# Patient Record
Sex: Female | Born: 1940 | Race: White | Hispanic: No | Marital: Married | Smoking: Former smoker
Health system: Southern US, Community
[De-identification: ages and names within clinical notes are randomized; demographics above are authoritative.]

## PROBLEM LIST (undated history)

## (undated) DIAGNOSIS — K5790 Diverticulosis of intestine, part unspecified, without perforation or abscess without bleeding: Secondary | ICD-10-CM

## (undated) DIAGNOSIS — J449 Chronic obstructive pulmonary disease, unspecified: Secondary | ICD-10-CM

## (undated) DIAGNOSIS — I2510 Atherosclerotic heart disease of native coronary artery without angina pectoris: Secondary | ICD-10-CM

## (undated) DIAGNOSIS — I10 Essential (primary) hypertension: Secondary | ICD-10-CM

## (undated) DIAGNOSIS — R911 Solitary pulmonary nodule: Secondary | ICD-10-CM

## (undated) HISTORY — DX: Essential (primary) hypertension: I10

## (undated) HISTORY — PX: CARDIAC CATHETERIZATION: SHX172

## (undated) HISTORY — PX: CORONARY ANGIOPLASTY: SHX604

## (undated) HISTORY — PX: FRACTURE SURGERY: SHX138

## (undated) HISTORY — DX: Solitary pulmonary nodule: R91.1

## (undated) HISTORY — PX: TONSILLECTOMY AND ADENOIDECTOMY: SUR1326

## (undated) HISTORY — DX: Diverticulosis of intestine, part unspecified, without perforation or abscess without bleeding: K57.90

---

## 2011-10-27 DIAGNOSIS — I10 Essential (primary) hypertension: Secondary | ICD-10-CM | POA: Diagnosis not present

## 2011-12-28 DIAGNOSIS — Z1231 Encounter for screening mammogram for malignant neoplasm of breast: Secondary | ICD-10-CM | POA: Diagnosis not present

## 2012-03-20 DIAGNOSIS — S199XXA Unspecified injury of neck, initial encounter: Secondary | ICD-10-CM | POA: Diagnosis not present

## 2012-03-20 DIAGNOSIS — S2239XA Fracture of one rib, unspecified side, initial encounter for closed fracture: Secondary | ICD-10-CM | POA: Diagnosis not present

## 2012-03-20 DIAGNOSIS — S2249XA Multiple fractures of ribs, unspecified side, initial encounter for closed fracture: Secondary | ICD-10-CM | POA: Diagnosis not present

## 2012-03-20 DIAGNOSIS — R079 Chest pain, unspecified: Secondary | ICD-10-CM | POA: Diagnosis not present

## 2012-03-20 DIAGNOSIS — J984 Other disorders of lung: Secondary | ICD-10-CM | POA: Diagnosis not present

## 2012-03-20 DIAGNOSIS — S0993XA Unspecified injury of face, initial encounter: Secondary | ICD-10-CM | POA: Diagnosis not present

## 2012-03-20 DIAGNOSIS — J9383 Other pneumothorax: Secondary | ICD-10-CM | POA: Diagnosis not present

## 2012-03-20 DIAGNOSIS — S0990XA Unspecified injury of head, initial encounter: Secondary | ICD-10-CM | POA: Diagnosis not present

## 2012-03-20 DIAGNOSIS — R911 Solitary pulmonary nodule: Secondary | ICD-10-CM | POA: Diagnosis not present

## 2012-03-20 DIAGNOSIS — S270XXA Traumatic pneumothorax, initial encounter: Secondary | ICD-10-CM | POA: Diagnosis not present

## 2012-03-20 DIAGNOSIS — T148XXA Other injury of unspecified body region, initial encounter: Secondary | ICD-10-CM | POA: Diagnosis not present

## 2012-03-20 DIAGNOSIS — T07XXXA Unspecified multiple injuries, initial encounter: Secondary | ICD-10-CM | POA: Diagnosis not present

## 2012-03-21 DIAGNOSIS — R918 Other nonspecific abnormal finding of lung field: Secondary | ICD-10-CM | POA: Diagnosis not present

## 2012-03-21 DIAGNOSIS — S2249XA Multiple fractures of ribs, unspecified side, initial encounter for closed fracture: Secondary | ICD-10-CM | POA: Diagnosis not present

## 2012-03-21 DIAGNOSIS — S6990XA Unspecified injury of unspecified wrist, hand and finger(s), initial encounter: Secondary | ICD-10-CM | POA: Diagnosis not present

## 2012-03-21 DIAGNOSIS — S270XXA Traumatic pneumothorax, initial encounter: Secondary | ICD-10-CM | POA: Diagnosis not present

## 2012-03-22 DIAGNOSIS — J90 Pleural effusion, not elsewhere classified: Secondary | ICD-10-CM | POA: Diagnosis not present

## 2012-03-22 DIAGNOSIS — J9383 Other pneumothorax: Secondary | ICD-10-CM | POA: Diagnosis not present

## 2012-03-22 DIAGNOSIS — S2239XA Fracture of one rib, unspecified side, initial encounter for closed fracture: Secondary | ICD-10-CM | POA: Diagnosis not present

## 2012-03-22 DIAGNOSIS — J189 Pneumonia, unspecified organism: Secondary | ICD-10-CM | POA: Diagnosis not present

## 2012-03-22 DIAGNOSIS — R918 Other nonspecific abnormal finding of lung field: Secondary | ICD-10-CM | POA: Diagnosis not present

## 2012-03-23 DIAGNOSIS — J9819 Other pulmonary collapse: Secondary | ICD-10-CM | POA: Diagnosis not present

## 2012-03-23 DIAGNOSIS — J90 Pleural effusion, not elsewhere classified: Secondary | ICD-10-CM | POA: Diagnosis not present

## 2012-03-24 DIAGNOSIS — J90 Pleural effusion, not elsewhere classified: Secondary | ICD-10-CM | POA: Diagnosis not present

## 2012-03-24 DIAGNOSIS — J9819 Other pulmonary collapse: Secondary | ICD-10-CM | POA: Diagnosis not present

## 2012-03-24 DIAGNOSIS — J13 Pneumonia due to Streptococcus pneumoniae: Secondary | ICD-10-CM | POA: Diagnosis not present

## 2012-03-25 DIAGNOSIS — J90 Pleural effusion, not elsewhere classified: Secondary | ICD-10-CM | POA: Diagnosis not present

## 2012-03-26 DIAGNOSIS — S2249XA Multiple fractures of ribs, unspecified side, initial encounter for closed fracture: Secondary | ICD-10-CM | POA: Diagnosis not present

## 2012-03-26 DIAGNOSIS — J90 Pleural effusion, not elsewhere classified: Secondary | ICD-10-CM | POA: Diagnosis not present

## 2012-03-29 DIAGNOSIS — J9819 Other pulmonary collapse: Secondary | ICD-10-CM | POA: Diagnosis not present

## 2012-03-29 DIAGNOSIS — J90 Pleural effusion, not elsewhere classified: Secondary | ICD-10-CM | POA: Diagnosis not present

## 2012-03-29 DIAGNOSIS — S2239XA Fracture of one rib, unspecified side, initial encounter for closed fracture: Secondary | ICD-10-CM | POA: Diagnosis not present

## 2012-03-29 DIAGNOSIS — J13 Pneumonia due to Streptococcus pneumoniae: Secondary | ICD-10-CM | POA: Diagnosis not present

## 2012-03-29 DIAGNOSIS — J9383 Other pneumothorax: Secondary | ICD-10-CM | POA: Diagnosis not present

## 2012-04-16 DIAGNOSIS — R911 Solitary pulmonary nodule: Secondary | ICD-10-CM | POA: Diagnosis not present

## 2012-04-16 DIAGNOSIS — R079 Chest pain, unspecified: Secondary | ICD-10-CM | POA: Diagnosis not present

## 2012-04-16 DIAGNOSIS — M25473 Effusion, unspecified ankle: Secondary | ICD-10-CM | POA: Diagnosis not present

## 2012-05-13 ENCOUNTER — Encounter (HOSPITAL_COMMUNITY): Payer: Self-pay

## 2012-05-13 ENCOUNTER — Encounter (HOSPITAL_COMMUNITY)
Admission: RE | Admit: 2012-05-13 | Discharge: 2012-05-13 | Disposition: A | Discharge status: Home / Self Care | Payer: Medicare Other | Source: Ambulatory Visit | Attending: Internal Medicine | Admitting: Internal Medicine

## 2012-05-13 DIAGNOSIS — I2510 Atherosclerotic heart disease of native coronary artery without angina pectoris: Secondary | ICD-10-CM | POA: Insufficient documentation

## 2012-05-13 DIAGNOSIS — Z5189 Encounter for other specified aftercare: Secondary | ICD-10-CM | POA: Diagnosis not present

## 2012-05-13 HISTORY — DX: Atherosclerotic heart disease of native coronary artery without angina pectoris: I25.10

## 2012-05-13 NOTE — Progress Notes (Signed)
Erin Li came in today for orientation to Pulmonary Rehab.  Health history and medications were reviewed with patient and her husband.  Goals were set for rehab. Demonstration and practice of PLB using pulse oximeter.  Patient able to return demonstration satisfactorily. Safety and hand hygiene in the exercise area reviewed with patient.  Patient voices understanding.  She has follow up visit with Dr. Michaell Li at [facility] in [facility] tomorrow and plans to start exercise on 05/16/12.

## 2012-05-14 ENCOUNTER — Encounter (HOSPITAL_COMMUNITY): Payer: Medicare Other

## 2012-05-14 DIAGNOSIS — S2249XA Multiple fractures of ribs, unspecified side, initial encounter for closed fracture: Secondary | ICD-10-CM | POA: Diagnosis not present

## 2012-05-14 DIAGNOSIS — F411 Generalized anxiety disorder: Secondary | ICD-10-CM | POA: Diagnosis not present

## 2012-05-14 DIAGNOSIS — J9383 Other pneumothorax: Secondary | ICD-10-CM | POA: Diagnosis not present

## 2012-05-14 DIAGNOSIS — S8010XA Contusion of unspecified lower leg, initial encounter: Secondary | ICD-10-CM | POA: Diagnosis not present

## 2012-05-16 ENCOUNTER — Encounter (HOSPITAL_COMMUNITY): Payer: Medicare Other

## 2012-05-16 ENCOUNTER — Encounter (HOSPITAL_COMMUNITY)
Admission: RE | Admit: 2012-05-16 | Discharge: 2012-05-16 | Disposition: A | Discharge status: Home / Self Care | Payer: Medicare Other | Source: Ambulatory Visit | Attending: Internal Medicine | Admitting: Internal Medicine

## 2012-05-16 DIAGNOSIS — Z5189 Encounter for other specified aftercare: Secondary | ICD-10-CM | POA: Diagnosis not present

## 2012-05-16 DIAGNOSIS — I2510 Atherosclerotic heart disease of native coronary artery without angina pectoris: Secondary | ICD-10-CM | POA: Diagnosis not present

## 2012-05-20 ENCOUNTER — Encounter (HOSPITAL_COMMUNITY): Payer: Medicare Other

## 2012-05-21 ENCOUNTER — Encounter (HOSPITAL_COMMUNITY): Payer: Medicare Other

## 2012-05-21 ENCOUNTER — Encounter (HOSPITAL_COMMUNITY)
Admission: RE | Admit: 2012-05-21 | Discharge: 2012-05-21 | Disposition: A | Discharge status: Home / Self Care | Payer: Medicare Other | Source: Ambulatory Visit | Attending: Internal Medicine | Admitting: Internal Medicine

## 2012-05-21 DIAGNOSIS — I2510 Atherosclerotic heart disease of native coronary artery without angina pectoris: Secondary | ICD-10-CM | POA: Diagnosis not present

## 2012-05-21 DIAGNOSIS — Z23 Encounter for immunization: Secondary | ICD-10-CM | POA: Diagnosis not present

## 2012-05-21 DIAGNOSIS — Z5189 Encounter for other specified aftercare: Secondary | ICD-10-CM | POA: Diagnosis not present

## 2012-05-21 NOTE — Progress Notes (Signed)
Exercised today on room air, tolerated well.  She does well with PLB, pacing her exercise. Saturations during exercise were 94-95%  She has been able to sleep in bed over the week end with multiple pillows.  States she has not awakened as much.  In very good spirits.

## 2012-05-23 ENCOUNTER — Encounter (HOSPITAL_COMMUNITY)
Admission: RE | Admit: 2012-05-23 | Discharge: 2012-05-23 | Disposition: A | Discharge status: Home / Self Care | Payer: Medicare Other | Source: Ambulatory Visit | Attending: Internal Medicine | Admitting: Internal Medicine

## 2012-05-23 DIAGNOSIS — I2510 Atherosclerotic heart disease of native coronary artery without angina pectoris: Secondary | ICD-10-CM | POA: Diagnosis not present

## 2012-05-23 DIAGNOSIS — Z5189 Encounter for other specified aftercare: Secondary | ICD-10-CM | POA: Diagnosis not present

## 2012-05-23 NOTE — Progress Notes (Signed)
Completed home exercise with patient. Reviewed exercise progression, routine, exercising at a comfortable pace, RPE/Dyspnea scales, how important it is to own a pulse oximeter and how to use one, weather conditions, warning signs and symptoms with exercise, and CP/NTG. We discussed when to call MD. Patient voices understanding. Patient has a goal to breathe better while doing ADLs. Will continue to encourage and support.  Zeriyah Wain L. Brown, MS, NASM, CES 

## 2012-05-27 ENCOUNTER — Encounter (HOSPITAL_COMMUNITY): Payer: Medicare Other

## 2012-05-28 ENCOUNTER — Encounter (HOSPITAL_COMMUNITY)
Admission: RE | Admit: 2012-05-28 | Discharge: 2012-05-28 | Disposition: A | Discharge status: Home / Self Care | Payer: Medicare Other | Source: Ambulatory Visit | Attending: Internal Medicine | Admitting: Internal Medicine

## 2012-05-28 DIAGNOSIS — Z5189 Encounter for other specified aftercare: Secondary | ICD-10-CM | POA: Diagnosis not present

## 2012-05-28 DIAGNOSIS — I2510 Atherosclerotic heart disease of native coronary artery without angina pectoris: Secondary | ICD-10-CM | POA: Diagnosis not present
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
## 2012-05-30 ENCOUNTER — Encounter (HOSPITAL_COMMUNITY)
Admission: RE | Admit: 2012-05-30 | Discharge: 2012-05-30 | Disposition: A | Discharge status: Home / Self Care | Payer: Medicare Other | Source: Ambulatory Visit | Attending: Internal Medicine | Admitting: Internal Medicine

## 2012-05-30 DIAGNOSIS — I2510 Atherosclerotic heart disease of native coronary artery without angina pectoris: Secondary | ICD-10-CM | POA: Diagnosis not present

## 2012-05-30 DIAGNOSIS — Z5189 Encounter for other specified aftercare: Secondary | ICD-10-CM | POA: Diagnosis not present

## 2012-06-03 ENCOUNTER — Encounter (HOSPITAL_COMMUNITY): Payer: Medicare Other

## 2012-06-04 ENCOUNTER — Encounter (HOSPITAL_COMMUNITY)
Admission: RE | Admit: 2012-06-04 | Discharge: 2012-06-04 | Disposition: A | Discharge status: Home / Self Care | Payer: Medicare Other | Source: Ambulatory Visit | Attending: Internal Medicine | Admitting: Internal Medicine

## 2012-06-04 DIAGNOSIS — Z5189 Encounter for other specified aftercare: Secondary | ICD-10-CM | POA: Diagnosis not present

## 2012-06-04 DIAGNOSIS — I2510 Atherosclerotic heart disease of native coronary artery without angina pectoris: Secondary | ICD-10-CM | POA: Diagnosis not present

## 2012-06-04 DIAGNOSIS — S2239XA Fracture of one rib, unspecified side, initial encounter for closed fracture: Secondary | ICD-10-CM | POA: Diagnosis not present
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
## 2012-06-06 ENCOUNTER — Encounter (HOSPITAL_COMMUNITY)
Admission: RE | Admit: 2012-06-06 | Discharge: 2012-06-06 | Disposition: A | Discharge status: Home / Self Care | Payer: Medicare Other | Source: Ambulatory Visit | Attending: Internal Medicine | Admitting: Internal Medicine

## 2012-06-10 ENCOUNTER — Encounter (HOSPITAL_COMMUNITY): Payer: Medicare Other

## 2012-06-11 ENCOUNTER — Encounter (HOSPITAL_COMMUNITY)
Admission: RE | Admit: 2012-06-11 | Discharge: 2012-06-11 | Disposition: A | Discharge status: Home / Self Care | Payer: Medicare Other | Source: Ambulatory Visit | Attending: Internal Medicine | Admitting: Internal Medicine

## 2012-06-13 ENCOUNTER — Encounter (HOSPITAL_COMMUNITY)
Admission: RE | Admit: 2012-06-13 | Discharge: 2012-06-13 | Disposition: A | Discharge status: Home / Self Care | Payer: Medicare Other | Source: Ambulatory Visit | Attending: Internal Medicine | Admitting: Internal Medicine

## 2012-06-17 ENCOUNTER — Encounter (HOSPITAL_COMMUNITY): Payer: Medicare Other

## 2012-06-18 ENCOUNTER — Encounter (HOSPITAL_COMMUNITY)
Admission: RE | Admit: 2012-06-18 | Discharge: 2012-06-18 | Disposition: A | Discharge status: Home / Self Care | Payer: Medicare Other | Source: Ambulatory Visit | Attending: Internal Medicine | Admitting: Internal Medicine

## 2012-06-20 ENCOUNTER — Encounter (HOSPITAL_COMMUNITY)
Admission: RE | Admit: 2012-06-20 | Discharge: 2012-06-20 | Disposition: A | Discharge status: Home / Self Care | Payer: Medicare Other | Source: Ambulatory Visit | Attending: Internal Medicine | Admitting: Internal Medicine

## 2012-06-24 ENCOUNTER — Encounter (HOSPITAL_COMMUNITY): Payer: Medicare Other

## 2012-06-24 DIAGNOSIS — I10 Essential (primary) hypertension: Secondary | ICD-10-CM | POA: Diagnosis not present

## 2012-06-24 DIAGNOSIS — R918 Other nonspecific abnormal finding of lung field: Secondary | ICD-10-CM | POA: Diagnosis not present

## 2012-06-24 DIAGNOSIS — D649 Anemia, unspecified: Secondary | ICD-10-CM | POA: Diagnosis not present

## 2012-06-24 DIAGNOSIS — Z0000 Encounter for general adult medical examination without abnormal findings: Secondary | ICD-10-CM | POA: Diagnosis not present

## 2012-06-24 DIAGNOSIS — J449 Chronic obstructive pulmonary disease, unspecified: Secondary | ICD-10-CM | POA: Diagnosis not present

## 2012-06-25 ENCOUNTER — Encounter (HOSPITAL_COMMUNITY)
Admission: RE | Admit: 2012-06-25 | Discharge: 2012-06-25 | Disposition: A | Discharge status: Home / Self Care | Payer: Medicare Other | Source: Ambulatory Visit | Attending: Internal Medicine | Admitting: Internal Medicine

## 2012-06-27 ENCOUNTER — Encounter (HOSPITAL_COMMUNITY)
Admission: RE | Admit: 2012-06-27 | Discharge: 2012-06-27 | Disposition: A | Discharge status: Home / Self Care | Payer: Medicare Other | Source: Ambulatory Visit | Attending: Internal Medicine | Admitting: Internal Medicine

## 2012-07-01 ENCOUNTER — Encounter (HOSPITAL_COMMUNITY): Payer: Medicare Other

## 2012-07-02 ENCOUNTER — Encounter (HOSPITAL_COMMUNITY)
Admission: RE | Admit: 2012-07-02 | Discharge: 2012-07-02 | Disposition: A | Discharge status: Home / Self Care | Payer: Medicare Other | Source: Ambulatory Visit | Attending: Internal Medicine | Admitting: Internal Medicine

## 2012-07-04 ENCOUNTER — Encounter (HOSPITAL_COMMUNITY): Payer: Medicare Other

## 2012-07-05 ENCOUNTER — Institutional Professional Consult (permissible substitution): Payer: PRIVATE HEALTH INSURANCE | Admitting: Internal Medicine

## 2012-07-08 ENCOUNTER — Encounter (HOSPITAL_COMMUNITY): Payer: Medicare Other

## 2012-07-09 ENCOUNTER — Encounter (HOSPITAL_COMMUNITY)
Admission: RE | Admit: 2012-07-09 | Discharge: 2012-07-09 | Disposition: A | Discharge status: Home / Self Care | Payer: Medicare Other | Source: Ambulatory Visit | Attending: Internal Medicine | Admitting: Internal Medicine

## 2012-07-09 DIAGNOSIS — I2510 Atherosclerotic heart disease of native coronary artery without angina pectoris: Secondary | ICD-10-CM | POA: Diagnosis not present

## 2012-07-09 DIAGNOSIS — Z5189 Encounter for other specified aftercare: Secondary | ICD-10-CM | POA: Insufficient documentation

## 2012-07-11 ENCOUNTER — Encounter (HOSPITAL_COMMUNITY)
Admission: RE | Admit: 2012-07-11 | Discharge: 2012-07-11 | Disposition: A | Discharge status: Home / Self Care | Payer: Medicare Other | Source: Ambulatory Visit | Attending: Internal Medicine | Admitting: Internal Medicine

## 2012-07-12 ENCOUNTER — Ambulatory Visit (INDEPENDENT_AMBULATORY_CARE_PROVIDER_SITE_OTHER): Payer: Medicare Other | Admitting: Internal Medicine

## 2012-07-12 ENCOUNTER — Encounter: Payer: Self-pay | Admitting: *Deleted

## 2012-07-12 VITALS — BP 106/72 | HR 66 | Temp 97.6°F | Ht 66.0 in | Wt 136.6 lb

## 2012-07-12 DIAGNOSIS — R911 Solitary pulmonary nodule: Secondary | ICD-10-CM | POA: Diagnosis not present

## 2012-07-12 DIAGNOSIS — Z87891 Personal history of nicotine dependence: Secondary | ICD-10-CM | POA: Diagnosis not present
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
## 2012-07-12 NOTE — Patient Instructions (Addendum)
#  lung nodule #hx of smoking  - Please have breathing test and ct scan mid jan 2014 and return for followup

## 2012-07-14 ENCOUNTER — Encounter: Payer: Self-pay | Admitting: Internal Medicine

## 2012-07-14 DIAGNOSIS — Z87891 Personal history of nicotine dependence: Secondary | ICD-10-CM | POA: Insufficient documentation

## 2012-07-14 DIAGNOSIS — R911 Solitary pulmonary nodule: Secondary | ICD-10-CM | POA: Insufficient documentation
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
## 2012-07-14 NOTE — Assessment & Plan Note (Signed)
Check pft jan 2014 (CT July 2013 at [facility] reports emphysema). SHe does not have much symptoms. Mightneed alpha 1 check at fu

## 2012-07-15 ENCOUNTER — Encounter (HOSPITAL_COMMUNITY): Payer: Medicare Other

## 2012-07-16 ENCOUNTER — Encounter (HOSPITAL_COMMUNITY)
Admission: RE | Admit: 2012-07-16 | Discharge: 2012-07-16 | Disposition: A | Discharge status: Home / Self Care | Payer: Medicare Other | Source: Ambulatory Visit | Attending: Internal Medicine | Admitting: Internal Medicine

## 2012-07-17 DIAGNOSIS — Z0000 Encounter for general adult medical examination without abnormal findings: Secondary | ICD-10-CM | POA: Diagnosis not present

## 2012-07-17 DIAGNOSIS — I10 Essential (primary) hypertension: Secondary | ICD-10-CM | POA: Diagnosis not present

## 2012-07-17 DIAGNOSIS — D7589 Other specified diseases of blood and blood-forming organs: Secondary | ICD-10-CM | POA: Diagnosis not present

## 2012-07-17 DIAGNOSIS — F411 Generalized anxiety disorder: Secondary | ICD-10-CM | POA: Diagnosis not present

## 2012-07-18 ENCOUNTER — Encounter (HOSPITAL_COMMUNITY)
Admission: RE | Admit: 2012-07-18 | Discharge: 2012-07-18 | Disposition: A | Discharge status: Home / Self Care | Payer: Medicare Other | Source: Ambulatory Visit | Attending: Internal Medicine | Admitting: Internal Medicine

## 2012-07-22 ENCOUNTER — Encounter (HOSPITAL_COMMUNITY): Payer: Medicare Other

## 2012-07-22 DIAGNOSIS — I10 Essential (primary) hypertension: Secondary | ICD-10-CM | POA: Diagnosis not present

## 2012-07-22 DIAGNOSIS — F411 Generalized anxiety disorder: Secondary | ICD-10-CM | POA: Diagnosis not present

## 2012-07-22 DIAGNOSIS — M810 Age-related osteoporosis without current pathological fracture: Secondary | ICD-10-CM | POA: Diagnosis not present

## 2012-07-22 DIAGNOSIS — D7589 Other specified diseases of blood and blood-forming organs: Secondary | ICD-10-CM | POA: Diagnosis not present

## 2012-07-22 DIAGNOSIS — L659 Nonscarring hair loss, unspecified: Secondary | ICD-10-CM | POA: Diagnosis not present

## 2012-07-23 ENCOUNTER — Encounter (HOSPITAL_COMMUNITY)
Admission: RE | Admit: 2012-07-23 | Discharge: 2012-07-23 | Disposition: A | Discharge status: Home / Self Care | Payer: Medicare Other | Source: Ambulatory Visit | Attending: Internal Medicine | Admitting: Internal Medicine

## 2012-07-25 ENCOUNTER — Encounter (HOSPITAL_COMMUNITY)
Admission: RE | Admit: 2012-07-25 | Discharge: 2012-07-25 | Disposition: A | Discharge status: Home / Self Care | Payer: Medicare Other | Source: Ambulatory Visit | Attending: Internal Medicine | Admitting: Internal Medicine

## 2012-07-30 ENCOUNTER — Encounter (HOSPITAL_COMMUNITY)
Admission: RE | Admit: 2012-07-30 | Discharge: 2012-07-30 | Disposition: A | Discharge status: Home / Self Care | Payer: Medicare Other | Source: Ambulatory Visit | Attending: Internal Medicine | Admitting: Internal Medicine

## 2012-08-01 ENCOUNTER — Encounter (HOSPITAL_COMMUNITY): Payer: Medicare Other

## 2012-08-06 ENCOUNTER — Encounter (HOSPITAL_COMMUNITY)
Admission: RE | Admit: 2012-08-06 | Discharge: 2012-08-06 | Disposition: A | Discharge status: Home / Self Care | Payer: Medicare Other | Source: Ambulatory Visit | Attending: Internal Medicine | Admitting: Internal Medicine

## 2012-08-06 DIAGNOSIS — Z5189 Encounter for other specified aftercare: Secondary | ICD-10-CM | POA: Diagnosis not present

## 2012-08-06 DIAGNOSIS — I2510 Atherosclerotic heart disease of native coronary artery without angina pectoris: Secondary | ICD-10-CM | POA: Insufficient documentation

## 2012-08-08 ENCOUNTER — Encounter (HOSPITAL_COMMUNITY)
Admission: RE | Admit: 2012-08-08 | Discharge: 2012-08-08 | Disposition: A | Discharge status: Home / Self Care | Payer: Medicare Other | Source: Ambulatory Visit | Attending: Internal Medicine | Admitting: Internal Medicine

## 2012-08-13 ENCOUNTER — Encounter (HOSPITAL_COMMUNITY): Payer: Medicare Other

## 2012-08-15 ENCOUNTER — Encounter (HOSPITAL_COMMUNITY)
Admission: RE | Admit: 2012-08-15 | Discharge: 2012-08-15 | Disposition: A | Discharge status: Home / Self Care | Payer: Medicare Other | Source: Ambulatory Visit | Attending: Internal Medicine | Admitting: Internal Medicine

## 2012-08-20 ENCOUNTER — Encounter (HOSPITAL_COMMUNITY)
Admission: RE | Admit: 2012-08-20 | Discharge: 2012-08-20 | Disposition: A | Discharge status: Home / Self Care | Payer: Medicare Other | Source: Ambulatory Visit | Attending: Internal Medicine | Admitting: Internal Medicine

## 2012-08-22 ENCOUNTER — Encounter (HOSPITAL_COMMUNITY)
Admission: RE | Admit: 2012-08-22 | Discharge: 2012-08-22 | Disposition: A | Discharge status: Home / Self Care | Payer: Medicare Other | Source: Ambulatory Visit | Attending: Internal Medicine | Admitting: Internal Medicine

## 2012-08-27 ENCOUNTER — Encounter (HOSPITAL_COMMUNITY): Payer: Medicare Other

## 2012-08-29 ENCOUNTER — Encounter (HOSPITAL_COMMUNITY)
Admission: RE | Admit: 2012-08-29 | Discharge: 2012-08-29 | Disposition: A | Discharge status: Home / Self Care | Payer: Medicare Other | Source: Ambulatory Visit | Attending: Internal Medicine | Admitting: Internal Medicine

## 2012-09-03 ENCOUNTER — Encounter (HOSPITAL_COMMUNITY)
Admission: RE | Admit: 2012-09-03 | Discharge: 2012-09-03 | Payer: Medicare Other | Source: Ambulatory Visit | Attending: Internal Medicine | Admitting: Internal Medicine

## 2012-09-03 NOTE — Progress Notes (Signed)
Today was Erin Li last day in the Pulmonary Maintenance Rehab program. Patient completed the [facility] program and December an switched right over to the maintenance program. Patient only wanted one more month to add onto the 3 she did in undergrad to make sure she was completely ready to go back to the gym. Patient plans to continue home exercises at the gym with her husband. Patients anxiety levels decreased tremendously with this program and she started out on oxygen and did so well she was able to come off the oxygen in the [facility] program. Patient understands that if she needs to return she may with a new MD referral. I feel she will do well on her own. Very delightful lady.

## 2012-09-05 ENCOUNTER — Encounter (HOSPITAL_COMMUNITY): Payer: PRIVATE HEALTH INSURANCE

## 2012-09-10 ENCOUNTER — Encounter (HOSPITAL_COMMUNITY): Payer: PRIVATE HEALTH INSURANCE

## 2012-09-11 ENCOUNTER — Inpatient Hospital Stay: Admission: RE | Admit: 2012-09-11 | Payer: Medicare Other | Source: Ambulatory Visit

## 2012-09-12 ENCOUNTER — Encounter (HOSPITAL_COMMUNITY): Payer: PRIVATE HEALTH INSURANCE

## 2012-09-13 ENCOUNTER — Ambulatory Visit (INDEPENDENT_AMBULATORY_CARE_PROVIDER_SITE_OTHER)
Admission: RE | Admit: 2012-09-13 | Discharge: 2012-09-13 | Disposition: A | Discharge status: Home / Self Care | Payer: Medicare Other | Source: Ambulatory Visit | Attending: Internal Medicine | Admitting: Internal Medicine

## 2012-09-13 DIAGNOSIS — I2510 Atherosclerotic heart disease of native coronary artery without angina pectoris: Secondary | ICD-10-CM | POA: Diagnosis not present

## 2012-09-13 DIAGNOSIS — R911 Solitary pulmonary nodule: Secondary | ICD-10-CM | POA: Diagnosis not present

## 2012-09-13 DIAGNOSIS — J479 Bronchiectasis, uncomplicated: Secondary | ICD-10-CM | POA: Diagnosis not present

## 2012-09-13 DIAGNOSIS — J438 Other emphysema: Secondary | ICD-10-CM | POA: Diagnosis not present

## 2012-09-17 ENCOUNTER — Encounter (HOSPITAL_COMMUNITY): Payer: PRIVATE HEALTH INSURANCE

## 2012-09-17 NOTE — Progress Notes (Signed)
Nutrition Outcomes Note There is some room for improvement for pt diet to become healthier.  Pt wt maintained during rehab.  Wt loss not desired. BMI 23.0, % body fat 32.5%. Pt with 3.56%  decrease in % body fat. Section Completed by: Mickle Plumb, M.Ed, RD, LDN, CDE

## 2012-09-19 ENCOUNTER — Encounter (HOSPITAL_COMMUNITY): Payer: PRIVATE HEALTH INSURANCE

## 2012-09-24 ENCOUNTER — Telehealth: Payer: Self-pay | Admitting: *Deleted

## 2012-09-24 ENCOUNTER — Encounter: Payer: Self-pay | Admitting: Internal Medicine

## 2012-09-24 ENCOUNTER — Other Ambulatory Visit: Payer: Medicare Other

## 2012-09-24 ENCOUNTER — Ambulatory Visit (INDEPENDENT_AMBULATORY_CARE_PROVIDER_SITE_OTHER): Payer: Medicare Other | Admitting: Internal Medicine

## 2012-09-24 ENCOUNTER — Encounter (HOSPITAL_COMMUNITY): Payer: PRIVATE HEALTH INSURANCE

## 2012-09-24 VITALS — BP 112/74 | HR 83 | Temp 97.7°F | Ht 66.0 in | Wt 144.0 lb

## 2012-09-24 DIAGNOSIS — J449 Chronic obstructive pulmonary disease, unspecified: Secondary | ICD-10-CM

## 2012-09-24 DIAGNOSIS — Z23 Encounter for immunization: Secondary | ICD-10-CM | POA: Diagnosis not present

## 2012-09-24 DIAGNOSIS — R911 Solitary pulmonary nodule: Secondary | ICD-10-CM

## 2012-09-24 DIAGNOSIS — J4489 Other specified chronic obstructive pulmonary disease: Secondary | ICD-10-CM | POA: Diagnosis not present

## 2012-09-24 DIAGNOSIS — Z87891 Personal history of nicotine dependence: Secondary | ICD-10-CM | POA: Diagnosis not present

## 2012-09-24 MED ORDER — TIOTROPIUM BROMIDE MONOHYDRATE 18 MCG IN CAPS: 18.0000 ug | ORAL_CAPSULE | Freq: Every day | RESPIRATORY_TRACT | Status: DC

## 2012-09-24 NOTE — Patient Instructions (Addendum)
#  COPD  - you have severe copd; lung function at 39% normal capacity - because you quit smoking we do not think it will worsen further - Please start spiriva 1 puff daily - take script and show technique - use ventolin as needed - glad you had flu shot - have pneumovax 09/24/2012 ; because last shot was supposedly > 5 years ago  - do blood test for alpha  1 genetic test for copd; will call with results  - glad you attended pulmonary rehab  - do exercises at [facility] both aerobic and resistance (kettlebell swing is a good exercise, look it up in youtube)  #Lung nodule - will have [facility] blood test folks call you for blood test  - stable 8mm nodule July 2013 -> Jan 2014; low - intermediate odds for lung cancer but needs continued followup for 2 years  - next CT chest without contrat - August 2014  #Followup - august 2014 with CT chest  - return sooner if needed esp for bronchitis episodes for which  You need to call soon
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
## 2012-09-25 DIAGNOSIS — J449 Chronic obstructive pulmonary disease, unspecified: Secondary | ICD-10-CM | POA: Insufficient documentation

## 2012-09-25 DIAGNOSIS — J441 Chronic obstructive pulmonary disease with (acute) exacerbation: Secondary | ICD-10-CM | POA: Insufficient documentation
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
## 2012-09-25 NOTE — Assessment & Plan Note (Addendum)
Gold stage 3 copd diagnosed - Jan 2014 CAT score 5 on Jan 2014  PLAN #COPD  - you have severe copd; lung function at 39% normal capacity - because you quit smoking we do not think it will worsen further - Please start spiriva 1 puff daily - take script and show technique - use ventolin as needed - glad you had flu shot - have pneumovax 09/24/2012 ; because last shot was supposedly > 5 years ago  - do blood test for alpha  1 genetic test for copd; will call with results  - glad you attended pulmonary rehab  - do exercises at [facility] both aerobic and resistance (kettlebell swing is a good exercise, look it up in youtube)  #Followup - august 2014 with CT chest  - return sooner if needed esp for bronchitis episodes for which  You need to call soon  > 50% of this > 25 min visit spent in face to face counseling (15 min visit converted to 25 min)

## 2012-09-26 ENCOUNTER — Encounter (HOSPITAL_COMMUNITY): Payer: PRIVATE HEALTH INSURANCE

## 2012-09-26 DIAGNOSIS — J449 Chronic obstructive pulmonary disease, unspecified: Secondary | ICD-10-CM | POA: Diagnosis not present

## 2012-09-26 DIAGNOSIS — R911 Solitary pulmonary nodule: Secondary | ICD-10-CM | POA: Diagnosis not present

## 2012-09-30 LAB — ALPHA-1 ANTITRYPSIN PHENOTYPE: A-1 Antitrypsin: 124 mg/dL (ref 83–199)

## 2012-10-01 ENCOUNTER — Encounter (HOSPITAL_COMMUNITY): Payer: PRIVATE HEALTH INSURANCE

## 2012-10-02 ENCOUNTER — Other Ambulatory Visit: Payer: Self-pay | Admitting: *Deleted

## 2012-10-02 MED ORDER — TIOTROPIUM BROMIDE MONOHYDRATE 18 MCG IN CAPS: 18.0000 ug | ORAL_CAPSULE | Freq: Every day | RESPIRATORY_TRACT | Status: DC

## 2012-10-03 ENCOUNTER — Encounter (HOSPITAL_COMMUNITY): Payer: PRIVATE HEALTH INSURANCE

## 2012-10-08 ENCOUNTER — Encounter (HOSPITAL_COMMUNITY): Payer: PRIVATE HEALTH INSURANCE

## 2012-10-10 ENCOUNTER — Encounter (HOSPITAL_COMMUNITY): Payer: PRIVATE HEALTH INSURANCE

## 2012-10-15 ENCOUNTER — Encounter (HOSPITAL_COMMUNITY): Payer: PRIVATE HEALTH INSURANCE

## 2012-10-17 ENCOUNTER — Encounter (HOSPITAL_COMMUNITY): Payer: PRIVATE HEALTH INSURANCE

## 2012-10-22 ENCOUNTER — Encounter (HOSPITAL_COMMUNITY): Payer: PRIVATE HEALTH INSURANCE

## 2012-10-24 ENCOUNTER — Encounter (HOSPITAL_COMMUNITY): Payer: PRIVATE HEALTH INSURANCE

## 2012-10-28 ENCOUNTER — Telehealth: Payer: Self-pay | Admitting: Internal Medicine

## 2012-10-28 DIAGNOSIS — L259 Unspecified contact dermatitis, unspecified cause: Secondary | ICD-10-CM | POA: Diagnosis not present

## 2012-10-28 DIAGNOSIS — L650 Telogen effluvium: Secondary | ICD-10-CM | POA: Diagnosis not present

## 2012-10-28 DIAGNOSIS — L659 Nonscarring hair loss, unspecified: Secondary | ICD-10-CM | POA: Diagnosis not present

## 2012-10-28 DIAGNOSIS — B079 Viral wart, unspecified: Secondary | ICD-10-CM | POA: Diagnosis not present

## 2012-10-28 NOTE — Telephone Encounter (Signed)
Spoke with Rosanne Ashing He states that the turnaround time for lab was a little longer so he wanted to call and give verbal results and will fax hard copy tomorrow The result was- likely benign with 85% probability Will forward to MR so he is aware

## 2012-10-29 ENCOUNTER — Encounter (HOSPITAL_COMMUNITY): Payer: PRIVATE HEALTH INSURANCE

## 2012-10-31 ENCOUNTER — Encounter (HOSPITAL_COMMUNITY): Payer: PRIVATE HEALTH INSURANCE

## 2012-11-05 ENCOUNTER — Encounter (HOSPITAL_COMMUNITY): Payer: PRIVATE HEALTH INSURANCE

## 2012-11-05 NOTE — Telephone Encounter (Signed)
MR and Jenn, have you seen the faxed results yet?

## 2012-11-05 NOTE — Telephone Encounter (Signed)
Results in MR look-at. Erin Li, CMA

## 2012-11-07 ENCOUNTER — Encounter (HOSPITAL_COMMUNITY): Payer: PRIVATE HEALTH INSURANCE

## 2012-11-07 NOTE — Telephone Encounter (Signed)
Let her know that blood test for lung cancer protein express lung dated 09/27/2012 suggest that this nodule is most likely benign and not a lung cancer. The confidence level is 84%. Therefore, no change in plan we'll continue to follow. Ensure  followup scan present as per prior office visit. If she has any questions or is worried about the confidence level is not higher then please let me know and I will talk to her

## 2012-11-11 ENCOUNTER — Telehealth: Payer: Self-pay | Admitting: Internal Medicine

## 2012-11-11 NOTE — Telephone Encounter (Signed)
Erin Shan, MD at 11/07/2012 6:22 PM   Status: Signed            Let her know that blood test for lung cancer protein express lung dated 09/27/2012 suggest that this nodule is most likely benign and not a lung cancer. The confidence level is 84%. Therefore, no change in plan we'll continue to follow. Ensure followup scan present as per prior office visit. If she has any questions or is worried about the confidence level is not higher then please let me know and I will talk to her

## 2012-11-11 NOTE — Telephone Encounter (Signed)
Pt advised. Jennifer Castillo, CMA  

## 2012-11-11 NOTE — Telephone Encounter (Signed)
Spoke with patient.  Patient states she is returning phone call from [place]. I cant find anything in chart indicating a call was attempted. Victorino Dike do you have/need anything for patient?

## 2012-11-11 NOTE — Telephone Encounter (Signed)
[email or web address].Jennifer Castillo, CMA  

## 2012-11-12 ENCOUNTER — Encounter (HOSPITAL_COMMUNITY): Payer: PRIVATE HEALTH INSURANCE

## 2012-11-14 ENCOUNTER — Encounter (HOSPITAL_COMMUNITY): Payer: PRIVATE HEALTH INSURANCE

## 2012-11-19 ENCOUNTER — Encounter (HOSPITAL_COMMUNITY): Payer: PRIVATE HEALTH INSURANCE

## 2012-11-21 ENCOUNTER — Encounter (HOSPITAL_COMMUNITY): Payer: PRIVATE HEALTH INSURANCE

## 2012-11-26 ENCOUNTER — Encounter (HOSPITAL_COMMUNITY): Payer: PRIVATE HEALTH INSURANCE

## 2012-12-11 ENCOUNTER — Ambulatory Visit (INDEPENDENT_AMBULATORY_CARE_PROVIDER_SITE_OTHER): Payer: Medicare Other | Admitting: Pulmonary Disease

## 2012-12-11 ENCOUNTER — Telehealth: Payer: Self-pay | Admitting: Internal Medicine

## 2012-12-11 ENCOUNTER — Encounter: Payer: Self-pay | Admitting: Pulmonary Disease

## 2012-12-11 VITALS — BP 124/66 | HR 72 | Temp 97.9°F | Ht 66.0 in | Wt 155.2 lb

## 2012-12-11 DIAGNOSIS — J441 Chronic obstructive pulmonary disease with (acute) exacerbation: Secondary | ICD-10-CM | POA: Insufficient documentation

## 2012-12-11 MED ORDER — CEFDINIR 300 MG PO CAPS: 600.0000 mg | ORAL_CAPSULE | ORAL | Status: DC

## 2012-12-11 MED ORDER — PREDNISONE 10 MG PO TABS: ORAL_TABLET | ORAL | Status: DC

## 2012-12-11 MED ORDER — HYDROCODONE-HOMATROPINE 5-1.5 MG/5ML PO SYRP: 5.0000 mL | ORAL_SOLUTION | Freq: Four times a day (QID) | ORAL | Status: DC | PRN

## 2012-12-11 NOTE — Telephone Encounter (Signed)
I spoke with pt. She is scheduled to come in and see [facility] today at 11:45 since he had an opening. Nothing further was needed

## 2012-12-11 NOTE — Assessment & Plan Note (Signed)
The patient's history is most consistent with an acute COPD exacerbation, probably related to either viral or bacterial acute bronchitis.  She is in no respiratory distress, but does have active wheezing on exam.  We'll therefore treat her with a course of antibiotics, as well as prednisone.  She will followup with her primary pulmonologist, but call us if she does not improve.

## 2012-12-11 NOTE — Progress Notes (Signed)
  Subjective:    Patient ID: Erin Li, female    DOB: 1940/12/12, 72 y.o.   MRN: [id number]  HPI Patient comes in today for an acute sick visit.  She has known significant COPD, and gives a three-day history of worsening chest congestion, cough with purulent because, as well as increased wheezing.  She has not had a significant worsening of her breathing from baseline.  She denies any fevers or chest congestion.   Review of Systems  Constitutional: Negative for fever and unexpected weight change.  HENT: Positive for sore throat ( from coughing). Negative for ear pain, nosebleeds, congestion, rhinorrhea, sneezing, trouble swallowing, dental problem, postnasal drip and sinus pressure.   Eyes: Negative for redness and itching.  Respiratory: Positive for cough and wheezing. Negative for chest tightness and shortness of breath.   Cardiovascular: Negative for palpitations and leg swelling.  Gastrointestinal: Negative for nausea and vomiting.  Genitourinary: Negative for dysuria.  Musculoskeletal: Negative for joint swelling.  Skin: Negative for rash.  Neurological: Negative for headaches.  Hematological: Does not bruise/bleed easily.  Psychiatric/Behavioral: Negative for dysphoric mood. The patient is not nervous/anxious.        Objective:   Physical Exam Well-developed female in no acute distress Nose without purulence or discharge noted Oropharynx clear Neck without lymphadenopathy or thyromegaly Chest with mild expiratory wheezes, adequate air flow, no crackles Cardiac exam with regular rate and rhythm Lower extremities without edema, no cyanosis Alert and oriented, moves all 4 extremities.       Assessment & Plan:

## 2012-12-11 NOTE — Addendum Note (Signed)
Addended by: Nita Sells on: 12/11/2012 11:52 AM   Modules accepted: Orders

## 2012-12-11 NOTE — Patient Instructions (Addendum)
Will treat you with omnicef 300mg   2 each am for 5 days. Prednisone taper as directed. Can use hydromet cough syrup one teaspoon every 6hrs if needed.  Keep followup with Dr. Marchelle Gearing, but call us if you are not improving.

## 2012-12-18 ENCOUNTER — Encounter: Payer: Self-pay | Admitting: Internal Medicine

## 2012-12-31 ENCOUNTER — Telehealth: Payer: Self-pay | Admitting: Internal Medicine

## 2012-12-31 MED ORDER — LEVOFLOXACIN 500 MG PO TABS: 500.0000 mg | ORAL_TABLET | Freq: Every day | ORAL | Status: DC

## 2012-12-31 MED ORDER — PREDNISONE 10 MG PO TABS: ORAL_TABLET | ORAL | Status: DC

## 2012-12-31 NOTE — Telephone Encounter (Signed)
Spoke with pt and given Dr Jane Canary recommendations and also that rx were sent to pharmacy to keep on hand for upcoming trip.

## 2012-12-31 NOTE — Telephone Encounter (Signed)
I spoke with pt. She saw KC on 12/11/12. Was giving an abx, pred taper and cough syrup. She stated she just got back from [place] and she is now coughing up slight white phlem. Denies any wheezing, no chest tx, no nasal congestion, no PND. She is wanting to know if this is normal. Please advise  Also she is going to [place] in June for 3 weeks and she is requesting to take rx's on hand for abx, prednisone taper, and cough syrup with her as well. Please advise MR thanks  No Known Allergies

## 2012-12-31 NOTE — Telephone Encounter (Signed)
lmomtcb x1 

## 2012-12-31 NOTE — Telephone Encounter (Signed)
Current symptoms: she is to keep an eye on her cough and white phelgm. IF this worsens orshe develops dyspnea, chest tightness, wheezing then call in anytime and we rx for aecood. She can use mucinex otc for now  For [place] trip: send Please take Take prednisone 40mg  once daily x 3 days, then 30mg  once daily x 3 days, then 20mg  once daily x 3 days, then prednisone 10mg  once daily  x 3 days and stop and  take levaquin 500mg  once daily  X 6 days to keep handy. She can use OTC cough syrup or mucinex otc

## 2013-01-13 ENCOUNTER — Telehealth: Payer: Self-pay | Admitting: Internal Medicine

## 2013-01-13 ENCOUNTER — Ambulatory Visit (INDEPENDENT_AMBULATORY_CARE_PROVIDER_SITE_OTHER)
Admission: RE | Admit: 2013-01-13 | Discharge: 2013-01-13 | Disposition: A | Discharge status: Home / Self Care | Payer: Medicare Other | Source: Ambulatory Visit | Attending: Pulmonary Disease | Admitting: Pulmonary Disease

## 2013-01-13 ENCOUNTER — Ambulatory Visit (INDEPENDENT_AMBULATORY_CARE_PROVIDER_SITE_OTHER): Payer: Medicare Other | Admitting: Pulmonary Disease

## 2013-01-13 ENCOUNTER — Encounter: Payer: Self-pay | Admitting: Pulmonary Disease

## 2013-01-13 VITALS — BP 118/70 | HR 71 | Temp 97.7°F | Ht 66.0 in | Wt 148.8 lb

## 2013-01-13 DIAGNOSIS — R059 Cough, unspecified: Secondary | ICD-10-CM | POA: Insufficient documentation

## 2013-01-13 DIAGNOSIS — R05 Cough: Secondary | ICD-10-CM

## 2013-01-13 DIAGNOSIS — J329 Chronic sinusitis, unspecified: Secondary | ICD-10-CM | POA: Diagnosis not present

## 2013-01-13 NOTE — Telephone Encounter (Signed)
Called, spoke with pt.  States she caught a cold about 1 wk ago.  States this cleared up but is still coughing.  Cough worse qhs.  Cough is prod with gray mucus yesterday and the day before.  Mucus clear this am.  Does have some wheezing but no increased SOB, chest tightness, chest pain, or f/c/s.  She is taking mucinex and using a cough syrup she made that consists of honey, cayan peper, ginger, cider vinegar, and water.  This is helping some.  Requesting further recs on cough.  OV scheduled today with KC at 3:30 pm.  Pt aware.

## 2013-01-13 NOTE — Progress Notes (Signed)
  Subjective:    Patient ID: Erin Li, female    DOB: February 13, 1941, 72 y.o.   MRN: [id number]  HPI The patient comes in today for an acute sick visit.  She is normally followed by Dr. Marchelle Gearing for COPD, and I saw her the beginning of April for probable acute bronchitis with COPD exacerbation.  She was treated with [facility] and a course of prednisone with improvement.  She has done adequately, but now has a one-week history of increasing head congestion, nasal congestion, stuffiness in her ears, and a significant cough with mucus production.  The mucus is gray at times and white at others, but has not been purulent.  She has had audible wheezing noted, but no increased shortness of breath.  She has not had any fever.   Review of Systems  Constitutional: Negative for fever and unexpected weight change.  HENT: Positive for congestion and voice change. Negative for ear pain, nosebleeds, sore throat, rhinorrhea, sneezing, trouble swallowing, dental problem, postnasal drip and sinus pressure.   Eyes: Negative for redness and itching.  Respiratory: Positive for cough, shortness of breath and wheezing. Negative for chest tightness.   Cardiovascular: Negative for palpitations and leg swelling.  Gastrointestinal: Negative for nausea and vomiting.  Genitourinary: Negative for dysuria.  Musculoskeletal: Negative for joint swelling.  Skin: Negative for rash.  Neurological: Negative for headaches.  Hematological: Does not bruise/bleed easily.  Psychiatric/Behavioral: Negative for dysphoric mood. The patient is not nervous/anxious.        Objective:   Physical Exam Well-developed female in no acute distress Nose with erythematous mucosa, mild edema, but no purulence Oropharynx clear Neck without lymphadenopathy or thyromegaly Chest with adequate air flow, no wheezing or crackles noted Cardiac exam with regular rate and rhythm Lower extremities without edema, no cyanosis Alert and oriented,  moves all 4 extremities.       Assessment & Plan:

## 2013-01-13 NOTE — Patient Instructions (Addendum)
Will check cxr today and schedule for xray of your sinuses to try and explain why you are getting recurrent episodes. Will call you once I receive the results.

## 2013-01-13 NOTE — Assessment & Plan Note (Signed)
The patient has a one-week history of significant cough with gray mucus, but based on her description, this is coming more from her upper airway than lower.  She has no wheezing on exam, and her shortness of breath is at baseline.  I am wondering if she has acute/chronic sinusitis, and whether this explains her recurrent symptoms after a short course of antibiotics.  On the other hand, this may just be related to chronic rhinitis.  For completeness, I would like to check a chest x-ray today.  Will also schedule for a limited scan of her sinuses to evaluate for acute/chronic sinus disease.

## 2013-01-13 NOTE — Addendum Note (Signed)
Addended by: Orma Flaming D on: 01/13/2013 04:11 PM   Modules accepted: Orders

## 2013-01-16 ENCOUNTER — Telehealth: Payer: Self-pay | Admitting: Pulmonary Disease

## 2013-01-16 NOTE — Telephone Encounter (Signed)
Pt informed of ct results per Dr Shelle Iron.  Will forward to MR for his recommendations when he returns.

## 2013-01-16 NOTE — Telephone Encounter (Signed)
[email or web address].Jennifer Castillo, CMA  

## 2013-01-16 NOTE — Telephone Encounter (Signed)
Pt saw KC 5.13.14 for acute visit: Patient Instructions    Will check cxr today and schedule for xray of your sinuses to try and explain why you are getting recurrent episodes.  Will call you once I receive the results.    Result Notes    Notes Recorded by Barbaraann Share, MD on 01/14/2013 at 1:42 PM Murali, I saw this pt yesterday for an acute sick visit. Please address with her. Thanks.      Dr Shelle Iron, MR is scheduled off until 5.19.14 Pt is requesting results > LMOM TCB x1  Please advise, thanks!

## 2013-01-16 NOTE — Telephone Encounter (Signed)
MR did not contact the pt or give any recs about the CT of sinues. He is out of town until 01-20-13, is there anything that needs to be done in the meantime for the pt in regards to the CT? If not I will forward message to MR to address on his return. Please advise. Carron Curie, CMA

## 2013-01-16 NOTE — Telephone Encounter (Signed)
Let her know that her sinuses do have some thickening, but there is no fluid in them.  I suspect this has nothing to do with her cough, but Dr. Marchelle Gearing may want to treat her for this to see if the cough improves.  I will leave that decision to him.

## 2013-01-16 NOTE — Telephone Encounter (Signed)
Pt returned call.  Erin Li ° °

## 2013-01-16 NOTE — Telephone Encounter (Signed)
I saw the results, forwarded the results to Dr. Marchelle Gearing, and talked personally with him about the patient.  He was supposed to call her.

## 2013-01-19 NOTE — Telephone Encounter (Signed)
cxxr - clear 01/13/13  Ct sinus - mild chronic sinusitis  Only. Recommend nasal steroid  take generic fluticasone inhaler 2 squirts each nostril daily in morning  And 2.7% % hypertonic nasal saline spray made by a company called [employer] but available at [employer] over-the-counter. sHe is to take this 2 squirts in each nostril at night  Followup as before as per office visit January 2014 but if she wants to come sooner that is fine   Dr. Kalman Shan, M.D., [facility].C.P Pulmonary and Critical Care Medicine Staff Physician [facility] Pager: [phone], If no answer or between  15:00h - 7:00h: call [phone]  01/19/2013 10:29 PM

## 2013-01-20 DIAGNOSIS — M949 Disorder of cartilage, unspecified: Secondary | ICD-10-CM | POA: Diagnosis not present

## 2013-01-20 DIAGNOSIS — E538 Deficiency of other specified B group vitamins: Secondary | ICD-10-CM | POA: Diagnosis not present

## 2013-01-20 DIAGNOSIS — M899 Disorder of bone, unspecified: Secondary | ICD-10-CM | POA: Diagnosis not present

## 2013-01-20 DIAGNOSIS — I10 Essential (primary) hypertension: Secondary | ICD-10-CM | POA: Diagnosis not present

## 2013-01-20 DIAGNOSIS — D7589 Other specified diseases of blood and blood-forming organs: Secondary | ICD-10-CM | POA: Diagnosis not present

## 2013-01-20 NOTE — Telephone Encounter (Signed)
LMTCbx1. Akoni Parton, CMA   

## 2013-01-21 NOTE — Telephone Encounter (Signed)
LMOMTCB x 1 

## 2013-01-22 MED ORDER — FLUTICASONE PROPIONATE 50 MCG/ACT NA SUSP: NASAL | Status: DC

## 2013-01-22 NOTE — Telephone Encounter (Signed)
Called, spoke with pt.  Informed her of below results and recs per MR.   She verbalized understanding of both. She is aware fluticasone rx sent to [facility] and to call pharamcies, especially [facility], to see if they have the saline nasal spray as rec below per MR. Pt to call back if she cannot find a pharm that has this particular saline nasal spray. She is aware to f/u as directed per Jan OV with MR and to call back or come in for OV prior to this if needed or is symptoms do not improve or worsen. She verbalized understanding of instructions, is in agreement with the plan, and voiced no further questions or concerns at this time.

## 2013-01-23 DIAGNOSIS — J329 Chronic sinusitis, unspecified: Secondary | ICD-10-CM | POA: Diagnosis not present

## 2013-01-23 DIAGNOSIS — R7309 Other abnormal glucose: Secondary | ICD-10-CM | POA: Diagnosis not present

## 2013-01-23 DIAGNOSIS — J309 Allergic rhinitis, unspecified: Secondary | ICD-10-CM | POA: Diagnosis not present

## 2013-04-17 ENCOUNTER — Ambulatory Visit (INDEPENDENT_AMBULATORY_CARE_PROVIDER_SITE_OTHER)
Admission: RE | Admit: 2013-04-17 | Discharge: 2013-04-17 | Disposition: A | Discharge status: Home / Self Care | Payer: Medicare Other | Source: Ambulatory Visit | Attending: Internal Medicine | Admitting: Internal Medicine

## 2013-04-17 DIAGNOSIS — Z87891 Personal history of nicotine dependence: Secondary | ICD-10-CM

## 2013-04-17 DIAGNOSIS — J449 Chronic obstructive pulmonary disease, unspecified: Secondary | ICD-10-CM | POA: Diagnosis not present

## 2013-04-17 DIAGNOSIS — R911 Solitary pulmonary nodule: Secondary | ICD-10-CM

## 2013-04-17 DIAGNOSIS — J984 Other disorders of lung: Secondary | ICD-10-CM | POA: Diagnosis not present

## 2013-04-23 ENCOUNTER — Telehealth: Payer: Self-pay | Admitting: Internal Medicine

## 2013-04-23 NOTE — Telephone Encounter (Signed)
Notes Recorded by Kalman Shan, MD on 04/20/2013 at 1:06 PM Let her know that CT chest 04/17/13 shows no change in nodule compared to Jan 2014 at [facility] and likely no change comapredd to July 2013 at [facility]. Please order next chest wo contrast July 2015.  sHe should come for interim fu for copd next few months at her conveneince  Thanks --  I spoke with patient about results and she verbalized understanding and had no questions. She stated she will call back for appt. Nothing further needed

## 2013-05-20 DIAGNOSIS — Z23 Encounter for immunization: Secondary | ICD-10-CM | POA: Diagnosis not present

## 2013-07-10 ENCOUNTER — Other Ambulatory Visit: Payer: Self-pay

## 2013-07-28 ENCOUNTER — Telehealth: Payer: Self-pay | Admitting: Pulmonary Disease

## 2013-07-28 NOTE — Telephone Encounter (Signed)
My Chart Message copied below:  Appointment Request From: Erin Li  With Provider: Barbaraann Share, MD [facility]]  Preferred Date Range: From 08/05/2013 To 08/19/2013  Preferred Times: Monday Morning, Tuesday Morning, Thursday Morning  Reason: To address the following health maintenance concerns. Tetanus/Tdap Zostavax  Comments:

## 2013-07-29 NOTE — Telephone Encounter (Signed)
lmomtcb x1 for pt 

## 2013-07-30 NOTE — Telephone Encounter (Signed)
LMTCBx2. Jennifer Castillo, CMA  

## 2013-08-01 NOTE — Telephone Encounter (Signed)
LMTCBx3. Aune Adami, CMA  

## 2013-08-04 NOTE — Telephone Encounter (Signed)
Per protocol I will close message and await call back. Carron Curie, CMA

## 2013-12-25 ENCOUNTER — Telehealth: Payer: Self-pay | Admitting: Internal Medicine

## 2013-12-25 MED ORDER — LEVOFLOXACIN 500 MG PO TABS: 500.0000 mg | ORAL_TABLET | Freq: Every day | ORAL | Status: DC

## 2013-12-25 MED ORDER — PREDNISONE 10 MG PO TABS
ORAL_TABLET | ORAL | Status: DC
Start: 2013-12-25 — End: 2014-04-30

## 2013-12-25 NOTE — Telephone Encounter (Signed)
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 Brand Males, M.D., [facility].C.P Pulmonary and Critical Care Medicine Staff Physician [place] [facility] Critical [facility] Pager: [phone], If no answer or between  15:00h - 7:00h: call [phone]  12/25/2013 9:36 AM

## 2013-12-25 NOTE — Telephone Encounter (Signed)
Called and spoke with pts husband and he is aware of MR recs.  He is aware that meds have been sent to the pharmacy.  He stated that he will have the pt call back in a couple of days when she is feeling better to schedule the PFT and appt with MR.

## 2013-12-25 NOTE — Telephone Encounter (Signed)
Called and spoke with pt and she has been sick x 3 days.  Cough that she is getting up some sputum but it is not clear, temp of 100 and up, body aches, sore throat.  Pt does not want to come in to be seen today.  Pt is requesting that something be called to the pharmacy for her.  Pt also is requesting a cough med.  MR please advise. Thanks  No Known Allergies   Current Outpatient Prescriptions on File Prior to Visit  Medication Sig Dispense Refill  . albuterol (PROVENTIL HFA;VENTOLIN HFA) 108 (90 BASE) MCG/ACT inhaler Inhale 2 puffs into the lungs every 6 (six) hours as needed.      . Ascorbic Acid (VITAMIN C) 1000 MG tablet Take 1,000 mg by mouth daily.      [name] b complex vitamins tablet Take 1 tablet by mouth daily.      . calcium-vitamin D 250-100 MG-UNIT per tablet Take 1 tablet by mouth 2 (two) times daily.      [name] diltiazem (CARDIZEM) 60 MG tablet Take 60 mg by mouth 3 (three) times daily.      . fish oil-omega-3 fatty acids 1000 MG capsule Take 2 g by mouth daily.      . fluticasone (FLONASE) 50 MCG/ACT nasal spray 2 squirts each nostril daily in morning  16 g  2  . glucosamine-chondroitin 500-400 MG tablet Take 1 tablet by mouth every morning.      [name] HYDROcodone-homatropine (HYCODAN) 5-1.5 MG/5ML syrup Take 5 mLs by mouth every 6 (six) hours as needed for cough.  120 mL  0  . levofloxacin (LEVAQUIN) 500 MG tablet Take 1 tablet (500 mg total) by mouth daily.  6 tablet  0  . metoprolol succinate (TOPROL-XL) 50 MG 24 hr tablet Take 50 mg by mouth daily. Take with or immediately following a meal.      . selenium 50 MCG TABS Take 50 mcg by mouth daily.      [name] senna (SENOKOT) 8.6 MG tablet Take 1 tablet by mouth daily.      [name] tiotropium (SPIRIVA) 18 MCG inhalation capsule Place 1 capsule (18 mcg total) into inhaler and inhale daily.  90 capsule  4  . vitamin B-12 (CYANOCOBALAMIN) 1000 MCG tablet Take 1,000 mcg by mouth daily.      . vitamin E 200 UNIT capsule Take 200 Units by mouth daily.        No current facility-administered medications on file prior to visit.

## 2014-04-30 ENCOUNTER — Ambulatory Visit (INDEPENDENT_AMBULATORY_CARE_PROVIDER_SITE_OTHER): Payer: Medicare Other | Admitting: Internal Medicine

## 2014-04-30 ENCOUNTER — Encounter: Payer: Self-pay | Admitting: Internal Medicine

## 2014-04-30 VITALS — BP 128/78 | HR 69 | Ht 66.0 in | Wt 145.0 lb

## 2014-04-30 DIAGNOSIS — J441 Chronic obstructive pulmonary disease with (acute) exacerbation: Secondary | ICD-10-CM | POA: Diagnosis not present

## 2014-04-30 DIAGNOSIS — R911 Solitary pulmonary nodule: Secondary | ICD-10-CM | POA: Diagnosis not present

## 2014-04-30 MED ORDER — TIOTROPIUM BROMIDE MONOHYDRATE 18 MCG IN CAPS: 18.0000 ug | ORAL_CAPSULE | Freq: Every day | RESPIRATORY_TRACT | Status: DC

## 2014-04-30 MED ORDER — PREDNISONE 10 MG PO TABS: ORAL_TABLET | ORAL | Status: DC

## 2014-04-30 NOTE — Progress Notes (Signed)
Subjective:    Patient ID: Erin Li, female    DOB: 11/03/1940, 73 y.o.   MRN: [id number]  HPI   #  From [place] but have retired and settle in [place]  #hx of smoking:  STarted smoking at age 32-14. Quit smoking 8 months ago. Inspired to quit following son's  (aged 13) lung transplant in 2011 at [facility] for pulmonary fibrosis following exposure to glsss particles at [employer] Artery calciffication  -   Atherosclerotic calcifications in the coronary arteries including the left main and LAD. Aortic valvular calcification - July 2013 [facility] CT  #Lung nodule   - INDETERMINATE  -  CHEST CTA 03/20/12 at [facility]: from care everywhere but image N/A   . Lungs: 7 mm nodule in the superior aspect of the right lower lobe (series 3, image 47). Additional 5 mm nodule in the right lower lobe (series 3, image 75). 5 mm pleural-based nodule in the peripheral right lower lobe (series 3, image 80). Linear atelectasis in the lingula. . Pleura: Small left pneumothorax. Mild apical predominant centrilobular and paraseptal emphysema.  -  CT scan January 2014 shows stable pulmonary nodules since July 2013 at [place] blood test FEb 2014:l ung cancer protein express lung dated 09/27/2012 suggest that this nodule is most likely benign and not a lung cancer. The confidence level is 84%.  - CT chest 04/17/13 shows no change in nodule compared to [facility] 2014 [facility] and likely no change comapredd to July 2013 at [facility].  #COPD  - Spirometry   fev1 0.93L/39%, Ratio 50 - Gold stage 3 COPD. she is only on albuterol as needed. She is reluctant to take inhaled corticosteroids.. STarted on spiriva late 2013     OV 04/30/2014 Chief Complaint  Patient presents with  . Follow-up    Pt last seen on 09/2012 by MR. Pt here to review CT that was done in 04/2014. Pt states her breathing worsens with the hot temp. Pt c/o prod cough with clear mucous. Pt denies CP/tightness.    Followup follow the  above issues. Not seen her since January 2014 which is over 18 months ago  Smoking :  reports that she quit smoking about 2 years ago. She does not have any smokeless tobacco history on file.  Lung nodule: Chest 7 mm right lower lobe superior aspect lung nodule that is stable to be July 2013 and August 2014 in one year. She was supposed to have followup CT scan of the chest earlier this year but so far has not. We will reorder it   COPD: Gold stage III COPD. At last visit 18 months ago due to her reluctance to take inhaled steroids. I kept on maintenance Spirivat she  she takes it only as needed. She and her husband was out in the gym and walk a total of couple of miles a day. Now th husband does notice increased dyspnea which the patient denies but both agree that she has increased cough, chest tightness and increase in sputum volume but no change in sputum color. She attributes this to the heat and humidity. No sick contacts. She continues to take Spiriva only as needed.    Past medical history: Past, Family, Social reviewed: no change since last visit   Review of Systems  Constitutional: Negative for fever and unexpected weight change.  HENT: Positive for congestion, postnasal drip and sinus pressure. Negative for dental problem, ear pain, nosebleeds,  rhinorrhea, sneezing, sore throat and trouble swallowing.   Eyes: Negative for redness and itching.  Respiratory: Positive for cough and shortness of breath. Negative for chest tightness and wheezing.   Cardiovascular: Negative for palpitations and leg swelling.  Gastrointestinal: Negative for nausea and vomiting.  Genitourinary: Negative for dysuria.  Musculoskeletal: Negative for joint swelling.  Skin: Negative for rash.  Neurological: Negative for headaches.  Hematological: Does not bruise/bleed easily.  Psychiatric/Behavioral: Negative for dysphoric mood. The patient is not nervous/anxious.        Objective:   Physical Exam  Filed  Vitals:   04/30/14 0954  BP: 128/78  Pulse: 69  Height: 5\' 6"  (1.676 m)  Weight: 145 lb (65.772 kg)  SpO2: 91%  itals reviewed. Constitutional: She is oriented to person, place, and time. She appears well-developed and well-nourished. No distress.  HENT:  Head: Normocephalic and atraumatic.  Right Ear: External ear normal.  Left Ear: External ear normal.  Mouth/Throat: Oropharynx is clear and moist. No oropharyngeal exudate.  Eyes: Conjunctivae normal and EOM are normal. Pupils are equal, round, and reactive to light. Right eye exhibits no discharge. Left eye exhibits no discharge. No scleral icterus.  Neck: Normal range of motion. Neck supple. No JVD present. No tracheal deviation present. No thyromegaly present.  Cardiovascular: Normal rate, regular rhythm, normal heart sounds and intact distal pulses.  Exam reveals no gallop and no friction rub.   No murmur heard. Pulmonary/Chest: Effort normal and breath sounds normal. No respiratory distress. She has no wheezes. She has no rales. She exhibits no tenderness.  NEW ONSET CRACKLES + Abdominal: Soft. Bowel sounds are normal. She exhibits no distension and no mass. There is no tenderness. There is no rebound and no guarding.  Musculoskeletal: Normal range of motion. She exhibits no edema and no tenderness.  Lymphadenopathy:    She has no cervical adenopathy.  Neurological: She is alert and oriented to person, place, and time. She has normal reflexes. No cranial nerve deficit. She exhibits normal muscle tone. Coordination normal.  Skin: Skin is warm and dry. No rash noted. She is not diaphoretic. No erythema. No pallor.  Psychiatric: She has a normal mood and affect. Her behavior is normal. Judgment and thought content normal.         Assessment & Plan:   #COPD   - I thnk in flare up due to heat and lack of daily spiriva  - Please REstart spiriva 1 puff daily - take  script and show technique -Take prednisone 40 mg daily x 2 days,  then 20mg  daily x 2 days, then 10mg  daily x 2 days, then 5mg  daily x 2 days and stop - have flu shot when available in cmmunity - consider prevnar and alpha 1 check at followup if not done (last pneumovax was Sep 24, 2012)   #Lung nodule  - High Resolution CT chest without contrast on ILD protocol for lung nodule and crackles on exam and due to family hx of pulmonary fibrosis  #Followup  < 3 weeks after CT chest with me or my NP Tammy

## 2014-04-30 NOTE — Patient Instructions (Addendum)
#  COPD   - I thnk in flare up due to heat and lack of daily spiriva  - Please REstart spiriva 1 puff daily - take  script and show technique -Take prednisone 40 mg daily x 2 days, then 20mg  daily x 2 days, then 10mg  daily x 2 days, then 5mg  daily x 2 days and stop - have flu shot when available in cmmunity - consider prevnar and alpha 1 check at followup if not done (last pneumovax was Sep 24, 2012)   #Lung nodule  - High Resolution CT chest without contrast on ILD protocol for lung nodule and crackles on exam  #Followup  < 3 weeks after CT chest with me or my NP Tammy

## 2014-05-02 NOTE — Assessment & Plan Note (Signed)
#  Lung nodule  - High Resolution CT chest without contrast on ILD protocol for lung nodule and crackles on exam

## 2014-05-02 NOTE — Assessment & Plan Note (Signed)
#  COPD   - I thnk in flare up due to heat and lack of daily spiriva  - Please REstart spiriva 1 puff daily - take  script and show technique -Take prednisone 40 mg daily x 2 days, then 20mg  daily x 2 days, then 10mg  daily x 2 days, then 5mg  daily x 2 days and stop - have flu shot when available in cmmunity - consider prevnar and alpha 1 check at followup if not done (last pneumovax was Sep 24, 2012)    #Followup  < 3 weeks after CT chest with me or my NP Tammy

## 2014-05-05 ENCOUNTER — Ambulatory Visit (INDEPENDENT_AMBULATORY_CARE_PROVIDER_SITE_OTHER)
Admission: RE | Admit: 2014-05-05 | Discharge: 2014-05-05 | Disposition: A | Discharge status: Home / Self Care | Payer: Medicare Other | Source: Ambulatory Visit | Attending: Internal Medicine | Admitting: Internal Medicine

## 2014-05-05 DIAGNOSIS — R911 Solitary pulmonary nodule: Secondary | ICD-10-CM

## 2014-05-05 DIAGNOSIS — J441 Chronic obstructive pulmonary disease with (acute) exacerbation: Secondary | ICD-10-CM | POA: Diagnosis not present

## 2014-05-05 DIAGNOSIS — J984 Other disorders of lung: Secondary | ICD-10-CM | POA: Diagnosis not present

## 2014-05-06 ENCOUNTER — Telehealth: Payer: Self-pay | Admitting: Internal Medicine

## 2014-05-06 DIAGNOSIS — R911 Solitary pulmonary nodule: Secondary | ICD-10-CM

## 2014-05-06 NOTE — Telephone Encounter (Signed)
Let her know regarduing CT chest results from 05/05/14  1. No pulmonary fibrosis  2. Lung nodule is minimally increased - so do Ct chest wo cotnrast in  62months   3. Cancel fu with tP in 3 weeks instead give her fu with me in 3 months with full PFT and CT chest wo contrast   Thanks  Dr. Brand Males, M.D., [facility].C.P Pulmonary and Critical Care Medicine Staff Physician [place] [facility] Pager: [phone], If no answer or between  15:00h - 7:00h: call [phone]  05/06/2014 8:41 AM    Ct Chest High Resolution  05/06/2014   CLINICAL DATA:  Productive cough with clear sputum.  EXAM: CT CHEST WITHOUT CONTRAST  TECHNIQUE: Multidetector CT imaging of the chest was performed following the standard protocol without intravenous contrast. High resolution imaging of the lungs, as well as inspiratory and expiratory imaging, was performed.  COMPARISON:  04/17/2013 and 09/13/2012.  FINDINGS: No pathologically enlarged mediastinal or axillary lymph nodes. Hilar regions are difficult to definitively evaluate without IV contrast but appear grossly unremarkable. Coronary artery calcification. Heart size normal. No pericardial effusion.  Centrilobular emphysema with minimal paraseptal component at the apices. No subpleural reticulation, traction bronchiectasis/ bronchiolectasis, ground-glass, architectural distortion or honeycombing. Favor subpleural scarring in the lateral left lower lobe over asymmetric subpleural reticulation. Scattered pulmonary parenchymal scarring. Subpleural left lower lobe nodule measures 10 mm (7 x 12 mm, series 5, image 49). Previously, lesion measured on 04/17/2013 and is possibly more organized than on 09/13/2012 (series 3, image 50). Additional pulmonary nodules measure up to 7 mm in the superior segment right lower lobe, unchanged from baseline examination of 09/13/2012. No air trapping. No pleural fluid. Airway is unremarkable.  Incidental  imaging of the upper abdomen shows the visualized portions of the liver, adrenal glands, kidneys, spleen, pancreas, stomach and bowel to be grossly unremarkable. No upper abdominal adenopathy. No worrisome lytic or sclerotic lesions. Degenerative changes are seen in the spine. Old bilateral rib fractures.  IMPRESSION: 1. No definitive evidence of interstitial lung disease. Subpleural lines in the lateral left lower lobe are felt to be due to scarring. 2. Subpleural left lower lobe nodule appears minimally increased in size from 04/17/2013 and possibly more organized than on 09/13/2012. Consider short-term follow-up in 3 months in further evaluation, as clinically indicated. 3. Additional pulmonary nodules are stable. Typically, 2 years of documented stability are recommended. This recommendation follows the consensus statement: Guidelines for Management of Small Pulmonary Nodules Detected on CT Scans: A Statement from the Fleischner Society as published in Radiology 2005; 237:395-400. 4. Coronary artery calcification.   Electronically Signed   By: Lorin Picket M.D.   On: 05/06/2014 08:11

## 2014-05-08 ENCOUNTER — Encounter: Payer: Self-pay | Admitting: Internal Medicine

## 2014-05-08 NOTE — Telephone Encounter (Signed)
lmtcb

## 2014-05-08 NOTE — Telephone Encounter (Signed)
Pt returned call- [phone]

## 2014-05-08 NOTE — Telephone Encounter (Signed)
Called and spoke to pt. Informed pt of results and recs per MR. CT order placed. 3 month f/u recall placed with PFT. Pt aware 3 week f/u has been cancelled. Nothing further needed.

## 2014-05-14 ENCOUNTER — Ambulatory Visit: Payer: Medicare Other | Admitting: Internal Medicine

## 2014-05-22 DIAGNOSIS — Z23 Encounter for immunization: Secondary | ICD-10-CM | POA: Diagnosis not present

## 2014-07-02 ENCOUNTER — Encounter: Payer: Self-pay | Admitting: Internal Medicine

## 2014-07-06 ENCOUNTER — Telehealth: Payer: Self-pay | Admitting: Internal Medicine

## 2014-07-06 NOTE — Telephone Encounter (Signed)
According to 05/06/14 phone msg:  Pt needs f/u with MR in 3 months with full PFT and CT chest wo contrast.    Pt has a pending OV on 08/07/14 with PFTs at 1 pm followed by OV with MR at 2 pm.    PCCs, CT Chest order is in epic.  Please assist with scheduling this.  Thank you.

## 2014-07-07 NOTE — Telephone Encounter (Signed)
Chest ct@11 :30am on 08/07/14 lmtcb Joellen Jersey

## 2014-07-07 NOTE — Telephone Encounter (Signed)
Spoke to pt she is aware of this appt Joellen Jersey

## 2014-08-06 ENCOUNTER — Other Ambulatory Visit: Payer: Self-pay | Admitting: Internal Medicine

## 2014-08-06 DIAGNOSIS — R0600 Dyspnea, unspecified: Secondary | ICD-10-CM

## 2014-08-07 ENCOUNTER — Ambulatory Visit (INDEPENDENT_AMBULATORY_CARE_PROVIDER_SITE_OTHER): Payer: Medicare Other | Admitting: Internal Medicine

## 2014-08-07 ENCOUNTER — Ambulatory Visit (INDEPENDENT_AMBULATORY_CARE_PROVIDER_SITE_OTHER)
Admission: RE | Admit: 2014-08-07 | Discharge: 2014-08-07 | Disposition: A | Discharge status: Home / Self Care | Payer: Medicare Other | Source: Ambulatory Visit | Attending: Internal Medicine | Admitting: Internal Medicine

## 2014-08-07 ENCOUNTER — Encounter: Payer: Self-pay | Admitting: Internal Medicine

## 2014-08-07 VITALS — BP 138/86 | HR 70 | Ht 64.0 in | Wt 150.0 lb

## 2014-08-07 DIAGNOSIS — F17200 Nicotine dependence, unspecified, uncomplicated: Secondary | ICD-10-CM | POA: Diagnosis not present

## 2014-08-07 DIAGNOSIS — R918 Other nonspecific abnormal finding of lung field: Secondary | ICD-10-CM | POA: Diagnosis not present

## 2014-08-07 DIAGNOSIS — J449 Chronic obstructive pulmonary disease, unspecified: Secondary | ICD-10-CM | POA: Diagnosis not present

## 2014-08-07 DIAGNOSIS — R0600 Dyspnea, unspecified: Secondary | ICD-10-CM | POA: Diagnosis not present

## 2014-08-07 DIAGNOSIS — R911 Solitary pulmonary nodule: Secondary | ICD-10-CM

## 2014-08-07 LAB — PULMONARY FUNCTION TEST
DL/VA % pred: 78 %
DL/VA: 3.78 ml/min/mmHg/L
DLCO UNC % PRED: 61 %
DLCO unc: 14.86 ml/min/mmHg
FEF 25-75 POST: 0.56 L/s
FEF 25-75 PRE: 0.4 L/s
FEF2575-%Change-Post: 38 %
FEF2575-%PRED-POST: 30 %
FEF2575-%Pred-Pre: 22 %
FEV1-%Change-Post: 13 %
FEV1-%Pred-Post: 53 %
FEV1-%Pred-Pre: 47 %
FEV1-PRE: 1.04 L
FEV1-Post: 1.18 L
FEV1FVC-%Change-Post: 3 %
FEV1FVC-%PRED-PRE: 69 %
FEV6-%Change-Post: 7 %
FEV6-%PRED-POST: 72 %
FEV6-%Pred-Pre: 67 %
FEV6-POST: 2.03 L
FEV6-Pre: 1.89 L
FEV6FVC-%CHANGE-POST: -2 %
FEV6FVC-%Pred-Post: 97 %
FEV6FVC-%Pred-Pre: 100 %
FVC-%CHANGE-POST: 9 %
FVC-%Pred-Post: 74 %
FVC-%Pred-Pre: 67 %
FVC-POST: 2.18 L
FVC-Pre: 1.99 L
PRE FEV1/FVC RATIO: 53 %
Post FEV1/FVC ratio: 54 %
Post FEV6/FVC ratio: 93 %
Pre FEV6/FVC Ratio: 95 %
RV % pred: 130 %
RV: 2.92 L
TLC % pred: 102 %
TLC: 5.19 L

## 2014-08-07 MED ORDER — FLUTICASONE FUROATE-VILANTEROL 100-25 MCG/INH IN AEPB: 1.0000 | INHALATION_SPRAY | Freq: Every day | RESPIRATORY_TRACT | Status: DC

## 2014-08-07 NOTE — Progress Notes (Signed)
Subjective:    Patient ID: Erin Li, female    DOB: 06/19/1941, 73 y.o.   MRN: [id number]  HPI   #  From [place] but have retired and settle in [place]  #hx of smoking:  STarted smoking at age [age]. Quit smoking 8 months ago. Inspired to quit following son's  (aged 52) lung transplant in 2011 at [facility] for pulmonary fibrosis following exposure to glsss particles at [employer] Artery calciffication  -   Atherosclerotic calcifications in the coronary arteries including the left main and LAD. Aortic valvular calcification - July 2013 [facility] CT  #Lung nodule   - INDETERMINATE  -  CHEST CTA 03/20/12 at [facility]: from care everywhere but image N/A   . Lungs: 7 mm nodule in the superior aspect of the right lower lobe (series 3, image 47). Additional 5 mm nodule in the right lower lobe (series 3, image 75). 5 mm pleural-based nodule in the peripheral right lower lobe (series 3, image 80). Linear atelectasis in the lingula. . Pleura: Small left pneumothorax. Mild apical predominant centrilobular and paraseptal emphysema.  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 The confidence level is 84%.  - CT chest 04/17/13 shows no change in nodule compared to Jan 2014 [facility] and likely no change comapredd to July 2013 at [facility].  #COPD  - Spirometry   fev1 0.93L/39%, Ratio 50 - Gold stage 3 COPD. she is only on albuterol as needed. She is reluctant to take inhaled corticosteroids.. STarted on spiriva late 2013     OV 04/30/2014 Chief Complaint  Patient presents with  . Follow-up    Pt last seen on 09/2012 by MR. Pt here to review CT that was done in 04/2014. Pt states her breathing worsens with the hot temp. Pt c/o prod cough with clear mucous. Pt denies CP/tightness.    Followup follow the  above issues. Not seen her since January 2014 which is over 18 months ago  Smoking :  reports that she quit smoking about 2 years ago. She does not have any smokeless tobacco history on file.  Lung nodule: Chest 7 mm right lower lobe superior aspect lung nodule that is stable to be July 2013 and August 2014 in one year. She was supposed to have followup CT scan of the chest earlier this year but so far has not. We will reorder it   COPD: Gold stage III COPD. At last visit 18 months ago due to her reluctance to take inhaled steroids. I kept on maintenance Spirivat she  she takes it only as needed. She and her husband was out in the gym and walk a total of couple of miles a day. Now th husband does notice increased dyspnea which the patient denies but both agree that she has increased cough, chest tightness and increase in sputum volume but no change in sputum color. She attributes this to the heat and humidity. No sick contacts. She continues to take Spiriva only as needed.     #COPD   - I thnk in flare up due to heat and lack of daily spiriva  - Please REstart spiriva 1 puff daily - take  script and show technique -Take prednisone 40 mg daily x 2 days, then 20mg   daily x 2 days, then 10mg  daily x 2 days, then 5mg  daily x 2 days and stop - have flu shot when available in cmmunity - consider prevnar and alpha 1 check at followup if not done (last pneumovax was Sep 24, 2012)   #Lung nodule  - High Resolution CT chest without contrast on ILD protocol for lung nodule and crackles on exam  #Followup  < 3 weeks after CT chest with me or my NP Tamm  OV 08/07/2014  Chief Complaint  Patient presents with  . Follow-up    Pt here after CT and PFT. Pt states she felt better after taking pred last visit. Pt c/o DOE, mild dry cough. Pt denies CP/tightness.      Smoking :   reports that she quit smoking about 2 years ago. She does not have any smokeless tobacco history on file.   Lung nodule: Chest  7 mm right lower lobe superior aspect lung nodule that is stable to be July 2013 and August 2014 in one year.  Had fu scan Aug 2015 and this suggested some prominence to this nodule. So she had scan 08/07/2014 - shows nodule is unchanged. At 50mm. However, there is a NEW LUL 72mm nodule 08/07/2014    COPD: Gold stage III COPD previous diagnosis but that was on spiriva prn. Now she takes it 4 times per week. PFT shows gold stage 2 copd - post BD - fev1 1.18L/53%, (13% BD response), Ratio 54, DLCPO 14.8/61%. She feels well. In sumer she went kayaking Denies any aecopd since last visit but on exam today still has som scattered wheeze  Ct Chest Wo Contrast  08/07/2014   CLINICAL DATA:  Followup indeterminate pulmonary nodules.  Smoker.  EXAM: CT CHEST WITHOUT CONTRAST  TECHNIQUE: Multidetector CT imaging of the chest was performed following the standard protocol without IV contrast.  COMPARISON:  05/05/2014, 04/17/2013 and 09/13/2012  FINDINGS: Mediastinum/Hilar Regions: No masses or pathologically enlarged lymph nodes identified.  Other Thoracic Lymphadenopathy:  None.  Lungs: Mild emphysema is demonstrated. Multiple scattered subpleural pulmonary nodules are seen throughout both lungs which are stable compared to previous studies. This includes a 8 mm pulmonary nodule in the superior segment of the right lower lobe on image 25 .  A single new ill-defined nodular density is seen in the anterior left upper lobe measuring 10 mm on image 32. Given its rapid evolution since most recent exam on 05/05/2014, this favors an inflammatory or infectious etiology rather than neoplastic.  Pleura:  No evidence of effusion or mass.  Vascular/Cardiac:  No acute findings identified.  Other:  None.  Musculoskeletal:  No suspicious bone lesions identified.  IMPRESSION: Multiple scattered bilateral pulmonary nodules remain stable, except for a new ill-defined 10 mm nodular opacity in the anterior left upper lobe. Its rapid appearance  since most recent exam on 05/05/2014 strongly favors inflammatory or infectious etiology over neoplasm. Continued followup by chest CT recommended in 3 months.   Electronically Signed   By: Earle Gell M.D.   On: 08/07/2014 13:27    Immunization History  Administered Date(s) Administered  . Influenza Split 06/04/2013, 05/05/2014  . Influenza Whole 05/28/2012  . Pneumococcal Polysaccharide-23 09/04/2005, 09/24/2012    Review of Systems  Constitutional: Negative for fever and unexpected weight change.  HENT: Negative for congestion, dental problem, ear pain, nosebleeds, postnasal drip, rhinorrhea, sinus pressure, sneezing, sore throat and trouble swallowing.   Eyes: Negative for redness and itching.  Respiratory: Positive for cough and  shortness of breath. Negative for chest tightness and wheezing.   Cardiovascular: Negative for palpitations and leg swelling.  Gastrointestinal: Negative for nausea and vomiting.  Genitourinary: Negative for dysuria.  Musculoskeletal: Negative for joint swelling.  Skin: Negative for rash.  Neurological: Negative for headaches.  Hematological: Does not bruise/bleed easily.  Psychiatric/Behavioral: Negative for dysphoric mood. The patient is not nervous/anxious.    Current outpatient prescriptions: Ascorbic Acid (VITAMIN C) 1000 MG tablet, Take 1,000 mg by mouth daily., Disp: , Rfl: ;  b complex vitamins tablet, Take 1 tablet by mouth daily., Disp: , Rfl: ;  calcium-vitamin D 250-100 MG-UNIT per tablet, Take 1 tablet by mouth 2 (two) times daily., Disp: , Rfl: ;  diltiazem (CARDIZEM) 60 MG tablet, Take 60 mg by mouth 3 (three) times daily., Disp: , Rfl:  fish oil-omega-3 fatty acids 1000 MG capsule, Take 2 g by mouth daily., Disp: , Rfl: ;  glucosamine-chondroitin 500-400 MG tablet, Take 1 tablet by mouth every morning., Disp: , Rfl: ;  metoprolol succinate (TOPROL-XL) 50 MG 24 hr tablet, Take 50 mg by mouth daily. Take with or immediately following a meal., Disp:  , Rfl: ;  selenium 50 MCG TABS, Take 50 mcg by mouth daily., Disp: , Rfl:  senna (SENOKOT) 8.6 MG tablet, Take 1 tablet by mouth daily., Disp: , Rfl: ;  tiotropium (SPIRIVA) 18 MCG inhalation capsule, Place 1 capsule (18 mcg total) into inhaler and inhale daily., Disp: 30 capsule, Rfl: 5;  vitamin B-12 (CYANOCOBALAMIN) 1000 MCG tablet, Take 1,000 mcg by mouth daily., Disp: , Rfl: ;  vitamin E 200 UNIT capsule, Take 200 Units by mouth daily., Disp: , Rfl:  albuterol (PROVENTIL HFA;VENTOLIN HFA) 108 (90 BASE) MCG/ACT inhaler, Inhale 2 puffs into the lungs every 6 (six) hours as needed., Disp: , Rfl: ;  fluticasone (FLONASE) 50 MCG/ACT nasal spray, 2 squirts each nostril daily in morning (Patient not taking: Reported on 08/07/2014), Disp: 16 g, Rfl: 2 HYDROcodone-homatropine (HYCODAN) 5-1.5 MG/5ML syrup, Take 5 mLs by mouth every 6 (six) hours as needed for cough. (Patient not taking: Reported on 08/07/2014), Disp: 120 mL, Rfl: 0;  predniSONE (DELTASONE) 10 MG tablet, 40 mg daily x 2 days, then 20mg  daily x 2 days, then 10mg  daily x 2 days, then 5mg  daily x 2 days and stop (Patient not taking: Reported on 08/07/2014), Disp: 15 tablet, Rfl: 0      Objective:   Physical Exam  Constitutional: She is oriented to person, place, and time. She appears well-developed and well-nourished. No distress.  Body mass index is 25.73 kg/(m^2).   HENT:  Head: Normocephalic and atraumatic.  Right Ear: External ear normal.  Left Ear: External ear normal.  Mouth/Throat: Oropharynx is clear and moist. No oropharyngeal exudate.  Eyes: Conjunctivae and EOM are normal. Pupils are equal, round, and reactive to light. Right eye exhibits no discharge. Left eye exhibits no discharge. No scleral icterus.  Neck: Normal range of motion. Neck supple. No JVD present. No tracheal deviation present. No thyromegaly present.  Cardiovascular: Normal rate, regular rhythm, normal heart sounds and intact distal pulses.  Exam reveals no gallop  and no friction rub.   No murmur heard. Pulmonary/Chest: Effort normal. No respiratory distress. She has wheezes. She has no rales. She exhibits no tenderness.  Scattered wheeze in base +  Abdominal: Soft. Bowel sounds are normal. She exhibits no distension and no mass. There is no tenderness. There is no rebound and no guarding.  Musculoskeletal: Normal range of motion.  She exhibits no edema or tenderness.  Lymphadenopathy:    She has no cervical adenopathy.  Neurological: She is alert and oriented to person, place, and time. She has normal reflexes. No cranial nerve deficit. She exhibits normal muscle tone. Coordination normal.  Skin: Skin is warm and dry. No rash noted. She is not diaphoretic. No erythema. No pallor.  Psychiatric: She has a normal mood and affect. Her behavior is normal. Judgment and thought content normal.  Vitals reviewed.   Filed Vitals:   08/07/14 1403  BP: 138/86  Pulse: 70  Height: 5\' 4"  (1.626 m)  Weight: 150 lb (68.04 kg)  SpO2: 97%         Assessment & Plan:     ICD-9-CM ICD-10-CM   1. Chronic obstructive pulmonary disease, unspecified COPD, unspecified chronic bronchitis type 496 J44.9   2. Lung nodule 793.11 R91.1   3. COPD, moderate 496 J44.9      #COPD  - clinically stable but you are wheezing and breathing test suggest more room for improvement  - have PREVNAR vaccine 08/07/2014 - change spiriva to BREO 1 puff daily (low dose) due to wheezing and bronchodilator reactivity on PFT - use albuterol as needed  - will refer you to [place] COPD trial called IMPACT   #Lung nodule  - new lung nodule dec 2015 in left upper lobe - dec 2015 - 56mm,   - 48mm RLL superior segment lung nodule stable July 2013 through Dec 2015 - both lung nodules likely benign  - repeat ct chest wo contrast low dose protocol in 4 months   #FOllowup 2 weeks - call us with response to breo  4 months or sooner if needed

## 2014-08-07 NOTE — Progress Notes (Signed)
PFT done today. 

## 2014-08-07 NOTE — Patient Instructions (Addendum)
#  COPD  - clinically stable but you are wheezing and breathing test suggest more room for improvement  - have PREVNAR vaccine 08/07/2014 - change spiriva to BREO 1 puff daily (low dose) due to wheezing and bronchodilator reactivity on PFT - use albuterol as needed  - will refer you to [place] COPD trial called IMPACT   #Lung nodule  - new lung nodule dec 2015 in left upper lobe - dec 2015 - 51mm,   - 60mm RLL superior segment lung nodule stable July 2013 through Dec 2015 - both lung nodules likely benign  - repeat ct chest wo contrast low dose protocol in 4 months   #FOllowup 2 weeks - call us with response to breo  4 months or sooner if needed

## 2014-11-19 ENCOUNTER — Other Ambulatory Visit: Payer: Self-pay | Admitting: Internal Medicine

## 2014-11-19 DIAGNOSIS — R0600 Dyspnea, unspecified: Secondary | ICD-10-CM

## 2014-11-20 ENCOUNTER — Encounter: Payer: Self-pay | Admitting: Adult Health

## 2014-11-20 ENCOUNTER — Ambulatory Visit (INDEPENDENT_AMBULATORY_CARE_PROVIDER_SITE_OTHER): Payer: Medicare Other | Admitting: Adult Health

## 2014-11-20 ENCOUNTER — Ambulatory Visit (INDEPENDENT_AMBULATORY_CARE_PROVIDER_SITE_OTHER): Payer: Medicare Other | Admitting: Internal Medicine

## 2014-11-20 VITALS — BP 130/70 | HR 66 | Temp 97.7°F | Ht 65.0 in | Wt 148.0 lb

## 2014-11-20 DIAGNOSIS — R0600 Dyspnea, unspecified: Secondary | ICD-10-CM | POA: Diagnosis not present

## 2014-11-20 DIAGNOSIS — Z006 Encounter for examination for normal comparison and control in clinical research program: Secondary | ICD-10-CM | POA: Insufficient documentation

## 2014-11-20 LAB — PULMONARY FUNCTION TEST
FEF 25-75 PRE: 0.45 L/s
FEF 25-75 Post: 0.6 L/sec
FEF2575-%CHANGE-POST: 33 %
FEF2575-%Pred-Post: 33 %
FEF2575-%Pred-Pre: 24 %
FEV1-%CHANGE-POST: 7 %
FEV1-%Pred-Post: 58 %
FEV1-%Pred-Pre: 54 %
FEV1-POST: 1.29 L
FEV1-Pre: 1.2 L
FEV1FVC-%Change-Post: 6 %
FEV1FVC-%Pred-Pre: 72 %
FEV6-%Change-Post: 4 %
FEV6-%Pred-Post: 77 %
FEV6-%Pred-Pre: 74 %
FEV6-POST: 2.16 L
FEV6-PRE: 2.06 L
FEV6FVC-%CHANGE-POST: 3 %
FEV6FVC-%Pred-Post: 101 %
FEV6FVC-%Pred-Pre: 97 %
FVC-%CHANGE-POST: 0 %
FVC-%PRED-POST: 76 %
FVC-%Pred-Pre: 75 %
FVC-PRE: 2.22 L
FVC-Post: 2.24 L
POST FEV1/FVC RATIO: 58 %
POST FEV6/FVC RATIO: 97 %
Pre FEV1/FVC ratio: 54 %
Pre FEV6/FVC Ratio: 93 %

## 2014-11-20 NOTE — Progress Notes (Signed)
GSK IMPACT research study: blinded >/= 52 week randomized trial of ANORO equivalent v BREO equivalent v GSK triple MDI (ICS + LABA + LAMA). Sponsored by [place] protocol number: [id number]   This visit 11/20/2014 is a research Visit and is for purpose of screening and is number 1 on PROTOCOL

## 2014-11-20 NOTE — Progress Notes (Signed)
   Subjective:    Patient ID: Erin Li, female    DOB: Dec 13, 1940, 74 y.o.   MRN: [id number]  HPI  GSK IMPACT research study: blinded >/= 52 week randomized trial of ANORO equivalent v BREO equivalent v GSK triple MDI (ICS + LABA + LAMA). Sponsored by [place] protocol number: [id number]  This visit 11/20/2014 is a research Visit and is for purpose of screening and is number 1 on PROTOCOL  Pt has COPD: Gold stage III COPD.   Previously on Spriva -only took As needed   Started on BREO 08/2014 Says her breathing is stable without flare of cough or wheezing  Had 2 flares last year.  No chest pain, orthopnea, hemoptysis or edema.     Review of Systems See hpi     Objective:   Physical Exam GEN: A/Ox3; pleasant , NAD, well nourished   HEENT:  [place]/AT,  EACs-clear, TMs-wnl, NOSE-clear, THROAT-clear, no lesions, no postnasal drip or exudate noted. , no thrush noted.   NECK:  Supple w/ fair ROM; no JVD; normal carotid impulses w/o bruits; no thyromegaly or nodules palpated; no lymphadenopathy.  RESP  Clear  P & A; w/o, wheezes/ rales/ or rhonchi.no accessory muscle use, no dullness to percussion  CARD:  RRR, no m/r/g  , no peripheral edema, pulses intact, no cyanosis or clubbing.  GI:   Soft & nt; nml bowel sounds; no organomegaly or masses detected.  Musco: Warm bil, no deformities or joint swelling noted.   Neuro: alert, no focal deficits noted.    Skin: Warm, no lesions or rashes         Assessment & Plan:

## 2014-11-20 NOTE — Assessment & Plan Note (Signed)
[facility] IMPACT research study: blinded >/= 52 week randomized trial of ANORO equivalent v BREO equivalent v GSK triple MDI (ICS + LABA + LAMA). Sponsored by [place] protocol number: [id number]  This visit 11/20/2014 is a research Visit and is for purpose of screening and is number 1 on PROTOCOL  Screening exam done no evidence of thrush.  Cont per research protocol .

## 2014-11-20 NOTE — Patient Instructions (Signed)
Continue per research protocol  

## 2014-11-20 NOTE — Progress Notes (Signed)
PFT done today. 

## 2014-11-23 DIAGNOSIS — H52223 Regular astigmatism, bilateral: Secondary | ICD-10-CM | POA: Diagnosis not present

## 2014-11-23 DIAGNOSIS — D3132 Benign neoplasm of left choroid: Secondary | ICD-10-CM | POA: Diagnosis not present

## 2014-11-23 DIAGNOSIS — H43813 Vitreous degeneration, bilateral: Secondary | ICD-10-CM | POA: Diagnosis not present

## 2014-11-23 DIAGNOSIS — H35372 Puckering of macula, left eye: Secondary | ICD-10-CM | POA: Diagnosis not present

## 2014-11-23 DIAGNOSIS — H527 Unspecified disorder of refraction: Secondary | ICD-10-CM | POA: Diagnosis not present

## 2014-11-23 DIAGNOSIS — H25813 Combined forms of age-related cataract, bilateral: Secondary | ICD-10-CM | POA: Diagnosis not present

## 2014-12-02 ENCOUNTER — Other Ambulatory Visit: Payer: Self-pay | Admitting: Internal Medicine

## 2014-12-02 DIAGNOSIS — R0600 Dyspnea, unspecified: Secondary | ICD-10-CM

## 2014-12-03 ENCOUNTER — Ambulatory Visit (INDEPENDENT_AMBULATORY_CARE_PROVIDER_SITE_OTHER): Payer: Medicare Other | Admitting: Internal Medicine

## 2014-12-03 DIAGNOSIS — R0600 Dyspnea, unspecified: Secondary | ICD-10-CM

## 2014-12-03 LAB — PULMONARY FUNCTION TEST
FEF 25-75 POST: 0.52 L/s
FEF 25-75 Pre: 0.56 L/sec
FEF2575-%Change-Post: -7 %
FEF2575-%Pred-Post: 28 %
FEF2575-%Pred-Pre: 31 %
FEV1-%CHANGE-POST: -4 %
FEV1-%PRED-PRE: 57 %
FEV1-%Pred-Post: 55 %
FEV1-POST: 1.22 L
FEV1-PRE: 1.28 L
FEV1FVC-%CHANGE-POST: -7 %
FEV1FVC-%Pred-Pre: 80 %
FEV6-%Change-Post: 3 %
FEV6-%Pred-Post: 75 %
FEV6-%Pred-Pre: 73 %
FEV6-Post: 2.1 L
FEV6-Pre: 2.03 L
FEV6FVC-%CHANGE-POST: 0 %
FEV6FVC-%PRED-POST: 102 %
FEV6FVC-%Pred-Pre: 101 %
FVC-%Change-Post: 4 %
FVC-%Pred-Post: 75 %
FVC-%Pred-Pre: 72 %
FVC-POST: 2.19 L
FVC-Pre: 2.11 L
PRE FEV1/FVC RATIO: 61 %
PRE FEV6/FVC RATIO: 97 %
Post FEV1/FVC ratio: 56 %
Post FEV6/FVC ratio: 97 %

## 2014-12-03 NOTE — Progress Notes (Signed)
Spirometry pre and post done today. 

## 2014-12-04 ENCOUNTER — Encounter: Payer: Self-pay | Admitting: Adult Health

## 2014-12-07 ENCOUNTER — Telehealth: Payer: Self-pay | Admitting: *Deleted

## 2014-12-07 ENCOUNTER — Telehealth: Payer: Self-pay | Admitting: Internal Medicine

## 2014-12-07 DIAGNOSIS — D3132 Benign neoplasm of left choroid: Secondary | ICD-10-CM | POA: Diagnosis not present

## 2014-12-07 DIAGNOSIS — H43813 Vitreous degeneration, bilateral: Secondary | ICD-10-CM | POA: Diagnosis not present

## 2014-12-07 DIAGNOSIS — H527 Unspecified disorder of refraction: Secondary | ICD-10-CM | POA: Diagnosis not present

## 2014-12-07 DIAGNOSIS — H35372 Puckering of macula, left eye: Secondary | ICD-10-CM | POA: Diagnosis not present

## 2014-12-07 DIAGNOSIS — H25813 Combined forms of age-related cataract, bilateral: Secondary | ICD-10-CM | POA: Diagnosis not present

## 2014-12-07 DIAGNOSIS — H52223 Regular astigmatism, bilateral: Secondary | ICD-10-CM | POA: Diagnosis not present

## 2014-12-07 NOTE — Telephone Encounter (Addendum)
Patient called, experiencing increased congestion, cough, "phlegm" production.  Changes noted since start of new study medication.  I Doreatha Martin, RN) reviewed with MR, MR believes likely is a flare needing treatment.  May or may not be related to study medication - no change in study medication.    Patient also seeing routine provider today.  Will be home after 2:00pm.

## 2014-12-07 NOTE — Telephone Encounter (Signed)
Spoke with patient regarding earlier call - advised to continue to take study medication as directed.  Advised patient that Pulmonary Triage would contact her regarding treatment for current symptoms.  Patient expressed thanks.  Doreatha Martin, RN

## 2014-12-07 NOTE — Telephone Encounter (Signed)
Triage - please tlet patient know that patient Erin Li  is having flare up of copd and could be coincidental to starting study medicine. More likely due to virus, pollen etc.,  Rec:  ZPAK Please take prednisone 40 mg x1 day, then 30 mg x1 day, then 20 mg x1 day, then 10 mg x1 day, and then 5 mg x1 day and stop Continue study drug    Dr. Brand Males, M.D., [facility].C.P Pulmonary and Critical Care Medicine Staff Physician [place] [facility] Pager: [phone], If no answer or between  15:00h - 7:00h: call [phone]  12/07/2014 2:02 PM

## 2014-12-07 NOTE — Telephone Encounter (Signed)
lmomtcb for pt -  Will route msg to [place] to ensure f/u (msg closed).

## 2014-12-08 MED ORDER — PREDNISONE 10 MG PO TABS: ORAL_TABLET | ORAL | Status: DC

## 2014-12-08 MED ORDER — AZITHROMYCIN 250 MG PO TABS: ORAL_TABLET | ORAL | Status: DC

## 2014-12-08 NOTE — Telephone Encounter (Signed)
New phone note open, patient returned call.  See most recent phone message.

## 2014-12-08 NOTE — Telephone Encounter (Signed)
Spoke with pt, she is aware of recs.  Verified pharmacy, meds sent in.  Nothing further needed.

## 2014-12-08 NOTE — Telephone Encounter (Signed)
LM to return call x 1  Brand Males, MD at 12/07/2014 2:00 PM     Status: Signed       Expand All Collapse All   Triage - please tlet patient know that patient Erin Li is having flare up of copd and could be coincidental to starting study medicine. More likely due to virus, pollen etc.,  Rec:  ZPAK Please take prednisone 40 mg x1 day, then 30 mg x1 day, then 20 mg x1 day, then 10 mg x1 day, and then 5 mg x1 day and stop Continue study drug    Dr. Brand Males, M.D., [facility].C.P Pulmonary and Critical Care Medicine Staff Physician [place] [facility] Pager: [phone], If no answer or between 15:00h - 7:00h: call [phone]  12/07/2014 2:02 PM               Clemetine Marker, RN at 12/07/2014 1:04 PM     Status: Addendum       Expand All Collapse All   Patient called, experiencing increased congestion, cough, "phlegm" production. Changes noted since start of new study medication. I Doreatha Martin, RN) reviewed with MR, MR believes likely is a flare needing treatment. May or may not be related to study medication - no change in study medication.    Patient also seeing routine provider today. Will be home after 2:00pm.

## 2014-12-08 NOTE — Telephone Encounter (Signed)
[phone], pt cb

## 2014-12-10 ENCOUNTER — Telehealth: Payer: Self-pay | Admitting: Internal Medicine

## 2014-12-10 ENCOUNTER — Ambulatory Visit (INDEPENDENT_AMBULATORY_CARE_PROVIDER_SITE_OTHER)
Admission: RE | Admit: 2014-12-10 | Discharge: 2014-12-10 | Disposition: A | Discharge status: Home / Self Care | Payer: Medicare Other | Source: Ambulatory Visit | Attending: Internal Medicine | Admitting: Internal Medicine

## 2014-12-10 DIAGNOSIS — J449 Chronic obstructive pulmonary disease, unspecified: Secondary | ICD-10-CM

## 2014-12-10 DIAGNOSIS — R911 Solitary pulmonary nodule: Secondary | ICD-10-CM

## 2014-12-10 DIAGNOSIS — R918 Other nonspecific abnormal finding of lung field: Secondary | ICD-10-CM | POA: Diagnosis not present

## 2014-12-10 NOTE — Telephone Encounter (Signed)
Ct Chest Wo Contrast  12/10/2014   CLINICAL DATA:  EVALUATION PULMONARY NODULE. HISTORY OF SMOKING. COPD.  EXAM: CT CHEST WITHOUT CONTRAST  TECHNIQUE: Multidetector CT imaging of the chest was performed following the standard protocol without IV contrast.  COMPARISON:  CT 08/07/2014, 05/05/2014  FINDINGS: Mediastinum/Nodes: No axillary or supraclavicular lymphadenopathy. No mediastinal hilar lymphadenopathy. Aortic calcifications present.  Lungs/Pleura: The new pulmonary nodule in the medial left upper lobe is resolved in the interval consistent with inflammatory process.  Again demonstrated multiple additional a pulmonary nodules which are not changed significantly from CT of 09/13/2012. Largest nodule within the superior segment of the right lower lobe measures 9 mm unchanged from 8 mm. Subpleural nodule right lower lobe measures 5 mm (image 41 series 3) is unchanged. Nodule over the left hemidiaphragm laterally is relatively stable additionally. Linear nodule in the right middle lobe on image 35, series 3 is stable back to 04/17/2013  Upper abdomen: Limited view of the liver, kidneys, pancreas are unremarkable. Normal adrenal glands.  Musculoskeletal: No aggressive osseous lesion.  IMPRESSION: 1. Resolution of the left upper lobe nodule which was new on most recent comparison exam. 2. Additional bilateral pulmonary nodules are stable over 2 year interval consistent with benign etiology per Fleischner criteria.   Electronically Signed   By: [name] M.D.   On: 12/10/2014 10:04     Erin Li,    Lung nodule - new lung nodule dec 2015 in left upper lobe - 65mm - has resolved 12/10/2014 ,  - 74mm RLL superior segment lung nodule stable July 2013 through Dec 2015 and now 12/10/2014 also stable . lung nodule likely benign  - fu with TP later in May 2016   Dr. Brand Males, M.D., [facility].C.P Pulmonary and Critical Care Medicine Staff Physician [place] [facility]  [facility] Pager: [phone], If no answer or between  15:00h - 7:00h: call [phone]  12/10/2014 4:03 PM

## 2014-12-11 NOTE — Telephone Encounter (Signed)
Called and spoke to pt. Informed pt of the results and recs per MR. Appt made with TP on 5/24. Pt verbalized understanding and denied any further questions or concerns at this time.

## 2014-12-17 DIAGNOSIS — H52221 Regular astigmatism, right eye: Secondary | ICD-10-CM | POA: Diagnosis not present

## 2014-12-17 DIAGNOSIS — H25811 Combined forms of age-related cataract, right eye: Secondary | ICD-10-CM | POA: Diagnosis not present

## 2014-12-17 DIAGNOSIS — Z9861 Coronary angioplasty status: Secondary | ICD-10-CM | POA: Diagnosis not present

## 2014-12-17 DIAGNOSIS — H43813 Vitreous degeneration, bilateral: Secondary | ICD-10-CM | POA: Diagnosis not present

## 2014-12-17 DIAGNOSIS — I25119 Atherosclerotic heart disease of native coronary artery with unspecified angina pectoris: Secondary | ICD-10-CM | POA: Diagnosis not present

## 2014-12-17 DIAGNOSIS — Z79899 Other long term (current) drug therapy: Secondary | ICD-10-CM | POA: Diagnosis not present

## 2014-12-17 DIAGNOSIS — J449 Chronic obstructive pulmonary disease, unspecified: Secondary | ICD-10-CM | POA: Diagnosis not present

## 2014-12-17 DIAGNOSIS — D3132 Benign neoplasm of left choroid: Secondary | ICD-10-CM | POA: Diagnosis not present

## 2014-12-17 DIAGNOSIS — Z87891 Personal history of nicotine dependence: Secondary | ICD-10-CM | POA: Diagnosis not present

## 2014-12-17 DIAGNOSIS — H52223 Regular astigmatism, bilateral: Secondary | ICD-10-CM | POA: Diagnosis not present

## 2014-12-17 DIAGNOSIS — H527 Unspecified disorder of refraction: Secondary | ICD-10-CM | POA: Diagnosis not present

## 2014-12-17 DIAGNOSIS — H35372 Puckering of macula, left eye: Secondary | ICD-10-CM | POA: Diagnosis not present

## 2014-12-17 DIAGNOSIS — H2513 Age-related nuclear cataract, bilateral: Secondary | ICD-10-CM | POA: Diagnosis not present

## 2015-01-06 ENCOUNTER — Encounter: Payer: Medicare Other | Admitting: Adult Health

## 2015-01-07 ENCOUNTER — Encounter: Payer: Self-pay | Admitting: Adult Health

## 2015-01-07 ENCOUNTER — Ambulatory Visit (INDEPENDENT_AMBULATORY_CARE_PROVIDER_SITE_OTHER): Payer: Medicare Other | Admitting: Internal Medicine

## 2015-01-07 ENCOUNTER — Other Ambulatory Visit: Payer: Self-pay | Admitting: Internal Medicine

## 2015-01-07 ENCOUNTER — Ambulatory Visit (INDEPENDENT_AMBULATORY_CARE_PROVIDER_SITE_OTHER): Payer: Medicare Other | Admitting: Adult Health

## 2015-01-07 VITALS — BP 120/70 | HR 62 | Temp 97.6°F | Resp 16 | Wt 151.0 lb

## 2015-01-07 DIAGNOSIS — J449 Chronic obstructive pulmonary disease, unspecified: Secondary | ICD-10-CM

## 2015-01-07 DIAGNOSIS — R0600 Dyspnea, unspecified: Secondary | ICD-10-CM

## 2015-01-07 DIAGNOSIS — Z006 Encounter for examination for normal comparison and control in clinical research program: Secondary | ICD-10-CM

## 2015-01-07 NOTE — Progress Notes (Signed)
[id number]: GSK IMPACT research study: blinded >/= 52 week randomized trial of ANORO equivalent v BREO equivalent v GSK triple MDI (ICS + LABA + LAMA). Sponsored by [employer]   This visit 01/07/2015 for Erin Li with Sep 09, 1940 who is subject number [id number] is a Research Visit and is for purpose of follow up, and is Visit 3 on PROTOCOL

## 2015-01-07 NOTE — Patient Instructions (Signed)
Continue on research protocol .  

## 2015-01-07 NOTE — Progress Notes (Signed)
Spirometry pre and post done today. 

## 2015-01-07 NOTE — Assessment & Plan Note (Signed)
COPD stable in research study

## 2015-01-07 NOTE — Assessment & Plan Note (Signed)
[id number]: GSK IMPACT research study: blinded >/= 52 week randomized trial of ANORO equivalent v BREO equivalent v GSK triple MDI (ICS + LABA + LAMA). Sponsored by [place]   This visit 01/07/2015 for Erin Li (30-Nov-1940) who is subject number [id number] is a Research Visit and is for purpose of follow up, and is Visit 3 on PROTOCOL  Doing well in research study Continue research study protocol

## 2015-01-07 NOTE — Progress Notes (Signed)
   Subjective:    Patient ID: Cedric Denison, female    DOB: 1941-06-16, 74 y.o.   MRN: [id number]  HPI [id number]: GSK IMPACT research study: blinded >/= 52 week randomized trial of ANORO equivalent v BREO equivalent v GSK triple MDI (ICS + LABA + LAMA). Sponsored by [place]   This visit 01/07/2015 for [name] ([birth year]) who is subject number [id number] is a Research Visit and is for purpose of follow up, and is Visit 3 on PROTOCOL  Doing well on treatment , no flare of cough, wheezing , dyspnea or act tolerance.   Review of Systems See HPI     Objective:   Physical Exam GEN: A/Ox3; pleasant , NAD,    HEENT:  No oral candidiasis     RESP  Decreased BS in bases   CARD:  RRR, no m/r/g  ,

## 2015-01-08 LAB — PULMONARY FUNCTION TEST
FEF 25-75 POST: 0.49 L/s
FEF 25-75 PRE: 0.53 L/s
FEF2575-%Change-Post: -8 %
FEF2575-%PRED-POST: 27 %
FEF2575-%PRED-PRE: 29 %
FEV1-%Change-Post: 0 %
FEV1-%PRED-POST: 59 %
FEV1-%PRED-PRE: 58 %
FEV1-POST: 1.3 L
FEV1-Pre: 1.29 L
FEV1FVC-%Change-Post: 3 %
FEV1FVC-%Pred-Pre: 78 %
FEV6-%CHANGE-POST: -3 %
FEV6-%Pred-Post: 73 %
FEV6-%Pred-Pre: 76 %
FEV6-PRE: 2.13 L
FEV6-Post: 2.05 L
FEV6FVC-%CHANGE-POST: 0 %
FEV6FVC-%PRED-POST: 99 %
FEV6FVC-%Pred-Pre: 100 %
FVC-%Change-Post: -2 %
FVC-%Pred-Post: 73 %
FVC-%Pred-Pre: 75 %
FVC-POST: 2.14 L
FVC-PRE: 2.2 L
POST FEV1/FVC RATIO: 61 %
PRE FEV6/FVC RATIO: 97 %
Post FEV6/FVC ratio: 96 %
Pre FEV1/FVC ratio: 59 %

## 2015-01-26 ENCOUNTER — Ambulatory Visit (INDEPENDENT_AMBULATORY_CARE_PROVIDER_SITE_OTHER): Payer: Medicare Other | Admitting: Adult Health

## 2015-01-26 ENCOUNTER — Encounter: Payer: Self-pay | Admitting: Adult Health

## 2015-01-26 VITALS — BP 118/70 | HR 89 | Temp 97.6°F | Ht 66.0 in | Wt 149.0 lb

## 2015-01-26 DIAGNOSIS — J449 Chronic obstructive pulmonary disease, unspecified: Secondary | ICD-10-CM | POA: Diagnosis not present

## 2015-01-26 DIAGNOSIS — Z23 Encounter for immunization: Secondary | ICD-10-CM

## 2015-01-26 DIAGNOSIS — R911 Solitary pulmonary nodule: Secondary | ICD-10-CM

## 2015-01-26 NOTE — Progress Notes (Signed)
Subjective:    Patient ID: Erin Li, female    DOB: 06/19/1941, 74 y.o.   MRN: [id number]  HPI   #  From [place] but have retired and settle in [place]  #hx of smoking:  STarted smoking at age 26-14. Quit smoking 8 months ago. Inspired to quit following son's  (aged 52) lung transplant in 2011 at [facility] for pulmonary fibrosis following exposure to glsss particles at [employer] Artery calciffication  -   Atherosclerotic calcifications in the coronary arteries including the left main and LAD. Aortic valvular calcification - July 2013 [facility] CT  #Lung nodule   - INDETERMINATE  -  CHEST CTA 03/20/12 at [facility]: from care everywhere but image N/A   . Lungs: 7 mm nodule in the superior aspect of the right lower lobe (series 3, image 47). Additional 5 mm nodule in the right lower lobe (series 3, image 75). 5 mm pleural-based nodule in the peripheral right lower lobe (series 3, image 80). Linear atelectasis in the lingula. . Pleura: Small left pneumothorax. Mild apical predominant centrilobular and paraseptal emphysema.  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 The confidence level is 84%.  - CT chest 04/17/13 shows no change in nodule compared to Jan 2014 at [facility] and likely no change comapredd to July 2013 at [facility].  #COPD  - Spirometry   fev1 0.93L/39%, Ratio 50 - Gold stage 3 COPD. she is only on albuterol as needed. She is reluctant to take inhaled corticosteroids.. STarted on spiriva late 2013     OV 04/30/2014 Chief Complaint  Patient presents with  . Follow-up    Pt last seen on 09/2012 by MR. Pt here to review CT that was done in 04/2014. Pt states her breathing worsens with the hot temp. Pt c/o prod cough with clear mucous. Pt denies CP/tightness.    Followup follow the  above issues. Not seen her since January 2014 which is over 18 months ago  Smoking :  reports that she quit smoking about 2 years ago. She does not have any smokeless tobacco history on file.  Lung nodule: Chest 7 mm right lower lobe superior aspect lung nodule that is stable to be July 2013 and August 2014 in one year. She was supposed to have followup CT scan of the chest earlier this year but so far has not. We will reorder it   COPD: Gold stage III COPD. At last visit 18 months ago due to her reluctance to take inhaled steroids. I kept on maintenance Spirivat she  she takes it only as needed. She and her husband was out in the gym and walk a total of couple of miles a day. Now th husband does notice increased dyspnea which the patient denies but both agree that she has increased cough, chest tightness and increase in sputum volume but no change in sputum color. She attributes this to the heat and humidity. No sick contacts. She continues to take Spiriva only as needed.     #COPD   - I thnk in flare up due to heat and lack of daily spiriva  - Please REstart spiriva 1 puff daily - take  script and show technique -Take prednisone 40 mg daily x 2 days, then 20mg   daily x 2 days, then 10mg  daily x 2 days, then 5mg  daily x 2 days and stop - have flu shot when available in cmmunity - consider prevnar and alpha 1 check at followup if not done (last pneumovax was Sep 24, 2012)   #Lung nodule  - High Resolution CT chest without contrast on ILD protocol for lung nodule and crackles on exam  #Followup  < 3 weeks after CT chest with me or my NP Tamm  OV 08/07/2014  Chief Complaint  Patient presents with  . Follow-up    Pt here after CT and PFT. Pt states she felt better after taking pred last visit. Pt c/o DOE, mild dry cough. Pt denies CP/tightness.      Smoking :   reports that she quit smoking about 2 years ago. She does not have any smokeless tobacco history on file.   Lung nodule: Chest  7 mm right lower lobe superior aspect lung nodule that is stable to be July 2013 and August 2014 in one year.  Had fu scan Aug 2015 and this suggested some prominence to this nodule. So she had scan 08/07/2014 - shows nodule is unchanged. At 87mm. However, there is a NEW LUL 82mm nodule 08/07/2014    COPD: Gold stage III COPD previous diagnosis but that was on spiriva prn. Now she takes it 4 times per week. PFT shows gold stage 2 copd - post BD - fev1 1.18L/53%, (13% BD response), Ratio 54, DLCPO 14.8/61%. She feels well. In sumer she went kayaking Denies any aecopd since last visit but on exam today still has som scattered wheeze    01/26/2015  Follow up : Gold stage III COPD/Lung nodule  Pt returns for 4 month follow up  Doing well , has occasional cough with clear mucus.  Feels good, very active.  No flare of cough or wheezing.   Is in IMPACT study-says she is doing well.   PVX is utd, Never had prevnar.   Has known lung nodule . Follow up CT chest 12/2014 showed resolution of  LUL nodule and stable bilateral nodules over 2 years (09/2012 )   Denies chest pain, orthopnea, edema , fever or hemoptysis .      Review of Systems  Constitutional: Negative for fever and unexpected weight change.  HENT: Negative for congestion, dental problem, ear pain, nosebleeds, postnasal drip, rhinorrhea, sinus pressure, sneezing, sore throat and trouble swallowing.   Eyes: Negative for redness and itching.  Respiratory: Negative for chest tightness and wheezing.   Cardiovascular: Negative for palpitations and leg swelling.  Gastrointestinal: Negative for nausea and vomiting.  Genitourinary: Negative for dysuria.  Musculoskeletal: Negative for joint swelling.  Skin: Negative for rash.  Neurological: Negative for headaches.  Hematological: Does not bruise/bleed easily.  Psychiatric/Behavioral: Negative for dysphoric mood. The patient is not nervous/anxious.         Objective:   Physical Exam    Filed Vitals:   01/26/15 0900  BP: 118/70  Pulse: 89  Temp: 97.6 F (36.4 C)   Constitutional: She is oriented to person, place, and time. She appears well-developed and well-nourished. No distress.   HENT:  Head: Normocephalic and atraumatic.  Right Ear: External ear normal.  Left Ear: External ear normal.  Mouth/Throat: Oropharynx is clear and moist. No oropharyngeal exudate.  Eyes: Conjunctivae and EOM are normal. Pupils are equal, round, and reactive to light. Right eye exhibits no discharge. Left eye exhibits no discharge. No scleral icterus.  Neck: Normal  range of motion. Neck supple. No JVD present. No tracheal deviation present. No thyromegaly present.  Cardiovascular: Normal rate, regular rhythm, normal heart sounds and intact distal pulses.  Exam reveals no gallop and no friction rub.   No murmur heard. Pulmonary/Chest: Effort normal. No respiratory distress.. She has no rales. She exhibits no tenderness.  Abdominal: Soft. Bowel sounds are normal. She exhibits no distension and no mass. There is no tenderness. There is no rebound and no guarding.  Musculoskeletal: Normal range of motion. She exhibits no edema or tenderness.  Lymphadenopathy:    She has no cervical adenopathy.  Neurological: She is alert and oriented to person, place, and time. She has normal reflexes. No cranial nerve deficit. She exhibits normal muscle tone. Coordination normal.  Skin: Skin is warm and dry. No rash noted. She is not diaphoretic. No erythema. No pallor.  Psychiatric: She has a normal mood and affect. Her behavior is normal. Judgment and thought content normal.

## 2015-01-26 NOTE — Assessment & Plan Note (Signed)
Compensated on present regimen  Cont with current regimen  Prevnar vaccine today with pt education  follow up in 4 months

## 2015-01-26 NOTE — Assessment & Plan Note (Signed)
Reviewed recent CT chest in April with pt  Stable lung nodules appear stable over 2 yr c/w benign etiology

## 2015-01-26 NOTE — Patient Instructions (Signed)
Keep up good work  Dietitian vaccine today .  Follow up Dr. Chase Caller in 4 months and As needed

## 2015-01-26 NOTE — Addendum Note (Signed)
Addended by: Oscar La R on: 01/26/2015 10:12 AM   Modules accepted: Orders

## 2015-01-28 DIAGNOSIS — I2510 Atherosclerotic heart disease of native coronary artery without angina pectoris: Secondary | ICD-10-CM | POA: Diagnosis not present

## 2015-01-28 DIAGNOSIS — H25812 Combined forms of age-related cataract, left eye: Secondary | ICD-10-CM | POA: Diagnosis not present

## 2015-01-28 DIAGNOSIS — Z9841 Cataract extraction status, right eye: Secondary | ICD-10-CM | POA: Diagnosis not present

## 2015-01-28 DIAGNOSIS — Z87891 Personal history of nicotine dependence: Secondary | ICD-10-CM | POA: Diagnosis not present

## 2015-01-28 DIAGNOSIS — H52222 Regular astigmatism, left eye: Secondary | ICD-10-CM | POA: Diagnosis not present

## 2015-01-28 DIAGNOSIS — J449 Chronic obstructive pulmonary disease, unspecified: Secondary | ICD-10-CM | POA: Diagnosis not present

## 2015-01-28 DIAGNOSIS — Z961 Presence of intraocular lens: Secondary | ICD-10-CM | POA: Diagnosis not present

## 2015-01-28 DIAGNOSIS — H43813 Vitreous degeneration, bilateral: Secondary | ICD-10-CM | POA: Diagnosis not present

## 2015-01-28 DIAGNOSIS — H35372 Puckering of macula, left eye: Secondary | ICD-10-CM | POA: Diagnosis not present

## 2015-03-01 DIAGNOSIS — H43813 Vitreous degeneration, bilateral: Secondary | ICD-10-CM | POA: Diagnosis not present

## 2015-03-01 DIAGNOSIS — H35373 Puckering of macula, bilateral: Secondary | ICD-10-CM | POA: Diagnosis not present

## 2015-03-01 DIAGNOSIS — H35352 Cystoid macular degeneration, left eye: Secondary | ICD-10-CM | POA: Diagnosis not present

## 2015-03-01 DIAGNOSIS — H527 Unspecified disorder of refraction: Secondary | ICD-10-CM | POA: Diagnosis not present

## 2015-03-01 DIAGNOSIS — Z961 Presence of intraocular lens: Secondary | ICD-10-CM | POA: Diagnosis not present

## 2015-03-10 DIAGNOSIS — H59032 Cystoid macular edema following cataract surgery, left eye: Secondary | ICD-10-CM | POA: Diagnosis not present

## 2015-03-10 DIAGNOSIS — H35372 Puckering of macula, left eye: Secondary | ICD-10-CM | POA: Diagnosis not present

## 2015-03-10 DIAGNOSIS — Z79899 Other long term (current) drug therapy: Secondary | ICD-10-CM | POA: Diagnosis not present

## 2015-03-10 DIAGNOSIS — H35373 Puckering of macula, bilateral: Secondary | ICD-10-CM | POA: Diagnosis not present

## 2015-03-10 DIAGNOSIS — Z87891 Personal history of nicotine dependence: Secondary | ICD-10-CM | POA: Diagnosis not present

## 2015-03-10 DIAGNOSIS — H59212 Accidental puncture and laceration of left eye and adnexa during an ophthalmic procedure: Secondary | ICD-10-CM | POA: Diagnosis not present

## 2015-03-10 DIAGNOSIS — H3581 Retinal edema: Secondary | ICD-10-CM | POA: Diagnosis not present

## 2015-03-10 DIAGNOSIS — J449 Chronic obstructive pulmonary disease, unspecified: Secondary | ICD-10-CM | POA: Diagnosis not present

## 2015-03-10 DIAGNOSIS — I10 Essential (primary) hypertension: Secondary | ICD-10-CM | POA: Diagnosis not present

## 2015-03-10 DIAGNOSIS — I2510 Atherosclerotic heart disease of native coronary artery without angina pectoris: Secondary | ICD-10-CM | POA: Diagnosis not present

## 2015-03-10 DIAGNOSIS — H35352 Cystoid macular degeneration, left eye: Secondary | ICD-10-CM | POA: Diagnosis not present

## 2015-03-18 DIAGNOSIS — Z961 Presence of intraocular lens: Secondary | ICD-10-CM | POA: Diagnosis not present

## 2015-03-18 DIAGNOSIS — H527 Unspecified disorder of refraction: Secondary | ICD-10-CM | POA: Diagnosis not present

## 2015-03-18 DIAGNOSIS — H35373 Puckering of macula, bilateral: Secondary | ICD-10-CM | POA: Diagnosis not present

## 2015-03-18 DIAGNOSIS — H43813 Vitreous degeneration, bilateral: Secondary | ICD-10-CM | POA: Diagnosis not present

## 2015-03-18 DIAGNOSIS — H33312 Horseshoe tear of retina without detachment, left eye: Secondary | ICD-10-CM | POA: Diagnosis not present

## 2015-03-18 DIAGNOSIS — H35352 Cystoid macular degeneration, left eye: Secondary | ICD-10-CM | POA: Diagnosis not present

## 2015-04-06 ENCOUNTER — Other Ambulatory Visit: Payer: Self-pay | Admitting: *Deleted

## 2015-04-06 ENCOUNTER — Other Ambulatory Visit: Payer: Self-pay | Admitting: Internal Medicine

## 2015-04-06 DIAGNOSIS — R0600 Dyspnea, unspecified: Secondary | ICD-10-CM

## 2015-04-07 ENCOUNTER — Ambulatory Visit (INDEPENDENT_AMBULATORY_CARE_PROVIDER_SITE_OTHER): Payer: Medicare Other | Admitting: Adult Health

## 2015-04-07 ENCOUNTER — Ambulatory Visit (INDEPENDENT_AMBULATORY_CARE_PROVIDER_SITE_OTHER): Payer: Medicare Other | Admitting: Internal Medicine

## 2015-04-07 ENCOUNTER — Encounter: Payer: Self-pay | Admitting: Adult Health

## 2015-04-07 VITALS — BP 122/80 | HR 60 | Temp 98.0°F | Resp 17 | Wt 146.2 lb

## 2015-04-07 DIAGNOSIS — J449 Chronic obstructive pulmonary disease, unspecified: Secondary | ICD-10-CM

## 2015-04-07 DIAGNOSIS — R0600 Dyspnea, unspecified: Secondary | ICD-10-CM

## 2015-04-07 DIAGNOSIS — Z006 Encounter for examination for normal comparison and control in clinical research program: Secondary | ICD-10-CM

## 2015-04-07 LAB — PULMONARY FUNCTION TEST
FEF 25-75 POST: 0.59 L/s
FEF 25-75 Pre: 0.55 L/sec
FEF2575-%CHANGE-POST: 7 %
FEF2575-%Pred-Post: 33 %
FEF2575-%Pred-Pre: 31 %
FEV1-%CHANGE-POST: 5 %
FEV1-%PRED-PRE: 59 %
FEV1-%Pred-Post: 62 %
FEV1-Post: 1.38 L
FEV1-Pre: 1.31 L
FEV1FVC-%Change-Post: 4 %
FEV1FVC-%PRED-PRE: 77 %
FEV6-%Change-Post: 1 %
FEV6-%PRED-POST: 80 %
FEV6-%Pred-Pre: 79 %
FEV6-POST: 2.23 L
FEV6-PRE: 2.19 L
FEV6FVC-%Change-Post: 0 %
FEV6FVC-%Pred-Post: 102 %
FEV6FVC-%Pred-Pre: 101 %
FVC-%Change-Post: 1 %
FVC-%PRED-POST: 78 %
FVC-%Pred-Pre: 77 %
FVC-POST: 2.27 L
FVC-Pre: 2.25 L
PRE FEV1/FVC RATIO: 58 %
Post FEV1/FVC ratio: 60 %
Post FEV6/FVC ratio: 98 %
Pre FEV6/FVC Ratio: 97 %

## 2015-04-07 NOTE — Progress Notes (Signed)
Spirometry pre and post done today. 

## 2015-04-07 NOTE — Progress Notes (Signed)
[id number]: GSK IMPACT research study: blinded >/= 52 week randomized trial of ANORO equivalent v BREO equivalent v GSK triple MDI (ICS + LABA + LAMA). Sponsored by [place]   This visit 04/07/2015 for Erin Li with [birth year] who is subject number [id number] is a research Visit and is for purpose of follow up and is number  4  on PROTOCOL.  Subject is doing well, she forgot her HUR form, but does not report any changes at this time.  She brought all study drug as instructed and is 100% compliant.  Will return in November for V5.

## 2015-04-07 NOTE — Assessment & Plan Note (Signed)
Compensated without flare  Cont on current regimen  

## 2015-04-07 NOTE — Patient Instructions (Signed)
Cont w/ research protocol  

## 2015-04-07 NOTE — Progress Notes (Signed)
   Subjective:    Patient ID: Erin Li, female    DOB: 27-Nov-1940, 74 y.o.   MRN: [id number]  HPI  [id number]: GSK IMPACT research study: blinded >/= 52 week randomized trial of ANORO equivalent v BREO equivalent v GSK triple MDI (ICS + LABA + LAMA). Sponsored by [place]     04/07/2015 IMPACT Research Study visit- #4.  Doing well on treatment , no flare of cough, wheezing , dyspnea or act tolerance.  Does complains of some drainage in throat .  Discussed saline nasal rinses and /or claritin As needed   100% compliant .    Review of Systems  See HPI     Objective:   Physical Exam  GEN: A/Ox3; pleasant , NAD,    HEENT:  No oral candidiasis     RESP  Decreased BS in bases   CARD:  RRR, no m/r/g  ,

## 2015-04-07 NOTE — Assessment & Plan Note (Signed)
[id number]: GSK IMPACT research study: blinded >/= 52 week randomized trial of ANORO equivalent v BREO equivalent v GSK triple MDI (ICS + LABA + LAMA). Sponsored by [employer] visit #4 , doing well on therapy without complaint  No evidence of thrush on exam

## 2015-06-01 DIAGNOSIS — H527 Unspecified disorder of refraction: Secondary | ICD-10-CM | POA: Diagnosis not present

## 2015-06-01 DIAGNOSIS — H35352 Cystoid macular degeneration, left eye: Secondary | ICD-10-CM | POA: Diagnosis not present

## 2015-06-01 DIAGNOSIS — Z961 Presence of intraocular lens: Secondary | ICD-10-CM | POA: Diagnosis not present

## 2015-06-01 DIAGNOSIS — H33312 Horseshoe tear of retina without detachment, left eye: Secondary | ICD-10-CM | POA: Diagnosis not present

## 2015-06-01 DIAGNOSIS — H35373 Puckering of macula, bilateral: Secondary | ICD-10-CM | POA: Diagnosis not present

## 2015-06-01 DIAGNOSIS — H43813 Vitreous degeneration, bilateral: Secondary | ICD-10-CM | POA: Diagnosis not present

## 2015-06-11 DIAGNOSIS — Z23 Encounter for immunization: Secondary | ICD-10-CM | POA: Diagnosis not present

## 2015-07-09 ENCOUNTER — Ambulatory Visit (INDEPENDENT_AMBULATORY_CARE_PROVIDER_SITE_OTHER): Payer: Medicare Other | Admitting: Internal Medicine

## 2015-07-09 ENCOUNTER — Ambulatory Visit (INDEPENDENT_AMBULATORY_CARE_PROVIDER_SITE_OTHER): Payer: Medicare Other | Admitting: Adult Health

## 2015-07-09 ENCOUNTER — Encounter: Payer: Self-pay | Admitting: Adult Health

## 2015-07-09 VITALS — BP 122/80 | HR 64 | Temp 97.5°F | Resp 16 | Wt 151.0 lb

## 2015-07-09 DIAGNOSIS — R0600 Dyspnea, unspecified: Secondary | ICD-10-CM

## 2015-07-09 DIAGNOSIS — J449 Chronic obstructive pulmonary disease, unspecified: Secondary | ICD-10-CM

## 2015-07-09 DIAGNOSIS — Z006 Encounter for examination for normal comparison and control in clinical research program: Secondary | ICD-10-CM

## 2015-07-09 LAB — PULMONARY FUNCTION TEST
FEF 25-75 POST: 0.68 L/s
FEF 25-75 Pre: 0.59 L/sec
FEF2575-%Change-Post: 14 %
FEF2575-%PRED-PRE: 33 %
FEF2575-%Pred-Post: 38 %
FEV1-%Change-Post: 4 %
FEV1-%PRED-POST: 65 %
FEV1-%PRED-PRE: 62 %
FEV1-Post: 1.42 L
FEV1-Pre: 1.35 L
FEV1FVC-%CHANGE-POST: 4 %
FEV1FVC-%PRED-PRE: 79 %
FEV6-%Change-Post: 1 %
FEV6-%Pred-Post: 79 %
FEV6-%Pred-Pre: 78 %
FEV6-POST: 2.19 L
FEV6-Pre: 2.16 L
FEV6FVC-%Change-Post: 1 %
FEV6FVC-%PRED-POST: 102 %
FEV6FVC-%Pred-Pre: 101 %
FVC-%Change-Post: 0 %
FVC-%Pred-Post: 78 %
FVC-%Pred-Pre: 78 %
FVC-POST: 2.27 L
FVC-PRE: 2.26 L
PRE FEV1/FVC RATIO: 60 %
Post FEV1/FVC ratio: 63 %
Post FEV6/FVC ratio: 99 %
Pre FEV6/FVC Ratio: 97 %

## 2015-07-09 NOTE — Progress Notes (Signed)
[id number]: GSK IMPACT research study: blinded >/= 52 week randomized trial of ANORO equivalent v BREO equivalent v GSK triple MDI (ICS + LABA + LAMA). Sponsored by [place]   This visit 07/09/2015 for Erin Li with 1941/06/07 who is subject number [id number] is a research Visit and is for purpose of follow up and is number 5 on PROTOCOL.  Subject returns with no complaints or changes in medication.  She did not bring 1 inhaler with her today.  She was instructed not to use anymore but use the study drug given today.  All protocol procedures have been completed as required, she will return in Feb 2017 for visit 6.

## 2015-07-09 NOTE — Progress Notes (Signed)
PFT done today. 

## 2015-07-09 NOTE — Assessment & Plan Note (Signed)
Cont on research protocol  

## 2015-07-09 NOTE — Patient Instructions (Signed)
Continue on research protocol .  

## 2015-07-09 NOTE — Assessment & Plan Note (Signed)
Compensated without flare  Cont w/ research protocol

## 2015-07-09 NOTE — Progress Notes (Signed)
   Subjective:    Patient ID: Erin Li, female    DOB: 1941-01-25, 74 y.o.   MRN: [id number]  HPI  [id number]: GSK IMPACT research study: blinded >/= 52 week randomized trial of ANORO equivalent v BREO equivalent v GSK triple MDI (ICS + LABA + LAMA). Sponsored by [place]     07/09/2015 IMPACT Research Study visit- #5.  Doing well on treatment , no flare of cough, wheezing , dyspnea or act tolerance.  Does complains of some mucus  in throat . Claritin did not help. Comes and goes.     Review of Systems  See HPI     Objective:   Physical Exam  GEN: A/Ox3; pleasant , NAD,    HEENT:  No oral candidiasis     RESP  Decreased BS in bases   CARD:  RRR, no m/r/g  ,

## 2015-08-12 DIAGNOSIS — F419 Anxiety disorder, unspecified: Secondary | ICD-10-CM | POA: Diagnosis not present

## 2015-08-12 DIAGNOSIS — I10 Essential (primary) hypertension: Secondary | ICD-10-CM | POA: Diagnosis not present

## 2015-08-12 DIAGNOSIS — I209 Angina pectoris, unspecified: Secondary | ICD-10-CM | POA: Diagnosis not present

## 2015-08-12 DIAGNOSIS — J449 Chronic obstructive pulmonary disease, unspecified: Secondary | ICD-10-CM | POA: Diagnosis not present

## 2015-08-12 DIAGNOSIS — R739 Hyperglycemia, unspecified: Secondary | ICD-10-CM | POA: Diagnosis not present

## 2015-09-29 DIAGNOSIS — I10 Essential (primary) hypertension: Secondary | ICD-10-CM | POA: Diagnosis not present

## 2015-09-29 DIAGNOSIS — L989 Disorder of the skin and subcutaneous tissue, unspecified: Secondary | ICD-10-CM | POA: Diagnosis not present

## 2015-09-29 DIAGNOSIS — F419 Anxiety disorder, unspecified: Secondary | ICD-10-CM | POA: Diagnosis not present

## 2015-09-29 DIAGNOSIS — Z780 Asymptomatic menopausal state: Secondary | ICD-10-CM | POA: Diagnosis not present

## 2015-09-30 ENCOUNTER — Other Ambulatory Visit: Payer: Self-pay | Admitting: Internal Medicine

## 2015-09-30 DIAGNOSIS — R0600 Dyspnea, unspecified: Secondary | ICD-10-CM

## 2015-10-01 ENCOUNTER — Ambulatory Visit (INDEPENDENT_AMBULATORY_CARE_PROVIDER_SITE_OTHER): Payer: Medicare Other | Admitting: Adult Health

## 2015-10-01 ENCOUNTER — Encounter: Payer: Self-pay | Admitting: Adult Health

## 2015-10-01 ENCOUNTER — Ambulatory Visit (INDEPENDENT_AMBULATORY_CARE_PROVIDER_SITE_OTHER): Payer: Medicare Other | Admitting: Internal Medicine

## 2015-10-01 VITALS — BP 128/78 | HR 77 | Temp 97.6°F | Resp 16 | Wt 155.2 lb

## 2015-10-01 DIAGNOSIS — J449 Chronic obstructive pulmonary disease, unspecified: Secondary | ICD-10-CM

## 2015-10-01 DIAGNOSIS — R0600 Dyspnea, unspecified: Secondary | ICD-10-CM

## 2015-10-01 DIAGNOSIS — Z006 Encounter for examination for normal comparison and control in clinical research program: Secondary | ICD-10-CM

## 2015-10-01 LAB — PULMONARY FUNCTION TEST
FEF 25-75 Post: 0.65 L/sec
FEF 25-75 Pre: 0.64 L/sec
FEF2575-%Change-Post: 1 %
FEF2575-%PRED-PRE: 36 %
FEF2575-%Pred-Post: 36 %
FEV1-%Change-Post: 3 %
FEV1-%PRED-PRE: 64 %
FEV1-%Pred-Post: 67 %
FEV1-PRE: 1.41 L
FEV1-Post: 1.46 L
FEV1FVC-%Change-Post: 8 %
FEV1FVC-%Pred-Pre: 79 %
FEV6-%Change-Post: 0 %
FEV6-%Pred-Post: 81 %
FEV6-%Pred-Pre: 81 %
FEV6-POST: 2.23 L
FEV6-Pre: 2.24 L
FEV6FVC-%Change-Post: 4 %
FEV6FVC-%PRED-POST: 104 %
FEV6FVC-%PRED-PRE: 100 %
FVC-%CHANGE-POST: -4 %
FVC-%PRED-PRE: 81 %
FVC-%Pred-Post: 77 %
FVC-POST: 2.24 L
FVC-PRE: 2.35 L
POST FEV6/FVC RATIO: 99 %
PRE FEV1/FVC RATIO: 60 %
Post FEV1/FVC ratio: 65 %
Pre FEV6/FVC Ratio: 95 %

## 2015-10-01 NOTE — Patient Instructions (Signed)
Continue with research protocol.  

## 2015-10-01 NOTE — Assessment & Plan Note (Signed)
Compensated on present regimen  [place] , advised to [place] with PCP and Pulmonary  Once established will need records.  She is finishing research study here so will need to transition back to maintenance regimen once completed.

## 2015-10-01 NOTE — Assessment & Plan Note (Signed)
[id number]: GSK IMPACT research study: blinded >/= 52 week randomized trial of ANORO equivalent v BREO equivalent v GSK triple MDI (ICS + LABA + LAMA). Sponsored by [place]   She is doing well in study, denies complaints.

## 2015-10-01 NOTE — Progress Notes (Signed)
   Subjective:    Patient ID: Erin Li, female    DOB: [birth year], 75 y.o.   MRN: [id number]  HPI 75 yo female former smoker  with GOLD III COPD   [id number]: GSK IMPACT research study: blinded >/= 52 week randomized trial of ANORO equivalent v BREO equivalent v GSK triple MDI (ICS + LABA + LAMA). Sponsored by [place]     10/01/2015 IMPACT Research Study visit- #6 Doing well on treatment , no flare of cough, wheezing , dyspnea or act tolerance.  Denies sore throat , rash , chest pain or orthopnea   [place] to family       Review of Systems  See HPI     Objective:   Physical Exam  GEN: A/Ox3; pleasant , NAD,    HEENT:  No oral candidiasis     RESP  Decreased BS in bases   CARD:  RRR, no m/r/g  ,  EXT no edema

## 2015-10-01 NOTE — Progress Notes (Signed)
Spirometry pre and post done today. 

## 2015-10-01 NOTE — Progress Notes (Signed)
[id number]: GSK IMPACT research study: blinded >/= 52 week randomized trial of ANORO equivalent v BREO equivalent v GSK triple MDI (ICS + LABA + LAMA). Sponsored by [place]   This visit 10/01/2015 for Erin Li with [birth year] who is subject number [id number] is a research Visit and is for purpose of research treatment and is number 6 on PROTOCOL.  Subject comes in with no complaints and informs me, she is moving to [facility] with the next few months.  She is going to finish out the study as planned.  We are having a problem with the [facility] system and allocating her medication, awaiting call from monitor but may need her to finish the inhaler she presently has.

## 2015-10-06 ENCOUNTER — Encounter: Payer: Medicare Other | Admitting: Adult Health

## 2015-10-06 NOTE — Progress Notes (Signed)
Reviewed sub-I notes. Agree with plan  Dr. Brand Males, M.D., [facility].C.P Pulmonary and Critical Care Medicine Staff Physician [place] [facility] Pager: [phone], If no answer or between  15:00h - 7:00h: call [phone]  10/06/2015 1:58 AM

## 2015-10-12 DIAGNOSIS — L280 Lichen simplex chronicus: Secondary | ICD-10-CM | POA: Diagnosis not present

## 2015-10-12 DIAGNOSIS — L301 Dyshidrosis [pompholyx]: Secondary | ICD-10-CM | POA: Diagnosis not present

## 2015-10-12 DIAGNOSIS — D485 Neoplasm of uncertain behavior of skin: Secondary | ICD-10-CM | POA: Diagnosis not present

## 2015-10-12 DIAGNOSIS — L920 Granuloma annulare: Secondary | ICD-10-CM | POA: Diagnosis not present

## 2015-10-15 ENCOUNTER — Telehealth: Payer: Self-pay | Admitting: Adult Health

## 2015-10-15 MED ORDER — PREDNISONE 10 MG PO TABS: 10.0000 mg | ORAL_TABLET | Freq: Every day | ORAL | Status: DC

## 2015-10-15 MED ORDER — CEPHALEXIN 500 MG PO CAPS: 500.0000 mg | ORAL_CAPSULE | Freq: Three times a day (TID) | ORAL | Status: DC

## 2015-10-15 NOTE — Telephone Encounter (Signed)
Rx sent to pharmacy. Patient aware. Nothing further needed.  

## 2015-10-15 NOTE — Telephone Encounter (Signed)
Called spoke with pt. C/o nasal cong, PND, prod cough (yellow), feels lousy. Denies any wheezing and no chest tx. Wants something called in. Please advise MR thanks

## 2015-10-15 NOTE — Telephone Encounter (Signed)
.  cephalexin 500mg  tid x 5 days Can do  Please take prednisone 40 mg x1 day, then 30 mg x1 day, then 20 mg x1 day, then 10 mg x1 day, and then 5 mg x1 day and stop  If antibiotic not working well enough

## 2015-10-15 NOTE — Telephone Encounter (Signed)
lmomtcb x1 

## 2015-11-09 DIAGNOSIS — L281 Prurigo nodularis: Secondary | ICD-10-CM | POA: Diagnosis not present

## 2015-11-09 DIAGNOSIS — L920 Granuloma annulare: Secondary | ICD-10-CM | POA: Diagnosis not present

## 2015-11-09 DIAGNOSIS — L308 Other specified dermatitis: Secondary | ICD-10-CM | POA: Diagnosis not present

## 2015-12-02 ENCOUNTER — Telehealth: Payer: Self-pay | Admitting: Adult Health

## 2015-12-02 MED ORDER — CEPHALEXIN 500 MG PO CAPS: 500.0000 mg | ORAL_CAPSULE | Freq: Three times a day (TID) | ORAL | Status: DC

## 2015-12-02 MED ORDER — OSELTAMIVIR PHOSPHATE 75 MG PO CAPS: 75.0000 mg | ORAL_CAPSULE | Freq: Two times a day (BID) | ORAL | Status: DC

## 2015-12-02 NOTE — Telephone Encounter (Signed)
Called and spoke with pt. Pt c/o flu like symptoms starting on 12/01/15. She c/o chest congestion/tightness, prod cough with yellow mucus, sinus drainage/pressure. Denies any fever, wheezing, SOB, nausea or vomiting. She states she is currently taking Vick's cold and flu and it does not seem to be helping. I offered an ov with MR today at 2:30 but pt refused stating she was to weak and already had an ov for 12/07/15. I explained to her that I would send a message to MR and would return call once we receive his recs. Pt request call back at [phone]. She voiced understanding and had no further questions.  MR please advise

## 2015-12-02 NOTE — Telephone Encounter (Signed)
Called spoke with pt. Aware of recs below. RX's sent in.  Pt will call back for prednisone if she does not improve Nothing further needed

## 2015-12-02 NOTE — Telephone Encounter (Signed)
tamiflu 75mg  bid x 5 days  CEphalexin 500mg  tid x 5 days  If not getting better with above Please take prednisone 40 mg x1 day, then 30 mg x1 day, then 20 mg x1 day, then 10 mg x1 day, and then 5 mg x1 day and stop  If still not better or getting worse fast  - ER   No Known Allergies

## 2015-12-06 ENCOUNTER — Other Ambulatory Visit: Payer: Self-pay | Admitting: Internal Medicine

## 2015-12-06 DIAGNOSIS — R0600 Dyspnea, unspecified: Secondary | ICD-10-CM

## 2015-12-07 ENCOUNTER — Ambulatory Visit (INDEPENDENT_AMBULATORY_CARE_PROVIDER_SITE_OTHER): Payer: Medicare Other | Admitting: Internal Medicine

## 2015-12-07 ENCOUNTER — Other Ambulatory Visit: Payer: Self-pay | Admitting: Internal Medicine

## 2015-12-07 DIAGNOSIS — R0600 Dyspnea, unspecified: Secondary | ICD-10-CM

## 2015-12-07 DIAGNOSIS — J449 Chronic obstructive pulmonary disease, unspecified: Secondary | ICD-10-CM

## 2015-12-07 DIAGNOSIS — Z006 Encounter for examination for normal comparison and control in clinical research program: Secondary | ICD-10-CM

## 2015-12-07 LAB — PULMONARY FUNCTION TEST
FEF 25-75 POST: 0.82 L/s
FEF 25-75 Pre: 0.84 L/sec
FEF2575-%Change-Post: -2 %
FEF2575-%Pred-Post: 46 %
FEF2575-%Pred-Pre: 48 %
FEV1-%CHANGE-POST: 0 %
FEV1-%Pred-Post: 71 %
FEV1-%Pred-Pre: 71 %
FEV1-POST: 1.55 L
FEV1-Pre: 1.54 L
FEV1FVC-%Change-Post: 2 %
FEV1FVC-%PRED-PRE: 86 %
FEV6-%Change-Post: 0 %
FEV6-%PRED-POST: 83 %
FEV6-%Pred-Pre: 84 %
FEV6-PRE: 2.31 L
FEV6-Post: 2.29 L
FEV6FVC-%CHANGE-POST: 1 %
FEV6FVC-%PRED-PRE: 102 %
FEV6FVC-%Pred-Post: 103 %
FVC-%CHANGE-POST: -2 %
FVC-%PRED-POST: 80 %
FVC-%PRED-PRE: 82 %
FVC-POST: 2.33 L
FVC-PRE: 2.38 L
PRE FEV6/FVC RATIO: 97 %
Post FEV1/FVC ratio: 67 %
Post FEV6/FVC ratio: 98 %
Pre FEV1/FVC ratio: 65 %

## 2015-12-07 MED ORDER — FLUTICASONE FUROATE-VILANTEROL 100-25 MCG/INH IN AEPB: 1.0000 | INHALATION_SPRAY | Freq: Every day | RESPIRATORY_TRACT | Status: DC

## 2015-12-07 NOTE — Progress Notes (Signed)
[id number]: [employer] IMPACT research study: blinded >/= 52 week randomized trial of ANORO equivalent v BREO equivalent v GSK triple MDI (ICS + LABA + LAMA). Sponsored by [employer]   This visit 12/07/2015 for Erin Li with 18-Apr-1941 who is subject number [id number] is a research  Visit and is for purpose of End of Treatment  and is number 7 on PROTOCOL.  Subject returns to complete her final visit for study.  She does have an AE which has been active since [id number] and she is taking antibiotics for this.  Otherwise she is not having any other new problems or changes to her medication.

## 2015-12-07 NOTE — Progress Notes (Signed)
Subjective:     Patient ID: Erin Li, female   DOB: [birth year], 75 y.o.   MRN: [id number]  HPI  OV 12/07/2015  Chief Complaint  Patient presents with  . Research    visit 7 Impact    See CRC note for introduction  - She is on impact COPD study. This the end of research product study drug visit. Tomorrow she will start her previous maintenance inhaler which is [employer]. She's had 2 recent exacerbation  10/15/2015 and again 12/02/2015. Now she is feeling back to baseline. No other complaints. She will have a safety telephone call in 2 weeks.  She had EKG today that I reviewed that looks normal and clinically not significant  She also had spirometry today that still shows Gold stage II COPD but it has improved since the beginning of the study.  Review of Systems na    Objective:   Physical Exam  Physical exam is been documented in the source document    Assessment:       ICD-9-CM ICD-10-CM   1. Research study patient V70.7 Z00.6   2. Dyspnea 786.09 R06.00 Pulmonary function test  3. COPD, moderate ([place]) 496 J44.9        Plan:     End of treatment visit on [place] study Start breo 12/08/15 - nurse will call in script to [facility] Use albuterol as needed Safety call in 2 week  followup  - standard of care routine followup for copd with Dr Chase Caller in 6-8 weeks - call or return earlier if problems   Dr. Brand Males, M.D., [facility].C.P Principal investigator Pulmonary and Critical Care Medicine Staff Physician [place] [facility] Pager: [phone], If no answer or between  15:00h - 7:00h: call [phone]  12/07/2015 1:44 PM

## 2015-12-07 NOTE — Progress Notes (Signed)
Spirometry pre and post done today. 

## 2015-12-07 NOTE — Patient Instructions (Addendum)
ICD-9-CM ICD-10-CM   1. Research study patient V70.7 Z00.6   2. Dyspnea 786.09 R06.00 Pulmonary function test  3. 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

## 2016-01-05 ENCOUNTER — Encounter: Payer: Medicare Other | Admitting: Adult Health

## 2016-03-09 DIAGNOSIS — I10 Essential (primary) hypertension: Secondary | ICD-10-CM | POA: Diagnosis not present

## 2016-03-09 DIAGNOSIS — E559 Vitamin D deficiency, unspecified: Secondary | ICD-10-CM | POA: Diagnosis not present

## 2016-03-09 DIAGNOSIS — Z780 Asymptomatic menopausal state: Secondary | ICD-10-CM | POA: Diagnosis not present

## 2016-03-09 DIAGNOSIS — E2839 Other primary ovarian failure: Secondary | ICD-10-CM | POA: Diagnosis not present

## 2016-03-17 DIAGNOSIS — J449 Chronic obstructive pulmonary disease, unspecified: Secondary | ICD-10-CM | POA: Diagnosis not present

## 2016-03-17 DIAGNOSIS — I10 Essential (primary) hypertension: Secondary | ICD-10-CM | POA: Diagnosis not present

## 2016-03-17 DIAGNOSIS — D7589 Other specified diseases of blood and blood-forming organs: Secondary | ICD-10-CM | POA: Diagnosis not present

## 2016-03-17 DIAGNOSIS — M810 Age-related osteoporosis without current pathological fracture: Secondary | ICD-10-CM | POA: Diagnosis not present

## 2016-04-24 ENCOUNTER — Telehealth: Payer: Self-pay | Admitting: Internal Medicine

## 2016-04-24 ENCOUNTER — Ambulatory Visit (INDEPENDENT_AMBULATORY_CARE_PROVIDER_SITE_OTHER): Payer: Medicare Other | Admitting: Internal Medicine

## 2016-04-24 ENCOUNTER — Encounter: Payer: Self-pay | Admitting: Internal Medicine

## 2016-04-24 VITALS — BP 110/58 | HR 58 | Ht 66.0 in | Wt 148.0 lb

## 2016-04-24 DIAGNOSIS — J449 Chronic obstructive pulmonary disease, unspecified: Secondary | ICD-10-CM

## 2016-04-24 MED ORDER — UMECLIDINIUM-VILANTEROL 62.5-25 MCG/INH IN AEPB: 1.0000 | INHALATION_SPRAY | Freq: Every day | RESPIRATORY_TRACT | 5 refills | Status: DC

## 2016-04-24 NOTE — Telephone Encounter (Signed)
So is she going back to [facility]?  Thanks  Dr. Brand Males, M.D., [facility].C.P Pulmonary and Critical Care Medicine Staff Physician [place] [facility] Pager: [phone], If no answer or between  15:00h - 7:00h: call [phone]  04/24/2016 5:08 PM

## 2016-04-24 NOTE — Patient Instructions (Signed)
ICD-9-CM ICD-10-CM   1. 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 Instead start stiolto daily or anoro daily (depending on insurance); take samples Flu shot in fall Will refer to consideration of future research copd study  folllowup 3 months or sooner; will address lung cancer screening at followup

## 2016-04-24 NOTE — Progress Notes (Signed)
Subjective:     Patient ID: Erin Li, female   DOB: 1941/04/11, 75 y.o.   MRN: [id number]  HPI   OV 04/24/2016  Chief Complaint  Patient presents with  . Follow-up    Pt states her SOB is at her baseline. Pt c/o increase in mucus when coughing but is unable to bring up the mucus. Pt denies CP/tightness and f/c/s.     Follow-up moderate COPD. Spirometry showed Gold stage II COPD 1.55 L/71% with a ratio 67 without any bronchodilator response = this was April 2017  Patient says overall COPD stable. The main issues that she's having significant amount of mucus more than she would like. It is mild to moderate in intensity and white in color. It is stable for the last 1 year. There is no change in this. Nevertheless she likes to see. Does not much of dyspnea or cough or wheeze or hemoptysis or weight loss. Last CT chest April 2016 with stable nodules for 2 years.     has a past medical history of Coronary artery disease; Diverticulosis; HTN (hypertension); and Lung nodule.   reports that she quit smoking about 4 years ago. She smoked 0.50 packs per day. She does not have any smokeless tobacco history on file.  Past Surgical History:  Procedure Laterality Date  . CARDIAC CATHETERIZATION    . CORONARY ANGIOPLASTY    . TONSILLECTOMY AND ADENOIDECTOMY      No Known Allergies  Immunization History  Administered Date(s) Administered  . Influenza Split 06/04/2013, 05/05/2014  . Influenza Whole 05/28/2012  . Influenza,inj,Quad PF,36+ Mos 06/05/2015  . Pneumococcal Conjugate-13 01/26/2015  . Pneumococcal Polysaccharide-23 09/04/2005, 09/24/2012    Family History  Problem Relation Age of Onset  . Heart attack Father   . Kidney disease Father   . COPD Mother   . Lung cancer Brother 39  . Pulmonary fibrosis Son     had lung transplant     Current Outpatient Prescriptions:  .  albuterol (PROVENTIL HFA;VENTOLIN HFA) 108 (90 BASE) MCG/ACT inhaler, Inhale 2 puffs into the lungs  every 6 (six) hours as needed., Disp: , Rfl:  .  Ascorbic Acid (VITAMIN C) 1000 MG tablet, Take 1,000 mg by mouth daily., Disp: , Rfl:  .  b complex vitamins tablet, Take 1 tablet by mouth daily., Disp: , Rfl:  .  calcium-vitamin D 250-100 MG-UNIT per tablet, Take 1 tablet by mouth daily. , Disp: , Rfl:  .  diltiazem (CARDIZEM) 60 MG tablet, Take 60 mg by mouth 3 (three) times daily., Disp: , Rfl:  .  fish oil-omega-3 fatty acids 1000 MG capsule, Take 2 g by mouth daily., Disp: , Rfl:  .  fluticasone furoate-vilanterol (BREO ELLIPTA) 100-25 MCG/INH AEPB, Inhale 1 puff into the lungs daily., Disp: 90 each, Rfl: 3 .  glucosamine-chondroitin 500-400 MG tablet, Take 1 tablet by mouth every morning., Disp: , Rfl:  .  magnesium 30 MG tablet, Take 30 mg by mouth 2 (two) times daily. 1 tabs daily (dose unknown), Disp: , Rfl:  .  metoprolol succinate (TOPROL-XL) 50 MG 24 hr tablet, Take 50 mg by mouth daily. Take with or immediately following a meal., Disp: , Rfl:  .  selenium 50 MCG TABS, Take 50 mcg by mouth daily., Disp: , Rfl:  .  senna (SENOKOT) 8.6 MG tablet, Take 1 tablet by mouth daily., Disp: , Rfl:  .  vitamin B-12 (CYANOCOBALAMIN) 1000 MCG tablet, Take 1,000 mcg by mouth daily., Disp: , Rfl:  .  vitamin E 200 UNIT capsule, Take 200 Units by mouth daily., Disp: , Rfl:    Review of Systems     Objective:   Physical Exam Vitals:   04/24/16 1105  BP: (!) 110/58  Pulse: (!) 58  SpO2: 97%  Weight: 148 lb (67.1 kg)  Height: 5\' 6"  (1.676 m)        Assessment:       ICD-9-CM ICD-10-CM   1. COPD, moderate ([place]) 496 J44.9        Plan:      Stable disease Because of increased mucus - stop breo. Instead start stiolto daily or anoro daily (depending on insurance); take samples Flu shot in fall Will refer to consideration of future research copd study  folllowup 3 months or sooner; will address lung cancer screening at followup     Dr. Brand Males, M.D.,  [facility].C.P Pulmonary and Critical Care Medicine Staff Physician [place] [facility] Pager: [phone], If no answer or between  15:00h - 7:00h: call [phone]  04/24/2016 11:36 AM

## 2016-04-24 NOTE — Telephone Encounter (Signed)
Per today's visit: Stable disease Because of increased mucus - stop breo. Instead start stiolto daily or anoro daily (depending on insurance); take samples Flu shot in fall Will refer to consideration of future research copd study   folllowup 3 months or sooner; will address lung cancer screening at followup --  Called spoke with pt. She reports her copay on anoro is $875. She was not given samples. Pt reports she is just going to continue on the breo. Please advise MR thanks

## 2016-04-24 NOTE — Telephone Encounter (Signed)
Pt calling stating that the issue w/med is fine now, said to disreguard 1 message.Erin Li e

## 2016-04-24 NOTE — Telephone Encounter (Signed)
lmtcb

## 2016-04-25 NOTE — Telephone Encounter (Signed)
Called and spoke with pts husband and he stated that everything is straightened out with her insurance and they are mailing her the stiolto.  Nothing further is needed.

## 2016-05-01 DIAGNOSIS — Z23 Encounter for immunization: Secondary | ICD-10-CM | POA: Diagnosis not present

## 2016-05-05 ENCOUNTER — Other Ambulatory Visit: Payer: Self-pay

## 2016-05-25 DIAGNOSIS — Z961 Presence of intraocular lens: Secondary | ICD-10-CM | POA: Diagnosis not present

## 2016-05-25 DIAGNOSIS — D3132 Benign neoplasm of left choroid: Secondary | ICD-10-CM | POA: Diagnosis not present

## 2016-05-25 DIAGNOSIS — G43109 Migraine with aura, not intractable, without status migrainosus: Secondary | ICD-10-CM | POA: Diagnosis not present

## 2016-05-25 DIAGNOSIS — H353131 Nonexudative age-related macular degeneration, bilateral, early dry stage: Secondary | ICD-10-CM | POA: Diagnosis not present

## 2016-05-25 DIAGNOSIS — H35373 Puckering of macula, bilateral: Secondary | ICD-10-CM | POA: Diagnosis not present

## 2016-06-12 DIAGNOSIS — D7589 Other specified diseases of blood and blood-forming organs: Secondary | ICD-10-CM | POA: Diagnosis not present

## 2016-06-12 DIAGNOSIS — I10 Essential (primary) hypertension: Secondary | ICD-10-CM | POA: Diagnosis not present

## 2016-06-15 DIAGNOSIS — M810 Age-related osteoporosis without current pathological fracture: Secondary | ICD-10-CM | POA: Diagnosis not present

## 2016-06-15 DIAGNOSIS — F419 Anxiety disorder, unspecified: Secondary | ICD-10-CM | POA: Diagnosis not present

## 2016-06-15 DIAGNOSIS — Z0000 Encounter for general adult medical examination without abnormal findings: Secondary | ICD-10-CM | POA: Diagnosis not present

## 2016-06-15 DIAGNOSIS — Z23 Encounter for immunization: Secondary | ICD-10-CM | POA: Diagnosis not present

## 2016-06-15 DIAGNOSIS — I10 Essential (primary) hypertension: Secondary | ICD-10-CM | POA: Diagnosis not present

## 2016-06-15 DIAGNOSIS — J449 Chronic obstructive pulmonary disease, unspecified: Secondary | ICD-10-CM | POA: Diagnosis not present

## 2016-07-12 ENCOUNTER — Telehealth: Payer: Self-pay | Admitting: Internal Medicine

## 2016-07-12 ENCOUNTER — Ambulatory Visit (INDEPENDENT_AMBULATORY_CARE_PROVIDER_SITE_OTHER): Payer: Medicare Other | Admitting: Internal Medicine

## 2016-07-12 ENCOUNTER — Encounter (INDEPENDENT_AMBULATORY_CARE_PROVIDER_SITE_OTHER): Payer: Medicare Other | Admitting: *Deleted

## 2016-07-12 ENCOUNTER — Encounter: Payer: Self-pay | Admitting: Internal Medicine

## 2016-07-12 VITALS — BP 122/62 | HR 57 | Ht 65.0 in | Wt 149.2 lb

## 2016-07-12 VITALS — BP 104/70 | HR 57 | Temp 97.6°F | Resp 16

## 2016-07-12 DIAGNOSIS — J449 Chronic obstructive pulmonary disease, unspecified: Secondary | ICD-10-CM | POA: Diagnosis not present

## 2016-07-12 DIAGNOSIS — Z006 Encounter for examination for normal comparison and control in clinical research program: Secondary | ICD-10-CM

## 2016-07-12 MED ORDER — CEPHALEXIN 500 MG PO CAPS: 500.0000 mg | ORAL_CAPSULE | Freq: Three times a day (TID) | ORAL | 0 refills | Status: DC

## 2016-07-12 MED ORDER — PREDNISONE 10 MG PO TABS: ORAL_TABLET | ORAL | 0 refills | Status: DC

## 2016-07-12 NOTE — Telephone Encounter (Signed)
Erin Li  Could you please look at her [name] 11/19/1940 with [place] [address] for lung cancer screening please   reports that she quit smoking about 4 years ago. She has a 37.50 pack-year smoking history. She does not have any smokeless tobacco history on file.   THanks   Dr. Brand Males, M.D., [facility].C.P Pulmonary and Critical Care Medicine Staff Physician [place] [facility] Pager: [phone], If no answer or between  15:00h - 7:00h: call [phone]  07/12/2016 7:08 PM

## 2016-07-12 NOTE — Progress Notes (Signed)
Title: Randomised, Double-Blind (Sponsor Open), Placebo-Controlled, Multicentre, Dose Ranging Study to Evaluate the Efficacy and Safety of Danirixin Tablets Administered Twice Daily Compared With Placebo for 24 Weeks in Adults With COPD  Sponsor: [employer] , Protoocol Number: [id number], NCT Trial #: NP:7000300  Synopsis:  Following baseline assessments collected over a 7 day period participants will be randomized (1:1:1:1:1:1) to receive one of five dose strengths of danirixin (5 milligram [mg], 10 mg, 25 mg, 35 mg and 50 mg) or placebo. Study treatment will be administered orally twice daily for 24 weeks. Participants will continue with their standard of care inhaled medications during study. Follow up will continue up to 28 days post last dose  Key Inclusion Criteria: age 21-80, COPD with fev1 >/= 40%, ratio <0.7, >/= 2 copd exacerbations in past 1 year treated with either antibiotics or steroids, but >/= 14 days since last antibiotic or steroid for exacerbation, >/= 10 pack smoking history  Key Exclusion Criteria: non-COPD lung disease, pulse ox < 88% at rest on room air (i.e, restting o2 patients excluded), active phase of pulmonary rehabilitation (maintenance ok), previous lung surgery, QTc prolongation, HIV positive etc..   Key end points: Changes in respiratory symptoms and copd exacerbations and safety of DNX  Key features of DNX:   selective CXC chemokine receptor (CXCR2) antagonist . Positive trends in COPD exacerbation, symptoms and health status  Safety of DNX: As of April 2017 (5 completed Phase 1 studies, and 1 Phase 2A study of moderate copd patients with 45 patients):[name] Most side effects are seen at 100mg  bid (current study max is 50mg  bid)  Phase 1 Normal Adults: for doses </= 50mg  bid   - Any event: 30% v 13% for placebo   - headache 10-20% v 0-17% for placebo   - Fatigue/Lethargy: 0-10% v 0-3% for placebo - Abd. Pain: 0-11% v 0% placebo (20% at 200mg  bid) - Dizziness:  0-11% v 0-3% for placebo (20% at 200mg  bid) - Cough 0% v 0% - Study withdrawal -  0% v 0% (4-20% at high dose)   - SAE - 0% v 0% - All side effects mild - moderate - No changes in labs, EKG and vitals including PMN per PI meeting slide - though in 1 study I came across transient decrease in PMN  3. COPD - data from   [place] meeting in spring 2017 and  IB April 2017 -  AECOPD and CAT scores better with drug after 1 year   - No change in PMN, EKG, Vitals, Labs, Spirometry   - AE - any event is similar for Placebo  V Drug  (52-77% v 55-80%)    - spl AE comments   - Cystitis 3% placebo v 5% IP 75mg  bid   - Back pain: 0% placebo v 5% IP 75mg  bid   - Nasopharyngitis: 33% placebo v 29% IP 75mg  bid   - Headache: 8% placebov v 9% IP 75mg  bid   - Abd Pain Ipper 6% placbo v 0% IP 75mg  bid  -    - SAE - More in placebo than drug (31 events v 10 events); when PI deemed 2 drug related it was placebo group!!   - No change in infection rate:  Placebo v Drug (50% v 42%) -  known to produce mild to moderate headache and diarrhea. No known influence on vital signs, ECG, spirometry, labs when compared to placebo. No evidence of increased infections or neutropenia ............................................................................................. ..........................................................  Patient Erin Li, DOB 08-Mar-1941, is  present today for pre-screening visit 0.  Patient has consented to participate, see [facility] informed consent process documentation checklist for full documentation of consent.    All required components of pre-screening Visit 0 were completed, including demographics, con med review and vital signs.  Patient will be contacted in the next few days to schedule visit 1.  Patient verbalized understanding.  Patient thanked for their participation and contribution to science.  Doreatha Martin, RN Research Nurse Office [phone]

## 2016-07-12 NOTE — Progress Notes (Signed)
Subjective:     Patient ID: Erin Li, female   DOB: 1941-01-03, 75 y.o.   MRN: [id number]  HPI OV 04/24/2016  Chief Complaint  Patient presents with  . Follow-up    Pt states her SOB is at her baseline. Pt c/o increase in mucus when coughing but is unable to bring up the mucus. Pt denies CP/tightness and f/c/s.     Follow-up moderate COPD. Spirometry showed Gold stage II COPD 1.55 L/71% with a ratio 67 without any bronchodilator response = this was April 2017  Patient says overall COPD stable. The main issues that she's having significant amount of mucus more than she would like. It is mild to moderate in intensity and white in color. It is stable for the last 1 year. There is no change in this. Nevertheless she likes to see. Does not much of dyspnea or cough or wheeze or hemoptysis or weight loss. Last CT chest April 2016 with stable nodules for 2 years.     has a past medical history of Coronary artery disease; Diverticulosis; HTN (hypertension); and Lung nodule.   reports that she quit smoking about 4 years ago. She smoked 0.50 packs per day. She does not have any smokeless tobacco history on file.    OV 07/12/2016  Chief Complaint  Patient presents with  . Follow-up    Pt states breathing is fine today. Pt c/o a mucus build up in throat, no tightness, or SOB. Coughing up a brown mucus occasionally. Discuss Breo instead of Anoro for cost.   Follow-up moderate COPD. Alpha 1 MM in 2014. Spirometry showed Gold stage II COPD 1.55 L/71% with a ratio 67 without any bronchodilator response = this was April 2017. Last CT chest April 2016 with stable nodules for 2 years. Llast flare ups 10/25/15 and 12/02/15 - treated over phone from office with documentation   Last visit Aug 2017. Sicne then COPD stable. Doing Anoro but has enteered donut hole. Wants to go back to [facility] because is cheaper. Feels BREO more effective anyways. HAs upcoming [place] 2 formal attire cruise with her  husband . Wants prednisone and abx to take with her in case of aecopd. S/p flu shot +. Symptoms stable with copd cat score of 8. Wants to participage in research studies. Seems eligible for lung cancer screening   CAT COPD Symptom & Quality of Life Score ([employer] trademark) 0 is no burden. 5 is highest burden 07/12/2016   Never Cough -> Cough all the time 2  No phlegm in chest -> Chest is full of phlegm 3  No chest tightness -> Chest feels very tight 0  No dyspnea for 1 flight stairs/hill -> Very dyspneic for 1 flight of stairs 3  No limitations for ADL at home -> Very limited with ADL at home 0  Confident leaving home -> Not at all confident leaving home 0  Sleep soundly -> Do not sleep soundly because of lung condition 0  Lots of Energy -> No energy at all 0  TOTAL Score (max 40)  8        has a past medical history of Coronary artery disease; Diverticulosis; HTN (hypertension); and Lung nodule.   reports that she quit smoking about 4 years ago. She has a 37.50 pack-year smoking history. She does not have any smokeless tobacco history on file.  Past Surgical History:  Procedure Laterality Date  . CARDIAC CATHETERIZATION    . CORONARY ANGIOPLASTY    . TONSILLECTOMY AND  ADENOIDECTOMY      No Known Allergies  Immunization History  Administered Date(s) Administered  . Influenza Split 06/04/2013, 05/05/2014  . Influenza Whole 05/28/2012  . Influenza, High Dose Seasonal PF 06/05/2016  . Influenza,inj,Quad PF,36+ Mos 06/05/2015  . Pneumococcal Conjugate-13 01/26/2015  . Pneumococcal Polysaccharide-23 09/04/2005, 09/24/2012    Family History  Problem Relation Age of Onset  . Heart attack Father   . Kidney disease Father   . COPD Mother   . Lung cancer Brother 35  . Pulmonary fibrosis Son     had lung transplant     Current Outpatient Prescriptions:  .  albuterol (PROVENTIL HFA;VENTOLIN HFA) 108 (90 BASE) MCG/ACT inhaler, Inhale 2 puffs into the lungs every 6 (six) hours as  needed., Disp: , Rfl:  .  Ascorbic Acid (VITAMIN C) 1000 MG tablet, Take 1,000 mg by mouth daily., Disp: , Rfl:  .  b complex vitamins tablet, Take 1 tablet by mouth daily., Disp: , Rfl:  .  calcium-vitamin D 250-100 MG-UNIT per tablet, Take 1 tablet by mouth daily. , Disp: , Rfl:  .  diltiazem (CARDIZEM) 60 MG tablet, Take 60 mg by mouth 3 (three) times daily., Disp: , Rfl:  .  fish oil-omega-3 fatty acids 1000 MG capsule, Take 2 g by mouth daily., Disp: , Rfl:  .  glucosamine-chondroitin 500-400 MG tablet, Take 1 tablet by mouth every morning., Disp: , Rfl:  .  magnesium 30 MG tablet, Take 30 mg by mouth 2 (two) times daily. 1 tabs daily (dose unknown), Disp: , Rfl:  .  metoprolol succinate (TOPROL-XL) 50 MG 24 hr tablet, Take 50 mg by mouth daily. Take with or immediately following a meal., Disp: , Rfl:  .  selenium 50 MCG TABS, Take 50 mcg by mouth daily., Disp: , Rfl:  .  senna (SENOKOT) 8.6 MG tablet, Take 1 tablet by mouth daily., Disp: , Rfl:  .  umeclidinium-vilanterol (ANORO ELLIPTA) 62.5-25 MCG/INH AEPB, Inhale 1 puff into the lungs daily., Disp: 3 each, Rfl: 5 .  vitamin B-12 (CYANOCOBALAMIN) 1000 MCG tablet, Take 1,000 mcg by mouth daily., Disp: , Rfl:  .  vitamin E 200 UNIT capsule, Take 200 Units by mouth daily., Disp: , Rfl:  .  fluticasone furoate-vilanterol (BREO ELLIPTA) 100-25 MCG/INH AEPB, Inhale 1 puff into the lungs daily. (Patient not taking: Reported on 07/12/2016), Disp: 90 each, Rfl: 3     Review of Systems     Objective:   Physical Exam  Constitutional: She is oriented to person, place, and time. She appears well-developed and well-nourished. No distress.  HENT:  Head: Normocephalic and atraumatic.  Right Ear: External ear normal.  Left Ear: External ear normal.  Mouth/Throat: Oropharynx is clear and moist. No oropharyngeal exudate.  Eyes: Conjunctivae and EOM are normal. Pupils are equal, round, and reactive to light. Right eye exhibits no discharge. Left eye  exhibits no discharge. No scleral icterus.  Neck: Normal range of motion. Neck supple. No JVD present. No tracheal deviation present. No thyromegaly present.  Cardiovascular: Normal rate, regular rhythm, normal heart sounds and intact distal pulses.  Exam reveals no gallop and no friction rub.   No murmur heard. Pulmonary/Chest: Effort normal and breath sounds normal. No respiratory distress. She has no wheezes. She has no rales. She exhibits no tenderness.  ? crackles  Abdominal: Soft. Bowel sounds are normal. She exhibits no distension and no mass. There is no tenderness. There is no rebound and no guarding.  Musculoskeletal: Normal range  of motion. She exhibits no edema or tenderness.  Lymphadenopathy:    She has no cervical adenopathy.  Neurological: She is alert and oriented to person, place, and time. She has normal reflexes. No cranial nerve deficit. She exhibits normal muscle tone. Coordination normal.  Skin: Skin is warm and dry. No rash noted. She is not diaphoretic. No erythema. No pallor.  Psychiatric: She has a normal mood and affect. Her behavior is normal. Judgment and thought content normal.  Vitals reviewed.  Vitals:   07/12/16 1047  BP: 122/62  Pulse: (!) 57  SpO2: 97%  Weight: 149 lb 3.2 oz (67.7 kg)  Height: 5\' 5"  (1.651 m)   Estimated body mass index is 24.83 kg/m as calculated from the following:   Height as of this encounter: 5\' 5"  (1.651 m).   Weight as of this encounter: 149 lb 3.2 oz (67.7 kg).      Assessment:       ICD-9-CM ICD-10-CM   1. COPD, moderate ([place]) 496 J44.9        Plan:      Stable disease (last flare ups 10/25/15 and 12/02/15) Change back to breo - take samples, previous dosing Use albuterol as needed Glad uptodate with Flu shot  REfer  research copd study - danirixin COPD study - meet with Carroll Kinds Will refer to Eric Form for lung cancer screening - will send her a message For upcoming [place] [place] 2 cruise 10/03/15 and use it  if in flare up  - take cephalexin 500mg  tid x 5 days   - Please take prednisone 40 mg x1 day, then 30 mg x1 day, then 20 mg x1 day, then 10 mg x1 day, and then 5 mg x1 day and stop   folllowup 3 months or sooner    Dr. Brand Males, M.D., [facility].C.P Pulmonary and Critical Care Medicine Staff Physician [place] [facility] Pager: [phone], If no answer or between  15:00h - 7:00h: call [phone]  07/12/2016 11:28 AM

## 2016-07-12 NOTE — Telephone Encounter (Signed)
RESEARCH NOTE 07/12/2016 for COPD IMPACT study    - Toula Charbonnet [birth year]  was on the following study.   [id number]: [employer] IMPACT research study: blinded >/= 52 week randomized trial of ANORO equivalent v BREO equivalent v GSK triple MDI (ICS + LABA + LAMA). Sponsored by [employer]  She was subject nunber ubject number [id number]. End of Treatment was 12/07/15.   - she is no longer on the study. She's finished end of treatment.  She is now on standard of care therapy for many months. A few months ago during close out visit and monitoring evaluation it was discovered that subject during the course of her study had electrocardiogram (EKG) done at incorrect time points. This contributing to potential safety monitoring protocol deviation. I discussed this with Hend Lueker and disclosed this to Darika Stubenrauch on the standard of care visit it  04/24/16 and again 07/12/2016 . The potential safety impact of this protocol deviation and safety of medication is very minimal based on approval time of IP that were already in market and other LABA, LAMA, ICS in market.  - She verbalized understanding and had no further questions. She appreciated the disclosure    Dr. Brand Males, M.D., [facility].C.P Pulmonary and Critical Care Medicine Staff Physician [place] [facility] Pager: [phone], If no answer or between  15:00h - 7:00h: call [phone]  07/12/2016 7:28 PM

## 2016-07-12 NOTE — Patient Instructions (Addendum)
ICD-9-CM ICD-10-CM   1. 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

## 2016-07-17 ENCOUNTER — Ambulatory Visit (INDEPENDENT_AMBULATORY_CARE_PROVIDER_SITE_OTHER)
Admission: RE | Admit: 2016-07-17 | Discharge: 2016-07-17 | Disposition: A | Discharge status: Home / Self Care | Payer: Medicare Other | Source: Ambulatory Visit | Attending: Adult Health | Admitting: Adult Health

## 2016-07-17 ENCOUNTER — Ambulatory Visit (INDEPENDENT_AMBULATORY_CARE_PROVIDER_SITE_OTHER): Payer: Medicare Other | Admitting: Adult Health

## 2016-07-17 ENCOUNTER — Encounter (INDEPENDENT_AMBULATORY_CARE_PROVIDER_SITE_OTHER): Payer: Medicare Other | Admitting: *Deleted

## 2016-07-17 ENCOUNTER — Encounter: Payer: Self-pay | Admitting: *Deleted

## 2016-07-17 ENCOUNTER — Encounter: Payer: Self-pay | Admitting: Adult Health

## 2016-07-17 VITALS — BP 120/80 | HR 74 | Temp 97.6°F | Resp 12 | Ht 65.0 in | Wt 147.0 lb

## 2016-07-17 VITALS — BP 118/80 | HR 67 | Temp 97.6°F | Resp 12 | Ht 65.0 in | Wt 147.0 lb

## 2016-07-17 DIAGNOSIS — Z006 Encounter for examination for normal comparison and control in clinical research program: Secondary | ICD-10-CM

## 2016-07-17 DIAGNOSIS — J449 Chronic obstructive pulmonary disease, unspecified: Secondary | ICD-10-CM

## 2016-07-17 NOTE — Progress Notes (Signed)
Subjective:    Patient ID: Erin Li, female    DOB: 02-03-1941, 75 y.o.   MRN: [id number]  HPI 75 yo female with moderate COPD   TEST  12/2015 Spirometry showed Gold stage II COPD 1.55 L/71% with a ratio 67 without any bronchodilator response   07/17/2016 Research Visit : Screening Visit #1   Title: Randomised, Double-Blind (Sponsor Open), Placebo-Controlled, Multicentre, Dose Ranging Study to Evaluate the Efficacy and Safety of Danirixin Tablets Administered Twice Daily Compared With Placebo for 24 Weeks in Adults With COPD  Sponsor: [employer] , Protoocol Number: [id number], NCT Trial #: [id number]  Synopsis:  Following baseline assessments collected over a 7 day period participants will be randomized (1:1:1:1:1:1) to receive one of five dose strengths of danirixin (5 milligram [mg], 10 mg, 25 mg, 35 mg and 50 mg) or placebo. Study treatment will be administered orally twice daily for 24 weeks. Participants will continue with their standard of care inhaled medications during study. Follow up will continue up to 28 days post last dose   Key Inclusion Criteria: age 32-80, COPD with fev1 >/= 40%, ratio <0.7, >/= 2 copd exacerbations in past 1 year treated with either antibiotics or steroids, but >/= 14 days since last antibiotic or steroid for exacerbation, >/= 10 pack smoking history  Key Exclusion Criteria: non-COPD lung disease, pulse ox < 88% at rest on room air (i.e, restting o2 patients excluded), active phase of pulmonary rehabilitation (maintenance ok), previous lung surgery, QTc prolongation, HIV positive etc..   Key end points: Changes in respiratory symptoms and copd exacerbations and safety of DNX  Key features of DNX:   selective CXC chemokine receptor (CXCR2) antagonist . Positive trends in COPD exacerbation, symptoms and health status  Safety of DNX: As of April 2017 (5 completed Phase 1 studies, and 1 Phase 2A study of moderate copd patients with 45  patients):[name] Most side effects are seen at 100mg  bid (current study max is 50mg  bid)  Phase 1 Normal Adults: for doses </= 50mg  bid  - Any event: 30% v 13% for placebo  - headache 10-20% v 0-17% for placebo  - Fatigue/Lethargy: 0-10% v 0-3% for placebo - Abd. Pain: 0-11% v 0% placebo (20% at 200mg  bid) - Dizziness: 0-11% v 0-3% for placebo (20% at 200mg  bid) - Cough 0% v 0% - Study withdrawal -  0% v 0% (4-20% at high dose)  - SAE - 0% v 0% - All side effects mild - moderate - No changes in labs, EKG and vitals including PMN per PI meeting slide - though in 1 study I came across transient decrease in PMN  3.COPD - data from  [place] meeting in spring 2017 and  IB April 2017 -  AECOPD and CAT scores better with drug after 1 year  - No change in PMN, EKG, Vitals, Labs, Spirometry  - AE - any event is similar for Placebo  V Drug  (52-77% v 55-80%)              - spl AE comments                         - Cystitis 3% placebo v 5% IP 75mg  bid                         - Back pain: 0% placebo v 5% IP 75mg  bid                         -  Nasopharyngitis: 33% placebo v 29% IP 75mg  bid                         - Headache: 8% placebov v 9% IP 75mg  bid                         - Abd Pain Ipper 6% placbo v 0% IP 75mg  bid                      -                        - SAE - More in placebo than drug (31 events v 10 events); when PI deemed 2 drug related it was placebo group!!  - No change in infection rate:  Placebo v Drug (50% v 42%) -  known to produce mild to moderate headache and diarrhea. No known influence on vital signs, ECG, spirometry, labs when compared to placebo. No evidence of increased infections or neutropenia ........................................................................................................................................................................  Subject is here for Screening Visit 1 for [place].   Pt is here for screening visit #1 for Danirixin.    She says she is doing well with COPD w/ no flare of cough or wheezing  Currently finishing ANORO and will going back to [facility] (like it better) .  Today research pre/post spirometry showed Fev1 60%, ratio 56 , FVC 80% , no BD response.  EKG shows Sinus Bradycardia , QT 438. (pt is on cardizem, and metoprolol ) . Previous EKG in past showed sinus bradycardia )  She denies chest pain , orthopnea, edema or hemoptysis .  Has finished pulmonary rehab while back. Not on O2 .  CXR today showed COPD changes .    Review of Systems Reviewed , see HPI     Objective:   Physical Exam Vitals:   07/17/16 1008  BP: 118/80  Pulse: 67  Resp: 12  Temp: 97.6 F (36.4 C)  TempSrc: Oral  SpO2: 94%  Weight: 147 lb (66.7 kg)  Height: 5\' 5"  (1.651 m)    GEN: A/Ox3; pleasant , NAD, well nourished    HEENT:  [place]/AT,  EACs-clear, TMs-wnl, NOSE-clear, THROAT-clear, no lesions, no postnasal drip or exudate noted.   NECK:  Supple w/ fair ROM; no JVD; normal carotid impulses w/o bruits; no thyromegaly or nodules palpated; no lymphadenopathy.    RESP  Clear  P & A; w/o, wheezes/ rales/ or rhonchi. no accessory muscle use, no dullness to percussion  CARD:  RRR, no m/r/g  , no peripheral edema, pulses intact, no cyanosis or clubbing.  GI:   Soft & nt; nml bowel sounds; no organomegaly or masses detected.   Musco: Warm bil, no deformities or joint swelling noted.   Neuro: alert, no focal deficits noted.    Skin: Warm, no lesions or rashes    Noami Bove NP-C  [facility]  07/17/2016

## 2016-07-17 NOTE — Assessment & Plan Note (Signed)
Appears stable , cont w/ current regimen and follow up with Dr. Chase Caller

## 2016-07-17 NOTE — Assessment & Plan Note (Signed)
Title: Randomised, Double-Blind (Sponsor Open), Placebo-Controlled, Multicentre, Dose Ranging Study to Evaluate the Efficacy and Safety of Danirixin Tablets Administered Twice Daily Compared With Placebo for 24 Weeks in Adults With COPD  Sponsor: [employer] , Protoocol Number: [id number], NCT Trial #: [id number]  Synopsis:  Following baseline assessments collected over a 7 day period participants will be randomized (1:1:1:1:1:1) to receive one of five dose strengths of danirixin (5 milligram [mg], 10 mg, 25 mg, 35 mg and 50 mg) or placebo. Study treatment will be administered orally twice daily for 24 weeks. Participants will continue with their standard of care inhaled medications during study. Follow up will continue up to 28 days post last dose  Screening visit #1 - Spirometry and EKG reviewed .  Exam done. Labs pending .  Cont per research protocol.

## 2016-07-17 NOTE — Progress Notes (Signed)
Title: Randomised, Double-Blind (Sponsor Open), Placebo-Controlled, Multicentre, Dose Ranging Study to Evaluate the Efficacy and Safety of Danirixin Tablets Administered Twice Daily Compared With Placebo for 24 Weeks in Adults With COPD  Sponsor: [employer] , Protoocol Number: [id number], NCT Trial #: [id number]  Synopsis:  Following baseline assessments collected over a 7 day period participants will be randomized (1:1:1:1:1:1) to receive one of five dose strengths of danirixin (5 milligram [mg], 10 mg, 25 mg, 35 mg and 50 mg) or placebo. Study treatment will be administered orally twice daily for 24 weeks. Participants will continue with their standard of care inhaled medications during study. Follow up will continue up to 28 days post last dose  Key Inclusion Criteria: age 74-74, COPD with fev1 >/= 40%, ratio <0.7, >/= 2 copd exacerbations in past 1 year treated with either antibiotics or steroids, but >/= 14 days since last antibiotic or steroid for exacerbation, >/= 10 pack smoking history  Key Exclusion Criteria: non-COPD lung disease, pulse ox < 88% at rest on room air (i.e, restting o2 patients excluded), active phase of pulmonary rehabilitation (maintenance ok), previous lung surgery, QTc prolongation, HIV positive etc..   Key end points: Changes in respiratory symptoms and copd exacerbations and safety of DNX  Key features of DNX:   selective CXC chemokine receptor (CXCR2) antagonist . Positive trends in COPD exacerbation, symptoms and health status  Safety of DNX: As of April 2017 (5 completed Phase 1 studies, and 1 Phase 2A study of moderate copd patients with 74 patients):[name] Most side effects are seen at 100mg  bid (current study max is 50mg  bid)  Phase 1 Normal Adults: for doses </= 50mg  bid   - Any event: 30% v 13% for placebo   - headache 10-20% v 0-17% for placebo   - Fatigue/Lethargy: 0-10% v 0-3% for placebo - Abd. Pain: 0-11% v 0% placebo (20% at 200mg  bid) - Dizziness:  0-11% v 0-3% for placebo (20% at 200mg  bid) - Cough 0% v 0% - Study withdrawal -  0% v 0% (4-20% at high dose)   - SAE - 0% v 0% - All side effects mild - moderate - No changes in labs, EKG and vitals including PMN per PI meeting slide - though in 1 study I came across transient decrease in PMN  3. COPD - data from   [place] meeting in spring 2017 and  IB April 2017 -  AECOPD and CAT scores better with drug after 1 year   - No change in PMN, EKG, Vitals, Labs, Spirometry   - AE - any event is similar for Placebo  V Drug  (52-77% v 55-80%)    - spl AE comments   - Cystitis 3% placebo v 5% IP 75mg  bid   - Back pain: 0% placebo v 5% IP 75mg  bid   - Nasopharyngitis: 33% placebo v 29% IP 75mg  bid   - Headache: 8% placebov v 9% IP 75mg  bid   - Abd Pain Ipper 6% placbo v 0% IP 75mg  bid  -    - SAE - More in placebo than drug (31 events v 10 events); when PI deemed 2 drug related it was placebo group!!   - No change in infection rate:  Placebo v Drug (50% v 42%) -  known to produce mild to moderate headache and diarrhea. No known influence on vital signs, ECG, spirometry, labs when compared to placebo. No evidence of increased infections or neutropenia .......................................................................................................................................................................[name]

## 2016-07-18 ENCOUNTER — Encounter (INDEPENDENT_AMBULATORY_CARE_PROVIDER_SITE_OTHER): Payer: Medicare Other | Admitting: *Deleted

## 2016-07-18 DIAGNOSIS — Z006 Encounter for examination for normal comparison and control in clinical research program: Secondary | ICD-10-CM

## 2016-07-18 DIAGNOSIS — J449 Chronic obstructive pulmonary disease, unspecified: Secondary | ICD-10-CM

## 2016-07-18 NOTE — Progress Notes (Signed)
Title: Randomised, Double-Blind (Sponsor Open), Placebo-Controlled, Multicentre, Dose Ranging Study to Evaluate the Efficacy and Safety of Danirixin Tablets Administered Twice Daily Compared With Placebo for 24 Weeks in Adults With COPD  Sponsor: [employer] , Protoocol Number: [id number], NCT Trial #: [id number]  Synopsis:  Following baseline assessments collected over a 7 day period participants will be randomized (1:1:1:1:1:1) to receive one of five dose strengths of danirixin (5 milligram [mg], 10 mg, 25 mg, 35 mg and 50 mg) or placebo. Study treatment will be administered orally twice daily for 24 weeks. Participants will continue with their standard of care inhaled medications during study. Follow up will continue up to 28 days post last dose  Key Inclusion Criteria: age 28-80, COPD with fev1 >/= 40%, ratio <0.7, >/= 2 copd exacerbations in past 1 year treated with either antibiotics or steroids, but >/= 14 days since last antibiotic or steroid for exacerbation, >/= 10 pack smoking history  Key Exclusion Criteria: non-COPD lung disease, pulse ox < 88% at rest on room air (i.e, restting o2 patients excluded), active phase of pulmonary rehabilitation (maintenance ok), previous lung surgery, QTc prolongation, HIV positive etc..   Key end points: Changes in respiratory symptoms and copd exacerbations and safety of DNX  Key features of DNX:   selective CXC chemokine receptor (CXCR2) antagonist . Positive trends in COPD exacerbation, symptoms and health status  Safety of DNX: As of April 2017 (5 completed Phase 1 studies, and 1 Phase 2A study of moderate copd patients with 45 patients):[name] Most side effects are seen at 100mg  bid (current study max is 50mg  bid)  Phase 1 Normal Adults: for doses </= 50mg  bid   - Any event: 30% v 13% for placebo   - headache 10-20% v 0-17% for placebo   - Fatigue/Lethargy: 0-10% v 0-3% for placebo - Abd. Pain: 0-11% v 0% placebo (20% at 200mg  bid) - Dizziness:  0-11% v 0-3% for placebo (20% at 200mg  bid) - Cough 0% v 0% - Study withdrawal -  0% v 0% (4-20% at high dose)   - SAE - 0% v 0% - All side effects mild - moderate - No changes in labs, EKG and vitals including PMN per PI meeting slide - though in 1 study I came across transient decrease in PMN  3. COPD - data from   [place] meeting in spring 2017 and  IB April 2017 -  AECOPD and CAT scores better with drug after 1 year   - No change in PMN, EKG, Vitals, Labs, Spirometry   - AE - any event is similar for Placebo  V Drug  (52-77% v 55-80%)    - spl AE comments   - Cystitis 3% placebo v 5% IP 75mg  bid   - Back pain: 0% placebo v 5% IP 75mg  bid   - Nasopharyngitis: 33% placebo v 29% IP 75mg  bid   - Headache: 8% placebov v 9% IP 75mg  bid   - Abd Pain Ipper 6% placbo v 0% IP 75mg  bid  -    - SAE - More in placebo than drug (31 events v 10 events); when PI deemed 2 drug related it was placebo group!!   - No change in infection rate:  Placebo v Drug (50% v 42%) -  known to produce mild to moderate headache and diarrhea. No known influence on vital signs, ECG, spirometry, labs when compared to placebo. No evidence of increased infections or neutropenia ........................................................................................................................................................................ Subject, Erin Li, DOB 23/APR/1942, is present today  14/NOV/2017 for Visit 2 on protocol. E-diary was dispensed and subject was trained on use. Propeller MDI sensor was also dispensed and subject was trained on the use. Subject did consent to participate in the Actigraph sub-study, however when the coordinator proceeded to assign the device to the subject the [place] prevented the device from being assigned. The coordinator has troubleshooted this issue prior to today's visit, however the software still did not work. The site will follow-up with IP department to solve the  software issue. Subject is aware and stated understanding.    [place], [place], Research Assistant

## 2016-07-18 NOTE — Progress Notes (Signed)
Title: Randomised, Double-Blind (Sponsor Open), Placebo-Controlled, Multicentre, Dose Ranging Study to Evaluate the Efficacy and Safety of Danirixin Tablets Administered Twice Daily Compared With Placebo for 24 Weeks in Adults With COPD  Sponsor: [employer] , Protoocol Number: [id number], NCT Trial #: [id number]  Synopsis:  Following baseline assessments collected over a 7 day period participants will be randomized (1:1:1:1:1:1) to receive one of five dose strengths of danirixin (5 milligram [mg], 10 mg, 25 mg, 35 mg and 50 mg) or placebo. Study treatment will be administered orally twice daily for 24 weeks. Participants will continue with their standard of care inhaled medications during study. Follow up will continue up to 28 days post last dose  Key Inclusion Criteria: age 79-80, COPD with fev1 >/= 40%, ratio <0.7, >/= 2 copd exacerbations in past 1 year treated with either antibiotics or steroids, but >/= 14 days since last antibiotic or steroid for exacerbation, >/= 10 pack smoking history  Key Exclusion Criteria: non-COPD lung disease, pulse ox < 88% at rest on room air (i.e, restting o2 patients excluded), active phase of pulmonary rehabilitation (maintenance ok), previous lung surgery, QTc prolongation, HIV positive etc..   Key end points: Changes in respiratory symptoms and copd exacerbations and safety of DNX  Key features of DNX:   selective CXC chemokine receptor (CXCR2) antagonist . Positive trends in COPD exacerbation, symptoms and health status  Safety of DNX: As of April 2017 (5 completed Phase 1 studies, and 1 Phase 2A study of moderate copd patients with 45 patients):[name] Most side effects are seen at 100mg  bid (current study max is 50mg  bid)  Phase 1 Normal Adults: for doses </= 50mg  bid   - Any event: 30% v 13% for placebo   - headache 10-20% v 0-17% for placebo   - Fatigue/Lethargy: 0-10% v 0-3% for placebo - Abd. Pain: 0-11% v 0% placebo (20% at 200mg  bid) - Dizziness:  0-11% v 0-3% for placebo (20% at 200mg  bid) - Cough 0% v 0% - Study withdrawal -  0% v 0% (4-20% at high dose)   - SAE - 0% v 0% - All side effects mild - moderate - No changes in labs, EKG and vitals including PMN per PI meeting slide - though in 1 study I came across transient decrease in PMN  3. COPD - data from   [place] meeting in spring 2017 and  IB April 2017 -  AECOPD and CAT scores better with drug after 1 year   - No change in PMN, EKG, Vitals, Labs, Spirometry   - AE - any event is similar for Placebo  V Drug  (52-77% v 55-80%)    - spl AE comments   - Cystitis 3% placebo v 5% IP 75mg  bid   - Back pain: 0% placebo v 5% IP 75mg  bid   - Nasopharyngitis: 33% placebo v 29% IP 75mg  bid   - Headache: 8% placebov v 9% IP 75mg  bid   - Abd Pain Ipper 6% placbo v 0% IP 75mg  bid  -    - SAE - More in placebo than drug (31 events v 10 events); when PI deemed 2 drug related it was placebo group!!   - No change in infection rate:  Placebo v Drug (50% v 42%) -  known to produce mild to moderate headache and diarrhea. No known influence on vital signs, ECG, spirometry, labs when compared to placebo. No evidence of increased infections or neutropenia ........................................................................................................................................................................  Patient Erin Li, DOB 23/APR/1942, is present  today 13/NOV/2017 for screening visit, visit 1 on protocol.  Patient reports she continues to have cough and increased clear mucus that gets stuck in her throat on a daily basis. This was discussed at her most recent [facility] visit with Dr. Chase Caller on 08/NOV/2017, no change in medications since pre-screening visit.  See Office visit encounter for EKG, spirometry.  Central labs (fasting) completed per protocol.  See paper research chart for completed record of medical history and inclusion/exclusion review.  [place] Bing, [place]  Assistant [phone]

## 2016-07-24 NOTE — Progress Notes (Signed)
PI OVERSIGHT NOTE  I the Principal Investigator (PI) for the above mentioned study attest that I reviewed the above mentioned  sub-investigator notes on research subject  Erin Li  born 1941-08-31 . I  agree with the findings mentioned above  Dr. Brand Males, M.D., [facility].C.P Pulmonary and Critical Care Medicine Staff Physician [place] [facility] Pager: [phone], If no answer or between  15:00h - 7:00h: call [phone]  07/24/2016 2:08 PM

## 2016-07-25 ENCOUNTER — Ambulatory Visit: Payer: Medicare Other | Admitting: Internal Medicine

## 2016-07-26 ENCOUNTER — Encounter: Payer: Medicare Other | Admitting: *Deleted

## 2016-07-26 ENCOUNTER — Ambulatory Visit (INDEPENDENT_AMBULATORY_CARE_PROVIDER_SITE_OTHER): Payer: Medicare Other | Admitting: Internal Medicine

## 2016-07-26 VITALS — BP 110/72 | HR 60 | Temp 97.9°F | Resp 10

## 2016-07-26 DIAGNOSIS — J449 Chronic obstructive pulmonary disease, unspecified: Secondary | ICD-10-CM

## 2016-07-26 DIAGNOSIS — Z006 Encounter for examination for normal comparison and control in clinical research program: Secondary | ICD-10-CM

## 2016-07-26 NOTE — Progress Notes (Signed)
Title: Randomised, Double-Blind (Sponsor Open), Placebo-Controlled, Multicentre, Dose Ranging Study to Evaluate the Efficacy and Safety of Danirixin Tablets Administered Twice Daily Compared With Placebo for 24 Weeks in Adults With COPD  Sponsor: [employer] , Protoocol Number: [id number], NCT Trial #: [id number]  Synopsis:  Following baseline assessments collected over a 7 day period participants will be randomized (1:1:1:1:1:1) to receive one of five dose strengths of danirixin (5 milligram [mg], 10 mg, 25 mg, 35 mg and 50 mg) or placebo. Study treatment will be administered orally twice daily for 24 weeks. Participants will continue with their standard of care inhaled medications during study. Follow up will continue up to 28 days post last dose  Key Inclusion Criteria: age 74-80, COPD with fev1 >/= 40%, ratio <0.7, >/= 2 copd exacerbations in past 1 year treated with either antibiotics or steroids, but >/= 14 days since last antibiotic or steroid for exacerbation, >/= 10 pack smoking history  Key Exclusion Criteria: non-COPD lung disease, pulse ox < 88% at rest on room air (i.e, restting o2 patients excluded), active phase of pulmonary rehabilitation (maintenance ok), previous lung surgery, QTc prolongation, HIV positive etc..   Key end points: Changes in respiratory symptoms and copd exacerbations and safety of DNX  Key features of DNX:   selective CXC chemokine receptor (CXCR2) antagonist . Positive trends in COPD exacerbation, symptoms and health status  Safety of DNX: As of April 2017 (5 completed Phase 1 studies, and 1 Phase 2A study of moderate copd patients with 45 patients):[name] Most side effects are seen at 100mg  bid (current study max is 50mg  bid)  Phase 1 Normal Adults: for doses </= 50mg  bid   - Any event: 30% v 13% for placebo   - headache 10-20% v 0-17% for placebo   - Fatigue/Lethargy: 0-10% v 0-3% for placebo - Abd. Pain: 0-11% v 0% placebo (20% at 200mg  bid) - Dizziness:  0-11% v 0-3% for placebo (20% at 200mg  bid) - Cough 0% v 0% - Study withdrawal -  0% v 0% (4-20% at high dose)   - SAE - 0% v 0% - All side effects mild - moderate - No changes in labs, EKG and vitals including PMN per PI meeting slide - though in 1 study I came across transient decrease in PMN  3. COPD - data from   [place] meeting in spring 2017 and  IB April 2017 -  AECOPD and CAT scores better with drug after 1 year   - No change in PMN, EKG, Vitals, Labs, Spirometry   - AE - any event is similar for Placebo  V Drug  (52-77% v 55-80%)    - spl AE comments   - Cystitis 3% placebo v 5% IP 75mg  bid   - Back pain: 0% placebo v 5% IP 75mg  bid   - Nasopharyngitis: 33% placebo v 29% IP 75mg  bid   - Headache: 8% placebov v 9% IP 75mg  bid   - Abd Pain Ipper 6% placbo v 0% IP 75mg  bid  -    - SAE - More in placebo than drug (31 events v 10 events); when PI deemed 2 drug related it was placebo group!!   - No change in infection rate:  Placebo v Drug (50% v 42%) -  known to produce mild to moderate headache and diarrhea. No known influence on vital signs, ECG, spirometry, labs when compared to placebo. No evidence of increased infections or neutropenia ........................................................................................................................................................................  Patient Erin Li, DOB 10-03-1940, is present  today for Research Visit for the purpose of Randomization, Visit 3 on protocol.  See also Anderson Malta Castillo's documentation of visit.   Randomization was confirmed with Dr. Chase Caller and patient was registered in [facility] by Nelson Bing.    At end of visit, this RN educated subject on how to self administer medication (BID dosing, take with morning or evening meal and follow dose with a full glass of water), continue to take all other medications as prescribed.  Subject to take only one dose today (due to first dose being taken in  afternoon - this plan was confirmed with study monitor Lori Repass), and begin BID dosing tomorrow, 23/Nov/17.  Subject verbalized understanding of how to administer medication.   Bottle assignment and dispensing was confirmed by both this RN and Calio Bing.  This RN witnessed subject take first dose of study medication, see paper source documentation for time of administration.  Subject remained with this RN and Phillips Bing for 30 minutes following administration of first dose of study medication and patient reports no adverse events.  Patient did not demonstrate any signs of an adverse event prior to leaving.    Subject was reminded to seek emergency care as needed.  Subject was provided with study contact information and subject verbalized understanding.

## 2016-07-26 NOTE — Progress Notes (Signed)
Title: Randomised, Double-Blind (Sponsor Open), Placebo-Controlled, Multicentre, Dose Ranging Study to Evaluate the Efficacy and Safety of Danirixin Tablets Administered Twice Daily Compared With Placebo for 24 Weeks in Adults With COPD  Sponsor: [employer] , Protoocol Number: [id number], NCT Trial #: [id number]  Synopsis:  Following baseline assessments collected over a 7 day period participants will be randomized (1:1:1:1:1:1) to receive one of five dose strengths of danirixin (5 milligram [mg], 10 mg, 25 mg, 35 mg and 50 mg) or placebo. Study treatment will be administered orally twice daily for 24 weeks. Participants will continue with their standard of care inhaled medications during study. Follow up will continue up to 28 days post last dose  Key Inclusion Criteria: age 66-80, COPD with fev1 >/= 40%, ratio <0.7, >/= 2 copd exacerbations in past 1 year treated with either antibiotics or steroids, but >/= 14 days since last antibiotic or steroid for exacerbation, >/= 10 pack smoking history  Key Exclusion Criteria: non-COPD lung disease, pulse ox < 88% at rest on room air (i.e, restting o2 patients excluded), active phase of pulmonary rehabilitation (maintenance ok), previous lung surgery, QTc prolongation, HIV positive etc..   Key end points: Changes in respiratory symptoms and copd exacerbations and safety of DNX  Key features of DNX:   selective CXC chemokine receptor (CXCR2) antagonist . Positive trends in COPD exacerbation, symptoms and health status  Safety of DNX: As of April 2017 (5 completed Phase 1 studies, and 1 Phase 2A study of moderate copd patients with 45 patients):[name] Most side effects are seen at 100mg  bid (current study max is 50mg  bid)  Phase 1 Normal Adults: for doses </= 50mg  bid   - Any event: 30% v 13% for placebo   - headache 10-20% v 0-17% for placebo   - Fatigue/Lethargy: 0-10% v 0-3% for placebo - Abd. Pain: 0-11% v 0% placebo (20% at 200mg  bid) - Dizziness:  0-11% v 0-3% for placebo (20% at 200mg  bid) - Cough 0% v 0% - Study withdrawal -  0% v 0% (4-20% at high dose)   - SAE - 0% v 0% - All side effects mild - moderate - No changes in labs, EKG and vitals including PMN per PI meeting slide - though in 1 study I came across transient decrease in PMN  3. COPD - data from   [place] meeting in spring 2017 and  IB April 2017 -  AECOPD and CAT scores better with drug after 1 year   - No change in PMN, EKG, Vitals, Labs, Spirometry   - AE - any event is similar for Placebo  V Drug  (52-77% v 55-80%)    - spl AE comments   - Cystitis 3% placebo v 5% IP 75mg  bid   - Back pain: 0% placebo v 5% IP 75mg  bid   - Nasopharyngitis: 33% placebo v 29% IP 75mg  bid   - Headache: 8% placebov v 9% IP 75mg  bid   - Abd Pain Ipper 6% placbo v 0% IP 75mg  bid  -    - SAE - More in placebo than drug (31 events v 10 events); when PI deemed 2 drug related it was placebo group!!   - No change in infection rate:  Placebo v Drug (50% v 42%) -  known to produce mild to moderate headache and diarrhea. No known influence on vital signs, ECG, spirometry, labs when compared to placebo. No evidence of increased infections or neutropenia ........................................................................................................................................................................ Subject Erin Li, DOB 1940/11/06, is present today  for research visit for the purpose of Randomization Visit 3 protocol.   Subject completed PRO questionaires upon arrival. Conmeds reviewed, no changes. Subject states she feels at baseline with COPD and denies any new problems at this time. This coordinator obtained Vital signs and EKG per protocol. The pre-spirometry was obtained and 4 puffs of albuterol was administered at 0951. During the 15 minute waiting period the machine experienced a technical malfunction and the Pre-Dose tests were erased.  This was discussed with the PI and  the decision was made to have the subject wait for 4 hours for albuterol washout, and then repeat the pre/post spirometry. The PI completed the brief physical exam per protocol.  Spirometry was initiated again at 1410, 4 puffs of albuterol were administered at 1413.  Eligibility was then verified with the PI. Blood work completed and processed per protocol. Subject was registered in [place] by this coordinator. This coordinator was present for bottle assignment and dispensing along with Doreatha Martin, RN. This coordinator witnessed the subject taking her first dose of drug and remained with the subject for 30 minutes following the administration. The subject did not have any signs of an adverse event prior to leaving. See also Doreatha Martin, RN documentation of visit.

## 2016-07-26 NOTE — Progress Notes (Signed)
Title: Randomised, Double-Blind (Sponsor Open), Placebo-Controlled, Multicentre, Dose Ranging Study to Evaluate the Efficacy and Safety of Danirixin Tablets Administered Twice Daily Compared With Placebo for 24 Weeks in Adults With COPD  Sponsor: [employer] , Protoocol Number: [id number], NCT Trial #: [id number]  Synopsis:  Following baseline assessments collected over a 7 day period participants will be randomized (1:1:1:1:1:1) to receive one of five dose strengths of danirixin (5 milligram [mg], 10 mg, 25 mg, 35 mg and 50 mg) or placebo. Study treatment will be administered orally twice daily for 24 weeks. Participants will continue with their standard of care inhaled medications during study. Follow up will continue up to 28 days post last dose  Key Inclusion Criteria: age 40-80, COPD with fev1 >/= 40%, ratio <0.7, >/= 2 copd exacerbations in past 1 year treated with either antibiotics or steroids, but >/= 14 days since last antibiotic or steroid for exacerbation, >/= 10 pack smoking history  Key Exclusion Criteria: non-COPD lung disease, pulse ox < 88% at rest on room air (i.e, restting o2 patients excluded), active phase of pulmonary rehabilitation (maintenance ok), previous lung surgery, QTc prolongation, HIV positive etc..   Key end points: Changes in respiratory symptoms and copd exacerbations and safety of DNX  Key features of DNX:   selective CXC chemokine receptor (CXCR2) antagonist . Positive trends in COPD exacerbation, symptoms and health status  Safety of DNX: As of April 2017 (5 completed Phase 1 studies, and 1 Phase 2A study of moderate copd patients with 45 patients):[name] Most side effects are seen at 100mg  bid (current study max is 50mg  bid)  Phase 1 Normal Adults - dose </= 50mg  bid Study drug -    placebo  General All side effects mild - moderate. No changes in labs, EKG and vitals including PMN per PI meeting slide  x  Any adverse event 30% 13%  Headache 10-20% 0-17%   Fatigue/Lethargy 0-10% 0-3%  Abd Pain 0-11% (20% at 200mg  bid) 0%   Cough 0% 0%  Study withdrawal 0% 0%  SAE 0% 0%   - 3. COPD  COPD patients -data from IB April 2017 and PI meeting Spring 2017 Study drug Placebo  Efficacy  AECOPD and CAT scores better with drug after 1 year x  General No change in PMN, EKG, Vitals, Labs, Spirometry but produces mild to moderate headache and diarrhea  x  Any AE 52-77% 55-80%  Cystitis 5% at 75mg  bid 3%  Backpain 5% at 75mg  bid 0%  Nasopharyngitis 29% at 75mg  bid 33%  Headache 9% at 75mg  bid 8%  Upper abdominal pain 0% at 75mg  bid 6%  Infection rate 42% 50%  SAE 10 events 31 events   ........................................................................................................................................................................  PI ENCOUNTER NOTE  S: this is randomization visit 07/26/2016 . Subject Erin Li 05-21-1941 is here for above research study. This is a direct face to face encounter. She again understood she is here for research and is on voluntary basis and confirmed willingness to participate. Actigraph substudy is on hold per sponsor and she wont to do it even though she is willing . recheck elgibility this visit. She did questionnairs  PFT machine crashed earlier and had to wait for albuterol washout and redo pft in afternoon  Symptoms are stable without change. No new issues. No AEs. No SAEs. No change in meds  O Non-focal exam  Labs  - labs done 07/26/2016 - pending result - labs from prior visit -reviewed for eligibility and all satisfactory  - ekg 07/26/2016 -  x 3 done per protocol - sinus brady without change and is normal for her. QTcFriderica is acceptable -spirometry - moderate obstruction without BD response (done per protocol)   A Research Subject Moderate COPD  P Meets criteria for randomization - patient was randomized.  Noted 1. She is going on a cruise - subject safety card  for unblinding and emergency hotline numbers was given to subject. This is not [place]-IRB approved though details are correct. Per info given to me by my study team this is a study wide/all site issue that is due to [place] [facility] IRB issues. DUe to patient safety issues we had to give the card to her despite not being IRB approved  2. She will not participate in actigraph study  3. Throughout day I kept in touch with study staff through calls + spent 69min face to face evaluation and all questions answered   Dr. Brand Males, M.D., [facility].C.P Pulmonary and Critical Care Medicine Staff Physician [place] [facility] Pager: [phone], If no answer or between  15:00h - 7:00h: call [phone]  07/26/2016 8:06 PM     Throughout course of day

## 2016-07-26 NOTE — Patient Instructions (Addendum)
Research subject  Chronic obstructive pulmonary disease moderate ([place])

## 2016-08-11 ENCOUNTER — Telehealth: Payer: Self-pay | Admitting: *Deleted

## 2016-08-11 NOTE — Telephone Encounter (Signed)
On [id number] at 2018, this study coordinator called subject Erin Li, DOB: [id number] to discuss Visit 4 appointment that was scheduled for [phone] at 1100am. Due to inclement weather in the area in the form of freezing rain, snow, and sleet with accumulation on the roadways, the subject is unable to travel the 30 miles to the study site to perform the required procedures for Visit 4. The subject denied any new complaints at this time.  The subject was advised by this coordinator to continue completing the e-diary each evening and to continue to take her study medication twice a day until the visit can be completed next week. Subject stated understanding of the instructions. Subject is aware of the emergency contact number for the physician on call if needed.   [place] Bing, [place], Research Assistant

## 2016-08-12 ENCOUNTER — Encounter (HOSPITAL_COMMUNITY): Payer: Self-pay

## 2016-08-12 ENCOUNTER — Emergency Department (HOSPITAL_COMMUNITY)
Admission: EM | Admit: 2016-08-12 | Discharge: 2016-08-12 | Disposition: A | Discharge status: Home / Self Care | Payer: Medicare Other | Attending: Emergency Medicine | Admitting: Emergency Medicine

## 2016-08-12 DIAGNOSIS — J449 Chronic obstructive pulmonary disease, unspecified: Secondary | ICD-10-CM | POA: Diagnosis not present

## 2016-08-12 DIAGNOSIS — L03115 Cellulitis of right lower limb: Secondary | ICD-10-CM | POA: Diagnosis not present

## 2016-08-12 DIAGNOSIS — L03031 Cellulitis of right toe: Secondary | ICD-10-CM

## 2016-08-12 DIAGNOSIS — Z87891 Personal history of nicotine dependence: Secondary | ICD-10-CM | POA: Insufficient documentation

## 2016-08-12 DIAGNOSIS — I10 Essential (primary) hypertension: Secondary | ICD-10-CM | POA: Diagnosis not present

## 2016-08-12 DIAGNOSIS — I2510 Atherosclerotic heart disease of native coronary artery without angina pectoris: Secondary | ICD-10-CM | POA: Diagnosis not present

## 2016-08-12 DIAGNOSIS — R21 Rash and other nonspecific skin eruption: Secondary | ICD-10-CM | POA: Diagnosis present

## 2016-08-12 LAB — CBC WITH DIFFERENTIAL/PLATELET
BASOS ABS: 0.1 10*3/uL (ref 0.0–0.1)
Basophils Relative: 1 %
EOS PCT: 9 %
Eosinophils Absolute: 0.7 10*3/uL (ref 0.0–0.7)
HCT: 41.2 % (ref 36.0–46.0)
Hemoglobin: 13.6 g/dL (ref 12.0–15.0)
LYMPHS PCT: 20 %
Lymphs Abs: 1.4 10*3/uL (ref 0.7–4.0)
MCH: 32.5 pg (ref 26.0–34.0)
MCHC: 33 g/dL (ref 30.0–36.0)
MCV: 98.6 fL (ref 78.0–100.0)
MONO ABS: 0.6 10*3/uL (ref 0.1–1.0)
Monocytes Relative: 8 %
Neutro Abs: 4.4 10*3/uL (ref 1.7–7.7)
Neutrophils Relative %: 62 %
PLATELETS: 308 10*3/uL (ref 150–400)
RBC: 4.18 MIL/uL (ref 3.87–5.11)
RDW: 14.2 % (ref 11.5–15.5)
WBC: 7 10*3/uL (ref 4.0–10.5)

## 2016-08-12 LAB — URINALYSIS, ROUTINE W REFLEX MICROSCOPIC
Bilirubin Urine: NEGATIVE
GLUCOSE, UA: NEGATIVE mg/dL
Hgb urine dipstick: NEGATIVE
KETONES UR: NEGATIVE mg/dL
LEUKOCYTES UA: NEGATIVE
Nitrite: NEGATIVE
Protein, ur: NEGATIVE mg/dL
Specific Gravity, Urine: 1.016 (ref 1.005–1.030)
pH: 5 (ref 5.0–8.0)

## 2016-08-12 LAB — COMPREHENSIVE METABOLIC PANEL
ALBUMIN: 3.7 g/dL (ref 3.5–5.0)
ALK PHOS: 79 U/L (ref 38–126)
ALT: 19 U/L (ref 14–54)
AST: 23 U/L (ref 15–41)
Anion gap: 9 (ref 5–15)
BILIRUBIN TOTAL: 0.5 mg/dL (ref 0.3–1.2)
BUN: 12 mg/dL (ref 6–20)
CALCIUM: 9.8 mg/dL (ref 8.9–10.3)
CO2: 26 mmol/L (ref 22–32)
CREATININE: 0.76 mg/dL (ref 0.44–1.00)
Chloride: 105 mmol/L (ref 101–111)
GFR calc Af Amer: >60 mL/min (ref >60)
GLUCOSE: 106 mg/dL — AB (ref 65–99)
Potassium: 4.4 mmol/L (ref 3.5–5.1)
Sodium: 140 mmol/L (ref 135–145)
TOTAL PROTEIN: 7.3 g/dL (ref 6.5–8.1)

## 2016-08-12 LAB — I-STAT CG4 LACTIC ACID, ED: Lactic Acid, Venous: 0.97 mmol/L (ref 0.5–1.9)

## 2016-08-12 MED ORDER — CEPHALEXIN 500 MG PO CAPS: 500.0000 mg | ORAL_CAPSULE | Freq: Three times a day (TID) | ORAL | 0 refills | Status: AC

## 2016-08-12 MED ORDER — CEPHALEXIN 250 MG PO CAPS
500.0000 mg | ORAL_CAPSULE | Freq: Once | ORAL | Status: AC
  Administered 2016-08-12: 500 mg via ORAL
  Filled 2016-08-12: qty 2

## 2016-08-12 NOTE — ED Provider Notes (Signed)
[facility] DEPT Provider Note   CSN: [id number] Arrival date & time: 08/12/16  1143     History   Chief Complaint Chief Complaint  Patient presents with  . wound/foot infection    HPI Erin Li is a 75 y.o. female.  The history is provided by the patient.  Rash   This is a new problem. Episode onset: 3-4 days ago. The problem has been gradually worsening. Associated with: blister on great toe. There has been no fever. The rash is present on the right foot and right toes. The pain is mild. The pain has been constant since onset. She has tried nothing for the symptoms.    Past Medical History:  Diagnosis Date  . Coronary artery disease   . Diverticulosis   . HTN (hypertension)   . Lung nodule     Patient Active Problem List   Diagnosis Date Noted  . Research study patient 11/20/2014  . COLD (chronic obstructive lung disease) ([place]) 08/07/2014  . Cough 01/13/2013  . COPD exacerbation ([place]) 12/11/2012  . COPD (chronic obstructive pulmonary disease) ([place]) 09/25/2012  . Lung nodule 07/14/2012  . Hx of smoking 07/14/2012    Past Surgical History:  Procedure Laterality Date  . CARDIAC CATHETERIZATION    . CORONARY ANGIOPLASTY    . TONSILLECTOMY AND ADENOIDECTOMY      OB History    No data available       Home Medications    Prior to Admission medications   Medication Sig Start Date End Date Taking? Authorizing Provider  albuterol (PROVENTIL HFA;VENTOLIN HFA) 108 (90 BASE) MCG/ACT inhaler Inhale 2 puffs into the lungs every 6 (six) hours as needed.    Historical Provider, MD  Ascorbic Acid (VITAMIN C) 1000 MG tablet Take 1,000 mg by mouth daily.    Historical Provider, MD  b complex vitamins tablet Take 1 tablet by mouth daily.    Historical Provider, MD  calcium-vitamin D 250-100 MG-UNIT per tablet Take 1 tablet by mouth daily.     Historical Provider, MD  cephALEXin (KEFLEX) 500 MG capsule Take 1 capsule (500 mg total) by mouth 3 (three) times  daily. Patient not taking: Reported on 07/18/2016 07/12/16   Brand Males, MD  diltiazem (CARDIZEM) 60 MG tablet Take 60 mg by mouth 3 (three) times daily.    Historical Provider, MD  fish oil-omega-3 fatty acids 1000 MG capsule Take 2 g by mouth daily.    Historical Provider, MD  fluticasone furoate-vilanterol (BREO ELLIPTA) 100-25 MCG/INH AEPB Inhale 1 puff into the lungs daily. Patient not taking: Reported on 07/18/2016 12/07/15   Brand Males, MD  glucosamine-chondroitin 500-400 MG tablet Take 1 tablet by mouth every morning.    Historical Provider, MD  magnesium 30 MG tablet Take 30 mg by mouth 2 (two) times daily. 1 tabs daily (dose unknown)    Historical Provider, MD  metoprolol succinate (TOPROL-XL) 50 MG 24 hr tablet Take 50 mg by mouth daily. Take with or immediately following a meal.    Historical Provider, MD  predniSONE (DELTASONE) 10 MG tablet 40 mg x1 day, then 30 mg x1 day, then 20 mg x1 day, then 10 mg x1 day, and then 5 mg x1 day and stop Patient not taking: Reported on 07/18/2016 07/12/16   Brand Males, MD  selenium 50 MCG TABS Take 50 mcg by mouth daily.    Historical Provider, MD  senna (SENOKOT) 8.6 MG tablet Take 1 tablet by mouth daily.    Historical Provider, MD  umeclidinium-vilanterol (ANORO ELLIPTA) 62.5-25 MCG/INH AEPB Inhale 1 puff into the lungs daily. 04/24/16   Brand Males, MD  vitamin B-12 (CYANOCOBALAMIN) 1000 MCG tablet Take 1,000 mcg by mouth daily.    Historical Provider, MD  vitamin E 200 UNIT capsule Take 200 Units by mouth daily.    Historical Provider, MD    Family History Family History  Problem Relation Age of Onset  . Heart attack Father   . Kidney disease Father   . COPD Mother   . Lung cancer Brother 70  . Pulmonary fibrosis Son     had lung transplant    Social History Social History  Substance Use Topics  . Smoking status: Former Smoker    Packs/day: 0.75    Years: 50.00    Quit date: 11/11/2011  . Smokeless tobacco: Not  on file  . Alcohol use Yes     Allergies   Patient has no known allergies.   Review of Systems Review of Systems  Skin: Positive for rash.  All other systems reviewed and are negative.    Physical Exam Updated Vital Signs BP 157/59 (BP Location: Left Arm)   Pulse 67   Temp 98.1 F (36.7 C) (Oral)   Resp 15   Ht 5\' 5"  (1.651 m)   Wt 147 lb (66.7 kg)   LMP 09/04/1988 (Approximate)   SpO2 96%   BMI 24.46 kg/m   Physical Exam  Constitutional: She is oriented to person, place, and time. She appears well-developed and well-nourished. No distress.  HENT:  Head: Normocephalic.  Nose: Nose normal.  Eyes: Conjunctivae are normal.  Neck: Neck supple. No tracheal deviation present.  Cardiovascular: Normal rate and regular rhythm.   Pulmonary/Chest: Effort normal. No respiratory distress.  Abdominal: Soft. She exhibits no distension.  Musculoskeletal:       Right foot: There is swelling (and erythma of right great toe extending over medial foot without fluctuance or induration, central small mechanical blister ). There is normal capillary refill and no deformity.       Feet:  Neurological: She is alert and oriented to person, place, and time.  Skin: Skin is warm and dry.  Psychiatric: She has a normal mood and affect.     ED Treatments / Results  Labs (all labs ordered are listed, but only abnormal results are displayed) Labs Reviewed  COMPREHENSIVE METABOLIC PANEL - Abnormal; Notable for the following:       Result Value   Glucose, Bld 106 (*)    All other components within normal limits  CBC WITH DIFFERENTIAL/PLATELET  URINALYSIS, ROUTINE W REFLEX MICROSCOPIC  I-STAT CG4 LACTIC ACID, ED    EKG  EKG Interpretation None       Radiology No results found.  Procedures Procedures (including critical care time)  Medications Ordered in ED Medications  cephALEXin (KEFLEX) capsule 500 mg (not administered)     Initial Impression / Assessment and Plan / ED  Course  I have reviewed the triage vital signs and the nursing notes.  Pertinent labs & imaging results that were available during my care of the patient were reviewed by me and considered in my medical decision making (see chart for details).  Clinical Course     75 y.o. female presents with redness and pain in right foot after developing a blister from her shoe. Will treat empirically with keflex for cellulitis. No systemic symptoms or WBC elevation. No fluctuance or other sign of abscess. Return precautions discussed for worsening and Pt  to schedule recheck with PCP.   Final Clinical Impressions(s) / ED Diagnoses   Final diagnoses:  Cellulitis of great toe of right foot    New Prescriptions Discharge Medication List as of 08/12/2016  2:46 PM       Leo Grosser, MD 08/12/16 1735

## 2016-08-12 NOTE — Telephone Encounter (Signed)
PI OVERSIGHT ATTESTATION  I the Principal Investigator (PI) for the above mentioned study attest that I reviewed the above mentioned  clinical research coordinator notes on research subject  Erin Li  born [birth year] . This is a protocol deviation on account of unanticipated inclement weather making it hazardous for subject to travel by any mode to study site.  I  agree with the findings mentioned above. In fact I discussed the situation with the study coordinator over phone yesterday before this documentation by study coordinator took place   Dr. Brand Males, M.D., F.C.C.P., ACRP-CPI Pulmonary and Critical Care Medicine Principal Investigator & Staff Physician [facility] [place] [facility] Pager: [phone], If no answer or between  15:00h - 7:00h: call [phone]  08/12/2016 9:29 AM

## 2016-08-12 NOTE — ED Triage Notes (Signed)
Patient complains of right foot foot pain after having blister while on vacation. Now she has open wound to bottom of foot and under big toe. Denies fever. Pain with ambulation

## 2016-08-14 ENCOUNTER — Encounter (INDEPENDENT_AMBULATORY_CARE_PROVIDER_SITE_OTHER): Payer: Medicare Other | Admitting: *Deleted

## 2016-08-14 DIAGNOSIS — J449 Chronic obstructive pulmonary disease, unspecified: Secondary | ICD-10-CM

## 2016-08-14 DIAGNOSIS — Z006 Encounter for examination for normal comparison and control in clinical research program: Secondary | ICD-10-CM

## 2016-08-14 NOTE — Progress Notes (Signed)
Title: Randomised, Double-Blind (Sponsor Open), Placebo-Controlled, Multicentre, Dose Ranging Study to Evaluate the Efficacy and Safety of Danirixin Tablets Administered Twice Daily Compared With Placebo for 24 Weeks in Adults With COPD  Sponsor: [employer] , Protoocol Number: [id number], NCT Trial #: [id number]  Synopsis:  Following baseline assessments collected over a 7 day period participants will be randomized (1:1:1:1:1:1) to receive one of five dose strengths of danirixin (5 milligram [mg], 10 mg, 25 mg, 35 mg and 50 mg) or placebo. Study treatment will be administered orally twice daily for 24 weeks. Participants will continue with their standard of care inhaled medications during study. Follow up will continue up to 28 days post last dose  Key Inclusion Criteria: age 84-80, COPD with fev1 >/= 40%, ratio <0.7, >/= 2 copd exacerbations in past 1 year treated with either antibiotics or steroids, but >/= 14 days since last antibiotic or steroid for exacerbation, >/= 10 pack smoking history  Key Exclusion Criteria: non-COPD lung disease, pulse ox < 88% at rest on room air (i.e, restting o2 patients excluded), active phase of pulmonary rehabilitation (maintenance ok), previous lung surgery, QTc prolongation, HIV positive etc..   Key end points: Changes in respiratory symptoms and copd exacerbations and safety of DNX  Key features of DNX:   selective CXC chemokine receptor (CXCR2) antagonist . Positive trends in COPD exacerbation, symptoms and health status  Safety of DNX: As of April 2017 (5 completed Phase 1 studies, and 1 Phase 2A study of moderate copd patients with 45 patients):[name] Most side effects are seen at 100mg  bid (current study max is 50mg  bid)  Phase 1 Normal Adults - dose </= 50mg  bid Study drug -    placebo  General All side effects mild - moderate. No changes in labs, EKG and vitals including PMN per PI meeting slide  x  Any adverse event 30% 13%  Headache 10-20% 0-17%   Fatigue/Lethargy 0-10% 0-3%  Abd Pain 0-11% (20% at 200mg  bid) 0%   Cough 0% 0%  Study withdrawal 0% 0%  SAE 0% 0%   - 3. COPD  COPD patients -data from IB April 2017 and PI meeting Spring 2017 Study drug Placebo  Efficacy  AECOPD and CAT scores better with drug after 1 year x  General No change in PMN, EKG, Vitals, Labs, Spirometry but produces mild to moderate headache and diarrhea  x  Any AE 52-77% 55-80%  Cystitis 5% at 75mg  bid 3%  Backpain 5% at 75mg  bid 0%  Nasopharyngitis 29% at 75mg  bid 33%  Headache 9% at 75mg  bid 8%  Upper abdominal pain 0% at 75mg  bid 6%  Infection rate 42% 50%  SAE 10 events 31 events   ........................................................................................................................................................................  Clinical Research Coordinator / Research RN note : This visit for Subject Erin Li with May 24, 1941 on 08/14/2016 for the above protocol is Visit 4 and is for purpose of Research. The consent for this encounter is under Protocol Amendment 1 and is currently IRB approved. Subject expressed continued interest and consent in continuing as a study subject. Subject thanked for participation. The purpose and/or following were chief complaints for this encounter :  Re-consent to Protocol Amendment 1 Visit 4 assessments  In this visit 08/14/2016 the subject will be not evaluated by an investigator.   At the beginning of the visit, Subject was presented with the newly updated informed consent for protocol amendment 1.  Changes were explained to subject and subject read updated consent form.  Subject provided ongoing consent, see subject's  research paper chart for documentation.  Subject reported adverse event of cellulitis of right great toe.  Subject began experiencing symptoms on 04/Dec/17 while on vacation with redness and irritation at right great toe.  Subject attributes this to wearing sandals during  her recent cruise.  Subject went to emergency department and was prescribed a 7 day course of antibiotic Keflex (see encounter).  Subject reports significant improvement since antibiotic, though no complete resolution of adverse event.  Added to AE and con med log.  See paper research chart for documentation of drug accountability and compliance.  Subject re-educated and re-instructed on appropriate administration of study drug.  Subject verbalized understanding.  All other procedures completed per protocol.  Signed by Doreatha Martin, RN, BSN, [place] [facility] [phone] [facility]  [place], [place] 2:38 PM 08/14/2016

## 2016-08-14 NOTE — Progress Notes (Signed)
Clinical Research Coordinator / Research RN note : This visit for Subject Erin Li with DOB 31-Oct-1940 on 08/14/2016 for the above protocol is Visit 4  and is for purpose of research . The consent for this encounter is under Protocol Amendment 1 and is currently IRB approved. Subject expressed continued interest and consent in continuing as a study subject. Subject thanked for participation. The purpose and/or following were chief complaints for this encounter : Re-consent to Protocol Amendment 1 (see note by Doreatha Martin, RN) Visit 4 assessments  In this visit 08/14/2016 the subject will not be evaluated by an investigator.   Refer to progress note by Doreatha Martin, RN regarding study re-consent and assessments performed by that coordinator.   This coordinator logged into [employer] [employer] portal to retrieve the compliance report for the subjects e-diary entries, however the e-diary was not syncing with the online portal. A call was placed to the Help Desk and this coordinator was advised that they have received the transmission of the diaries, and confirmed the subject completed all required diaries. They stated the issue was with the online portal and the compliance report that we are trying to download. The issue has been elevated to the IT team with [facility] and we will be notified when this has been corrected. Again this coordinator verified with the [facility] representative that all of the subjects e-diaries have been completed per protocol.   Laboratory Assessments completed per protocol.   Signed by  [place], [place] 3:14 PM 08/14/2016

## 2016-08-17 ENCOUNTER — Telehealth: Payer: Self-pay | Admitting: Internal Medicine

## 2016-08-17 NOTE — Telephone Encounter (Signed)
On [id number] this coordinator received an email notification from the [facility] system for the [place] study stating that subject [id number] eDiaries were not being completed. This coordinator called and spoke with subject [id number] Erin Li, [place] in regards to this issue. The subject states she is completing the diaries nightly as required. This coordinator walked through the steps with the subject to get the e-diary device to connect to her home WIFI. This process was completed and the subject received a message that the device was not connected to the home WiFi. The subject was advised to continue to complete the diaries each night and now the device is connected, the information should now sync with the study portal. Subject stated understanding.   [place] Bing, [place] Study Coordinator

## 2016-08-23 ENCOUNTER — Encounter: Payer: Self-pay | Admitting: *Deleted

## 2016-08-23 ENCOUNTER — Other Ambulatory Visit: Payer: Self-pay | Admitting: Internal Medicine

## 2016-08-23 ENCOUNTER — Encounter: Payer: Medicare Other | Admitting: *Deleted

## 2016-08-23 ENCOUNTER — Other Ambulatory Visit: Payer: Self-pay | Admitting: Acute Care

## 2016-08-23 DIAGNOSIS — Z006 Encounter for examination for normal comparison and control in clinical research program: Secondary | ICD-10-CM

## 2016-08-23 DIAGNOSIS — J449 Chronic obstructive pulmonary disease, unspecified: Secondary | ICD-10-CM

## 2016-08-23 DIAGNOSIS — Z87891 Personal history of nicotine dependence: Secondary | ICD-10-CM

## 2016-08-23 NOTE — Progress Notes (Signed)
Title: Randomised, Double-Blind (Sponsor Open), Placebo-Controlled, Multicentre, Dose Ranging Study to Evaluate the Efficacy and Safety of Danirixin Tablets Administered Twice Daily Compared With Placebo for 24 Weeks in Adults With COPD  Sponsor: [employer] , Protoocol Number: [id number], NCT Trial #: [id number]  Synopsis:  Following baseline assessments collected over a 7 day period participants will be randomized (1:1:1:1:1:1) to receive one of five dose strengths of danirixin (5 milligram [mg], 10 mg, 25 mg, 35 mg and 50 mg) or placebo. Study treatment will be administered orally twice daily for 24 weeks. Participants will continue with their standard of care inhaled medications during study. Follow up will continue up to 28 days post last dose  Key Inclusion Criteria: age 2-80, COPD with fev1 >/= 40%, ratio <0.7, >/= 2 copd exacerbations in past 1 year treated with either antibiotics or steroids, but >/= 14 days since last antibiotic or steroid for exacerbation, >/= 10 pack smoking history  Key Exclusion Criteria: non-COPD lung disease, pulse ox < 88% at rest on room air (i.e, restting o2 patients excluded), active phase of pulmonary rehabilitation (maintenance ok), previous lung surgery, QTc prolongation, HIV positive etc..   Key end points: Changes in respiratory symptoms and copd exacerbations and safety of DNX  Key features of DNX:   selective CXC chemokine receptor (CXCR2) antagonist . Positive trends in COPD exacerbation, symptoms and health status  Safety of DNX: As of April 2017 (5 completed Phase 1 studies, and 1 Phase 2A study of moderate copd patients with 45 patients):[name] Most side effects are seen at 100mg  bid (current study max is 50mg  bid)  Phase 1 Normal Adults - dose </= 50mg  bid Study drug -    placebo  General All side effects mild - moderate. No changes in labs, EKG and vitals including PMN per PI meeting slide  x  Any adverse event 30% 13%  Headache 10-20% 0-17%   Fatigue/Lethargy 0-10% 0-3%  Abd Pain 0-11% (20% at 200mg  bid) 0%   Cough 0% 0%  Study withdrawal 0% 0%  SAE 0% 0%   - 3. COPD  COPD patients -data from IB April 2017 and PI meeting Spring 2017 Study drug Placebo  Efficacy  AECOPD and CAT scores better with drug after 1 year x  General No change in PMN, EKG, Vitals, Labs, Spirometry but produces mild to moderate headache and diarrhea  x  Any AE 52-77% 55-80%  Cystitis 5% at 75mg  bid 3%  Backpain 5% at 75mg  bid 0%  Nasopharyngitis 29% at 75mg  bid 33%  Headache 9% at 75mg  bid 8%  Upper abdominal pain 0% at 75mg  bid 6%  Infection rate 42% 50%  SAE 10 events 31 events   ........................................................................................................................................................................  Clinical Research Coordinator / Research RN note : This visit for Subject Erin Li with DOB 30-Sep-1940 on 08/23/2016 for the above protocol is Visit 5 and is for purpose of research. The consent for this encounter is under Protocol Version Amendment 1 and is currently IRB approved. Subject expressed continued interest and consent in continuing as a study subject. Subject thanked for participation. The purpose and/or following were chief complaints for this encounter :  ConMed review AE Review ECG Lab Assessments Drug Accountability IP dispensing  This coordinator reviewed the status of cellulitis of the right great toe. The subject stated that the antibiotic course was completed on 15Dec2017. The right great toe was viewed by this coordinator and Erin Martin, RN and there was no redness or swelling present.  The subject subsequently  mentioned that her small Erin Li pet dog jumped up on her leg on 16Dec2017 and caused the skin on her lower right leg to "break" and bleed. This coordinator, Erin Martin, RN, and PI Dr. Chase Caller viewed the area on the right lower leg. A small area with a  resolving skin abrasion was present. Refer to AE log located in the subjects paper source binder for details.   After all required procedures were completed per protocol order, a new bottle of IP was dispensed to the subject. The bottle number assigned was verified by this coordinator and Erin Martin, RN. Refer to the subjects paper source binder and the master log for details.   Subject was reminded to complete bedtime diary every evening, and to bring the e-diary, IP, rescue inhaler to the next visit. Subject also reminded to hold the morning dose of IP the day of Visit 6. Subject stated understanding.   In this visit 08/23/2016 the subject was evaluated by investigator named Brand Males, MD . This research coordinator has verified that the investigator is uptodate with his training logs  Signed by  [place] Bing, [place] Coordinator [facility]  [place], [place] 7:16 PM 08/23/2016

## 2016-09-01 ENCOUNTER — Ambulatory Visit (INDEPENDENT_AMBULATORY_CARE_PROVIDER_SITE_OTHER): Payer: Medicare Other | Admitting: Acute Care

## 2016-09-01 ENCOUNTER — Ambulatory Visit (INDEPENDENT_AMBULATORY_CARE_PROVIDER_SITE_OTHER)
Admission: RE | Admit: 2016-09-01 | Discharge: 2016-09-01 | Disposition: A | Discharge status: Home / Self Care | Payer: Medicare Other | Source: Ambulatory Visit | Attending: Acute Care | Admitting: Acute Care

## 2016-09-01 ENCOUNTER — Encounter: Payer: Self-pay | Admitting: Acute Care

## 2016-09-01 DIAGNOSIS — Z87891 Personal history of nicotine dependence: Secondary | ICD-10-CM | POA: Diagnosis not present

## 2016-09-01 NOTE — Progress Notes (Signed)
Shared Decision Making Visit Lung Cancer Screening Program [id number])   Eligibility:  Age 75 y.o.  Pack Years Smoking History Calculation  (# packs/per year x # years smoked)  Recent History of coughing up blood  no  Unexplained weight loss? no ( >Than 15 pounds within the last 6 months )  Prior History Lung / other cancer no (Diagnosis within the last 5 years already requiring surveillance chest CT Scans).  Smoking Status Former Smoker  Former Smokers: Years since quit: 4 years  Quit Date: 2013  Visit Components:  Discussion included one or more decision making aids. yes  Discussion included risk/benefits of screening. yes  Discussion included potential follow up diagnostic testing for abnormal scans. yes  Discussion included meaning and risk of over diagnosis. yes  Discussion included meaning and risk of False Positives. yes  Discussion included meaning of total radiation exposure. yes  Counseling Included:  Importance of adherence to annual lung cancer LDCT screening. yes  Impact of comorbidities on ability to participate in the program. yes  Ability and willingness to under diagnostic treatment. yes  Smoking Cessation Counseling:  Current Smokers:   Discussed importance of smoking cessation. NA  Information about tobacco cessation classes and interventions provided to patient. NA  Patient provided with "ticket" for LDCT Scan. yes  Symptomatic Patient. no  Counseling NA  Diagnosis Code: Tobacco Use Z72.0  Asymptomatic Patient No  Counseling NA  Former Smokers:   Discussed the importance of maintaining cigarette abstinence. yes  Diagnosis Code: Personal History of Nicotine Dependence. [id number]  Information about tobacco cessation classes and interventions provided to patient. Yes  Patient provided with "ticket" for LDCT Scan. yes  Written Order for Lung Cancer Screening with LDCT placed in Epic. Yes (CT Chest Lung Cancer Screening Low Dose W/O  CM) [id number] Z12.2-Screening of respiratory organs Z87.891-Personal history of nicotine dependence  I spent 20 minutes of face to face time with Erin Li discussing the risks and benefits of lung cancer screening. We viewed a power point together that explained in detail the above noted topics. We took the time to pause the power point at intervals to allow for questions to be asked and answered to ensure understanding. We discussed that she had taken the single most powerful action possible to decrease her risk of developing lung cancer when she quit smoking. I counseled her to remain smoke free, and to contact me if she ever had the desire to smoke again so that I can provide resources and tools to help support the effort to remain smoke free. We discussed the time and location of the scan, and that either [place] or I will call with the results within  24-48 business hours of receiving them. She  has my card and contact information in the event she needs to speak with me, in addition to a copy of the power point we reviewed as a resource. She verbalized understanding of all of the above and had no further questions upon leaving the office.    Magdalen Spatz, NP 09/01/2016

## 2016-09-05 ENCOUNTER — Telehealth: Payer: Self-pay | Admitting: Acute Care

## 2016-09-05 DIAGNOSIS — Z87891 Personal history of nicotine dependence: Secondary | ICD-10-CM

## 2016-09-05 NOTE — Telephone Encounter (Signed)
I have called Erin Li right with the results of her low-dose screening CT. I explained that her scan was read as a lung rads 3, probably benign findings. Recommendation per radiology is for a short-term follow-up scan in 6 months with a repeat low dose CT chest follow-up without contrast. I explained that the same nodules have been present on her scan for the last 4 years, they're most likely benign, but they have slowly progressed. I explained that the radiologist is wanting to have a repeat scan in 6 months to allow him to compare the nodules and an apples to  apples comparison. She verbalized understanding of the above. I told her we will order and schedule her scan for the end of June 2018. Explained that I would fax a copy of the report to her primary care physician. She verbalized understanding of the above and had no further questions at completion of the call.

## 2016-09-18 DIAGNOSIS — J439 Emphysema, unspecified: Secondary | ICD-10-CM

## 2016-09-18 DIAGNOSIS — Z006 Encounter for examination for normal comparison and control in clinical research program: Secondary | ICD-10-CM

## 2016-09-18 NOTE — Progress Notes (Signed)
Title: Randomised, Double-Blind (Sponsor Open), Placebo-Controlled, Multicentre, Dose Ranging Study to Evaluate the Efficacy and Safety of Danirixin Tablets Administered Twice Daily Compared With Placebo for 24 Weeks in Adults With COPD  Sponsor: [employer] , Protoocol Number: [id number], NCT Trial #: [id number]  Synopsis:  Following baseline assessments collected over a 7 day period participants will be randomized (1:1:1:1:1:1) to receive one of five dose strengths of danirixin (5 milligram [mg], 10 mg, 25 mg, 35 mg and 50 mg) or placebo. Study treatment will be administered orally twice daily for 24 weeks. Participants will continue with their standard of care inhaled medications during study. Follow up will continue up to 28 days post last dose  Key Inclusion Criteria: age 3-80, COPD with fev1 >/= 40%, ratio <0.7, >/= 2 copd exacerbations in past 1 year treated with either antibiotics or steroids, but >/= 14 days since last antibiotic or steroid for exacerbation, >/= 10 pack smoking history  Key Exclusion Criteria: non-COPD lung disease, pulse ox < 88% at rest on room air (i.e, restting o2 patients excluded), active phase of pulmonary rehabilitation (maintenance ok), previous lung surgery, QTc prolongation, HIV positive etc..   Key end points: Changes in respiratory symptoms and copd exacerbations and safety of DNX  Key features of DNX:   selective CXC chemokine receptor (CXCR2) antagonist . Positive trends in COPD exacerbation, symptoms and health status  Safety of DNX: As of April 2017 (5 completed Phase 1 studies, and 1 Phase 2A study of moderate copd patients with 45 patients):[name] Most side effects are seen at 100mg  bid (current study max is 50mg  bid)  Phase 1 Normal Adults - dose </= 50mg  bid Study drug -    placebo  General All side effects mild - moderate. No changes in labs, EKG and vitals including PMN per PI meeting slide  x  Any adverse event 30% 13%  Headache 10-20% 0-17%   Fatigue/Lethargy 0-10% 0-3%  Abd Pain 0-11% (20% at 200mg  bid) 0%   Cough 0% 0%  Study withdrawal 0% 0%  SAE 0% 0%   - 3. COPD  COPD patients -data from IB April 2017 and PI meeting Spring 2017 Study drug Placebo  Efficacy  AECOPD and CAT scores better with drug after 1 year x  General No change in PMN, EKG, Vitals, Labs, Spirometry but produces mild to moderate headache and diarrhea  x  Any AE 52-77% 55-80%  Cystitis 5% at 75mg  bid 3%  Backpain 5% at 75mg  bid 0%  Nasopharyngitis 29% at 75mg  bid 33%  Headache 9% at 75mg  bid 8%  Upper abdominal pain 0% at 75mg  bid 6%  Infection rate 42% 50%  SAE 10 events 31 events   ........................................................................................................................................................................ Clinical Research Coordinator / Research RN note : This visit for Subject Annagail Takemura with DOB 1940/09/27 on 09/18/2016 for the above protocol is Visit # 6 and is for purpose of research . The consent for this encounter is under Protocol Version Amendment 1 and is currently IRB approved. Subject expressed continued interest and consent in continuing as a study subject. Subject thanked for participation.   Protocol required procedures completed by this coordinator. Drug accountability completed and verified by this coordinator and Doreatha Martin, RN. Subject was counseled on IP compliance. Refer to the subjects paper source binder for further documentation.   Prior resolving AE was reviewed with the subject, and she stated on 27Dec2017 the skin abrasion was completely healed and she denies any further complaints. This information has been documented on the  subjects AE log in the paper source binder.   Signed by  [place] Bing, [place] Coordinator [facility]  [place], [place] 1:01 PM 09/18/2016

## 2016-10-16 ENCOUNTER — Ambulatory Visit: Payer: Medicare Other | Admitting: Internal Medicine

## 2016-10-17 ENCOUNTER — Telehealth: Payer: Self-pay | Admitting: *Deleted

## 2016-10-17 NOTE — Telephone Encounter (Signed)
I called Ms. Barger to provide her reminders for her research appointment scheduled for [phone]. Subject reminded to not take any inhalers in the morning, and to not take the study medication in the morning either. Subject will be dosed at the clinic on the day of the visit.  Subject was also reminded to bring her maintenance medications, rescue inhaler, study medication, and e-diary with her to the appointment. Subject stated understanding.   [place] Bing, CMA,  Clinical Coordinator [facility]

## 2016-10-18 ENCOUNTER — Encounter: Payer: Self-pay | Admitting: Internal Medicine

## 2016-10-18 ENCOUNTER — Ambulatory Visit (INDEPENDENT_AMBULATORY_CARE_PROVIDER_SITE_OTHER): Payer: Medicare Other | Admitting: Internal Medicine

## 2016-10-18 VITALS — BP 110/72 | HR 59 | Temp 97.9°F | Resp 12

## 2016-10-18 DIAGNOSIS — Z006 Encounter for examination for normal comparison and control in clinical research program: Secondary | ICD-10-CM

## 2016-10-18 DIAGNOSIS — J449 Chronic obstructive pulmonary disease, unspecified: Secondary | ICD-10-CM | POA: Diagnosis not present

## 2016-10-18 MED ORDER — FLUTICASONE FUROATE-VILANTEROL 100-25 MCG/INH IN AEPB: 1.0000 | INHALATION_SPRAY | Freq: Every day | RESPIRATORY_TRACT | 3 refills | Status: DC

## 2016-10-18 NOTE — Progress Notes (Addendum)
Title: Randomised, Double-Blind (Sponsor Open), Placebo-Controlled, Multicentre, Dose Ranging Study to Evaluate the Efficacy and Safety of Danirixin Tablets Administered Twice Daily Compared With Placebo for 24 Weeks in Adults With COPD  Sponsor: [employer] , Protoocol Number: [id number], NCT Trial #: [id number]  Synopsis:  Following baseline assessments collected over a 7 day period participants will be randomized (1:1:1:1:1:1) to receive one of five dose strengths of danirixin (5 milligram [mg], 10 mg, 25 mg, 35 mg and 50 mg) or placebo. Study treatment will be administered orally twice daily for 24 weeks. Participants will continue with their standard of care inhaled medications during study. Follow up will continue up to 28 days post last dose  Key Inclusion Criteria: age 56-80, COPD with fev1 >/= 40%, ratio <0.7, >/= 2 copd exacerbations in past 1 year treated with either antibiotics or steroids, but >/= 14 days since last antibiotic or steroid for exacerbation, >/= 10 pack smoking history  Key Exclusion Criteria: non-COPD lung disease, pulse ox < 88% at rest on room air (i.e, restting o2 patients excluded), active phase of pulmonary rehabilitation (maintenance ok), previous lung surgery, QTc prolongation, HIV positive etc..   Key end points: Changes in respiratory symptoms and copd exacerbations and safety of DNX  Key features of DNX:   selective CXC chemokine receptor (CXCR2) antagonist . Positive trends in COPD exacerbation, symptoms and health status  Safety of DNX: As of April 2017 (5 completed Phase 1 studies, and 1 Phase 2A study of moderate copd patients with 45 patients):[name] Most side effects are seen at 129m bid (current study max is 549mbid)  Phase 1 Normal Adults - dose </= 5062mid Study drug -    placebo  General All side effects mild - moderate. No changes in labs, EKG and vitals including PMN per PI meeting slide  x  Any adverse event 30% 13%  Headache 10-20% 0-17%   Fatigue/Lethargy 0-10% 0-3%  Abd Pain 0-11% (20% at 200m60md) 0%   Cough 0% 0%  Study withdrawal 0% 0%  SAE 0% 0%   - 3. COPD  COPD patients -data from IB April 2017 and PI meeting Spring 2017 Study drug Placebo  Efficacy  AECOPD and CAT scores better with drug after 1 year x  General No change in PMN, EKG, Vitals, Labs, Spirometry but produces mild to moderate headache and diarrhea  x  Any AE 52-77% 55-80%  Cystitis 5% at 75mg59m 3%  Backpain 5% at 75mg 46m0%  Nasopharyngitis 29% at 75mg b55m3%  Headache 9% at 75mg bi43m  Upper abdominal pain 0% at 75mg bid67m Infection rate 42% 50%  SAE 10 events 31 events   ........................................................................................................................................................................ S: V7 visit on above protocol. As PI I met with subject Erin Li Mealymed: she is still interested in research participation on  Her own voluntary accord.  Cough bigger problem than dyspnea but both are at the mild baseline historical state. No interim AEs or SAEs. Med issue noted. Schedule of events for visit noted and d/w both subject and coordinator on this. Meds reconciled and understood she returned 1 tablet less. This was discussed with coordinator and subject  O Exam non focal - documented in source  EKG - normal  Spiro - rArlyce Harmaned Labs - being done  A   ICD-9-CM ICD-10-CM   1. Research subject V70.7 Z00.6 EKG 12-Lead  2. 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 Brand Males, M.D., [facility].C.P Pulmonary and Critical Care Medicine Staff Physician [place] [facility] Pager: [phone], If no answer or between  15:00h - 7:00h: call [phone]  10/20/2016 12:20 AM

## 2016-10-18 NOTE — Progress Notes (Signed)
Title: Randomised, Double-Blind (Sponsor Open), Placebo-Controlled, Multicentre, Dose Ranging Study to Evaluate the Efficacy and Safety of Danirixin Tablets Administered Twice Daily Compared With Placebo for 24 Weeks in Adults With COPD  Sponsor: [employer] , Protoocol Number: [id number], NCT Trial #: [id number]  Synopsis:  Following baseline assessments collected over a 7 day period participants will be randomized (1:1:1:1:1:1) to receive one of five dose strengths of danirixin (5 milligram [mg], 10 mg, 25 mg, 35 mg and 50 mg) or placebo. Study treatment will be administered orally twice daily for 24 weeks. Participants will continue with their standard of care inhaled medications during study. Follow up will continue up to 28 days post last dose  Key Inclusion Criteria: age 33-80, COPD with fev1 >/= 40%, ratio <0.7, >/= 2 copd exacerbations in past 1 year treated with either antibiotics or steroids, but >/= 14 days since last antibiotic or steroid for exacerbation, >/= 10 pack smoking history  Key Exclusion Criteria: non-COPD lung disease, pulse ox < 88% at rest on room air (i.e, restting o2 patients excluded), active phase of pulmonary rehabilitation (maintenance ok), previous lung surgery, QTc prolongation, HIV positive etc..   Key end points: Changes in respiratory symptoms and copd exacerbations and safety of DNX  Key features of DNX:   selective CXC chemokine receptor (CXCR2) antagonist . Positive trends in COPD exacerbation, symptoms and health status  Safety of DNX: As of April 2017 (5 completed Phase 1 studies, and 1 Phase 2A study of moderate copd patients with 45 patients):[name] Most side effects are seen at 100mg  bid (current study max is 50mg  bid)  Phase 1 Normal Adults - dose </= 50mg  bid Study drug -    placebo  General All side effects mild - moderate. No changes in labs, EKG and vitals including PMN per PI meeting slide  x  Any adverse event 30% 13%  Headache 10-20% 0-17%   Fatigue/Lethargy 0-10% 0-3%  Abd Pain 0-11% (20% at 200mg  bid) 0%   Cough 0% 0%  Study withdrawal 0% 0%  SAE 0% 0%   - 3. COPD  COPD patients -data from IB April 2017 and PI meeting Spring 2017 Study drug Placebo  Efficacy  AECOPD and CAT scores better with drug after 1 year x  General No change in PMN, EKG, Vitals, Labs, Spirometry but produces mild to moderate headache and diarrhea  x  Any AE 52-77% 55-80%  Cystitis 5% at 75mg  bid 3%  Backpain 5% at 75mg  bid 0%  Nasopharyngitis 29% at 75mg  bid 33%  Headache 9% at 75mg  bid 8%  Upper abdominal pain 0% at 75mg  bid 6%  Infection rate 42% 50%  SAE 10 events 31 events   ........................................................................................................................................................................  Clinical Research Coordinator / Research RN note : This visit for Subject Erin Li with [birth year] on 10/18/2016 for the above protocol is Visit/Encounter # 7 and is for purpose of re-consent and Visit 7 procedures. The consent for this encounter is under Protocol Version Amendment 01 and is currently IRB approved. Subject expressed continued interest and consent in continuing as a study subject. Subject thanked for participation. The purpose for this encounter is to re-consent and complete Visit 7 procedures.  This RN presented subject with updated version of informed consent form.  All changes were reviewed with subject.  Subject voluntarily agreed to continue study participation and signed new consent form.  See paper Informed Consent Process Checklist from this date for full documentation of the consent process.  Remainder of visit to be  conducted by  Bing, CMA.  Signed by Doreatha Martin, RN, BSN, [facility] [phone] [place], [place] 9:44 AM 10/18/2016

## 2016-10-18 NOTE — Progress Notes (Signed)
Clinical Research Coordinator / Research RN note : This visit for Subject Erin Li with DOB of 1940/10/31 on 10/18/2016 for the above protocol is Visit 7 and is for purpose of research . The consent for this encounter is under Protocol Version Amendment 1 and is currently IRB approved. Subject expressed continued interest and consent in continuing as a study subject. Subject thanked for participation. The purpose for this encounter is to re-consent and complete Visit 7 procedures.   Refer to progress note by Doreatha Martin, RN for re-consent documentation.   All visit procedures were completed per the above mentioned protocol. Refer to the subjects paper source binder for documentation. Drug accountability was performed and the subject returned 3 tablets out of the 64 tablets dispensed. The subject should have returned 4 tablets, but the subject states that she has difficulty removing the tablets at times from the bottle due to the desiccant packs that are in the bottle. Subject states that she may have dropped a tablet due to this, but states that she is sure she did not take an extra dose of the study medication. Subject counseled on instructions for taking the study drug twice daily with a light meal. Subject stated understanding.   In this visit 10/18/2016 the subject will be evaluated by investigator named Brand Males, MD. This research coordinator has verified that the investigator is uptodate with his training logs  Signed by   Bing, [place] Coordinator [facility]  [place], [place] 5:52 PM 10/18/2016

## 2016-10-20 NOTE — Patient Instructions (Signed)
Per protocol 

## 2016-10-30 ENCOUNTER — Telehealth: Payer: Self-pay | Admitting: Internal Medicine

## 2016-10-30 NOTE — Telephone Encounter (Signed)
Erin Li  Lab tests done 10/18/16 for Erin Li  For Erin Li shows elevated high sensitivity CRP 5.5 mg/dL. PReviously normal. . This sometimes can be variation for the day   but sometimes in long run of many years can predict increased risk of heart attack. In her not sure of significance because she has already had cardiac issues. Typically as far as I know the test gets repeated in a few weeks. This she needs to address with  PCP [name], MD or her cardiologist  Please call and let her know  Thanks  Dr. Brand Males, M.D., [facility].C.P Pulmonary and Critical Care Medicine Staff Physician [place] [facility] Pager: [phone], If no answer or between  15:00h - 7:00h: call [phone]  10/30/2016 8:43 AM

## 2016-11-01 ENCOUNTER — Other Ambulatory Visit: Payer: Self-pay | Admitting: Internal Medicine

## 2016-11-01 DIAGNOSIS — Z1231 Encounter for screening mammogram for malignant neoplasm of breast: Secondary | ICD-10-CM

## 2016-11-03 NOTE — Telephone Encounter (Signed)
lmtcb for pt.  

## 2016-11-06 NOTE — Telephone Encounter (Signed)
lmtcb x1 for pt. 

## 2016-11-06 NOTE — Telephone Encounter (Signed)
Patient is returning phone call.  °

## 2016-11-09 NOTE — Telephone Encounter (Signed)
Called and spoke to pt. Informed her of the results and recs per MR. Phone note sent to PCP per pt's request. Pt verbalized understanding and denied any further questions or concerns at this time.

## 2016-11-14 DIAGNOSIS — Z006 Encounter for examination for normal comparison and control in clinical research program: Secondary | ICD-10-CM

## 2016-11-14 DIAGNOSIS — J449 Chronic obstructive pulmonary disease, unspecified: Secondary | ICD-10-CM

## 2016-11-14 NOTE — Progress Notes (Signed)
Title: Randomised, Double-Blind (Sponsor Open), Placebo-Controlled, Multicentre, Dose Ranging Study to Evaluate the Efficacy and Safety of Danirixin Tablets Administered Twice Daily Compared With Placebo for 24 Weeks in Adults With COPD  Sponsor: [employer] , Protoocol Number: [id number], NCT Trial #: [id number]  Synopsis:  Following baseline assessments collected over a 7 day period participants will be randomized (1:1:1:1:1:1) to receive one of five dose strengths of danirixin (5 milligram [mg], 10 mg, 25 mg, 35 mg and 50 mg) or placebo. Study treatment will be administered orally twice daily for 24 weeks. Participants will continue with their standard of care inhaled medications during study. Follow up will continue up to 28 days post last dose  Key Inclusion Criteria: age 76-80, COPD with fev1 >/= 40%, ratio <0.7, >/= 2 copd exacerbations in past 1 year treated with either antibiotics or steroids, but >/= 14 days since last antibiotic or steroid for exacerbation, >/= 10 pack smoking history  Key Exclusion Criteria: non-COPD lung disease, pulse ox < 88% at rest on room air (i.e, restting o2 patients excluded), active phase of pulmonary rehabilitation (maintenance ok), previous lung surgery, QTc prolongation, HIV positive etc..   Key end points: Changes in respiratory symptoms and copd exacerbations and safety of DNX  Key features of DNX:   selective CXC chemokine receptor (CXCR2) antagonist . Positive trends in COPD exacerbation, symptoms and health status  Safety of DNX: As of April 2017 (5 completed Phase 1 studies, and 1 Phase 2A study of moderate copd patients with 45 patients):[name] Most side effects are seen at 100mg  bid (current study max is 50mg  bid)  Phase 1 Normal Adults - dose </= 50mg  bid Study drug -    placebo  General All side effects mild - moderate. No changes in labs, EKG and vitals including PMN per PI meeting slide  x  Any adverse event 30% 13%  Headache 10-20% 0-17%   Fatigue/Lethargy 0-10% 0-3%  Abd Pain 0-11% (20% at 200mg  bid) 0%   Cough 0% 0%  Study withdrawal 0% 0%  SAE 0% 0%   - 3. COPD  COPD patients -data from IB April 2017 and PI meeting Spring 2017 Study drug Placebo  Efficacy  AECOPD and CAT scores better with drug after 1 year x  General No change in PMN, EKG, Vitals, Labs, Spirometry but produces mild to moderate headache and diarrhea  x  Any AE 52-77% 55-80%  Cystitis 5% at 75mg  bid 3%  Backpain 5% at 75mg  bid 0%  Nasopharyngitis 29% at 75mg  bid 33%  Headache 9% at 75mg  bid 8%  Upper abdominal pain 0% at 75mg  bid 6%  Infection rate 42% 50%  SAE 10 events 31 events   ........................................................................................................................................................................ Clinical Research Coordinator / Research RN note : This visit for Subject Erin Li with DOB 11-12-1940 on 11/14/2016 for the above protocol is Visit #8  and is for purpose of research . The consent for this encounter is under Protocol Version Amendment 1 and is currently IRB approved. Subject expressed continued interest and consent in continuing as a study subject. Subject thanked for participation.   All study requirements completed per the above mentioned protocol. Drug accountability performed. Tablets returned equaled 8 tablets, however there should have been 9 tablets returned. Subject states she is positive she did not take an extra tablet and feels she must have dropped a tablet while getting one out of the bottle for that day. Subject re-educated on study medication dosing as one tablet twice a day with a  small meal. Subject stated understanding.  Tablet count in the bottle dispensed (#[id number]) at this visit was verified by this coordinator to contain 64 tablets as stated on the bottle.   Tablet count in returned bottle and correct bottle dispensed was verified by this coordinator and Sub-I Dr.  Vaughan Browner. Refer to the subjects paper study binder for further documentation.   At Visit 7, the lab assessments showed an elevated hsCRP level of 5.5. The subject was advised at the time of results to follow-up with her PCP or cardiologist in regards to this result. The subject has not followed-up at the time of Visit 8. This coordinator spoke with Dr. Chase Caller, PI to clarify if the subject needs to have this lab re-checked at this research visit. Dr. Chase Caller again recommends that the subject follow-up with either PCP or cardiologist in regards to this lab result follow-up.   Subject advised again to follow-up with either provider in regards to this elevated level. Subject states understanding.     Signed by  Erin Li, [place] Coordinator [facility]  [place], [place] 12:15 PM 11/14/2016

## 2016-11-20 ENCOUNTER — Ambulatory Visit
Admission: RE | Admit: 2016-11-20 | Discharge: 2016-11-20 | Disposition: A | Discharge status: Home / Self Care | Payer: Medicare Other | Source: Ambulatory Visit | Attending: Internal Medicine | Admitting: Internal Medicine

## 2016-11-20 DIAGNOSIS — Z1231 Encounter for screening mammogram for malignant neoplasm of breast: Secondary | ICD-10-CM

## 2016-12-01 ENCOUNTER — Telehealth: Payer: Self-pay | Admitting: Internal Medicine

## 2016-12-01 NOTE — Telephone Encounter (Signed)
LATE ENTRY: Clinical Research Coordinator / Research RN note :  I spoke with the subject on 29/Mar/2018 to discuss notices that we were receiving about missed e-diary entries. The subject is currently traveling and does not have WiFi access, but she is completing her diary nightly. I advised the subject to contact me when she returns home so we can make sure her device connects again to her home WiFi. While on the phone the subject mentioned that the device froze up the night prior when she was completing her questionnaires and is still frozen in the same section. I advised the subject to power the device off and leave it off for a minute and then power it back on to see if it will reset it. The subject did this and the device was no longer frozen. I advised the subject to contact our office again if she has any further issues with the device. Subject states understanding.   Signed by  [place] Bing, [place] Coordinator  [facility]  [place], [place] 2:52 PM 12/01/2016

## 2016-12-12 DIAGNOSIS — J449 Chronic obstructive pulmonary disease, unspecified: Secondary | ICD-10-CM

## 2016-12-12 DIAGNOSIS — Z006 Encounter for examination for normal comparison and control in clinical research program: Secondary | ICD-10-CM

## 2016-12-12 NOTE — Progress Notes (Signed)
Title: Randomised, Double-Blind (Sponsor Open), Placebo-Controlled, Multicentre, Dose Ranging Study to Evaluate the Efficacy and Safety of Danirixin Tablets Administered Twice Daily Compared With Placebo for 24 Weeks in Adults With COPD  Sponsor: [employer] , Protoocol Number: [id number], NCT Trial #: [id number]  Synopsis:  Following baseline assessments collected over a 7 day period participants will be randomized (1:1:1:1:1:1) to receive one of five dose strengths of danirixin (5 milligram [mg], 10 mg, 25 mg, 35 mg and 50 mg) or placebo. Study treatment will be administered orally twice daily for 24 weeks. Participants will continue with their standard of care inhaled medications during study. Follow up will continue up to 28 days post last dose  Key Inclusion Criteria: age 1-80, COPD with fev1 >/= 40%, ratio <0.7, >/= 2 copd exacerbations in past 1 year treated with either antibiotics or steroids, but >/= 14 days since last antibiotic or steroid for exacerbation, >/= 10 pack smoking history  Key Exclusion Criteria: non-COPD lung disease, pulse ox < 88% at rest on room air (i.e, restting o2 patients excluded), active phase of pulmonary rehabilitation (maintenance ok), previous lung surgery, QTc prolongation, HIV positive etc..   Key end points: Changes in respiratory symptoms and copd exacerbations and safety of DNX  Key features of DNX:   selective CXC chemokine receptor (CXCR2) antagonist . Positive trends in COPD exacerbation, symptoms and health status  Safety of DNX: As of April 2017 (5 completed Phase 1 studies, and 1 Phase 2A study of moderate copd patients with 45 patients):[name] Most side effects are seen at 100mg  bid (current study max is 50mg  bid)  Phase 1 Normal Adults - dose </= 50mg  bid Study drug -    placebo  General All side effects mild - moderate. No changes in labs, EKG and vitals including PMN per PI meeting slide  x  Any adverse event 30% 13%  Headache 10-20% 0-17%   Fatigue/Lethargy 0-10% 0-3%  Abd Pain 0-11% (20% at 200mg  bid) 0%   Cough 0% 0%  Study withdrawal 0% 0%  SAE 0% 0%   - 3. COPD  COPD patients -data from IB April 2017 and PI meeting Spring 2017 Study drug Placebo  Efficacy  AECOPD and CAT scores better with drug after 1 year x  General No change in PMN, EKG, Vitals, Labs, Spirometry but produces mild to moderate headache and diarrhea  x  Any AE 52-77% 55-80%  Cystitis 5% at 75mg  bid 3%  Backpain 5% at 75mg  bid 0%  Nasopharyngitis 29% at 75mg  bid 33%  Headache 9% at 75mg  bid 8%  Upper abdominal pain 0% at 75mg  bid 6%  Infection rate 42% 50%  SAE 10 events 31 events   ........................................................................................................................................................................ Clinical Research Coordinator / Research RN note : This visit for Subject Erin Li with DOB: [birth year] on 12/12/2016 for the above protocol is Visit # 9  and is for purpose of reserach . The consent for this encounter is under Protocol Version Amendment 1 and is currently IRB approved. Subject expressed continued interest and consent in continuing as a study subject. Subject confirmed that there was no change in contact information (e.g. address, telephone, email). Subject thanked for participation in research and contribution to science.   All required procedures per the above stated protocol were completed. IP dispensed. Subject reminded to continue to take IP with a small meal twice a day. Visit 10 scheduled. Subject reminded to hold all inhaled medications the morning of Visit 10, eat a small meal prior, and avoid  vigorous activity and exercise the morning of visit 10. Subject advised to bring e-diary, propeller device, and IP with her to Visit 10 for return. Subject states understanding. Subject thanked for her participation.   Signed by  [place] Bing, [place] Coordinator   [facility]  [place], [place] 12:03 PM 12/12/2016

## 2016-12-14 DIAGNOSIS — F419 Anxiety disorder, unspecified: Secondary | ICD-10-CM | POA: Diagnosis not present

## 2016-12-14 DIAGNOSIS — I10 Essential (primary) hypertension: Secondary | ICD-10-CM | POA: Diagnosis not present

## 2016-12-14 DIAGNOSIS — E559 Vitamin D deficiency, unspecified: Secondary | ICD-10-CM | POA: Diagnosis not present

## 2016-12-14 DIAGNOSIS — N390 Urinary tract infection, site not specified: Secondary | ICD-10-CM | POA: Diagnosis not present

## 2016-12-14 DIAGNOSIS — M810 Age-related osteoporosis without current pathological fracture: Secondary | ICD-10-CM | POA: Diagnosis not present

## 2016-12-21 DIAGNOSIS — Z0000 Encounter for general adult medical examination without abnormal findings: Secondary | ICD-10-CM | POA: Diagnosis not present

## 2016-12-21 DIAGNOSIS — I10 Essential (primary) hypertension: Secondary | ICD-10-CM | POA: Diagnosis not present

## 2016-12-21 DIAGNOSIS — R7982 Elevated C-reactive protein (CRP): Secondary | ICD-10-CM | POA: Diagnosis not present

## 2017-01-09 ENCOUNTER — Ambulatory Visit: Payer: Medicare Other | Admitting: Internal Medicine

## 2017-01-10 ENCOUNTER — Ambulatory Visit (INDEPENDENT_AMBULATORY_CARE_PROVIDER_SITE_OTHER): Payer: Medicare Other | Admitting: Internal Medicine

## 2017-01-10 VITALS — BP 117/70 | HR 63 | Temp 98.2°F

## 2017-01-10 DIAGNOSIS — J449 Chronic obstructive pulmonary disease, unspecified: Secondary | ICD-10-CM | POA: Diagnosis not present

## 2017-01-10 DIAGNOSIS — Z006 Encounter for examination for normal comparison and control in clinical research program: Secondary | ICD-10-CM | POA: Diagnosis not present

## 2017-01-10 NOTE — Progress Notes (Signed)
Title: Randomised, Double-Blind (Sponsor Open), Placebo-Controlled, Multicentre, Dose Ranging Study to Evaluate the Efficacy and Safety of Danirixin Tablets Administered Twice Daily Compared With Placebo for 24 Weeks in Adults With COPD  Sponsor: [employer] , Protoocol Number: [id number], NCT Trial #: [id number]  Synopsis:  Following baseline assessments collected over a 7 day period participants will be randomized (1:1:1:1:1:1) to receive one of five dose strengths of danirixin (5 milligram [mg], 10 mg, 25 mg, 35 mg and 50 mg) or placebo. Study treatment will be administered orally twice daily for 24 weeks. Participants will continue with their standard of care inhaled medications during study. Follow up will continue up to 28 days post last dose  Key Inclusion Criteria: age 35-80, COPD with fev1 >/= 40%, ratio <0.7, >/= 2 copd exacerbations in past 1 year treated with either antibiotics or steroids, but >/= 14 days since last antibiotic or steroid for exacerbation, >/= 10 pack smoking history  Key Exclusion Criteria: non-COPD lung disease, pulse ox < 88% at rest on room air (i.e, restting o2 patients excluded), active phase of pulmonary rehabilitation (maintenance ok), previous lung surgery, QTc prolongation, HIV positive etc..   Key end points: Changes in respiratory symptoms and copd exacerbations and safety of DNX  Key features of DNX:   selective CXC chemokine receptor (CXCR2) antagonist . Positive trends in COPD exacerbation, symptoms and health status  Safety of DNX: As of April 2017 (5 completed Phase 1 studies, and 1 Phase 2A study of moderate copd patients with 45 patients):[name] Most side effects are seen at 100mg  bid (current study max is 50mg  bid)  Phase 1 Normal Adults - dose </= 50mg  bid Study drug -    placebo  General All side effects mild - moderate. No changes in labs, EKG and vitals including PMN per PI meeting slide  x  Any adverse event 30% 13%  Headache 10-20% 0-17%   Fatigue/Lethargy 0-10% 0-3%  Abd Pain 0-11% (20% at 200mg  bid) 0%   Cough 0% 0%  Study withdrawal 0% 0%  SAE 0% 0%   - 3. COPD  COPD patients -data from IB April 2017 and PI meeting Spring 2017 Study drug Placebo  Efficacy  AECOPD and CAT scores better with drug after 1 year x  General No change in PMN, EKG, Vitals, Labs, Spirometry but produces mild to moderate headache and diarrhea  x  Any AE 52-77% 55-80%  Cystitis 5% at 75mg  bid 3%  Backpain 5% at 75mg  bid 0%  Nasopharyngitis 29% at 75mg  bid 33%  Headache 9% at 75mg  bid 8%  Upper abdominal pain 0% at 75mg  bid 6%  Infection rate 42% 50%  SAE 10 events 31 events   ........................................................................................................................................................................  S: V 10 - day 168 visit per protocol. Subject is here for scheduled visit. She continues to express interest in the research protocol. No adverse events reported no serious adverse events reported. In discussion with the clinical research coordinator patient is compliant with her medications. Con med was reviewed by the research coordinator, aacounting was done.   O Vitals:   01/10/17 1054  BP: 117/70  Pulse: 63  Temp: 98.2 F (36.8 C)  TempSrc: Oral  SpO2: 95%     Recent exam done: Nonfocal exam and as documented in the source document  Labs done -result pending  EKG - no change  Spriro - no change  A   ICD-9-CM ICD-10-CM   1. Research subject V70.7 Z00.6 EKG 12-Lead  2. Chronic obstructive pulmonary disease, unspecified  COPD type ([place]) 496 J44.9 EKG 12-Lead     P Per protocol    Dr. Brand Males, M.D., [facility].C.P Pulmonary and Critical Care Medicine Staff Physician [place] [facility] Pager: [phone], If no answer or between  15:00h - 7:00h: call [phone]  01/10/2017 11:45 AM

## 2017-01-10 NOTE — Patient Instructions (Signed)
ICD-9-CM ICD-10-CM   1. Research subject V70.7 Z00.6 EKG 12-Lead  2. Chronic obstructive pulmonary disease, unspecified COPD type ([place]) 496 J44.9 EKG 12-Lead   V10 / d168 complete for Danrixin study  Plan LAb work done 01/10/2017 - pending result Return per protocol

## 2017-01-10 NOTE — Progress Notes (Signed)
Title: Randomised, Double-Blind (Sponsor Open), Placebo-Controlled, Multicentre, Dose Ranging Study to Evaluate the Efficacy and Safety of Danirixin Tablets Administered Twice Daily Compared With Placebo for 24 Weeks in Adults With COPD  Sponsor: [employer] , Protoocol Number: [id number], NCT Trial #: [id number]  Synopsis:  Following baseline assessments collected over a 7 day period participants will be randomized (1:1:1:1:1:1) to receive one of five dose strengths of danirixin (5 milligram [mg], 10 mg, 25 mg, 35 mg and 50 mg) or placebo. Study treatment will be administered orally twice daily for 24 weeks. Participants will continue with their standard of care inhaled medications during study. Follow up will continue up to 28 days post last dose  Key Inclusion Criteria: age 47-80, COPD with fev1 >/= 40%, ratio <0.7, >/= 2 copd exacerbations in past 1 year treated with either antibiotics or steroids, but >/= 14 days since last antibiotic or steroid for exacerbation, >/= 10 pack smoking history  Key Exclusion Criteria: non-COPD lung disease, pulse ox < 88% at rest on room air (i.e, restting o2 patients excluded), active phase of pulmonary rehabilitation (maintenance ok), previous lung surgery, QTc prolongation, HIV positive etc..   Key end points: Changes in respiratory symptoms and copd exacerbations and safety of DNX  Key features of DNX:   selective CXC chemokine receptor (CXCR2) antagonist . Positive trends in COPD exacerbation, symptoms and health status  Safety of DNX: As of April 2017 (5 completed Phase 1 studies, and 1 Phase 2A study of moderate copd patients with 45 patients):[name] Most side effects are seen at 100mg  bid (current study max is 50mg  bid)  Phase 1 Normal Adults - dose </= 50mg  bid Study drug -    placebo  General All side effects mild - moderate. No changes in labs, EKG and vitals including PMN per PI meeting slide  x  Any adverse event 30% 13%  Headache 10-20% 0-17%   Fatigue/Lethargy 0-10% 0-3%  Abd Pain 0-11% (20% at 200mg  bid) 0%   Cough 0% 0%  Study withdrawal 0% 0%  SAE 0% 0%   - 3. COPD  COPD patients -data from IB April 2017 and PI meeting Spring 2017 Study drug Placebo  Efficacy  AECOPD and CAT scores better with drug after 1 year x  General No change in PMN, EKG, Vitals, Labs, Spirometry but produces mild to moderate headache and diarrhea  x  Any AE 52-77% 55-80%  Cystitis 5% at 75mg  bid 3%  Backpain 5% at 75mg  bid 0%  Nasopharyngitis 29% at 75mg  bid 33%  Headache 9% at 75mg  bid 8%  Upper abdominal pain 0% at 75mg  bid 6%  Infection rate 42% 50%  SAE 10 events 31 events   ........................................................................................................................................................................ Clinical Research Coordinator / Research RN note : This visit for Subject Tayvia Faughnan with DOB: [id number] on 01/10/2017 for the above protocol is Visit/Encounter # 10 and is for purpose of research. The consent for this encounter is under Protocol Version Amendment 1 and  is currently IRB approved. Subject expressed continued interest and consent in continuing as a study subject. Subject confirmed that there was no change in contact information (e.g. address, telephone, email). Subject thanked for participation in research and contribution to science.   Subject originally reported for Visit 10 on 08/May/2018. Visit 10 PRO Questionnaires were completed on 08/May/18. It was then discovered that the subject took her maintenance inhaler morning dose on the day of the visit set for 08/May/18. The visit was stopped at this point, only procedure completed were  the PRO Questionnaires. Subject advised of requirements to hold all inhalers and IP medication the morning of Visit 10 appointment. Visit 10 rescheduled to 09/May/18.   Subject reported for Visit 10 again on 09/May/18. This coordinator verified that the  subject did not take morning dose of inhalers or IP, ok to proceed with visit requirements. All required procedures were completed per the above stated protocol. E-diary, IP, and MDI sensor were all collected from the subject. Refer to the subjects paper source binder for further documentation.    In this visit 01/10/2017 the subject will be evaluated by investigator named Dr. Brand Males . This Research officer, political party has verified that the investigator is uptodate with his/her training logs  Signed by,  [place] Bing, [place] Coordinator [place], [place] 12:52 PM 01/10/2017

## 2017-01-17 ENCOUNTER — Ambulatory Visit: Payer: Medicare Other | Admitting: Internal Medicine

## 2017-01-18 ENCOUNTER — Ambulatory Visit (INDEPENDENT_AMBULATORY_CARE_PROVIDER_SITE_OTHER): Payer: Medicare Other | Admitting: Internal Medicine

## 2017-01-18 DIAGNOSIS — Z006 Encounter for examination for normal comparison and control in clinical research program: Secondary | ICD-10-CM

## 2017-01-18 DIAGNOSIS — J449 Chronic obstructive pulmonary disease, unspecified: Secondary | ICD-10-CM

## 2017-01-18 DIAGNOSIS — R7982 Elevated C-reactive protein (CRP): Secondary | ICD-10-CM | POA: Diagnosis not present

## 2017-01-18 NOTE — Patient Instructions (Addendum)
ICD-9-CM ICD-10-CM   1. Research subject V70.7 Z00.6   2. COPD, moderate ([place]) 496 J44.9   3. Elevated C-reactive protein (CRP) 790.95 R79.82    Thanks for pariticipating in research Lab work 01/18/2017 per protocol Please consult with your PCP [name], MD about elevated CRP since 10/18/16 and ongoing as of 01/10/17 - through course of study; unresolved,. Mild asymptomatic AE  Will let you know of any abnormal results if any from today 01/18/2017 blood work  FOllowup 3 months with Dr Chase Caller with standard of care visit

## 2017-01-18 NOTE — Progress Notes (Signed)
Title: Randomised, Double-Blind (Sponsor Open), Placebo-Controlled, Multicentre, Dose Ranging Study to Evaluate the Efficacy and Safety of Danirixin Tablets Administered Twice Daily Compared With Placebo for 24 Weeks in Adults With COPD  Sponsor: [employer] , Protoocol Number: [id number], NCT Trial #: [id number]  Synopsis:  Following baseline assessments collected over a 7 day period participants will be randomized (1:1:1:1:1:1) to receive one of five dose strengths of danirixin (5 milligram [mg], 10 mg, 25 mg, 35 mg and 50 mg) or placebo. Study treatment will be administered orally twice daily for 24 weeks. Participants will continue with their standard of care inhaled medications during study. Follow up will continue up to 28 days post last dose  Key Inclusion Criteria: age 31-80, COPD with fev1 >/= 40%, ratio <0.7, >/= 2 copd exacerbations in past 1 year treated with either antibiotics or steroids, but >/= 14 days since last antibiotic or steroid for exacerbation, >/= 10 pack smoking history  Key Exclusion Criteria: non-COPD lung disease, pulse ox < 88% at rest on room air (i.e, restting o2 patients excluded), active phase of pulmonary rehabilitation (maintenance ok), previous lung surgery, QTc prolongation, HIV positive etc..   Key end points: Changes in respiratory symptoms and copd exacerbations and safety of DNX  Key features of DNX:   selective CXC chemokine receptor (CXCR2) antagonist . Positive trends in COPD exacerbation, symptoms and health status  Safety of DNX: As of April 2017 (5 completed Phase 1 studies, and 1 Phase 2A study of moderate copd patients with 45 patients):[name] Most side effects are seen at 100mg  bid (current study max is 50mg  bid)  Phase 1 Normal Adults - dose </= 50mg  bid Study drug -    placebo  General All side effects mild - moderate. No changes in labs, EKG and vitals including PMN per PI meeting slide  x  Any adverse event 30% 13%  Headache 10-20% 0-17%   Fatigue/Lethargy 0-10% 0-3%  Abd Pain 0-11% (20% at 200mg  bid) 0%   Cough 0% 0%  Study withdrawal 0% 0%  SAE 0% 0%   - 3. COPD  COPD patients -data from IB April 2017 and PI meeting Spring 2017 Study drug Placebo  Efficacy  AECOPD and CAT scores better with drug after 1 year x  General No change in PMN, EKG, Vitals, Labs, Spirometry but produces mild to moderate headache and diarrhea  x  Any AE 52-77% 55-80%  Cystitis 5% at 75mg  bid 3%  Backpain 5% at 75mg  bid 0%  Nasopharyngitis 29% at 75mg  bid 33%  Headache 9% at 75mg  bid 8%  Upper abdominal pain 0% at 75mg  bid 6%  Infection rate 42% 50%  SAE 10 events 31 events   ........................................................................................................................................................................  S: End of study visit for the above study. Last blinded investigation product intake was 01/09/2017. Last visit with me was 01/10/2017. This is end of study visit. There is no complaints. No adverse events subjectively.[name] Her lab work from the previous visits still shows elevated CRP which we have recorded as an adverse event. Otherwise lab work was not sick clinically significant at last visit. Her lab work has been done. Discussed with research coordinator and she has confirmed the patient has returned MDI sensor and drug accountability is incomplete.     O Vitals not required No EKG required Blood work sent  Exam done - non focal -see paper source     ICD-9-CM ICD-10-CM   1. Research subject V70.7 Z00.6   2. 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 Elevated C-reactive protein (CRP) 790.95 R79.82     Thanks for pariticipating in research Lab work 01/18/2017 per protocol Please consult with your PCP [name], MD about elevated CRP since 10/18/16 and ongoing as of 01/10/17 - through course of study; unresolved,. Mild asymptomatic AE  Will let you know of any abnormal results if any from today  01/18/2017 blood work  FOllowup 3 months with Dr Chase Caller with standard of care visit   Dr. Brand Males, M.D., [facility].C.P Pulmonary and Critical Care Medicine Staff Physician [place] [facility] Pager: [phone], If no answer or between  15:00h - 7:00h: call [phone]  01/18/2017 11:46 AM

## 2017-01-18 NOTE — Progress Notes (Signed)
Title: Randomised, Double-Blind (Sponsor Open), Placebo-Controlled, Multicentre, Dose Ranging Study to Evaluate the Efficacy and Safety of Danirixin Tablets Administered Twice Daily Compared With Placebo for 24 Weeks in Adults With COPD  Sponsor: [employer] , Protoocol Number: [id number], NCT Trial #: [id number]  Synopsis:  Following baseline assessments collected over a 7 day period participants will be randomized (1:1:1:1:1:1) to receive one of five dose strengths of danirixin (5 milligram [mg], 10 mg, 25 mg, 35 mg and 50 mg) or placebo. Study treatment will be administered orally twice daily for 24 weeks. Participants will continue with their standard of care inhaled medications during study. Follow up will continue up to 28 days post last dose  Key Inclusion Criteria: age 70-80, COPD with fev1 >/= 40%, ratio <0.7, >/= 2 copd exacerbations in past 1 year treated with either antibiotics or steroids, but >/= 14 days since last antibiotic or steroid for exacerbation, >/= 10 pack smoking history  Key Exclusion Criteria: non-COPD lung disease, pulse ox < 88% at rest on room air (i.e, restting o2 patients excluded), active phase of pulmonary rehabilitation (maintenance ok), previous lung surgery, QTc prolongation, HIV positive etc..   Key end points: Changes in respiratory symptoms and copd exacerbations and safety of DNX  Key features of DNX:   selective CXC chemokine receptor (CXCR2) antagonist . Positive trends in COPD exacerbation, symptoms and health status  Safety of DNX: As of April 2017 (5 completed Phase 1 studies, and 1 Phase 2A study of moderate copd patients with 45 patients):[name] Most side effects are seen at 100mg  bid (current study max is 50mg  bid)  Phase 1 Normal Adults - dose </= 50mg  bid Study drug -    placebo  General All side effects mild - moderate. No changes in labs, EKG and vitals including PMN per PI meeting slide  x  Any adverse event 30% 13%  Headache 10-20% 0-17%   Fatigue/Lethargy 0-10% 0-3%  Abd Pain 0-11% (20% at 200mg  bid) 0%   Cough 0% 0%  Study withdrawal 0% 0%  SAE 0% 0%   - 3. COPD  COPD patients -data from IB April 2017 and PI meeting Spring 2017 Study drug Placebo  Efficacy  AECOPD and CAT scores better with drug after 1 year x  General No change in PMN, EKG, Vitals, Labs, Spirometry but produces mild to moderate headache and diarrhea  x  Any AE 52-77% 55-80%  Cystitis 5% at 75mg  bid 3%  Backpain 5% at 75mg  bid 0%  Nasopharyngitis 29% at 75mg  bid 33%  Headache 9% at 75mg  bid 8%  Upper abdominal pain 0% at 75mg  bid 6%  Infection rate 42% 50%  SAE 10 events 31 events   ........................................................................................................................................................................  Clinical Research Coordinator / Research RN note : This visit for Subject Erin Li with DOB: [id number] on 01/18/2017 for the above protocol is Visit/Encounter # Follow-up (up to 28 days after last dose)  and is for purpose of research . The consent for this encounter is under Protocol Version Amendment 1 and is currently IRB approved. Subject expressed continued interest and consent in continuing as a study subject. Subject confirmed that there was no change in contact information (e.g. address, telephone, email). Subject thanked for participation in research and contribution to science.   AE/SAE review, conmed review, copd exacerbation review, and lab assessments completed per protocol requirements. Refer to visit note for Dr. Chase Li for documentation of brief physical.   Subject was given a copy of the "Informed consent form-Additional privacy notice  to the informed consent form you signed before taking part in the Danrixin Dose Ranging Study in participants with COPD" to review. Subject reviewed this form at follow-up visit and stated understanding of the contents and did not have any questions.    Subjects lab assessments taken at Visit 10 showed continued elevation of CRP. This AE is considered to be not resolved at this time and has been documented as such in the subjects paper source binder. The subject is advised to follow-up with her PCP Dr. Jani Li to address this further as a SOC issue.    In this visit 01/18/2017 the subject will be evaluated by investigator named Dr. Chase Li . This Research officer, political party has verified that the investigator is uptodate with his/her training logs  Signed by  Erin Li, Erin Li, [place] 12:53 PM 01/18/2017

## 2017-03-02 ENCOUNTER — Ambulatory Visit (INDEPENDENT_AMBULATORY_CARE_PROVIDER_SITE_OTHER)
Admission: RE | Admit: 2017-03-02 | Discharge: 2017-03-02 | Disposition: A | Discharge status: Home / Self Care | Payer: Medicare Other | Source: Ambulatory Visit | Attending: Acute Care | Admitting: Acute Care

## 2017-03-02 DIAGNOSIS — R918 Other nonspecific abnormal finding of lung field: Secondary | ICD-10-CM | POA: Diagnosis not present

## 2017-03-02 DIAGNOSIS — R911 Solitary pulmonary nodule: Secondary | ICD-10-CM | POA: Diagnosis not present

## 2017-03-02 DIAGNOSIS — Z87891 Personal history of nicotine dependence: Secondary | ICD-10-CM

## 2017-03-06 ENCOUNTER — Telehealth: Payer: Self-pay | Admitting: Acute Care

## 2017-03-06 NOTE — Telephone Encounter (Signed)
Dr. 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 Per Dr. Steva Ready reading, this shows progression when compared to previous CT scans. I know you see this patient frequently in the office and that she is in research studies. I thought you would like to know the results of the scan. Thanks so much

## 2017-03-06 NOTE — Telephone Encounter (Signed)
Erin Li  The LDCT says there is possible ILD.. I looked at this in 2015 and there was no ILD but has been nearly 3 years. Please get antoher HRCT and ensure patient has routine visit with me  Dr. Brand Males, M.D., [facility].C.P Pulmonary and Critical Care Medicine Staff Physician [place] [facility] Pager: [phone], If no answer or between  15:00h - 7:00h: call [phone]  03/06/2017 4:00 PM

## 2017-03-08 ENCOUNTER — Other Ambulatory Visit: Payer: Self-pay | Admitting: Acute Care

## 2017-03-08 DIAGNOSIS — Z87891 Personal history of nicotine dependence: Secondary | ICD-10-CM

## 2017-03-09 NOTE — Telephone Encounter (Signed)
lmtcb for pt.  

## 2017-03-12 NOTE — Telephone Encounter (Signed)
lmtcb for pt.  

## 2017-03-14 NOTE — Telephone Encounter (Signed)
lmtcb for pt.  

## 2017-03-16 ENCOUNTER — Telehealth: Payer: Self-pay | Admitting: Internal Medicine

## 2017-03-16 DIAGNOSIS — J849 Interstitial pulmonary disease, unspecified: Secondary | ICD-10-CM

## 2017-03-16 NOTE — Telephone Encounter (Signed)
Pt is aware of MR's recommendations and voiced her understanding. Pt agreed to HRCT. HRCT has been ordered. Pt is scheduled for OV with MR on 04/10/17. Nothing further needed.

## 2017-03-16 NOTE — Telephone Encounter (Signed)
See phone note from today 03/16/17

## 2017-03-30 ENCOUNTER — Telehealth: Payer: Self-pay | Admitting: Internal Medicine

## 2017-03-30 ENCOUNTER — Ambulatory Visit (INDEPENDENT_AMBULATORY_CARE_PROVIDER_SITE_OTHER)
Admission: RE | Admit: 2017-03-30 | Discharge: 2017-03-30 | Disposition: A | Discharge status: Home / Self Care | Payer: Medicare Other | Source: Ambulatory Visit | Attending: Internal Medicine | Admitting: Internal Medicine

## 2017-03-30 DIAGNOSIS — R911 Solitary pulmonary nodule: Secondary | ICD-10-CM | POA: Diagnosis not present

## 2017-03-30 DIAGNOSIS — R05 Cough: Secondary | ICD-10-CM | POA: Diagnosis not present

## 2017-03-30 DIAGNOSIS — J8410 Pulmonary fibrosis, unspecified: Secondary | ICD-10-CM

## 2017-03-30 DIAGNOSIS — J849 Interstitial pulmonary disease, unspecified: Secondary | ICD-10-CM | POA: Diagnosis not present

## 2017-03-30 NOTE — Telephone Encounter (Signed)
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 Last PFT was April 2017. She should definitely get  beore next visit ? 04/10/17  Serum: ESR, ACE, ANA, DS-DNA, RF, anti-CCP, ssA, ssB, scl-70, Total CK,  RNP, Aldolase,  Hypersensitivity Pneumonitis Panel   Pre-bd spiro and dlco only. No lung volume or bd response. No post-bd spiro

## 2017-04-02 NOTE — Telephone Encounter (Signed)
Called and spoke to pt. Informed her of the results and recs per MR. Order placed. Appt for PFt made for 04/03/17. Pt verbalized understanding and denied any further questions or concerns at this time.

## 2017-04-03 ENCOUNTER — Ambulatory Visit (INDEPENDENT_AMBULATORY_CARE_PROVIDER_SITE_OTHER): Payer: Medicare Other | Admitting: Internal Medicine

## 2017-04-03 ENCOUNTER — Other Ambulatory Visit (INDEPENDENT_AMBULATORY_CARE_PROVIDER_SITE_OTHER): Payer: Medicare Other

## 2017-04-03 DIAGNOSIS — J8410 Pulmonary fibrosis, unspecified: Secondary | ICD-10-CM

## 2017-04-03 LAB — PULMONARY FUNCTION TEST
DL/VA % pred: 54 %
DL/VA: 2.59 ml/min/mmHg/L
DLCO cor % pred: 45 %
DLCO cor: 11.14 ml/min/mmHg
DLCO unc % pred: 47 %
DLCO unc: 11.5 ml/min/mmHg
FEF 25-75 Pre: 0.55 L/sec
FEF2575-%Pred-Pre: 32 %
FEV1-%PRED-PRE: 62 %
FEV1-PRE: 1.32 L
FEV1FVC-%Pred-Pre: 80 %
FEV6-%Pred-Pre: 80 %
FEV6-Pre: 2.16 L
FEV6FVC-%PRED-PRE: 103 %
FVC-%PRED-PRE: 77 %
FVC-PRE: 2.2 L
Pre FEV1/FVC ratio: 60 %
Pre FEV6/FVC Ratio: 98 %

## 2017-04-03 LAB — CARDIAC PANEL
CK MB: 2.4 ng/mL (ref 0.3–4.0)
CK TOTAL: 63 U/L (ref 7–177)
RELATIVE INDEX: 3.8 calc — AB (ref 0.0–2.5)

## 2017-04-03 LAB — SEDIMENTATION RATE: SED RATE: 20 mm/h (ref 0–30)

## 2017-04-03 NOTE — Progress Notes (Signed)
PFT done today. 

## 2017-04-04 LAB — ANGIOTENSIN CONVERTING ENZYME: Angiotensin-Converting Enzyme: 67 U/L (ref 9–67)

## 2017-04-04 LAB — ANA: ANA: NEGATIVE

## 2017-04-04 LAB — RHEUMATOID FACTOR

## 2017-04-04 LAB — ANTI-DNA ANTIBODY, DOUBLE-STRANDED: ds DNA Ab: 1 IU/mL

## 2017-04-04 LAB — ANTI-SCLERODERMA ANTIBODY: Scleroderma (Scl-70) (ENA) Antibody, IgG: <1

## 2017-04-04 LAB — CYCLIC CITRUL PEPTIDE ANTIBODY, IGG: Cyclic Citrullin Peptide Ab: <16 Units

## 2017-04-04 LAB — SJOGRENS SYNDROME-A EXTRACTABLE NUCLEAR ANTIBODY: SSA (Ro) (ENA) Antibody, IgG: <1

## 2017-04-04 LAB — SJOGRENS SYNDROME-B EXTRACTABLE NUCLEAR ANTIBODY: SSB (LA) (ENA) ANTIBODY, IGG: NEGATIVE

## 2017-04-05 LAB — ALDOLASE: Aldolase: 3.5 U/L (ref <=8.1)

## 2017-04-06 LAB — HYPERSENSITIVITY PNEUMONITIS
A. FUMIGATUS #1 ABS: NEGATIVE
A. PULLULANS ABS: NEGATIVE
MICROPOLYSPORA FAENI IGG: NEGATIVE
Pigeon Serum Abs: NEGATIVE
THERMOACT. SACCHARII: NEGATIVE
THERMOACTINOMYCES VULGARIS IGG: NEGATIVE

## 2017-04-06 LAB — RNP ANTIBODIES: ENA RNP Ab: 1.4 AI — ABNORMAL HIGH (ref 0.0–0.9)

## 2017-04-10 ENCOUNTER — Encounter: Payer: Self-pay | Admitting: Internal Medicine

## 2017-04-10 ENCOUNTER — Ambulatory Visit (INDEPENDENT_AMBULATORY_CARE_PROVIDER_SITE_OTHER): Payer: Medicare Other | Admitting: Internal Medicine

## 2017-04-10 VITALS — BP 122/76 | HR 68 | Ht 65.0 in | Wt 144.0 lb

## 2017-04-10 DIAGNOSIS — I2510 Atherosclerotic heart disease of native coronary artery without angina pectoris: Secondary | ICD-10-CM

## 2017-04-10 DIAGNOSIS — J849 Interstitial pulmonary disease, unspecified: Secondary | ICD-10-CM

## 2017-04-10 NOTE — Progress Notes (Signed)
Subjective:     Patient ID: Erin Li, female   DOB: October 01, 1940, 76 y.o.   MRN: [id number]  HPI     OV 04/10/2017   Chief Complaint  Patient presents with  . Follow-up    Pt states her breathing is unchanged since last OV. Pt states she has mild non prod cough. Pt denies CP/tightness, f/c/s, and chest congestion.    Follow-up COPD but now has concerned that she has developed interstitial lung disease  She is here with her husband. Back in 2015 a high-resolution CT chest because I thought I heard crackles and thoracic radiology ruled out interstitial lung disease. In the interim she's had lung cancer screening CT chest at overall stability in her pulmonary function test. But there was concern because of crackles that she might have interstitial lung disease so we did a high-resolution CT chest and July 2018 and thoracic radiologist reported as definite precedence of ILD although it is not a UIP pattern. [name] She does say that her son and over the lung transplant in [place] 8 years ago at age of 19 because of IPF. She did have autoimmune panel that is only trace positive for ENP . 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 She denies any humidifier or mildew or mold in the house. The CT chest also shows coronary artery calcification. Of note I personally visualized the CT chest.  ACCP ILD questiin  - symptioms: occ cough x 2 years with some night cough and mild phleogn ona nd off. Very mild non troubling dyspnea x 3 years - pasth medical : GERD +, mild dysphagia + - personal exposure x -: +ver for remote use of rec / street drugs. Cig smoked age 89 though 64 at 41 cigs/dat - fam hx: COPD +, son with transplant for ?  IPF at age 58 - home exposurE: does have hot tube - uses it once a month. Has a [place] bird x > 20 years - residence: [place], [place], [place], [place],  [place] - occupatin: bookeeper - pulmotiox hx: negagive  Results for Erin Li (MRN [id number]) as of 04/10/2017 11:21  Ref. Range 04/03/2017 10:18  CK Total Latest Ref Range: 7 - 177 U/L 63  CK-MB Latest Ref Range: 0.3 - 4.0 ng/mL 2.4  Aldolase Latest Ref Range: <=8.1 U/L 3.5  Sed Rate Latest Ref Range: 0 - 30 mm/hr 20  Anit Nuclear Antibody(ANA) Latest Ref Range: NEGATIVE  NEG  Angiotensin-Converting Enzyme Latest Ref Range: 9 - 67 U/L 67  Cyclic Citrullin Peptide Ab Latest Units: Units <16  ds DNA Ab Latest Units: IU/mL 1  ENA RNP Ab Latest Ref Range: 0.0 - 0.9 AI 1.4 (H)  RA Latex Turbid. Latest Ref Range: <14 IU/mL <14  SSA (Ro) (ENA) Antibody, IgG Latest Ref Range: <1.0 NEG AI  <1.0 NEG  SSB (La) (ENA) Antibody, IgG Latest Ref Range: <1.0 NEG AI  <1.0 NEG  Scleroderma (Scl-70) (ENA) Antibody, IgG Latest Ref Range: <1.0 NEG AI  <1.0 NEG     Results for Erin Li (MRN [id number]) as of 04/10/2017 10:55  Ref. Range 08/07/2014 13:57 11/20/2014 09:00 12/03/2014 07:57 01/07/2015 10:21 04/07/2015 11:21 07/09/2015 08:47 10/01/2015 08:25 12/07/2015 08:42 04/03/2017 09:51  FVC-Pre Latest Units: L 1.99 2.22 2.11 2.20 2.25 2.26 2.35 2.38 2.20  FVC-%Pred-Pre Latest Units: % 67 75 72  75 77 78 81 82 77   Results for Erin Li (MRN [id number]) as of 04/10/2017 10:55  Ref. Range 08/07/2014 13:57 11/20/2014 09:00 12/03/2014 07:57 01/07/2015 10:21 04/07/2015 11:21 07/09/2015 08:47 10/01/2015 08:25 12/07/2015 08:42 04/03/2017 09:51  FEV1-Pre Latest Units: L 1.04 1.20 1.28 1.29 1.31 1.35 1.41 1.54 1.32  FEV1-%Pred-Pre Latest Units: % 47 54 57 58 59 62 64 71 62   Results for Erin Li (MRN [id number]) as of 04/10/2017 10:55  Ref. Range 08/07/2014 13:57 11/20/2014 09:00 12/03/2014 07:57 01/07/2015 10:21 04/07/2015 11:21 07/09/2015 08:47 10/01/2015 08:25 12/07/2015 08:42 04/03/2017 09:51  DLCO unc Latest Units: ml/min/mmHg 14.86        11.50  DLCO unc % pred Latest Units: % 61        47    Results for Erin Li (MRN [id number]) as of 04/10/2017 10:55  Ref. Range 08/07/2014 13:57 11/20/2014 09:00 12/03/2014 07:57 01/07/2015 10:21 04/07/2015 11:21 07/09/2015 08:47 10/01/2015 08:25 12/07/2015 08:42 04/03/2017 09:51  Pre FEV1/FVC ratio Latest Units: % 53 54 61 59 58 60 60 65 60  FEV1FVC-%Pred-Pre Latest Units: % 69 72 80 78 77 79 79 86 80      has a past medical history of Coronary artery disease; Diverticulosis; HTN (hypertension); and Lung nodule.   reports that she quit smoking about 5 years ago. Her smoking use included Cigarettes. She has a 37.50 pack-year smoking history. She has never used smokeless tobacco.  Past Surgical History:  Procedure Laterality Date  . CARDIAC CATHETERIZATION    . CORONARY ANGIOPLASTY    . TONSILLECTOMY AND ADENOIDECTOMY      No Known Allergies  Immunization History  Administered Date(s) Administered  . Influenza Split 06/04/2013, 05/05/2014  . Influenza Whole 05/28/2012  . Influenza, High Dose Seasonal PF 06/05/2016  . Influenza,inj,Quad PF,36+ Mos 06/05/2015  . Pneumococcal Conjugate-13 01/26/2015  . Pneumococcal Polysaccharide-23 09/04/2005, 09/24/2012  . Tdap 03/20/2012    Family History  Problem Relation Age of Onset  . Heart attack Father   . Kidney disease Father   . COPD Mother   . Lung cancer Brother 82  . Pulmonary fibrosis Son        had lung transplant     Current Outpatient Prescriptions:  .  albuterol (PROVENTIL HFA;VENTOLIN HFA) 108 (90 BASE) MCG/ACT inhaler, Inhale 2 puffs into the lungs every 6 (six) hours as needed., Disp: , Rfl:  .  Ascorbic Acid (VITAMIN C) 1000 MG tablet, Take 1,000 mg by mouth daily., Disp: , Rfl:  .  b complex vitamins tablet, Take 1 tablet by mouth daily., Disp: , Rfl:  .  Biotin 1 MG CAPS, Take by mouth daily., Disp: , Rfl:  .  calcium-vitamin D 250-100 MG-UNIT per tablet, Take 1 tablet by mouth daily. , Disp: , Rfl:  .  diltiazem (CARDIZEM) 60 MG tablet, Take 60 mg by mouth 3 (three) times daily., Disp: , Rfl:   .  fish oil-omega-3 fatty acids 1000 MG capsule, Take 2 g by mouth daily., Disp: , Rfl:  .  fluticasone furoate-vilanterol (BREO ELLIPTA) 100-25 MCG/INH AEPB, Inhale 1 puff into the lungs daily., Disp: 90 each, Rfl: 3 .  glucosamine-chondroitin 500-400 MG tablet, Take 1 tablet by mouth every morning., Disp: , Rfl:  .  magnesium 30 MG tablet, Take 30 mg by mouth 2 (two) times daily. 1 tabs daily (dose unknown), Disp: , Rfl:  .  metoprolol succinate (TOPROL-XL) 50 MG 24 hr tablet, Take 50 mg by mouth daily. Take with or immediately  following a meal., Disp: , Rfl:  .  selenium 50 MCG TABS, Take 50 mcg by mouth daily., Disp: , Rfl:  .  senna (SENOKOT) 8.6 MG tablet, Take 1 tablet by mouth daily., Disp: , Rfl:  .  vitamin B-12 (CYANOCOBALAMIN) 1000 MCG tablet, Take 1,000 mcg by mouth daily., Disp: , Rfl:  .  vitamin E 200 UNIT capsule, Take 200 Units by mouth daily., Disp: , Rfl:    Review of Systems     Objective:   Physical Exam  Constitutional: She is oriented to person, place, and time. She appears well-developed and well-nourished. No distress.  HENT:  Head: Normocephalic and atraumatic.  Right Ear: External ear normal.  Left Ear: External ear normal.  Mouth/Throat: Oropharynx is clear and moist. No oropharyngeal exudate.  Eyes: Pupils are equal, round, and reactive to light. Conjunctivae and EOM are normal. Right eye exhibits no discharge. Left eye exhibits no discharge. No scleral icterus.  Neck: Normal range of motion. Neck supple. No JVD present. No tracheal deviation present. No thyromegaly present.  Cardiovascular: Normal rate, regular rhythm, normal heart sounds and intact distal pulses.  Exam reveals no gallop and no friction rub.   No murmur heard. Pulmonary/Chest: Effort normal. No respiratory distress. She has no wheezes. She has rales. She exhibits no tenderness.  Left basal greater than right basal crackles  Abdominal: Soft. Bowel sounds are normal. She exhibits no  distension and no mass. There is no tenderness. There is no rebound and no guarding.  Musculoskeletal: Normal range of motion. She exhibits no edema or tenderness.  Lymphadenopathy:    She has no cervical adenopathy.  Neurological: She is alert and oriented to person, place, and time. She has normal reflexes. No cranial nerve deficit. She exhibits normal muscle tone. Coordination normal.  Skin: Skin is warm and dry. No rash noted. She is not diaphoretic. No erythema. No pallor.  Psychiatric: She has a normal mood and affect. Her behavior is normal. Judgment and thought content normal.  Vitals reviewed.  Vitals:   04/10/17 1046  BP: 122/76  Pulse: 68  SpO2: 97%  Weight: 144 lb (65.3 kg)  Height: 5\' 5"  (1.651 m)   Estimated body mass index is 23.96 kg/m as calculated from the following:   Height as of this encounter: 5\' 5"  (1.651 m).   Weight as of this encounter: 144 lb (65.3 kg).     Assessment:       ICD-10-CM   1. ILD (interstitial lung disease) ([place]) J84.9   2. Coronary artery calcification seen on CT scan I25.10 Ambulatory referral to Cardiology       Plan:     ILD There is concern that since 2015 you have developed interstitial lung disease on high resolution CT chest  The cause of this is currently unknown  You're autoimmune antibody tests were negative except for slightly elevated enp level that I think is nonspecific  Interstitial lung disease might have bearing with his son's diagnosis of IPF  On the other hand the presence of bird at home might have a bearing  Plan - I'm going to discuss with the radiologist about this can pattern between 2015 in 2018 - I will get back to you about this - Based on this we will see you in several months with another repeat breathing test or refer you to surgical lung biopsy -  need information about son's ILD and then we can consider referral to genetics  -2. Coronary artery calcification -  Referred to cardiologist eithr  [facility] or Dr Einar Gip    > 50% of this > 25 min visit spent in face to face counseling or coordination of care      Dr. Brand Males, M.D., [facility].C.P Pulmonary and Critical Care Medicine Staff Physician [place] [facility] Pager: [phone], If no answer or between  15:00h - 7:00h: call [phone]  04/10/2017 11:28 AM

## 2017-04-10 NOTE — Patient Instructions (Addendum)
ICD-10-CM   1. ILD (interstitial lung disease) ([place]) J84.9    There is concern that since 2015 you have developed interstitial lung disease on high resolution CT chest  The cause of this is currently unknown  You're autoimmune antibody tests were negative except for slightly elevated enp level that I think is nonspecific  Interstitial lung disease might have bearing with his son's diagnosis of IPF  On the other hand the presence of bird at home might have a bearing  Plan - I'm going to discuss with the radiologist about this can pattern between 2015 in 2018 - I will get back to you about this - Based on this we will see you in several months with another repeat breathing test or refer you to surgical lung biopsy - need information about son's ILD and then we can consider referral to genetics  -2. Coronary artery calcification - Referred to cardiologist eithr [facility] or Dr Einar Gip

## 2017-04-17 ENCOUNTER — Encounter (HOSPITAL_COMMUNITY): Payer: Self-pay | Admitting: Emergency Medicine

## 2017-04-17 ENCOUNTER — Emergency Department (HOSPITAL_COMMUNITY)
Admission: EM | Admit: 2017-04-17 | Discharge: 2017-04-17 | Disposition: A | Discharge status: Home / Self Care | Payer: Medicare Other | Attending: Emergency Medicine | Admitting: Emergency Medicine

## 2017-04-17 DIAGNOSIS — H9201 Otalgia, right ear: Secondary | ICD-10-CM | POA: Diagnosis not present

## 2017-04-17 DIAGNOSIS — Z5321 Procedure and treatment not carried out due to patient leaving prior to being seen by health care provider: Secondary | ICD-10-CM | POA: Diagnosis not present

## 2017-04-17 NOTE — ED Triage Notes (Signed)
Pt reports that she was taking out hearing aids and part of the hearing aid did not come out of right ear. Pt states it occurred around 0000

## 2017-04-17 NOTE — ED Notes (Signed)
No foreign body seen in ear at time of triage. Pt states that she will follow up with PCP as needed.

## 2017-05-09 ENCOUNTER — Telehealth: Payer: Self-pay | Admitting: Internal Medicine

## 2017-05-09 DIAGNOSIS — J449 Chronic obstructive pulmonary disease, unspecified: Secondary | ICD-10-CM

## 2017-05-09 NOTE — Telephone Encounter (Signed)
lmtcb X1 for pt. Routing to elise's box for follow-up.

## 2017-05-09 NOTE — Telephone Encounter (Signed)
Please have Erin Li visit between halloween and thanksgiving with Pre-bd spiro and dlco only. No lung volume or bd response. No post-bd spiro    Let them know radiolgist 2nd opinon that there is definite ild now and depending on the pft at next visit will discuss biopsy  IF she is worse than prior visit, need to come in sooner  THanks  Dr. Brand Males, M.D., [facility].C.P Pulmonary and Critical Care Medicine Staff Physician [place] [facility] Pager: [phone], If no answer or between  15:00h - 7:00h: call [phone]  05/09/2017 5:15 PM

## 2017-05-10 DIAGNOSIS — J432 Centrilobular emphysema: Secondary | ICD-10-CM | POA: Diagnosis not present

## 2017-05-10 DIAGNOSIS — Z0189 Encounter for other specified special examinations: Secondary | ICD-10-CM | POA: Diagnosis not present

## 2017-05-10 DIAGNOSIS — J849 Interstitial pulmonary disease, unspecified: Secondary | ICD-10-CM | POA: Diagnosis not present

## 2017-05-10 DIAGNOSIS — I2510 Atherosclerotic heart disease of native coronary artery without angina pectoris: Secondary | ICD-10-CM | POA: Diagnosis not present

## 2017-05-10 NOTE — Telephone Encounter (Signed)
Patient is returning phone call.  °

## 2017-05-10 NOTE — Telephone Encounter (Signed)
Pt is aware of MR's recommendations and voiced her understanding. PFT and OV has been scheduled. Nothing further needed.

## 2017-05-15 DIAGNOSIS — Z23 Encounter for immunization: Secondary | ICD-10-CM | POA: Diagnosis not present

## 2017-05-18 DIAGNOSIS — E0789 Other specified disorders of thyroid: Secondary | ICD-10-CM | POA: Diagnosis not present

## 2017-05-18 DIAGNOSIS — R0602 Shortness of breath: Secondary | ICD-10-CM | POA: Diagnosis not present

## 2017-05-18 DIAGNOSIS — I2510 Atherosclerotic heart disease of native coronary artery without angina pectoris: Secondary | ICD-10-CM | POA: Diagnosis not present

## 2017-05-30 ENCOUNTER — Telehealth: Payer: Self-pay | Admitting: Internal Medicine

## 2017-05-30 NOTE — Telephone Encounter (Signed)
Nuclear medicine stress test with Dr Langston Reusing 05/18/17 - low risk  Dr. Brand Males, M.D., [facility].C.P Pulmonary and Critical Care Medicine Staff Physician [place] [facility] Pager: [phone], If no answer or between  15:00h - 7:00h: call [phone]  05/30/2017 7:50 PM

## 2017-06-07 DIAGNOSIS — I2510 Atherosclerotic heart disease of native coronary artery without angina pectoris: Secondary | ICD-10-CM | POA: Diagnosis not present

## 2017-06-19 ENCOUNTER — Telehealth: Payer: Self-pay | Admitting: Internal Medicine

## 2017-06-19 NOTE — Telephone Encounter (Signed)
Title: Randomised, Double-Blind (Sponsor Open), Placebo-Controlled, Multicentre, Dose Ranging Study to Evaluate the Efficacy and Safety of Danirixin Tablets Administered Twice Daily Compared With Placebo for 24 Weeks in Adults With COPD  Sponsor: [employer] , Protoocol Number: [id number], NCT Trial #: [id number]  ///////////////// I Erin Li Apr 05, 1941 to  Give status update - [place] and Advisory Letter dated 06/13/2017. I wanted to discus interim results of above study with subject. Wanted to explain no benefit with IP but also increased pneumonia risk, musculoskeletal AE and headadaches seen with IP in the above study. It is currently not known what patient received - placebo v  IP dosage. Subject thankful for updated. Thanked subject for study participation. I was her treating pulmonologist and have informed myself. In addition,  note sent to PCP  [name], MD as required by the safety letter. No action required. Subject did not have pneumonia during the study. She is no longer a study participant. [employer] has cancelled drug development   HAd to leave message to call back - [phone] - to discuss above information   Dr. Brand Males, M.D., [facility].C.P Pulmonary and Critical Care Medicine Staff Physician [place] [facility] Pager: [phone], If no answer or between  15:00h - 7:00h: call [phone]  06/19/2017 11:10 AM

## 2017-06-20 NOTE — Telephone Encounter (Signed)
Subject returned Dr. Golden Pop call to the research office and this coordinator spoke with the subject. Subject was advised that we received information that the study showed no benefit and also increased risk of pneumonia, musculoskeletal AE and headaches. Subject advised that it is not known of she received IP or placebo as this time. Subject stated understanding and verbalized no questions at this time.   Lockhart Bing, [place] [address] [place], [place]

## 2017-06-21 DIAGNOSIS — I2510 Atherosclerotic heart disease of native coronary artery without angina pectoris: Secondary | ICD-10-CM | POA: Diagnosis not present

## 2017-06-21 DIAGNOSIS — Z0189 Encounter for other specified special examinations: Secondary | ICD-10-CM | POA: Diagnosis not present

## 2017-06-21 DIAGNOSIS — J849 Interstitial pulmonary disease, unspecified: Secondary | ICD-10-CM | POA: Diagnosis not present

## 2017-06-21 DIAGNOSIS — J432 Centrilobular emphysema: Secondary | ICD-10-CM | POA: Diagnosis not present

## 2017-06-28 DIAGNOSIS — H353131 Nonexudative age-related macular degeneration, bilateral, early dry stage: Secondary | ICD-10-CM | POA: Diagnosis not present

## 2017-06-28 DIAGNOSIS — D3132 Benign neoplasm of left choroid: Secondary | ICD-10-CM | POA: Diagnosis not present

## 2017-06-28 DIAGNOSIS — H35373 Puckering of macula, bilateral: Secondary | ICD-10-CM | POA: Diagnosis not present

## 2017-07-04 ENCOUNTER — Emergency Department (HOSPITAL_COMMUNITY)
Admission: EM | Admit: 2017-07-04 | Discharge: 2017-07-04 | Disposition: A | Discharge status: Home / Self Care | Payer: Medicare Other | Attending: Emergency Medicine | Admitting: Emergency Medicine

## 2017-07-04 ENCOUNTER — Emergency Department (HOSPITAL_COMMUNITY): Payer: Medicare Other

## 2017-07-04 ENCOUNTER — Encounter (HOSPITAL_COMMUNITY): Payer: Self-pay | Admitting: Emergency Medicine

## 2017-07-04 DIAGNOSIS — Y93E1 Activity, personal bathing and showering: Secondary | ICD-10-CM | POA: Diagnosis not present

## 2017-07-04 DIAGNOSIS — Z79899 Other long term (current) drug therapy: Secondary | ICD-10-CM | POA: Insufficient documentation

## 2017-07-04 DIAGNOSIS — S99911A Unspecified injury of right ankle, initial encounter: Secondary | ICD-10-CM | POA: Diagnosis present

## 2017-07-04 DIAGNOSIS — X500XXA Overexertion from strenuous movement or load, initial encounter: Secondary | ICD-10-CM | POA: Insufficient documentation

## 2017-07-04 DIAGNOSIS — Y92002 Bathroom of unspecified non-institutional (private) residence single-family (private) house as the place of occurrence of the external cause: Secondary | ICD-10-CM | POA: Insufficient documentation

## 2017-07-04 DIAGNOSIS — I10 Essential (primary) hypertension: Secondary | ICD-10-CM | POA: Diagnosis not present

## 2017-07-04 DIAGNOSIS — J449 Chronic obstructive pulmonary disease, unspecified: Secondary | ICD-10-CM | POA: Diagnosis not present

## 2017-07-04 DIAGNOSIS — M79671 Pain in right foot: Secondary | ICD-10-CM | POA: Diagnosis not present

## 2017-07-04 DIAGNOSIS — Y999 Unspecified external cause status: Secondary | ICD-10-CM | POA: Insufficient documentation

## 2017-07-04 DIAGNOSIS — Z87891 Personal history of nicotine dependence: Secondary | ICD-10-CM | POA: Diagnosis not present

## 2017-07-04 DIAGNOSIS — S93401A Sprain of unspecified ligament of right ankle, initial encounter: Secondary | ICD-10-CM

## 2017-07-04 DIAGNOSIS — M25571 Pain in right ankle and joints of right foot: Secondary | ICD-10-CM | POA: Diagnosis not present

## 2017-07-04 MED ORDER — TRAMADOL HCL 50 MG PO TABS: 50.0000 mg | ORAL_TABLET | Freq: Four times a day (QID) | ORAL | 0 refills | Status: DC | PRN

## 2017-07-04 NOTE — Discharge Instructions (Signed)
Your x-ray showed no signs of fracture.  This is likely a sprain. Wear the splint for comfort. Use the crutches for weightbearing as tolerated.  Please rest, ice, compress and elevated the affected body part to help with swelling and pain.  May take motrin/ibuprofen and tylenol for pain. Have given you a short course of tramadol to take a night for pain that is not relieved by nsaids.  This medication will make you drowsy to avoid driving or placing or self-harm. Follow up with ortho if symptoms are not improving. Follow up with primary doctor. Please return to the ED if your symptoms worsen.

## 2017-07-04 NOTE — ED Provider Notes (Signed)
[facility] DEPT Provider Note   CSN: [id number] Arrival date & time: 07/04/17  1059     History   Chief Complaint Chief Complaint  Patient presents with  . Ankle Pain    HPI Erin Li is a 76 y.o. female.  HPI 76 year old Caucasian female presents to the ED with complaints of right ankle pain that has persisted since twisting her ankle 5 days ago.  Patient states that she was trying to get out of the shower when she twisted her right ankle and fell causing injury to her right ankle.  Patient states that she used her friend's crutches that did help however she stopped using them yesterday and the swelling returned.  Patient has been trying ibuprofen at home with some relief.  States the pain is worse with ambulation and palpation.  Patient denies head injury or LOC.  States pain is localized to the right ankle.  Some bruising over the right tib-fib.  Denies any associated paresthesias, weakness, open wound. Past Medical History:  Diagnosis Date  . Coronary artery disease   . Diverticulosis   . HTN (hypertension)   . Lung nodule     Patient Active Problem List   Diagnosis Date Noted  . Elevated C-reactive protein (CRP) 01/18/2017  . Research subject 11/20/2014  . COPD, moderate ([place]) 08/07/2014  . Cough 01/13/2013  . COPD exacerbation ([place]) 12/11/2012  . COPD (chronic obstructive pulmonary disease) ([place]) 09/25/2012  . Lung nodule 07/14/2012  . Hx of smoking 07/14/2012    Past Surgical History:  Procedure Laterality Date  . CARDIAC CATHETERIZATION    . CORONARY ANGIOPLASTY    . TONSILLECTOMY AND ADENOIDECTOMY      OB History    No data available       Home Medications    Prior to Admission medications   Medication Sig Start Date End Date Taking? Authorizing Provider  albuterol (PROVENTIL HFA;VENTOLIN HFA) 108 (90 BASE) MCG/ACT inhaler Inhale 2 puffs into the lungs every 6 (six) hours as needed.   Yes [provider]  Ascorbic Acid (VITAMIN C) 1000 MG tablet Take 1,000 mg by mouth daily.   Yes [provider]  b complex vitamins tablet Take 1 tablet by mouth daily.   Yes [provider]  Biotin 1 MG CAPS Take by mouth daily.   Yes [provider]  calcium-vitamin D 250-100 MG-UNIT per tablet Take 1 tablet by mouth daily.    Yes [provider]  diltiazem (CARDIZEM) 60 MG tablet Take 60 mg by mouth 3 (three) times daily.   Yes [provider]  fish oil-omega-3 fatty acids 1000 MG capsule Take 2 g by mouth daily.   Yes [provider]  fluticasone furoate-vilanterol (BREO ELLIPTA) 100-25 MCG/INH AEPB Inhale 1 puff into the lungs daily. 10/18/16  Yes Brand Males, MD  glucosamine-chondroitin 500-400 MG tablet Take 1 tablet by mouth every morning.   Yes [provider]  ibuprofen (ADVIL,MOTRIN) 200 MG tablet Take 400 mg by mouth 2 (two) times daily.   Yes [provider]  indomethacin (INDOCIN) 50 MG capsule Take 50 mg by mouth daily as needed.   Yes [provider]  magnesium 30 MG tablet Take 30 mg by mouth 2 (two) times daily. 1 tabs daily (dose unknown)   Yes [provider]  selenium 50 MCG TABS Take 50 mcg by mouth daily.   Yes [provider]  senna (SENOKOT) 8.6 MG tablet Take 1 tablet by  mouth daily.   Yes [provider]  vitamin B-12 (CYANOCOBALAMIN) 1000 MCG tablet Take 1,000 mcg by mouth daily.   Yes [provider]  vitamin E 200 UNIT capsule Take 200 Units by mouth daily.   Yes [provider]  metoprolol succinate (TOPROL-XL) 50 MG 24 hr tablet Take 50 mg by mouth daily. Take with or immediately following a meal.    [provider]  traMADol (ULTRAM) 50 MG tablet Take 1 tablet (50 mg total) by mouth every 6 (six) hours as needed. 07/04/17   Doristine Devoid, PA-C    Family History Family History  Problem Relation Age of Onset  . Heart attack Father     . Kidney disease Father   . COPD Mother   . Lung cancer Brother 18  . Pulmonary fibrosis Son        had lung transplant    Social History Social History  Substance Use Topics  . Smoking status: Former Smoker    Packs/day: 0.75    Years: 50.00    Types: Cigarettes    Quit date: 11/11/2011  . Smokeless tobacco: Never Used     Comment: Encouraged to remain smoke free  . Alcohol use Yes     Allergies   Patient has no known allergies.   Review of Systems Review of Systems  Musculoskeletal: Positive for arthralgias, joint swelling and myalgias. Negative for back pain, gait problem, neck pain and neck stiffness.  Skin: Positive for color change. Negative for wound.  Neurological: Negative for weakness and numbness.     Physical Exam Updated Vital Signs BP 135/88 (BP Location: Left Arm)   Pulse 80   Temp 98.6 F (37 C) (Oral)   Resp 16   LMP 09/04/1988 (Approximate)   SpO2 98%   Physical Exam  Constitutional: She appears well-developed and well-nourished. No distress.  HENT:  Head: Normocephalic and atraumatic.  Eyes: Right eye exhibits no discharge. Left eye exhibits no discharge. No scleral icterus.  Neck: Normal range of motion.  Cardiovascular: Intact distal pulses.   Pulmonary/Chest: No respiratory distress.  Musculoskeletal: Normal range of motion.  Patient with edema and tenderness with palpation over the lateral and medial malleolus of the right ankle.  Patient has full range of motion without any pain.  No midfoot tenderness.  Brisk cap refill.  Sensation intact.  DP pulses are 2+ bilaterally.  She does have some mild ecchymosis over the distal anterior tib-fib.  No pain to palpation of this area.  Full range of motion of the right knee without any pain.  Full range of motion of the right hip without any pain.  Patient ambulatory with pain over the right ankle.  Neurological: She is alert.  Skin: Skin is dry. Capillary refill takes less than 2 seconds. No  pallor.  Psychiatric: Her behavior is normal. Judgment and thought content normal.  Nursing note and vitals reviewed.    ED Treatments / Results  Labs (all labs ordered are listed, but only abnormal results are displayed) Labs Reviewed - No data to display  EKG  EKG Interpretation None       Radiology Dg Tibia/fibula Right  Result Date: 07/04/2017 CLINICAL DATA:  76 year old female status post fall with pain radiating from the distal fibula to the foot. EXAM: RIGHT TIBIA AND FIBULA - 2 VIEW COMPARISON:  Foot an ankle series today reported separately. FINDINGS: The distal femur, proximal tibia and fibula appear intact. Alignment at the right knee appears preserved.  There is medial compartment joint space loss. The mid and distal right fibula and tibia appear intact. Alignment is preserved at the right ankle. Calcaneus appears intact. IMPRESSION: No acute fracture or dislocation identified about the right tib-fib. Electronically Signed   By: Genevie Ann M.D.   On: 07/04/2017 15:33   Dg Ankle Complete Right  Result Date: 07/04/2017 CLINICAL DATA:  Right ankle pain and swelling for 9 days. Fell in bathroom at home. EXAM: RIGHT ANKLE - COMPLETE 3+ VIEW COMPARISON:  None. FINDINGS: The ankle mortise is maintained. No acute ankle fracture. No osteochondral lesion. The mid and hindfoot bony structures are intact. The joint spaces are maintained. IMPRESSION: No acute fracture. Electronically Signed   By: Marijo Sanes M.D.   On: 07/04/2017 12:00   Dg Foot Complete Right  Result Date: 07/04/2017 CLINICAL DATA:  Rolled right ankle and fell with pain over the distal fibula radiating into the lateral aspect of the foot. EXAM: RIGHT FOOT COMPLETE - 3+ VIEW COMPARISON:  Right ankle series of today's date FINDINGS: The bones are subjectively adequately mineralized. There is hallux valgus contour of the first ray with mild degenerative narrowing of the first MTP joint. The phalanges, metatarsals, and  tarsal bones exhibit no acute abnormalities. There is mild soft tissue swelling about the ankle. No fracture of the ankle is observed on this study. 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 No acute fracture or dislocation of the bones of the right foot. Electronically Signed   By: David  Martinique M.D.   On: 07/04/2017 15:32    Procedures Procedures (including critical care time)  Medications Ordered in ED Medications - No data to display   Initial Impression / Assessment and Plan / ED Course  I have reviewed the triage vital signs and the nursing notes.  Pertinent labs & imaging results that were available during my care of the patient were reviewed by me and considered in my medical decision making (see chart for details).     Patient presents to the ED with complaints of right ankle pain after a fall 5 days ago.  States the pain and swelling was improving while she was using the crutches however started back walking yesterday and the swelling returned.  Denies any head injury or LOC.  Patient is neurovascularly intact.  Patient X-Ray negative for obvious fracture or dislocation. Pain managed in ED. Pt advised to follow up with orthopedics if symptoms persist for possibility of missed fracture diagnosis. Patient given brace while in ED, conservative therapy recommended and discussed. Patient will be dc home & is agreeable with above plan. Dicussed with Dr. Billy Fischer who is agreeable with the above plan.    Final Clinical Impressions(s) / ED Diagnoses   Final diagnoses:  Sprain of right ankle, unspecified ligament, initial encounter    New Prescriptions Discharge Medication List as of 07/04/2017  3:59 PM    START taking these medications   Details  traMADol (ULTRAM) 50 MG tablet Take 1 tablet (50 mg total) by mouth every 6 (six) hours as needed., Starting Wed 07/04/2017, Print         Doristine Devoid, PA-C 07/04/17 1659    Gareth Morgan, MD 07/05/17 1300

## 2017-07-04 NOTE — ED Triage Notes (Signed)
Per pt, states she twisted right ankle about 5 days ago-states she has been trying to treat it herself-states increased pain, swells

## 2017-07-05 ENCOUNTER — Ambulatory Visit: Payer: Medicare Other | Admitting: Internal Medicine

## 2017-08-15 ENCOUNTER — Telehealth: Payer: Self-pay | Admitting: Internal Medicine

## 2017-08-15 NOTE — Telephone Encounter (Signed)
Erin Sarna  Ms Raynie Racey was supposed to come and see me with spiro/dlco. She has suddenly falled off the appt list and I see no followups. She has ILD . Whats going on? Can you call and ensure she has followup please  Thanks  Dr. Brand Males, M.D., [facility].C.P Pulmonary and Critical Care Medicine Staff Physician, [place] Director - Interstitial Lung Disease  Program  Pulmonary [place] at [place], [place], [address]  Pager: [phone], If no answer or between  15:00h - 7:00h: call [phone] Telephone: [phone]

## 2017-08-16 NOTE — Telephone Encounter (Signed)
Pt was supposed to have had an OV 07/05/17 with PFT prior but the appt for some reason was cancelled. Called left a message with pt so we can see if we could reschedule the OV with the PFT.  Will wait for pt to call back so I can see if we can reschedule this.

## 2017-08-23 NOTE — Telephone Encounter (Signed)
Please pursue this. If needed send a registered letter  Dr. Brand Males, M.D., [facility].C.P Pulmonary and Critical Care Medicine Staff Physician, [place] Director - Interstitial Lung Disease  Program  Pulmonary [place] at [place], [place], [address]  Pager: [phone], If no answer or between  15:00h - 7:00h: call [phone] Telephone: [phone]

## 2017-08-23 NOTE — Telephone Encounter (Signed)
Pt's appts were already rescheduled. PFT is on 1/25 at 9:00 followed by OV at 10:15. Nothing further needed.

## 2017-09-21 DIAGNOSIS — Z79899 Other long term (current) drug therapy: Secondary | ICD-10-CM | POA: Diagnosis not present

## 2017-09-21 DIAGNOSIS — R7982 Elevated C-reactive protein (CRP): Secondary | ICD-10-CM | POA: Diagnosis not present

## 2017-09-21 DIAGNOSIS — I10 Essential (primary) hypertension: Secondary | ICD-10-CM | POA: Diagnosis not present

## 2017-09-21 DIAGNOSIS — E7800 Pure hypercholesterolemia, unspecified: Secondary | ICD-10-CM | POA: Diagnosis not present

## 2017-09-25 DIAGNOSIS — I10 Essential (primary) hypertension: Secondary | ICD-10-CM | POA: Diagnosis not present

## 2017-09-25 DIAGNOSIS — Z79899 Other long term (current) drug therapy: Secondary | ICD-10-CM | POA: Diagnosis not present

## 2017-09-25 DIAGNOSIS — E7800 Pure hypercholesterolemia, unspecified: Secondary | ICD-10-CM | POA: Diagnosis not present

## 2017-09-28 ENCOUNTER — Ambulatory Visit (INDEPENDENT_AMBULATORY_CARE_PROVIDER_SITE_OTHER): Payer: Medicare Other | Admitting: Internal Medicine

## 2017-09-28 ENCOUNTER — Encounter: Payer: Self-pay | Admitting: Internal Medicine

## 2017-09-28 VITALS — BP 118/82 | HR 87 | Ht 65.0 in | Wt 150.2 lb

## 2017-09-28 DIAGNOSIS — J439 Emphysema, unspecified: Secondary | ICD-10-CM | POA: Diagnosis not present

## 2017-09-28 DIAGNOSIS — J849 Interstitial pulmonary disease, unspecified: Secondary | ICD-10-CM | POA: Diagnosis not present

## 2017-09-28 DIAGNOSIS — J8410 Pulmonary fibrosis, unspecified: Secondary | ICD-10-CM

## 2017-09-28 DIAGNOSIS — J449 Chronic obstructive pulmonary disease, unspecified: Secondary | ICD-10-CM | POA: Diagnosis not present

## 2017-09-28 LAB — PULMONARY FUNCTION TEST
DL/VA % pred: 58 %
DL/VA: 2.89 ml/min/mmHg/L
DLCO COR % PRED: 47 %
DLCO UNC: 12.59 ml/min/mmHg
DLCO cor: 12.26 ml/min/mmHg
DLCO unc % pred: 49 %
FEF 25-75 Post: 1.08 L/sec
FEF 25-75 Pre: 0.79 L/sec
FEF2575-%Change-Post: 36 %
FEF2575-%Pred-Post: 63 %
FEF2575-%Pred-Pre: 46 %
FEV1-%CHANGE-POST: 9 %
FEV1-%PRED-POST: 74 %
FEV1-%Pred-Pre: 68 %
FEV1-POST: 1.65 L
FEV1-PRE: 1.51 L
FEV1FVC-%Change-Post: 9 %
FEV1FVC-%Pred-Pre: 84 %
FEV6-%Change-Post: -1 %
FEV6-%PRED-POST: 83 %
FEV6-%PRED-PRE: 84 %
FEV6-POST: 2.34 L
FEV6-PRE: 2.37 L
FEV6FVC-%CHANGE-POST: 0 %
FEV6FVC-%PRED-POST: 104 %
FEV6FVC-%PRED-PRE: 104 %
FVC-%CHANGE-POST: 0 %
FVC-%PRED-POST: 80 %
FVC-%PRED-PRE: 81 %
FVC-POST: 2.37 L
FVC-PRE: 2.38 L
POST FEV6/FVC RATIO: 100 %
PRE FEV6/FVC RATIO: 100 %
Post FEV1/FVC ratio: 70 %
Pre FEV1/FVC ratio: 63 %
RV % PRED: 120 %
RV: 2.81 L
TLC % PRED: 102 %
TLC: 5.35 L

## 2017-09-28 NOTE — Progress Notes (Signed)
PFT completed today 09/28/17

## 2017-09-28 NOTE — Patient Instructions (Addendum)
ICD-10-CM   1. 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 ILD (interstitial lung disease) ([place]) J84.9   3. COPD, moderate ([place]) J44.9     Lung function with improvement Glad you also feel better  Plan Continue breo In 3 months do Pre-bd spiro and dlco only. No lung volume or bd response. No post-bd spiro  Followup 3 months after above breathing test; will decide on CT timing baed on those breathing test

## 2017-09-28 NOTE — Progress Notes (Signed)
Subjective:     Patient ID: Erin Li, female   DOB: May 03, 1941, 77 y.o.   MRN: [id number]  HPI    OV 04/10/2017   Chief Complaint  Patient presents with  . Follow-up    Pt states her breathing is unchanged since last OV. Pt states she has mild non prod cough. Pt denies CP/tightness, f/c/s, and chest congestion.    Follow-up COPD but now has concerned that she has developed interstitial lung disease  She is here with her husband. Back in 2015 a high-resolution CT chest because I thought I heard crackles and thoracic radiology ruled out interstitial lung disease. In the interim she's had lung cancer screening CT chest at overall stability in her pulmonary function test. But there was concern because of crackles that she might have interstitial lung disease so we did a high-resolution CT chest and July 2018 and thoracic radiologist reported as definite precedence of ILD although it is not a UIP pattern. [name] 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 She did have autoimmune panel that is only trace positive for ENP . 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 She denies any humidifier or mildew or mold in the house. The CT chest also shows coronary artery calcification. Of note I personally visualized the CT chest.  ACCP ILD questiin  - symptioms: occ cough x 2 years with some night cough and mild phleogn ona nd off. Very mild non troubling dyspnea x 3 years - pasth medical : GERD +, mild dysphagia + - personal exposure x -: +ver for remote use of rec / street drugs. Cig smoked age 59 though 72 at 53 cigs/dat - fam hx: COPD +, son with transplant for ?  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 Has a [place]aw bird x > 20 years - residence: [place], [place], [place], [place],  [place] - occupatin: bookeeper - pulmotiox hx: negagive  Results for Erin, Li (MRN [id number]) as of 04/10/2017 11:21  Ref. Range 04/03/2017 10:18  CK Total Latest Ref Range: 7 - 177 U/L 63  CK-MB Latest Ref Range: 0.3 - 4.0 ng/mL 2.4  Aldolase Latest Ref Range: <=8.1 U/L 3.5  Sed Rate Latest Ref Range: 0 - 30 mm/hr 20  Anit Nuclear Antibody(ANA) Latest Ref Range: NEGATIVE  NEG  Angiotensin-Converting Enzyme Latest Ref Range: 9 - 67 U/L 67  Cyclic Citrullin Peptide Ab Latest Units: Units <16  ds DNA Ab Latest Units: IU/mL 1  ENA RNP Ab Latest Ref Range: 0.0 - 0.9 AI 1.4 (H)  RA Latex Turbid. Latest Ref Range: <14 IU/mL <14  SSA (Ro) (ENA) Antibody, IgG Latest Ref Range: <1.0 NEG AI  <1.0 NEG  SSB (La) (ENA) Antibody, IgG Latest Ref Range: <1.0 NEG AI  <1.0 NEG  Scleroderma (Scl-70) (ENA) Antibody, IgG Latest Ref Range: <1.0 NEG AI  <1.0 NEG    OV 09/28/2017  Chief Complaint  Patient presents with  . Follow-up    PFT done today. States she has been doing good and has no complaints of cough, SOB, or CP.    Fu mixed empyhsema and pulmonary fibrosis  For  years of only known who is a COPD patient with  a family history of pulmonary fibrosis.  But it appears that in 2018 she started developing signs of pulmonary fibrosis with crackles and findings of indeterminate UIP on CT chest July 2018.  She has family history of pulmonary fibrosis and that her son had lung transplant for the same.  She now presents for follow-up.  In the interim she started feeling better.  She is less dyspneic.  She continues with the Brio.  There are no new issues.  Pulmonary function test done as documented below shows an improvement.  Therefore instead of biopsy she feels keeping an eye on this is a better approach.    Walking desaturation test on 09/28/2017 185 feet x 3 laps on ROOM AIR:  did NOT desaturate. Rest pulse ox was 99%, final pulse ox was96 %. HR response 78 /min at rest to 105/min at peak exertion.  Patient Erin Li  Did nto Desaturate < 88% . Erin Li yes  Desaturated </= 3% points. Erin Li yes did get tachyardic  Results for Erin, Li (MRN [id number]) as of 09/28/2017 10:18  Ref. Range 07/09/2015 08:47 10/01/2015 08:25 12/07/2015 08:42 04/03/2017 09:51 09/28/2017 08:45  FVC-Pre Latest Units: L 2.26 2.35 2.38 2.20 2.38  FVC-%Pred-Pre Latest Units: % 78 81 82 77 81    Review of Systems     Objective:   Physical Exam  Constitutional: She is oriented to person, place, and time. She appears well-developed and well-nourished. No distress.  HENT:  Head: Normocephalic and atraumatic.  Right Ear: External ear normal.  Left Ear: External ear normal.  Mouth/Throat: Oropharynx is clear and moist. No oropharyngeal exudate.  Eyes: Conjunctivae and EOM are normal. Pupils are equal, round, and reactive to light. Right eye exhibits no discharge. Left eye exhibits no discharge. No scleral icterus.  Neck: Normal range of motion. Neck supple. No JVD present. No tracheal deviation present. No thyromegaly present.  Cardiovascular: Normal rate, regular rhythm, normal heart sounds and intact distal pulses. Exam reveals no gallop and no friction rub.  No murmur heard. Pulmonary/Chest: Effort normal and breath sounds normal. No respiratory distress. She has no wheezes. She has no rales. She exhibits no tenderness.  bareley any crackl;es. Some at R > L base  Abdominal: Soft. Bowel sounds are normal. She exhibits no distension and no mass. There is no tenderness. There is no rebound and no guarding.  Musculoskeletal: Normal range of motion. She exhibits no edema or tenderness.  Lymphadenopathy:    She has no cervical adenopathy.  Neurological: She is alert and oriented to person, place, and time. She has normal reflexes. No cranial nerve deficit. She exhibits normal muscle tone. Coordination normal.  Skin: Skin is warm and dry. No rash noted. She is not diaphoretic. No erythema. No  pallor.  Psychiatric: She has a normal mood and affect. Her behavior is normal. Judgment and thought content normal.  Vitals reviewed.  Vitals:   09/28/17 0947  BP: 118/82  Pulse: 87  SpO2: 95%  Weight: 150 lb 3.2 oz (68.1 kg)  Height: 5\' 5"  (1.651 m)       Assessment:       ICD-10-CM   1. 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 ILD (interstitial lung disease) ([place]) J84.9   3. COPD, moderate ([place]) J44.9        Plan:      Lung function with improvement Glad you also feel better  Plan Continue breo Given improvement in lung  function tests for this indeterminate ILD we decided to have an expectant approach. In 3 months do Pre-bd spiro and dlco only. No lung volume or bd response. No post-bd spiro  Followup 3 months after above breathing test; will decide on CT timing baed on those breathing test   Dr. Brand Males, M.D., [facility].C.P Pulmonary and Critical Care Medicine Staff Physician, [facility] Director - Interstitial Lung Disease  Program  Pulmonary [place] at [place], [place], [address]  Pager: [phone], If no answer or between  15:00h - 7:00h: call [phone] Telephone: [phone]

## 2017-10-26 ENCOUNTER — Telehealth: Payer: Self-pay | Admitting: Internal Medicine

## 2017-10-26 MED ORDER — AZITHROMYCIN 250 MG PO TABS: ORAL_TABLET | ORAL | 0 refills | Status: DC

## 2017-10-26 NOTE — Telephone Encounter (Signed)
Call in z pack.   

## 2017-10-26 NOTE — Telephone Encounter (Signed)
Called pt and advised message from the provider. Pt understood and verbalized understanding. Nothing further is needed.    Rx sent in. 

## 2017-10-26 NOTE — Telephone Encounter (Signed)
Called and spoke with pt who stated she has been coughing up yellow phlegm, has head and chest congestion, and might have a low grade fever. Denies any body aches/chills.  Symptoms all began yesterday, 10/25/17.  Routing this to DOD since MR is unavailable.  Dr. Vaughan Browner, please advise on recommendations for pt. Thanks!

## 2017-12-03 ENCOUNTER — Other Ambulatory Visit: Payer: Self-pay | Admitting: Internal Medicine

## 2018-01-01 ENCOUNTER — Ambulatory Visit (INDEPENDENT_AMBULATORY_CARE_PROVIDER_SITE_OTHER): Payer: Medicare Other | Admitting: Internal Medicine

## 2018-01-01 ENCOUNTER — Encounter: Payer: Self-pay | Admitting: Internal Medicine

## 2018-01-01 VITALS — BP 122/76 | HR 80 | Ht 65.0 in | Wt 150.4 lb

## 2018-01-01 DIAGNOSIS — J8410 Pulmonary fibrosis, unspecified: Secondary | ICD-10-CM

## 2018-01-01 DIAGNOSIS — J439 Emphysema, unspecified: Secondary | ICD-10-CM

## 2018-01-01 DIAGNOSIS — J849 Interstitial pulmonary disease, unspecified: Secondary | ICD-10-CM | POA: Diagnosis not present

## 2018-01-01 LAB — PULMONARY FUNCTION TEST
DL/VA % PRED: 59 %
DL/VA: 2.91 ml/min/mmHg/L
DLCO UNC % PRED: 80 %
DLCO UNC: 20.54 ml/min/mmHg
FEF 25-75 Pre: 1.04 L/sec
FEF2575-%PRED-PRE: 61 %
FEV1-%PRED-PRE: 74 %
FEV1-Pre: 1.63 L
FEV1FVC-%PRED-PRE: 91 %
FEV6-%Pred-Pre: 85 %
FEV6-Pre: 2.37 L
FEV6FVC-%Pred-Pre: 104 %
FVC-%Pred-Pre: 81 %
FVC-Pre: 2.38 L
PRE FEV1/FVC RATIO: 68 %
PRE FEV6/FVC RATIO: 100 %

## 2018-01-01 NOTE — Progress Notes (Signed)
PFT before and after completed 01/01/18

## 2018-01-01 NOTE — Patient Instructions (Signed)
ICD-10-CM   1. ILD (interstitial lung disease) ([place]) J84.9   2. 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 No lung volume or bd response. No post-bd spiro   Followup - Oct-dec  2019 after above tests; return sooner if needed

## 2018-01-01 NOTE — Progress Notes (Signed)
Subjective:     Patient ID: Erin Li, female   DOB: 10-Jun-1941, 77 y.o.   MRN: [id number]  HPI   OV 04/10/2017   Chief Complaint  Patient presents with  . Follow-up    Pt states her breathing is unchanged since last OV. Pt states she has mild non prod cough. Pt denies CP/tightness, f/c/s, and chest congestion.    Follow-up COPD but now has concerned that she has developed interstitial lung disease  She is here with her husband. Back in 2015 a high-resolution CT chest because I thought I heard crackles and thoracic radiology ruled out interstitial lung disease. In the interim she's had lung cancer screening CT chest at overall stability in her pulmonary function test. But there was concern because of crackles that she might have interstitial lung disease so we did a high-resolution CT chest and July 2018 and thoracic radiologist reported as definite precedence of ILD although it is not a UIP pattern. [name] She does say that her son and over the lung transplant in [place] 8 years ago at age of 43 because of IPF. She did have autoimmune panel that is only trace positive for ENP . 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 She denies any humidifier or mildew or mold in the house. The CT chest also shows coronary artery calcification. Of note I personally visualized the CT chest.  ACCP ILD questiin  - symptioms: occ cough x 2 years with some night cough and mild phleogn ona nd off. Very mild non troubling dyspnea x 3 years - pasth medical : GERD +, mild dysphagia + - personal exposure x -: +ver for remote use of rec / street drugs. Cig smoked age 42 though 29 at 37 cigs/dat - fam hx: COPD +, son with transplant for ?  IPF at age 89 - home exposurE: does have hot tube - uses it once a month. Has a [place] bird x > 20 years - residence: [place], [place], [place], [place],  [place] - occupatin: bookeeper - pulmotiox hx: negagive  Results for ALAHNI, VARONE (MRN [id number]) as of 04/10/2017 11:21  Ref. Range 04/03/2017 10:18  CK Total Latest Ref Range: 7 - 177 U/L 63  CK-MB Latest Ref Range: 0.3 - 4.0 ng/mL 2.4  Aldolase Latest Ref Range: <=8.1 U/L 3.5  Sed Rate Latest Ref Range: 0 - 30 mm/hr 20  Anit Nuclear Antibody(ANA) Latest Ref Range: NEGATIVE  NEG  Angiotensin-Converting Enzyme Latest Ref Range: 9 - 67 U/L 67  Cyclic Citrullin Peptide Ab Latest Units: Units <16  ds DNA Ab Latest Units: IU/mL 1  ENA RNP Ab Latest Ref Range: 0.0 - 0.9 AI 1.4 (H)  RA Latex Turbid. Latest Ref Range: <14 IU/mL <14  SSA (Ro) (ENA) Antibody, IgG Latest Ref Range: <1.0 NEG AI  <1.0 NEG  SSB (La) (ENA) Antibody, IgG Latest Ref Range: <1.0 NEG AI  <1.0 NEG  Scleroderma (Scl-70) (ENA) Antibody, IgG Latest Ref Range: <1.0 NEG AI  <1.0 NEG    OV 09/28/2017  Chief Complaint  Patient presents with  . Follow-up    PFT done today. States she has been doing good and has no complaints of cough, SOB, or CP.    Fu mixed empyhsema and pulmonary fibrosis  For  years of only known who is a COPD patient with a  family history of pulmonary fibrosis.  But it appears that in 2018 she started developing signs of pulmonary fibrosis with crackles and findings of indeterminate UIP on CT chest July 2018.  She has family history of pulmonary fibrosis and that her son had lung transplant for the same.  She now presents for follow-up.  In the interim she started feeling better.  She is less dyspneic.  She continues with the Brio.  There are no new issues.  Pulmonary function test done as documented below shows an improvement.  Therefore instead of biopsy she feels keeping an eye on this is a better approach.    Walking desaturation test on 09/28/2017 185 feet x 3 laps on ROOM AIR:  did NOT desaturate. Rest pulse ox was 99%, final pulse ox was96 %. HR response 78 /min at rest to 105/min at peak exertion.  Patient Erin Li  Did nto Desaturate < 88% . Erin Li yes  Desaturated </= 3% points. Erin Li yes did get tachyardic   OV 01/01/2018  Chief Complaint  Patient presents with  . Follow-up    PFT done today.  Pt states she has been doing good since last visit. Had a cold mid January 2019 but has been better since. Denies any complaints of cough, SOB, or CP.   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 She continues on Brio. Since last visit January 2019 she continues to feel better. COPD cat score is very minimal as documented below. She had pulmonary function test today and this shows stability in her FVC after initial improvement. Her DLCO is even better. She is on observation course regarding her ILD given the improvement in her pulmonary function test. Her last CT scan of the chest was July 2018      CAT COPD Symptom & Quality of Life Score ([place]) 0 is no burden. 5 is highest burden 01/01/2018   Never Cough -> Cough all the time 2  No phlegm in chest -> Chest is full of phlegm 2  No chest tightness -> Chest feels very tight 0  No dyspnea for 1 flight stairs/hill -> Very dyspneic for 1 flight of stairs 2  No limitations for ADL at home -> Very limited with ADL at home 0  Confident leaving home -> Not at all confident leaving home 0  Sleep soundly -> Do not sleep soundly because of lung condition 0  Lots of Energy -> No energy at all 0  TOTAL Score (max 40)  6        Results for JISELLA, ASHENFELTER (MRN [id number]) as of 01/01/2018 09:31  Ref. Range 10/01/2015 08:25 12/07/2015 08:42 04/03/2017 09:51 09/28/2017 08:45 01/01/2018 08:51  FVC-Pre Latest Units: L 2.35 2.38 2.20 2.38 2.38  FVC-%Pred-Pre Latest Units: % 81 82 77 81 81  Results for TONIA, AVINO (MRN [id number]) as of 01/01/2018 09:31  Ref. Range 10/01/2015 08:25 12/07/2015 08:42  04/03/2017 09:51 09/28/2017 08:45 01/01/2018 08:51  FEV1-Pre Latest Units: L 1.41 1.54 1.32 1.51 1.63  FEV1-%Pred-Pre Latest Units: % 64 71 62 68 74  Pre FEV1/FVC ratio Latest Units: % 60 65 60 63 68  Results for GRAVIELA, NODAL (MRN [id number]) as of 01/01/2018 09:31  Ref. Range 10/01/2015 08:25 12/07/2015 08:42 04/03/2017 09:51 09/28/2017 08:45 01/01/2018 08:51  DLCO unc Latest Units: ml/min/mmHg   11.50 12.59 20.54  DLCO unc %  pred Latest Units: %   47 49 80    Review of Systems     Objective:   Physical Exam Today's Vitals   01/01/18 0927  BP: 122/76  Pulse: 80  SpO2: 98%  Weight: 150 lb 6.4 oz (68.2 kg)  Height: 5\' 5"  (1.651 m)    Estimated body mass index is 25.03 kg/m as calculated from the following:   Height as of this encounter: 5\' 5"  (1.651 m).   Weight as of this encounter: 150 lb 6.4 oz (68.2 kg).   General Appearance:    Looks well   Head:    Normocephalic, without obvious abnormality, atraumatic  Eyes:    PERRL - yes, conjunctiva/corneas - clear      Ears:    Normal external ear canals, both ears  Nose:   NG tube - no  Throat:  ETT TUBE - no , OG tube - no  Neck:   Supple,  No enlargement/tenderness/nodules     Lungs:     Clear to auscultation bilaterally but at lung base there are still crackles althoiugh I think diminished over time  Chest wall:    No deformity  Heart:    S1 and S2 normal, no murmur, CVP - no.  Pressors - no  Abdomen:     Soft, no masses, no organomegaly  Genitalia:    Not done  Rectal:   not done  Extremities:   Extremities- intact     Skin:   Intact in exposed areas .     Neurologic:   Sedation - none -> RASS - NA . Moves all 4s - yes. CAM-ICU - neg . Orientation - x3 +        Assessment:       ICD-10-CM   1. ILD (interstitial lung disease) ([facility]) J84.9   2. Pulmonary emphysema with fibrosis of lung ([place] [facility]) J43.9    J84.10        Plan:       Stable/slightly better  Plan - continue breo  - repeat HRCT inOct -Dec  2019 -  repeat Oct-dec 2019:  Pre-bd spiro and dlco only. No lung volume or bd response. No post-bd spiro   Followup - Oct-dec  2019 after above tests; return sooner if needed    Dr. Brand Males, M.D., [facility].C.P Pulmonary and Critical Care Medicine Staff Physician, [place] Director - Interstitial Lung Disease  Program  Pulmonary [place] at [place], [place], [address]  Pager: [phone], If no answer or between  15:00h - 7:00h: call [phone] Telephone: [phone]

## 2018-01-18 ENCOUNTER — Other Ambulatory Visit: Payer: Self-pay | Admitting: Internal Medicine

## 2018-01-18 DIAGNOSIS — Z1231 Encounter for screening mammogram for malignant neoplasm of breast: Secondary | ICD-10-CM

## 2018-02-08 ENCOUNTER — Ambulatory Visit
Admission: RE | Admit: 2018-02-08 | Discharge: 2018-02-08 | Disposition: A | Discharge status: Home / Self Care | Payer: Medicare Other | Source: Ambulatory Visit | Attending: Internal Medicine | Admitting: Internal Medicine

## 2018-02-08 DIAGNOSIS — Z1231 Encounter for screening mammogram for malignant neoplasm of breast: Secondary | ICD-10-CM

## 2018-02-23 ENCOUNTER — Other Ambulatory Visit: Payer: Self-pay | Admitting: Internal Medicine

## 2018-03-25 ENCOUNTER — Ambulatory Visit (INDEPENDENT_AMBULATORY_CARE_PROVIDER_SITE_OTHER)
Admission: RE | Admit: 2018-03-25 | Discharge: 2018-03-25 | Disposition: A | Discharge status: Home / Self Care | Payer: Medicare Other | Source: Ambulatory Visit | Attending: Acute Care | Admitting: Acute Care

## 2018-03-25 DIAGNOSIS — I10 Essential (primary) hypertension: Secondary | ICD-10-CM | POA: Diagnosis not present

## 2018-03-25 DIAGNOSIS — Z79899 Other long term (current) drug therapy: Secondary | ICD-10-CM | POA: Diagnosis not present

## 2018-03-25 DIAGNOSIS — Z87891 Personal history of nicotine dependence: Secondary | ICD-10-CM | POA: Diagnosis not present

## 2018-03-25 DIAGNOSIS — Z5181 Encounter for therapeutic drug level monitoring: Secondary | ICD-10-CM | POA: Diagnosis not present

## 2018-03-25 DIAGNOSIS — E7800 Pure hypercholesterolemia, unspecified: Secondary | ICD-10-CM | POA: Diagnosis not present

## 2018-04-01 DIAGNOSIS — Z780 Asymptomatic menopausal state: Secondary | ICD-10-CM | POA: Diagnosis not present

## 2018-04-01 DIAGNOSIS — I700 Atherosclerosis of aorta: Secondary | ICD-10-CM | POA: Diagnosis not present

## 2018-04-01 DIAGNOSIS — J449 Chronic obstructive pulmonary disease, unspecified: Secondary | ICD-10-CM | POA: Diagnosis not present

## 2018-04-01 DIAGNOSIS — F419 Anxiety disorder, unspecified: Secondary | ICD-10-CM | POA: Diagnosis not present

## 2018-04-01 DIAGNOSIS — Z79899 Other long term (current) drug therapy: Secondary | ICD-10-CM | POA: Diagnosis not present

## 2018-04-01 DIAGNOSIS — Z0000 Encounter for general adult medical examination without abnormal findings: Secondary | ICD-10-CM | POA: Diagnosis not present

## 2018-04-01 DIAGNOSIS — I10 Essential (primary) hypertension: Secondary | ICD-10-CM | POA: Diagnosis not present

## 2018-04-02 ENCOUNTER — Other Ambulatory Visit: Payer: Self-pay | Admitting: Acute Care

## 2018-04-02 DIAGNOSIS — Z122 Encounter for screening for malignant neoplasm of respiratory organs: Secondary | ICD-10-CM

## 2018-04-02 DIAGNOSIS — Z87891 Personal history of nicotine dependence: Secondary | ICD-10-CM

## 2018-04-08 ENCOUNTER — Telehealth: Payer: Self-pay | Admitting: Internal Medicine

## 2018-04-08 MED ORDER — DOXYCYCLINE HYCLATE 100 MG PO TABS: 100.0000 mg | ORAL_TABLET | Freq: Two times a day (BID) | ORAL | 0 refills | Status: DC

## 2018-04-08 MED ORDER — PREDNISONE 10 MG PO TABS: ORAL_TABLET | ORAL | 0 refills | Status: DC

## 2018-04-08 NOTE — Telephone Encounter (Signed)
    No Known Allergies   Take doxycycline 100mg  po twice daily x 5 days; take after meals and avoid sunlight  Please take prednisone 40 mg x1 day, then 30 mg x1 day, then 20 mg x1 day, then 10 mg x1 day, and then 5 mg x1 day and stop

## 2018-04-08 NOTE — Telephone Encounter (Signed)
Called and spoke with pt regarding MR recommendations. Placed order for rx doxcy and prednisone taper to [facility] Nothing further needed.

## 2018-04-08 NOTE — Telephone Encounter (Signed)
Called spoke with patient who complains of head congestion, PND, sore throat, yellow nasal drainage, prod cough with thick yellow mucus and some tightness in chest x3 days.  Patient denies any hemoptysis, f/c/s, chest pain, wheezing.  Patient is requesting Rx be sent to [facility] in [place] NKDA   Last ov 01/01/18 with MR: Patient Instructions      ICD-10-CM    1. ILD (interstitial lung disease) ([place]) J84.9    2. Pulmonary emphysema with fibrosis of lung ([place]) J43.9      J84.10    Stable/slightly better   Plan - continue breo  - repeat HRCT inOct -Dec  2019 - repeat Oct-dec 2019:  Pre-bd spiro and dlco only. No lung volume or bd response. No post-bd spiro     Followup - Oct-dec  2019 after above tests; return sooner if needed

## 2018-05-04 ENCOUNTER — Other Ambulatory Visit: Payer: Self-pay | Admitting: Internal Medicine

## 2018-05-10 ENCOUNTER — Telehealth: Payer: Self-pay | Admitting: Internal Medicine

## 2018-05-10 NOTE — Telephone Encounter (Signed)
Patient had a low dose ct on 03/25/18 will she still need the high res Ct for 06/04/18?  MR please advise on this. Thanks!

## 2018-05-13 NOTE — Telephone Encounter (Signed)
Will route to [place].

## 2018-05-13 NOTE — Telephone Encounter (Signed)
Cancelled pt's HRCT.  Called and spoke with pt and scheduled follow up visit November 5 with PFT at 1:00 followed by OV at 1:30 in ILD clinic.  Nothing further needed.

## 2018-05-13 NOTE — Telephone Encounter (Signed)
Erin Li: not sure how this happened. When she was getting HRCT order a lung cancer screen order went through? In any event, as much as LDCT is low quality it seems that the reticulation Is stable. So, cancel HRCT but ensure she has PFT (spiro and dlco) and followup.   Please let me know how this mixup happened?  Thanks    SIGNATURE    Dr. Brand Males, M.D., F.C.C.P,  Pulmonary and Critical Care Medicine Staff Physician, [place] Director - Interstitial Lung Disease  Program  Pulmonary [place] at [place], [place], [address]  Pager: [phone], If no answer or between  15:00h - 7:00h: call [phone] Telephone: [phone]  3:18 PM 05/13/2018

## 2018-05-20 DIAGNOSIS — Z23 Encounter for immunization: Secondary | ICD-10-CM | POA: Diagnosis not present

## 2018-06-26 DIAGNOSIS — K219 Gastro-esophageal reflux disease without esophagitis: Secondary | ICD-10-CM | POA: Diagnosis not present

## 2018-06-26 DIAGNOSIS — I441 Atrioventricular block, second degree: Secondary | ICD-10-CM | POA: Diagnosis not present

## 2018-06-26 DIAGNOSIS — J849 Interstitial pulmonary disease, unspecified: Secondary | ICD-10-CM | POA: Diagnosis not present

## 2018-06-26 DIAGNOSIS — I2510 Atherosclerotic heart disease of native coronary artery without angina pectoris: Secondary | ICD-10-CM | POA: Diagnosis not present

## 2018-07-06 ENCOUNTER — Other Ambulatory Visit: Payer: Self-pay | Admitting: Internal Medicine

## 2018-07-09 ENCOUNTER — Encounter: Payer: Self-pay | Admitting: Internal Medicine

## 2018-07-09 ENCOUNTER — Ambulatory Visit (INDEPENDENT_AMBULATORY_CARE_PROVIDER_SITE_OTHER): Payer: Medicare Other | Admitting: Internal Medicine

## 2018-07-09 VITALS — BP 126/78 | HR 69 | Ht 65.0 in | Wt 150.4 lb

## 2018-07-09 DIAGNOSIS — J8410 Pulmonary fibrosis, unspecified: Secondary | ICD-10-CM | POA: Diagnosis not present

## 2018-07-09 DIAGNOSIS — J441 Chronic obstructive pulmonary disease with (acute) exacerbation: Secondary | ICD-10-CM

## 2018-07-09 DIAGNOSIS — J439 Emphysema, unspecified: Secondary | ICD-10-CM

## 2018-07-09 DIAGNOSIS — J849 Interstitial pulmonary disease, unspecified: Secondary | ICD-10-CM

## 2018-07-09 LAB — PULMONARY FUNCTION TEST
DL/VA % pred: 57 %
DL/VA: 2.84 ml/min/mmHg/L
DLCO UNC % PRED: 47 %
DLCO UNC: 12.18 ml/min/mmHg
FEF 25-75 Pre: 0.55 L/sec
FEF2575-%Pred-Pre: 32 %
FEV1-%Pred-Pre: 62 %
FEV1-PRE: 1.38 L
FEV1FVC-%Pred-Pre: 79 %
FEV6-%PRED-PRE: 80 %
FEV6-Pre: 2.24 L
FEV6FVC-%PRED-PRE: 101 %
FVC-%PRED-PRE: 79 %
FVC-Pre: 2.33 L
PRE FEV1/FVC RATIO: 59 %
Pre FEV6/FVC Ratio: 96 %

## 2018-07-09 MED ORDER — DOXYCYCLINE HYCLATE 100 MG PO TABS: 100.0000 mg | ORAL_TABLET | Freq: Two times a day (BID) | ORAL | 0 refills | Status: DC

## 2018-07-09 MED ORDER — PREDNISONE 10 MG PO TABS: ORAL_TABLET | ORAL | 0 refills | Status: DC

## 2018-07-09 NOTE — Progress Notes (Signed)
Spirometry and Dlco done today. 

## 2018-07-09 NOTE — Patient Instructions (Signed)
ICD-10-CM   1. COPD with acute exacerbation ([facility]) J44.1   2. Pulmonary emphysema with fibrosis of lung ([facility]) J43.9    J84.10   3. ILD (interstitial lung disease) ([facility]) J84.9      COPD :   -Mild exacerbation - Please take prednisone 40 mg x1 day, then 30 mg x1 day, then 20 mg x1 day, then 10 mg x1 day, and then 5 mg x1 day and stop - Take doxycycline 100mg  po twice daily x 5 days; take after meals and avoid sunlight  ILD  -Actually this is not better but still present and it is stable and mild -Given the fact your son had pulmonary fibrosis I think a biopsy would be recommended but given the fact it is stable and the fact he recently died and you going through grief we will hold off   Follow-up -In 4 months do spirometry and DLCO -Return to ILD clinic or regular clinic in 4 months but after spirometry and DLCO

## 2018-07-09 NOTE — Progress Notes (Signed)
OV 04/10/2017   Chief Complaint  Patient presents with  . Follow-up    Pt states her breathing is unchanged since last OV. Pt states she has mild non prod cough. Pt denies CP/tightness, f/c/s, and chest congestion.    Follow-up COPD but now has concerned that she has developed interstitial lung disease  She is here with her Erin Li. Back in 2015 a high-resolution CT chest because I thought I heard crackles and thoracic radiology ruled out interstitial lung disease. In the interim she's had lung cancer screening CT chest at overall stability in her pulmonary function test. But there was concern because of crackles that she might have interstitial lung disease so we did a high-resolution CT chest and July 2018 and thoracic radiologist reported as definite precedence of ILD although it is not a UIP pattern. [name] She does say that her son and over the lung transplant in [place] 8 years ago at age of 38 because of IPF. She did have autoimmune panel that is only trace positive for ENP . 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 She denies any humidifier or mildew or mold in the house. The CT chest also shows coronary artery calcification. Of note I personally visualized the CT chest.  ACCP ILD questiin  - symptioms: occ cough x 2 years with some night cough and mild phleogn ona nd off. Very mild non troubling dyspnea x 3 years - pasth medical : GERD +, mild dysphagia + - personal exposure x -: +ver for remote use of rec / street drugs. Cig smoked age 28 though 28 at 68 cigs/dat - fam hx: COPD +, son with transplant for ?  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 Has a Macaw bird x > 20 years - residence: [place], [place], [place], [place], [place] - occupatin: bookeeper - pulmotiox hx: negagive  Results for [name], [name] (MRN [id number]) as  of 04/10/2017 11:21  Ref. Range 04/03/2017 10:18  CK Total Latest Ref Range: 7 - 177 U/L 63  CK-MB Latest Ref Range: 0.3 - 4.0 ng/mL 2.4  Aldolase Latest Ref Range: <=8.1 U/L 3.5  Sed Rate Latest Ref Range: 0 - 30 mm/hr 20  Anit Nuclear Antibody(ANA) Latest Ref Range: NEGATIVE  NEG  Angiotensin-Converting Enzyme Latest Ref Range: 9 - 67 U/L 67  Cyclic Citrullin Peptide Ab Latest Units: Units <16  ds DNA Ab Latest Units: IU/mL 1  ENA RNP Ab Latest Ref Range: 0.0 - 0.9 AI 1.4 (H)  RA Latex Turbid. Latest Ref Range: <14 IU/mL <14  SSA (Ro) (ENA) Antibody, IgG Latest Ref Range: <1.0 NEG AI  <1.0 NEG  SSB (La) (ENA) Antibody, IgG Latest Ref Range: <1.0 NEG AI  <1.0 NEG  Scleroderma (Scl-70) (ENA) Antibody, IgG Latest Ref Range: <1.0 NEG AI  <1.0 NEG    OV 09/28/2017  Chief Complaint  Patient presents with  . Follow-up    PFT done today. States she has been doing good and has no complaints of cough, SOB, or CP.    Fu mixed empyhsema and pulmonary fibrosis  For  years of only known who is a COPD patient with a family history of pulmonary fibrosis.  But it appears that in 2018 she started developing signs of pulmonary fibrosis with crackles  and findings of indeterminate UIP on CT chest July 2018.  She has family history of pulmonary fibrosis and that her son had lung transplant for the same.  She now presents for follow-up.  In the interim she started feeling better.  She is less dyspneic.  She continues with the Brio.  There are no new issues.  Pulmonary function test done as documented below shows an improvement.  Therefore instead of biopsy she feels keeping an eye on this is a better approach.    Walking desaturation test on 09/28/2017 185 feet x 3 laps on ROOM AIR:  did NOT desaturate. Rest pulse ox was 99%, final pulse ox was96 %. HR response 78 /min at rest to 105/min at peak exertion. Patient Erin Li  Did nto Desaturate < 88% . Erin Li yes  Desaturated </= 3% points.  Erin Li yes did get tachyardic   OV 01/01/2018  Chief Complaint  Patient presents with  . Follow-up    PFT done today.  Pt states she has been doing good since last visit. Had a cold mid January 2019 but has been better since. Denies any complaints of cough, SOB, or CP.   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 She continues on Brio. Since last visit January 2019 she continues to feel better. COPD cat score is very minimal as documented below. She had pulmonary function test today and this shows stability in her FVC after initial improvement. Her DLCO is even better. She is on observation course regarding her ILD given the improvement in her pulmonary function test. Her last CT scan of the chest was July 2018        OV 07/09/2018  Subjective:  Patient ID: Erin Li, female , DOB: 02/15/1941 , age 77 y.o. , MRN: [id number] , ADDRESS: [place] [address]   07/09/2018 -   Chief Complaint  Patient presents with  . Consult    Alot of coughing producing mucus no color at this time.     HPI Erin Li 77 y.o. 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  She tells me that for the last 3 weeks has had increased cough with congestion and white sputum more than baseline but activities of daily living and shortness of breath or guarding is all unchanged and she has good effort tolerance.  There are no new other issues.  In terms of ILD: Last visit I thought I will be in improved based on improvement in pulmonary function test but actually that might of been a red herring because today's pulmonary function test shows a decline which is in line with previous pulmonary function test showing stability.  She certainly feels stable.   In July 2019 she had a low-dose CT scan for lung cancer and this ruled out lung cancer on screening.  They were able to report some mildly changes at the base.  She is not interested in surgical lung biopsy because her son who had pulmonary fibrosis finally died from 3 years of transplant from pulmonary embolism, stroke and acute renal failure and chronic critical illness in the rehab facility.  CAT COPD Symptom & Quality of Life Score (GSK trademark) 0 is no burden. 5 is highest burden 01/01/2018  Never Cough -> Cough all the time 2  No phlegm in chest -> Chest is full of phlegm 2  No chest tightness -> Chest feels very tight 0  No dyspnea for 1 flight stairs/hill -> Very dyspneic for 1 flight of stairs 2  No limitations for ADL at home -> Very limited with ADL at home 0  Confident leaving home -> Not at all confident leaving home 0  Sleep soundly -> Do not sleep soundly because of lung condition 0  Lots of Energy -> No energy at all 0  TOTAL Score (max 40)  6    Results for Erin Li, Erin Li (MRN [id number]) as of 07/09/2018 13:55  Ref. Range 08/07/2014 13:57 11/20/2014 09:00 12/03/2014 07:57 01/07/2015 10:21 04/07/2015 11:21 07/09/2015 08:47 10/01/2015 08:25 12/07/2015 08:42 04/03/2017 09:51 09/28/2017 09:38 01/01/2018 08:51 07/09/2018 12:49  FVC-Pre Latest Units: L 1.99 2.22 2.11 2.20 2.25 2.26 2.35 2.38 2.20 2.38 2.38 2.33  FVC-%Pred-Pre Latest Units: % 67 75 72 75 77 78 81 82 77 81  81 79     Results for Erin Li, Erin Li (MRN [id number]) as of 07/09/2018 13:55  Ref. Range 08/07/2014 13:57 11/20/2014 09:00 12/03/2014 07:57 01/07/2015 10:21 04/07/2015 11:21 07/09/2015 08:47 10/01/2015 08:25 12/07/2015 08:42 04/03/2017 09:51 09/28/2017 09:38 01/01/2018 08:51 07/09/2018 12:49  DLCO unc Latest Units: ml/min/mmHg 14.86        11.50 12.59 20.54 12.18  DLCO unc % pred Latest Units: % 61        47 49 80 47     Simple office walk 185 feet x  3 laps goal with forehead probe 07/09/2018   O2 used none  Number laps completed  3  Comments about pace normal  Resting Pulse Ox/HR 99% and /min  Final Pulse Ox/HR 94% and 100/min  Desaturated </= 88% no  Desaturated <= 3% points yes  Got Tachycardic >/= 90/min yes  Symptoms at end of test x  Miscellaneous comments X        ROS - per HPI     has a past medical history of Coronary artery disease, Diverticulosis, HTN (hypertension), and Lung nodule.   reports that she quit smoking about 6 years ago. Her smoking use included cigarettes. She has a 37.50 pack-year smoking history. She has never used smokeless tobacco.  Past Surgical History:  Procedure Laterality Date  . CARDIAC CATHETERIZATION    . CORONARY ANGIOPLASTY    . TONSILLECTOMY AND ADENOIDECTOMY      No Known Allergies  Immunization History  Administered Date(s) Administered  . Influenza Split 06/04/2013, 05/05/2014  . Influenza Whole 05/28/2012  . Influenza, High Dose Seasonal PF 06/05/2016, 06/04/2017, 05/20/2018  . Influenza,inj,Quad PF,6+ Mos 06/05/2015  . Pneumococcal Conjugate-13 01/26/2015  . Pneumococcal Polysaccharide-23 09/04/2005, 09/24/2012  . Tdap 03/20/2012    Family History  Problem Relation Age of Onset  . Heart attack Father   . Kidney disease Father   . COPD Mother   . Lung cancer Brother 83  . Pulmonary fibrosis Son        had lung transplant     Current Outpatient Medications:  .  albuterol (PROVENTIL HFA;VENTOLIN HFA) 108 (90 BASE) MCG/ACT inhaler, Inhale 2 puffs into the lungs every 6 (six) hours as needed., Disp: , Rfl:  .  Ascorbic Acid (VITAMIN C) 1000 MG tablet, Take 1,000 mg by mouth daily., Disp: , Rfl:  .  atorvastatin (LIPITOR) 10 MG tablet, , Disp: , Rfl:  .  b complex vitamins tablet, Take 1 tablet by mouth daily.,  Disp: , Rfl:  .  Biotin 1 MG CAPS, Take by mouth daily., Disp: , Rfl:  .  BREO ELLIPTA 100-25 MCG/INH AEPB, USE 1 INHALATION BY MOUTH  DAILY, Disp: 180 each, Rfl: 1 .  calcium-vitamin D 250-100 MG-UNIT per tablet, Take 1 tablet by mouth  daily. , Disp: , Rfl:  .  diltiazem (CARDIZEM) 60 MG tablet, Take 60 mg by mouth 3 (three) times daily., Disp: , Rfl:  .  fish oil-omega-3 fatty acids 1000 MG capsule, Take 2 g by mouth daily., Disp: , Rfl:  .  glucosamine-chondroitin 500-400 MG tablet, Take 1 tablet by mouth every morning., Disp: , Rfl:  .  ibuprofen (ADVIL,MOTRIN) 200 MG tablet, Take 400 mg by mouth 2 (two) times daily., Disp: , Rfl:  .  magnesium 30 MG tablet, Take 30 mg by mouth 2 (two) times daily. 1 tabs daily (dose unknown), Disp: , Rfl:  .  selenium 50 MCG TABS, Take 50 mcg by mouth daily., Disp: , Rfl:  .  senna (SENOKOT) 8.6 MG tablet, Take 1 tablet by mouth daily., Disp: , Rfl:  .  traMADol (ULTRAM) 50 MG tablet, Take 1 tablet (50 mg total) by mouth every 6 (six) hours as needed., Disp: 6 tablet, Rfl: 0 .  vitamin B-12 (CYANOCOBALAMIN) 1000 MCG tablet, Take 1,000 mcg by mouth daily., Disp: , Rfl:  .  vitamin E 200 UNIT capsule, Take 200 Units by mouth daily., Disp: , Rfl:  .  doxycycline (VIBRA-TABS) 100 MG tablet, Take 1 tablet (100 mg total) by mouth 2 (two) times daily., Disp: 10 tablet, Rfl: 0 .  predniSONE (DELTASONE) 10 MG tablet, 4 tabs X1 day, 3 tabs X1 day, 2 tabs X1 day, 1 tab X1 day, 0.5 tab X1 day, then stop., Disp: 11 tablet, Rfl: 0      Objective:   Vitals:   07/09/18 1346  BP: 126/78  Pulse: 69  SpO2: 96%  Weight: 150 lb 6.4 oz (68.2 kg)  Height: 5\' 5"  (1.651 m)    Estimated body mass index is 25.03 kg/m as calculated from the following:   Height as of this encounter: 5\' 5"  (1.651 m).   Weight as of this encounter: 150 lb 6.4 oz (68.2 kg).  @WEIGHTCHANGE @  [employer]   07/09/18 1346  Weight: 150 lb 6.4 oz (68.2 kg)     Physical Exam  General Appearance:    Alert, cooperative, no distress, appears stated age - yes , Deconditioned looking - no , OBESE  - no, Sitting on Wheelchair -  n  Head:    Normocephalic, without obvious abnormality, atraumatic  Eyes:    PERRL,  conjunctiva/corneas clear,  Ears:    Normal TM's and external ear canals, both ears  Nose:   Nares normal, septum midline, mucosa normal, no drainage    or sinus tenderness. OXYGEN ON  - no . Patient is @ ra   Throat:   Lips, mucosa, and tongue normal; teeth and gums normal. Cyanosis on lips - no  Neck:   Supple, symmetrical, trachea midline, no adenopathy;    thyroid:  no enlargement/tenderness/nodules; no carotid   bruit or JVD  Back:     Symmetric, no curvature, ROM normal, no CVA tenderness  Lungs:     Distress - no , Wheeze no, Barrell Chest - no, Purse lip breathing - no, Crackles -Velcro crackles at lung base  Chest Wall:    No tenderness or deformity.    Heart:    Regular  rate and rhythm, S1 and S2 normal, no rub   or gallop, Murmur - no  Breast Exam:    NOT DONE  Abdomen:     Soft, non-tender, bowel sounds active all four quadrants,    no masses, no organomegaly. Visceral obesity - no  Genitalia:   NOT DONE  Rectal:   NOT DONE  Extremities:   Extremities - normal, Has Cane - no, Clubbing - yes mild, Edema - no  Pulses:   2+ and symmetric all extremities  Skin:   Stigmata of Connective Tissue Disease - no  Lymph nodes:   Cervical, supraclavicular, and axillary nodes normal  Psychiatric:  Neurologic:   Pleasant - yes, Anxious - no, Flat affect - no.   CAm-ICU - neg, Alert and Oriented x 3 - yes, Moves all 4s - yes, Speech - normal, Cognition - intact           Assessment:       ICD-10-CM   1. COPD with acute exacerbation ([facility]) J44.1   2. Pulmonary emphysema with fibrosis of lung ([place] [facility]) J43.9    J84.10   3. ILD (interstitial lung disease) ([facility]) J84.9        Plan:     Patient Instructions     ICD-10-CM   1. COPD with acute exacerbation ([facility]) J44.1   2. Pulmonary emphysema with fibrosis of lung ([facility]) J43.9    J84.10   3. ILD (interstitial lung disease) ([facility]) J84.9      COPD :   -Mild exacerbation - Please take prednisone 40 mg x1 day, then 30 mg x1 day, then  20 mg x1 day, then 10 mg x1 day, and then 5 mg x1 day and stop - Take doxycycline 100mg  po twice daily x 5 days; take after meals and avoid sunlight  ILD  -Actually this is not better but still present and it is stable and mild -Given the fact your son had pulmonary fibrosis I think a biopsy would be recommended but given the fact it is stable and the fact he recently died and you going through grief we will hold off   Follow-up -In 4 months do spirometry and DLCO -Return to ILD clinic or regular clinic in 4 months but after spirometry and DLCO    > 50% of this > 25 min visit spent in face to face counseling or coordination of care - by this undersigned MD - Dr Brand Males. This includes one or more of the following documented above: discussion of test results, diagnostic or treatment recommendations, prognosis, risks and benefits of management options, instructions, education, compliance or risk-factor reduction   SIGNATURE    Dr. Brand Males, M.D., F.C.C.P,  Pulmonary and Critical Care Medicine Staff Physician, [place] Director - Interstitial Lung Disease  Program  P[facility] [place], [place], [address]  Pager: [phone], If no answer or between  15:00h - 7:00h: call [phone] Telephone: [phone]  2:15 PM 07/09/2018

## 2018-09-10 ENCOUNTER — Telehealth: Payer: Self-pay | Admitting: Internal Medicine

## 2018-09-10 MED ORDER — DOXYCYCLINE HYCLATE 100 MG PO TABS: 100.0000 mg | ORAL_TABLET | Freq: Two times a day (BID) | ORAL | 0 refills | Status: DC

## 2018-09-10 MED ORDER — PREDNISONE 10 MG PO TABS: ORAL_TABLET | ORAL | 0 refills | Status: DC

## 2018-09-10 NOTE — Telephone Encounter (Signed)
Called and spoke with patient she is aware and verbalized understanding. Orders sent in. Nothing further needed.

## 2018-09-10 NOTE — Telephone Encounter (Signed)
aecopd   Plan - Take doxycycline 100mg  po twice daily x 5 days; take after meals and avoid sunlight  - Please take prednisone 40 mg x1 day, then 30 mg x1 day, then 20 mg x1 day, then 10 mg x1 day, and then 5 mg x1 day and stop (take prednisone if she feels she will benefit from this)   No Known Allergies

## 2018-09-10 NOTE — Telephone Encounter (Signed)
Called and spoke with Patient.  She stated that she has had productive cough, with thick, white sputum. Denies fever, or SHOB.  She declined a OV, with NP, but would like MR to send her a antibiotic to pharmacy.

## 2018-09-25 DIAGNOSIS — F419 Anxiety disorder, unspecified: Secondary | ICD-10-CM | POA: Diagnosis not present

## 2018-09-25 DIAGNOSIS — I10 Essential (primary) hypertension: Secondary | ICD-10-CM | POA: Diagnosis not present

## 2018-09-25 DIAGNOSIS — M810 Age-related osteoporosis without current pathological fracture: Secondary | ICD-10-CM | POA: Diagnosis not present

## 2018-09-27 ENCOUNTER — Ambulatory Visit (INDEPENDENT_AMBULATORY_CARE_PROVIDER_SITE_OTHER)
Admission: RE | Admit: 2018-09-27 | Discharge: 2018-09-27 | Disposition: A | Discharge status: Home / Self Care | Payer: Medicare Other | Source: Ambulatory Visit | Attending: Acute Care | Admitting: Acute Care

## 2018-09-27 DIAGNOSIS — Z87891 Personal history of nicotine dependence: Secondary | ICD-10-CM | POA: Diagnosis not present

## 2018-09-27 DIAGNOSIS — Z122 Encounter for screening for malignant neoplasm of respiratory organs: Secondary | ICD-10-CM | POA: Diagnosis not present

## 2018-09-27 DIAGNOSIS — J439 Emphysema, unspecified: Secondary | ICD-10-CM | POA: Diagnosis not present

## 2018-10-02 DIAGNOSIS — R4589 Other symptoms and signs involving emotional state: Secondary | ICD-10-CM | POA: Diagnosis not present

## 2018-10-02 DIAGNOSIS — Z634 Disappearance and death of family member: Secondary | ICD-10-CM | POA: Diagnosis not present

## 2018-10-02 DIAGNOSIS — Z0000 Encounter for general adult medical examination without abnormal findings: Secondary | ICD-10-CM | POA: Diagnosis not present

## 2018-10-02 DIAGNOSIS — I10 Essential (primary) hypertension: Secondary | ICD-10-CM | POA: Diagnosis not present

## 2018-10-02 DIAGNOSIS — E7800 Pure hypercholesterolemia, unspecified: Secondary | ICD-10-CM | POA: Diagnosis not present

## 2018-10-02 DIAGNOSIS — F4321 Adjustment disorder with depressed mood: Secondary | ICD-10-CM | POA: Diagnosis not present

## 2018-10-04 ENCOUNTER — Other Ambulatory Visit: Payer: Self-pay | Admitting: Acute Care

## 2018-10-04 DIAGNOSIS — Z122 Encounter for screening for malignant neoplasm of respiratory organs: Secondary | ICD-10-CM

## 2018-10-04 DIAGNOSIS — Z87891 Personal history of nicotine dependence: Secondary | ICD-10-CM

## 2018-10-09 DIAGNOSIS — Z1212 Encounter for screening for malignant neoplasm of rectum: Secondary | ICD-10-CM | POA: Diagnosis not present

## 2018-10-09 DIAGNOSIS — Z1211 Encounter for screening for malignant neoplasm of colon: Secondary | ICD-10-CM | POA: Diagnosis not present

## 2019-02-12 ENCOUNTER — Other Ambulatory Visit: Payer: Self-pay | Admitting: Internal Medicine

## 2019-04-24 DIAGNOSIS — E7800 Pure hypercholesterolemia, unspecified: Secondary | ICD-10-CM | POA: Diagnosis not present

## 2019-05-01 DIAGNOSIS — M810 Age-related osteoporosis without current pathological fracture: Secondary | ICD-10-CM | POA: Diagnosis not present

## 2019-05-01 DIAGNOSIS — J449 Chronic obstructive pulmonary disease, unspecified: Secondary | ICD-10-CM | POA: Diagnosis not present

## 2019-05-01 DIAGNOSIS — I10 Essential (primary) hypertension: Secondary | ICD-10-CM | POA: Diagnosis not present

## 2019-05-01 DIAGNOSIS — E7800 Pure hypercholesterolemia, unspecified: Secondary | ICD-10-CM | POA: Diagnosis not present

## 2019-05-01 DIAGNOSIS — Z23 Encounter for immunization: Secondary | ICD-10-CM | POA: Diagnosis not present

## 2019-05-01 DIAGNOSIS — F4321 Adjustment disorder with depressed mood: Secondary | ICD-10-CM | POA: Diagnosis not present

## 2019-05-09 ENCOUNTER — Other Ambulatory Visit: Payer: Self-pay | Admitting: Internal Medicine

## 2019-05-23 ENCOUNTER — Telehealth: Payer: Self-pay | Admitting: Internal Medicine

## 2019-05-23 DIAGNOSIS — J439 Emphysema, unspecified: Secondary | ICD-10-CM

## 2019-05-23 DIAGNOSIS — J849 Interstitial pulmonary disease, unspecified: Secondary | ICD-10-CM

## 2019-05-23 NOTE — Telephone Encounter (Signed)
Last seen nov 2019 and plan was for spiro and dlco in 4 months. She did have Jan 2020 LDCT which recommended 1  Year followup of nodule, emphysema.ILD  Plan She can come now with spiro and dlco or if well wait till end of jan 2020 and come with HRCT for ILD and nodule and spiro/ldco at at time

## 2019-05-23 NOTE — Telephone Encounter (Signed)
Called the patient and made her aware of Dr. Golden Pop recommendations. She agreed to have the testing done in January and see him in office to discuss the results.  Tests ordered and recall placed for the patient to be seen by Dr. Chase Caller in January 2021 to discuss there results.  Nothing further needed at this time.

## 2019-07-13 ENCOUNTER — Other Ambulatory Visit: Payer: Self-pay | Admitting: Cardiology

## 2019-08-07 IMAGING — CT CT CHEST LUNG CANCER SCREENING LOW DOSE W/O CM
2 of 4 series · 15 of 40 positions shown, 18 images · non-contrast
Comparison: 03/30/2017 high-resolution chest CT. 03/02/2017
screening chest CT.

CLINICAL DATA: 76-year-old asymptomatic female former smoker with
37.5 pack-year smoking history, quit smoking in 0217.

EXAM:
CT CHEST WITHOUT CONTRAST LOW-DOSE FOR LUNG CANCER SCREENING
TECHNIQUE: Multidetector CT imaging of the chest was performed following the
standard protocol without IV contrast.

[Series 2: thorax 5.0 i31f 3 · axial · 0.71mm/px · z∈[-319,-54]mm · 12 of 63 slices shown, 15 images]
[im 5/63  mediastinal]
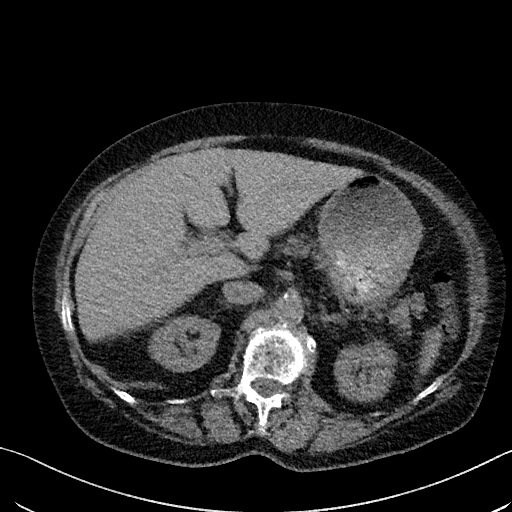
[im 5/63  lung]
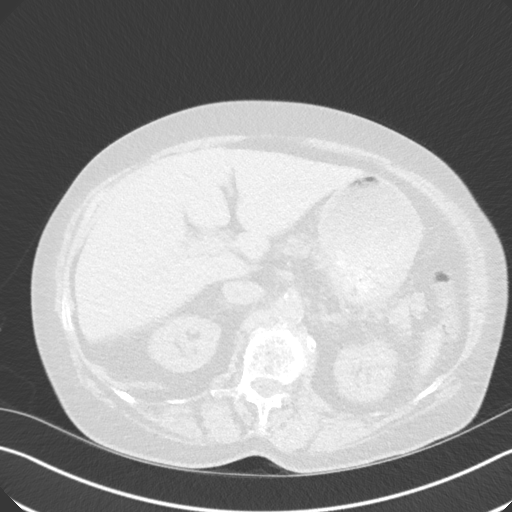
[im 10/63  lung]
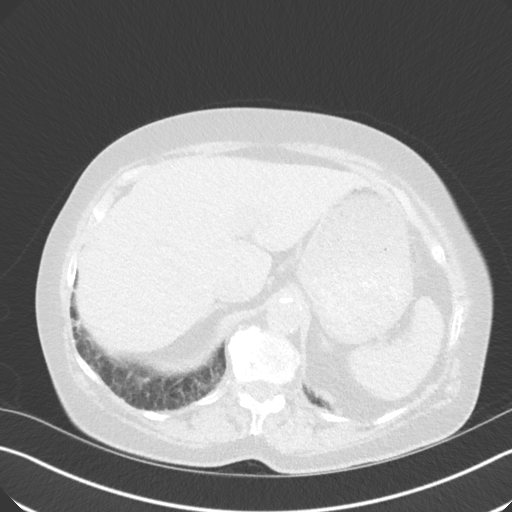
[im 15/63  lung]
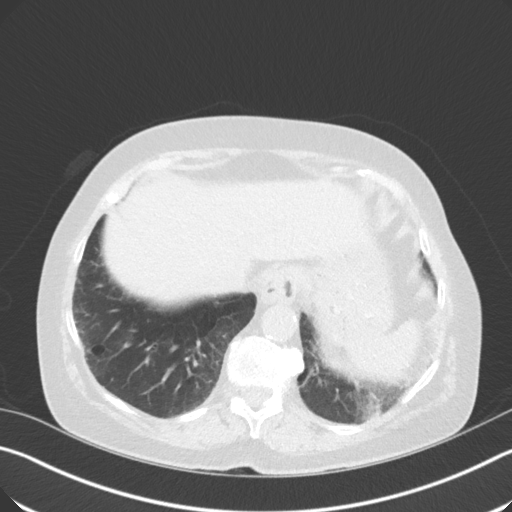
[im 20/63  lung]
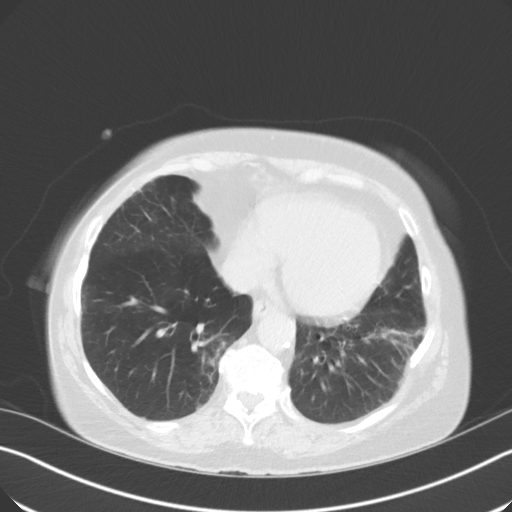
[im 24/63  mediastinal]
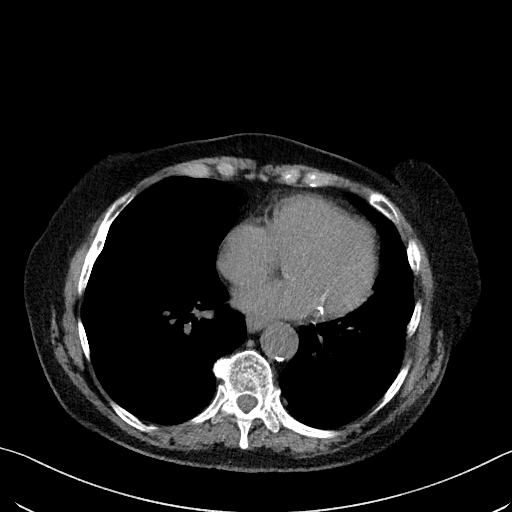
[im 24/63  lung]
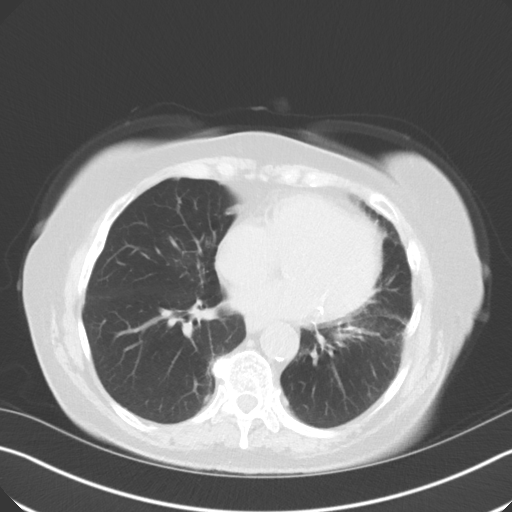
[im 29/63  lung]
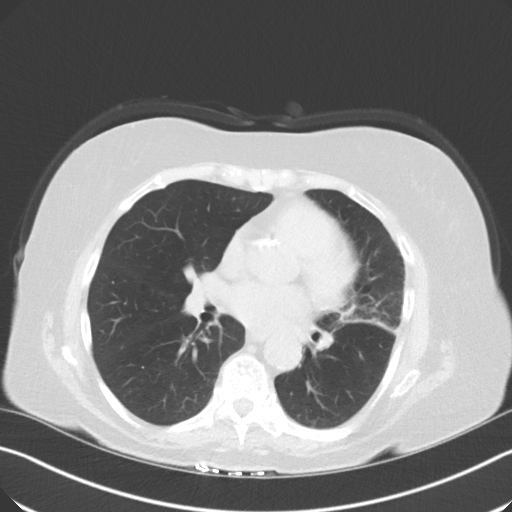
[im 34/63  lung]
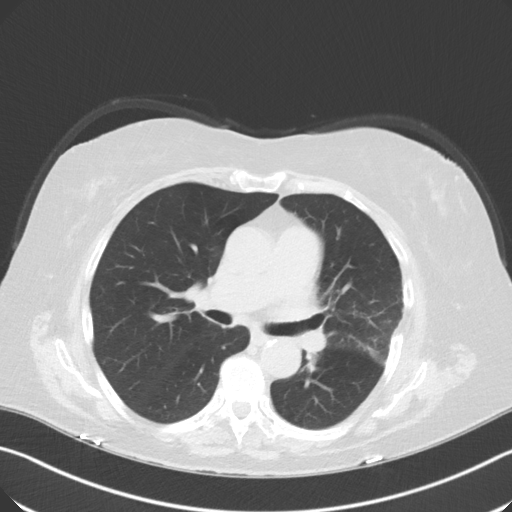
[im 39/63  lung]
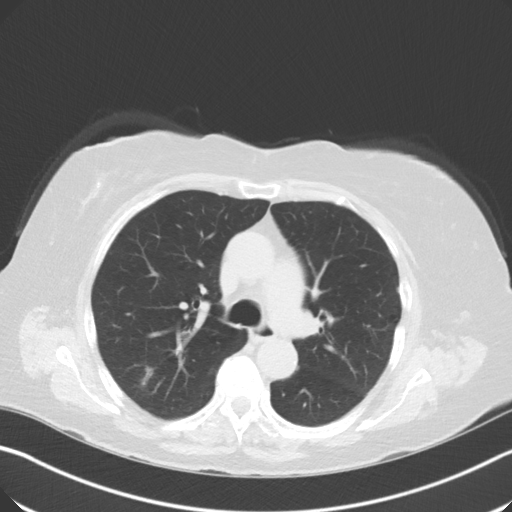
[im 43/63  mediastinal]
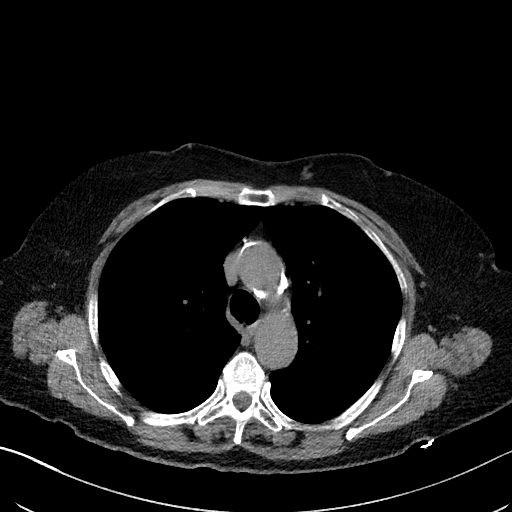
[im 43/63  lung]
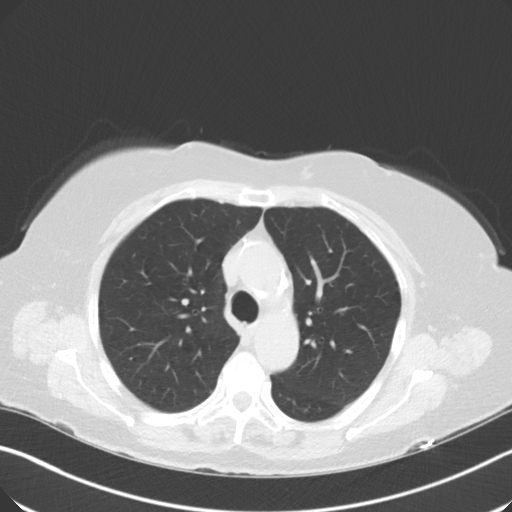
[im 48/63  lung]
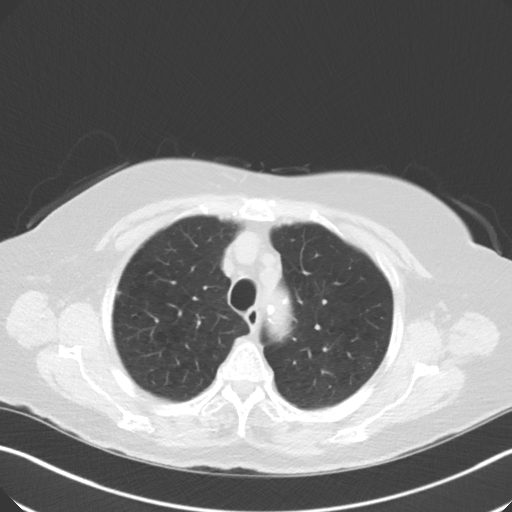
[im 53/63  lung]
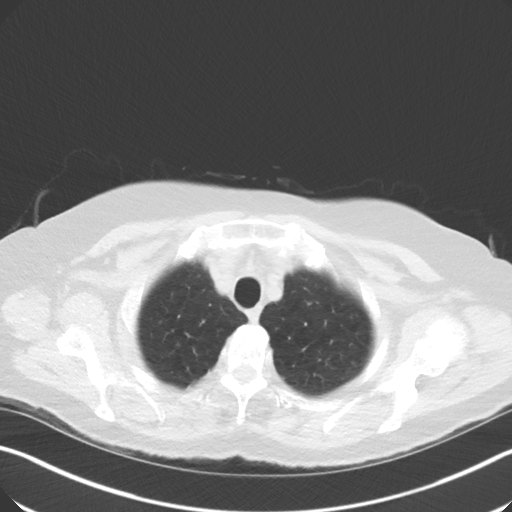
[im 58/63  lung]
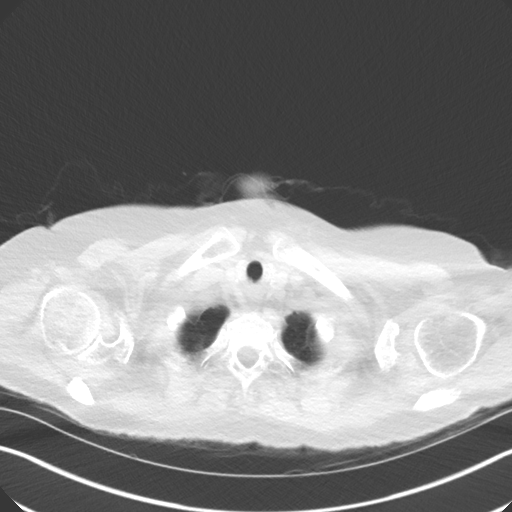

[Series 5: coronal · coronal · 0.59mm/px · 3 of 108 slices shown]
[im 22/108  lung]
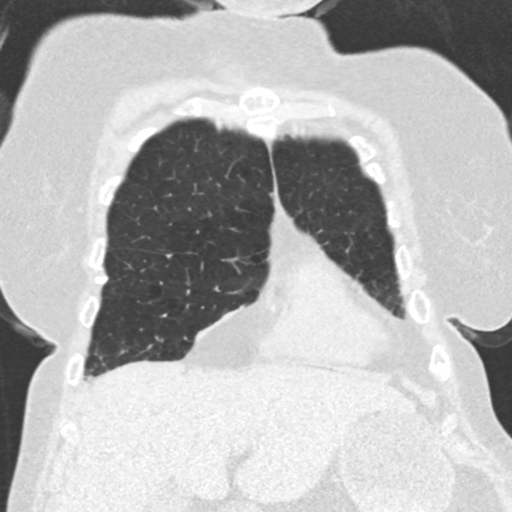
[im 43/108  lung]
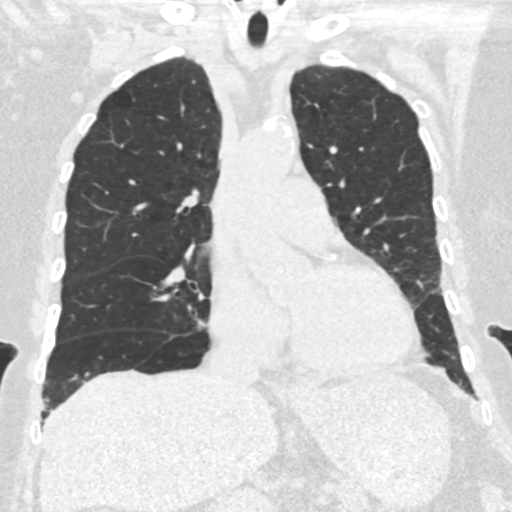
[im 65/108  lung]
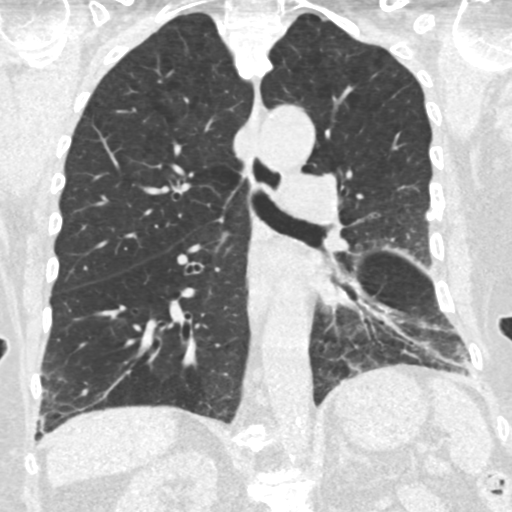

[15 of 40 positions shown; findings below may reference images not displayed]

FINDINGS: Cardiovascular: Normal heart size. No significant pericardial
effusion/thickening. Left anterior descending and right coronary
atherosclerosis. Atherosclerotic nonaneurysmal thoracic aorta.
Normal caliber pulmonary arteries.

Mediastinum/Nodes: No discrete thyroid nodules. Unremarkable
esophagus. No pathologically enlarged axillary, mediastinal or hilar
lymph nodes, noting limited sensitivity for the detection of hilar
adenopathy on this noncontrast study.

Lungs/Pleura: No pneumothorax. No pleural effusion. Moderate
centrilobular emphysema. Patchy subpleural reticulation and
ground-glass attenuation in both lungs with a basilar predominance,
not definitely changed. No acute consolidative airspace disease or
lung masses. No significant growth of any of the previously
visualized scattered pulmonary nodules. New posterior right upper
lobe solid pulmonary nodule measuring 5.4 mm in volume derived mean
diameter (series 3/image 118).

Upper abdomen: Small hiatal hernia. Probable tiny calcified
gallstones in the gallbladder neck.

Musculoskeletal: No aggressive appearing focal osseous lesions. Mild
thoracic spondylosis. Healed bilateral rib deformities.
IMPRESSION: 1. Lung-RADS 3, probably benign findings. Short-term follow-up in 6
months is recommended with repeat low-dose chest CT without contrast
(please use the following order, "CT CHEST LCS NODULE FOLLOW-UP W/O
CM").
2. Two vessel coronary atherosclerosis.
3. Small hiatal hernia.

Aortic Atherosclerosis (BV2Q3-WNG.G) and Emphysema (BV2Q3-MYF.3).

## 2019-08-27 ENCOUNTER — Other Ambulatory Visit: Payer: Self-pay | Admitting: Internal Medicine

## 2019-09-23 ENCOUNTER — Ambulatory Visit
Admission: RE | Admit: 2019-09-23 | Discharge: 2019-09-23 | Disposition: A | Discharge status: Home / Self Care | Payer: Medicare Other | Source: Ambulatory Visit | Attending: Internal Medicine | Admitting: Internal Medicine

## 2019-09-23 DIAGNOSIS — J439 Emphysema, unspecified: Secondary | ICD-10-CM | POA: Diagnosis not present

## 2019-09-23 DIAGNOSIS — J849 Interstitial pulmonary disease, unspecified: Secondary | ICD-10-CM

## 2019-09-25 DIAGNOSIS — M79671 Pain in right foot: Secondary | ICD-10-CM | POA: Diagnosis not present

## 2019-09-25 DIAGNOSIS — M84371A Stress fracture, right ankle, initial encounter for fracture: Secondary | ICD-10-CM | POA: Diagnosis not present

## 2019-09-25 NOTE — Progress Notes (Signed)
NsIP/ILD and emphysema - stable on CT and nodules. Will discuss feb 2021 visit

## 2019-09-26 ENCOUNTER — Telehealth: Payer: Self-pay | Admitting: Internal Medicine

## 2019-09-26 NOTE — Telephone Encounter (Signed)
NsIP/ILD and emphysema - stable on CT and nodules. Will discuss feb 2021 visit   Spoke with pt, aware of results/recs.  Nothing further needed.

## 2019-10-08 ENCOUNTER — Other Ambulatory Visit: Payer: Self-pay

## 2019-10-08 ENCOUNTER — Encounter: Payer: Self-pay | Admitting: Internal Medicine

## 2019-10-08 ENCOUNTER — Ambulatory Visit (INDEPENDENT_AMBULATORY_CARE_PROVIDER_SITE_OTHER): Payer: Medicare Other | Admitting: Internal Medicine

## 2019-10-08 VITALS — BP 122/68 | HR 68 | Ht 65.0 in | Wt 142.4 lb

## 2019-10-08 DIAGNOSIS — J8410 Pulmonary fibrosis, unspecified: Secondary | ICD-10-CM | POA: Diagnosis not present

## 2019-10-08 DIAGNOSIS — J849 Interstitial pulmonary disease, unspecified: Secondary | ICD-10-CM

## 2019-10-08 DIAGNOSIS — J439 Emphysema, unspecified: Secondary | ICD-10-CM | POA: Diagnosis not present

## 2019-10-08 MED ORDER — SPIRIVA RESPIMAT 1.25 MCG/ACT IN AERS: 2.0000 | INHALATION_SPRAY | Freq: Every day | RESPIRATORY_TRACT | 3 refills | Status: DC

## 2019-10-08 MED ORDER — BREO ELLIPTA 100-25 MCG/INH IN AEPB: INHALATION_SPRAY | RESPIRATORY_TRACT | 1 refills | Status: DC

## 2019-10-08 MED ORDER — SPIRIVA RESPIMAT 1.25 MCG/ACT IN AERS: 2.0000 | INHALATION_SPRAY | Freq: Every day | RESPIRATORY_TRACT | 0 refills | Status: DC

## 2019-10-08 NOTE — Addendum Note (Signed)
Addended by: Lorretta Harp on: 10/08/2019 12:03 PM   Modules accepted: Orders

## 2019-10-08 NOTE — Patient Instructions (Addendum)
ICD-10-CM   1. ILD (interstitial lung disease) ([facility])  J84.9   2. 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

## 2019-10-08 NOTE — Progress Notes (Signed)
OV 04/10/2017   Chief Complaint  Patient presents with  . Follow-up    Pt states her breathing is unchanged since last OV. Pt states she has mild non prod cough. Pt denies CP/tightness, f/c/s, and chest congestion.    Follow-up COPD but now has concerned that she has developed interstitial lung disease  She is here with her husband. Back in 2015 a high-resolution CT chest because I thought I heard crackles and thoracic radiology ruled out interstitial lung disease. In the interim she's had lung cancer screening CT chest at overall stability in her pulmonary function test. But there was concern because of crackles that she might have interstitial lung disease so we did a high-resolution CT chest and July 2018 and thoracic radiologist reported as definite precedence of ILD although it is not a UIP pattern. [name] She does say that her son and over the lung transplant in [place] 8 years ago at age of 37 because of IPF. She did have autoimmune panel that is only trace positive for ENP . 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 She denies any humidifier or mildew or mold in the house. The CT chest also shows coronary artery calcification. Of note I personally visualized the CT chest.  ACCP ILD questiin  - symptioms: occ cough x 2 years with some night cough and mild phleogn ona nd off. Very mild non troubling dyspnea x 3 years - pasth medical : GERD +, mild dysphagia + - personal exposure x -: +ver for remote use of rec / street drugs. Cig smoked age 28 though 28 at 68 cigs/dat - fam hx: COPD +, son with transplant for ?  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 Has a Macaw bird x > 20 years - residence: [place], [place], [place], [place], [place] - occupatin: bookeeper - pulmotiox hx: negagive  Results for Erin Li (MRN [id number]) as  of 04/10/2017 11:21  Ref. Range 04/03/2017 10:18  CK Total Latest Ref Range: 7 - 177 U/L 63  CK-MB Latest Ref Range: 0.3 - 4.0 ng/mL 2.4  Aldolase Latest Ref Range: <=8.1 U/L 3.5  Sed Rate Latest Ref Range: 0 - 30 mm/hr 20  Anit Nuclear Antibody(ANA) Latest Ref Range: NEGATIVE  NEG  Angiotensin-Converting Enzyme Latest Ref Range: 9 - 67 U/L 67  Cyclic Citrullin Peptide Ab Latest Units: Units <16  ds DNA Ab Latest Units: IU/mL 1  ENA RNP Ab Latest Ref Range: 0.0 - 0.9 AI 1.4 (H)  RA Latex Turbid. Latest Ref Range: <14 IU/mL <14  SSA (Ro) (ENA) Antibody, IgG Latest Ref Range: <1.0 NEG AI  <1.0 NEG  SSB (La) (ENA) Antibody, IgG Latest Ref Range: <1.0 NEG AI  <1.0 NEG  Scleroderma (Scl-70) (ENA) Antibody, IgG Latest Ref Range: <1.0 NEG AI  <1.0 NEG    OV 09/28/2017  Chief Complaint  Patient presents with  . Follow-up    PFT done today. States she has been doing good and has no complaints of cough, SOB, or CP.    Fu mixed empyhsema and pulmonary fibrosis  For  years of only known who is a COPD patient with a family history of pulmonary fibrosis.  But it appears that in 2018 she started developing signs of pulmonary fibrosis with crackles  and findings of indeterminate UIP on CT chest July 2018.  She has family history of pulmonary fibrosis and that her son had lung transplant for the same.  She now presents for follow-up.  In the interim she started feeling better.  She is less dyspneic.  She continues with the Brio.  There are no new issues.  Pulmonary function test done as documented below shows an improvement.  Therefore instead of biopsy she feels keeping an eye on this is a better approach.    Walking desaturation test on 09/28/2017 185 feet x 3 laps on ROOM AIR:  did NOT desaturate. Rest pulse ox was 99%, final pulse ox was96 %. HR response 78 /min at rest to 105/min at peak exertion. Patient Erin Li  Did nto Desaturate < 88% . Erin Li yes  Desaturated </= 3% points.  Erin Li yes did get tachyardic   OV 01/01/2018  Chief Complaint  Patient presents with  . Follow-up    PFT done today.  Pt states she has been doing good since last visit. Had a cold mid January 2019 but has been better since. Denies any complaints of cough, SOB, or CP.   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 She continues on Brio. Since last visit January 2019 she continues to feel better. COPD cat score is very minimal as documented below. She had pulmonary function test today and this shows stability in her FVC after initial improvement. Her DLCO is even better. She is on observation course regarding her ILD given the improvement in her pulmonary function test. Her last CT scan of the chest was July 2018        OV 07/09/2018  Subjective:  Patient ID: Erin Li, female , DOB: 03-19-1941 , age 79 y.o. , MRN: [id number] , ADDRESS: [place] [id number]   07/09/2018 -   Chief Complaint  Patient presents with  . Consult    Alot of coughing producing mucus no color at this time.     HPI Erin Li 79 y.o. 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  She tells me that for the last 3 weeks has had increased cough with congestion and white sputum more than baseline but activities of daily living and shortness of breath or guarding is all unchanged and she has good effort tolerance.  There are no new other issues.  In terms of ILD: Last visit I thought I will be in improved based on improvement in pulmonary function test but actually that might of been a red herring because today's pulmonary function test shows a decline which is in line with previous pulmonary function test showing stability.  She certainly feels stable.   In July 2019 she had a low-dose CT scan for lung cancer and this ruled out lung cancer on screening.  They were able to report some mildly changes at the base.  She is not interested in surgical lung biopsy because her son who had pulmonary fibrosis finally died from 80 years of transplant from pulmonary embolism, stroke and acute renal failure and chronic critical illness in the rehab facility.      OV 10/08/2019  Subjective:  Patient ID: Erin Li, female , DOB: [birth year] , age 79 y.o. ,  MRN: [id number] , ADDRESS: [place] [id number]   10/08/2019 -   Chief Complaint  Patient presents with  . Follow-up    Pt states she has been doing well since last visit and states her breathing has been doing well. Pt will have an occ cough.   predominantly COPD with some amount of basal pulmonary fibrosis  N(atlernate to UIP)  in the setting of her son having pulmonary fibrosis alt- decieasedd 2019    HPI Erin Li 79 y.o. 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  Last seen in #2019.  She presents with her husband.  Since the onset of the COVID-19 pandemic there have been in the house and socially isolated.  She has been doing well without any problems overall stable.  She not exercising as much as she used to.  She not taking any inhalers for her COPD.  She only takes Mucinex.  Therefore her cough symptom with mucus is slightly worse than before.  She had high-resolution CT scan of the chest which I reviewed the report and agree with the findings and I interpreted as well with visualization: She has emphysema associated with alternate diagnosis pattern.  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  She still grieving from her son who died in 2018/06/18 from pulmonary fibrosis.    CAT COPD Symptom & Quality of Life Score (GSK trademark) 0 is no burden. 5 is highest burden 01/01/2018  10/08/2019   Never Cough -> Cough all the time 2 2  No  phlegm in chest -> Chest is full of phlegm 2 4  No chest tightness -> Chest feels very tight 0 0  No dyspnea for 1 flight stairs/hill -> Very dyspneic for 1 flight of stairs 2 1  No limitations for ADL at home -> Very limited with ADL at home 0 0  Confident leaving home -> Not at all confident leaving home 0 0  Sleep soundly -> Do not sleep soundly because of lung condition 0 0  Lots of Energy -> No energy at all 0 0  TOTAL Score (max 40)  6 7    Results for Erin Li, Erin Li (MRN [id number]) as of 07/09/2018 13:55  Ref. Range 08/07/2014 13:57 11/20/2014 09:00 12/03/2014 07:57 01/07/2015 10:21 04/07/2015 11:21 07/09/2015 08:47 10/01/2015 08:25 12/07/2015 08:42 04/03/2017 09:51 09/28/2017 09:38 01/01/2018 08:51 07/09/2018 12:49  FVC-Pre Latest Units: L 1.99 2.22 2.11 2.20 2.25 2.26 2.35 2.38 2.20 2.38 2.38 2.33  FVC-%Pred-Pre Latest Units: % 67 75 72 75 77 78 81 82 77 81  81 79     Results for Erin Li, Erin Li (MRN [id number]) as of 07/09/2018 13:55  Ref. Range 08/07/2014 13:57 11/20/2014 09:00 12/03/2014 07:57 01/07/2015 10:21 04/07/2015 11:21 07/09/2015 08:47 10/01/2015 08:25 12/07/2015 08:42 04/03/2017 09:51 09/28/2017 09:38 01/01/2018 08:51 07/09/2018 12:49  DLCO unc Latest Units: ml/min/mmHg 14.86        11.50 12.59 20.54 12.18  DLCO unc % pred Latest Units: % 61        47 49 80 47     Simple office walk 185 feet x  3 laps goal with forehead probe 07/09/2018   O2 used none  Number laps completed 3  Comments about pace normal  Resting Pulse Ox/HR 99% and /min  Final Pulse Ox/HR 94% and 100/min  Desaturated </= 88% no  Desaturated <= 3% points yes  Got Tachycardic >/= 90/min yes  Symptoms at end of test x  Miscellaneous comments X  ROS - per HPI     has a past medical history of Coronary artery disease, Diverticulosis, HTN (hypertension), and Lung nodule.   reports that she quit smoking about 7 years ago. Her smoking use included cigarettes. She has a 37.50 pack-year smoking history. She has never  used smokeless tobacco.  Past Surgical History:  Procedure Laterality Date  . CARDIAC CATHETERIZATION    . CORONARY ANGIOPLASTY    . TONSILLECTOMY AND ADENOIDECTOMY      No Known Allergies  Immunization History  Administered Date(s) Administered  . Influenza Split 06/04/2013, 05/05/2014  . Influenza Whole 05/28/2012  . Influenza, High Dose Seasonal PF 06/05/2016, 06/04/2017, 05/20/2018, 05/06/2019  . Influenza,inj,Quad PF,6+ Mos 06/05/2015  . Pneumococcal Conjugate-13 01/26/2015  . Pneumococcal Polysaccharide-23 09/04/2005, 09/24/2012  . Tdap 03/20/2012    Family History  Problem Relation Age of Onset  . Heart attack Father   . Kidney disease Father   . COPD Mother   . Lung cancer Brother 70  . Pulmonary fibrosis Son        had lung transplant     Current Outpatient Medications:  .  Ascorbic Acid (VITAMIN C) 1000 MG tablet, Take 1,000 mg by mouth daily., Disp: , Rfl:  .  atorvastatin (LIPITOR) 10 MG tablet, TAKE 1 TABLET BY MOUTH  EVERY EVENING AFTER DINNER, Disp: 30 tablet, Rfl: 1 .  b complex vitamins tablet, Take 1 tablet by mouth daily., Disp: , Rfl:  .  Biotin 1 MG CAPS, Take by mouth daily., Disp: , Rfl:  .  BREO ELLIPTA 100-25 MCG/INH AEPB, USE 1 INHALATION BY MOUTH  DAILY, Disp: 180 each, Rfl: 0 .  calcium-vitamin D 250-100 MG-UNIT per tablet, Take 1 tablet by mouth daily. , Disp: , Rfl:  .  diltiazem (CARDIZEM) 60 MG tablet, Take 60 mg by mouth 3 (three) times daily., Disp: , Rfl:  .  fish oil-omega-3 fatty acids 1000 MG capsule, Take 2 g by mouth daily., Disp: , Rfl:  .  glucosamine-chondroitin 500-400 MG tablet, Take 1 tablet by mouth every morning., Disp: , Rfl:  .  ibuprofen (ADVIL,MOTRIN) 200 MG tablet, Take 400 mg by mouth 2 (two) times daily., Disp: , Rfl:  .  magnesium 30 MG tablet, Take 30 mg by mouth 2 (two) times daily. 1 tabs daily (dose unknown), Disp: , Rfl:  .  selenium 50 MCG TABS, Take 50 mcg by mouth daily., Disp: , Rfl:  .  senna (SENOKOT) 8.6  MG tablet, Take 1 tablet by mouth daily., Disp: , Rfl:  .  vitamin B-12 (CYANOCOBALAMIN) 1000 MCG tablet, Take 1,000 mcg by mouth daily., Disp: , Rfl:  .  vitamin E 200 UNIT capsule, Take 200 Units by mouth daily., Disp: , Rfl:  .  albuterol (PROVENTIL HFA;VENTOLIN HFA) 108 (90 BASE) MCG/ACT inhaler, Inhale 2 puffs into the lungs every 6 (six) hours as needed., Disp: , Rfl:       Objective:   Vitals:   10/08/19 1107  BP: 122/68  Pulse: 68  SpO2: 97%  Weight: 142 lb 6.4 oz (64.6 kg)  Height: 5\' 5"  (1.651 m)    Estimated body mass index is 23.7 kg/m as calculated from the following:   Height as of this encounter: 5\' 5"  (1.651 m).   Weight as of this encounter: 142 lb 6.4 oz (64.6 kg).  @WEIGHTCHANGE @  [employer]   10/08/19 1107  Weight: 142 lb 6.4 oz (64.6 kg)     Physical Exam Thin pleasant female alert  and oriented x3.  Speech is normal.  Bilateral crackles in the lung base but otherwise normal exam with normal heart sounds abdomen soft.        Assessment:       ICD-10-CM   1. ILD (interstitial lung disease) ([facility])  J84.9   2. Pulmonary emphysema with fibrosis of lung ([place] [facility])  J43.9    J84.10        Plan:     Patient Instructions     ICD-10-CM   1. ILD (interstitial lung disease) ([facility])  J84.9   2. 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 Brand Males, M.D., F.C.C.P,  Pulmonary and Critical Care Medicine Staff Physician, [place] [facility] Director - Interstitial Lung Disease  Program  [facility] [place], [place], [address]  Pager: [phone], If no answer or between  15:00h - 7:00h: call [phone] Telephone: [phone]  11:47 AM 10/08/2019

## 2019-10-08 NOTE — Addendum Note (Signed)
Addended by: Lorretta Harp on: 10/08/2019 12:44 PM   Modules accepted: Orders

## 2019-10-09 ENCOUNTER — Other Ambulatory Visit: Payer: Self-pay | Admitting: *Deleted

## 2019-10-09 DIAGNOSIS — Z87891 Personal history of nicotine dependence: Secondary | ICD-10-CM

## 2019-10-14 DIAGNOSIS — H353131 Nonexudative age-related macular degeneration, bilateral, early dry stage: Secondary | ICD-10-CM | POA: Diagnosis not present

## 2019-10-14 DIAGNOSIS — H02834 Dermatochalasis of left upper eyelid: Secondary | ICD-10-CM | POA: Diagnosis not present

## 2019-10-14 DIAGNOSIS — H35373 Puckering of macula, bilateral: Secondary | ICD-10-CM | POA: Diagnosis not present

## 2019-10-14 DIAGNOSIS — H43811 Vitreous degeneration, right eye: Secondary | ICD-10-CM | POA: Diagnosis not present

## 2019-10-14 DIAGNOSIS — H02831 Dermatochalasis of right upper eyelid: Secondary | ICD-10-CM | POA: Diagnosis not present

## 2019-10-14 DIAGNOSIS — D3132 Benign neoplasm of left choroid: Secondary | ICD-10-CM | POA: Diagnosis not present

## 2019-10-14 DIAGNOSIS — H26493 Other secondary cataract, bilateral: Secondary | ICD-10-CM | POA: Diagnosis not present

## 2019-10-14 DIAGNOSIS — Z961 Presence of intraocular lens: Secondary | ICD-10-CM | POA: Diagnosis not present

## 2019-10-14 DIAGNOSIS — H527 Unspecified disorder of refraction: Secondary | ICD-10-CM | POA: Diagnosis not present

## 2019-10-21 DIAGNOSIS — M84371A Stress fracture, right ankle, initial encounter for fracture: Secondary | ICD-10-CM | POA: Diagnosis not present

## 2019-10-21 DIAGNOSIS — M79671 Pain in right foot: Secondary | ICD-10-CM | POA: Diagnosis not present

## 2019-10-24 DIAGNOSIS — H26492 Other secondary cataract, left eye: Secondary | ICD-10-CM | POA: Diagnosis not present

## 2019-10-27 DIAGNOSIS — I10 Essential (primary) hypertension: Secondary | ICD-10-CM | POA: Diagnosis not present

## 2019-10-27 DIAGNOSIS — F4321 Adjustment disorder with depressed mood: Secondary | ICD-10-CM | POA: Diagnosis not present

## 2019-10-27 DIAGNOSIS — M810 Age-related osteoporosis without current pathological fracture: Secondary | ICD-10-CM | POA: Diagnosis not present

## 2019-11-03 DIAGNOSIS — I2510 Atherosclerotic heart disease of native coronary artery without angina pectoris: Secondary | ICD-10-CM | POA: Diagnosis not present

## 2019-11-03 DIAGNOSIS — Z0000 Encounter for general adult medical examination without abnormal findings: Secondary | ICD-10-CM | POA: Diagnosis not present

## 2019-11-03 DIAGNOSIS — E7800 Pure hypercholesterolemia, unspecified: Secondary | ICD-10-CM | POA: Diagnosis not present

## 2019-11-05 ENCOUNTER — Encounter: Payer: Self-pay | Admitting: Cardiology

## 2019-11-10 DIAGNOSIS — Z23 Encounter for immunization: Secondary | ICD-10-CM | POA: Diagnosis not present

## 2019-12-05 DIAGNOSIS — H26493 Other secondary cataract, bilateral: Secondary | ICD-10-CM | POA: Diagnosis not present

## 2019-12-08 DIAGNOSIS — Z23 Encounter for immunization: Secondary | ICD-10-CM | POA: Diagnosis not present

## 2019-12-10 ENCOUNTER — Encounter: Payer: Self-pay | Admitting: Orthopaedic Surgery

## 2019-12-10 ENCOUNTER — Ambulatory Visit: Payer: Self-pay

## 2019-12-10 ENCOUNTER — Other Ambulatory Visit: Payer: Self-pay

## 2019-12-10 ENCOUNTER — Ambulatory Visit (INDEPENDENT_AMBULATORY_CARE_PROVIDER_SITE_OTHER): Payer: Medicare Other | Admitting: Orthopaedic Surgery

## 2019-12-10 VITALS — Ht 64.0 in | Wt 135.0 lb

## 2019-12-10 DIAGNOSIS — M79671 Pain in right foot: Secondary | ICD-10-CM

## 2019-12-10 NOTE — Progress Notes (Signed)
Office Visit Note   Patient: Erin Li           Date of Birth: 09-20-1940           MRN: [id number] Visit Date: 12/10/2019              Requested by: [name], [address] [place],  [place] [address] PCP: [name], MD   Assessment & Plan: Visit Diagnoses:  1. Pain in right foot     Plan: Healed stress fracture second metatarsal right foot.  Erin Li has been wearing a shoe for at least a month to 6 weeks and at this point I think she can discontinue that.  She is having minimal symptoms.  Some of the swelling of her foot may be related to the strap that runs across the dorsum of her foot from the boot.  Not having much pain over the the fracture.  She is does have a mild hallux rigidus but that does not appear to be symptomatic.  We will plan to see her back as needed.  The fracture is healed by x-ray  Follow-Up Instructions: Return if symptoms worsen or fail to improve.   Orders:  Orders Placed This Encounter  Procedures  . XR Foot Complete Right   No orders of the defined types were placed in this encounter.     Procedures: No procedures performed   Clinical Data: No additional findings.   Subjective: Chief Complaint  Patient presents with  . Right Foot - Pain  Patient presents today for right foot pain. She said that she developed pain in January of 2021 in her right foot. No known injury. She saw Dr.Drake  On 10/02/2019 after it had been hurting for a couple weeks. She states that she was told she had a second metatarsal fracture and was placed into a shoe until mid March. She states that she continues to hurt. She is taking Aspirin for pain.  Although she still having some pain it is less and is able to weight-bear in the wooden shoe  HPI  Review of Systems   Objective: Vital Signs: Ht 5\' 4"  (1.626 m)   Wt 135 lb (61.2 kg)   LMP 09/04/1988 (Approximate)   BMI 23.17 kg/m   Physical Exam Constitutional:      Appearance:  She is well-developed.  Eyes:     Pupils: Pupils are equal, round, and reactive to light.  Pulmonary:     Effort: Pulmonary effort is normal.  Skin:    General: Skin is warm and dry.  Neurological:     Mental Status: She is alert and oriented to person, place, and time.  Psychiatric:        Behavior: Behavior normal.     Ortho Exam right foot exam reveals some very mild swelling distally from the midfoot dorsally probably from wearing the strap on the wooden shoe.  She has very mild loss of dorsiflexion of her great toe but no localized areas of tenderness.  There is a prominent bunion but no redness or pain.  There is very minimal tenderness over the second metatarsal in the area of her stress fracture of the distal third with no plantar pain.  No heel pain.  No Achilles discomfort.  Good pulses.  Neurologically intact  Specialty Comments:  No specialty comments available.  Imaging: XR Foot Complete Right  Result Date: 12/10/2019 Films of the right foot were obtained in several projections.  There is a  healed second metatarsal stress fracture with abundant callus in excellent position.  There is diffuse osteopenia.  There are degenerative changes of the first metatarsal phalangeal joint which is not symptomatic    PMFS History: Patient Active Problem List   Diagnosis Date Noted  . Pain in right foot 12/10/2019  . Elevated C-reactive protein (CRP) 01/18/2017  . COPD, moderate ([facility]) 08/07/2014  . Cough 01/13/2013  . COPD exacerbation ([place]) 12/11/2012  . COPD (chronic obstructive pulmonary disease) ([place]) 09/25/2012  . Lung nodule 07/14/2012  . Hx of smoking 07/14/2012   Past Medical History:  Diagnosis Date  . Coronary artery disease   . Diverticulosis   . HTN (hypertension)   . Lung nodule     Family History  Problem Relation Age of Onset  . Heart attack Father   . Kidney disease Father   . COPD Mother   . Lung cancer Brother 62  . Pulmonary fibrosis Son        had  lung transplant    Past Surgical History:  Procedure Laterality Date  . CARDIAC CATHETERIZATION    . CORONARY ANGIOPLASTY    . TONSILLECTOMY AND ADENOIDECTOMY     Social History   Occupational History  . Not on file  Tobacco Use  . Smoking status: Former Smoker    Packs/day: 0.75    Years: 50.00    Pack years: 37.50    Types: Cigarettes    Quit date: 11/11/2011    Years since quitting: 8.0  . Smokeless tobacco: Never Used  . Tobacco comment: Encouraged to remain smoke free  Substance and Sexual Activity  . Alcohol use: Yes  . Drug use: Not on file  . Sexual activity: Not on file

## 2020-03-12 ENCOUNTER — Other Ambulatory Visit: Payer: Self-pay | Admitting: Internal Medicine

## 2020-03-12 NOTE — Telephone Encounter (Signed)
Spoke with patient over phone to clarify if she was on breo or spiriva. Patient states she is on Breo but that she does not currently need [place] to refill it and that she does it herself online and currently has some Breo. Nothing further needed.

## 2020-04-03 ENCOUNTER — Other Ambulatory Visit (HOSPITAL_COMMUNITY): Payer: Medicare Other

## 2020-04-06 ENCOUNTER — Other Ambulatory Visit: Payer: Self-pay

## 2020-04-06 ENCOUNTER — Ambulatory Visit (INDEPENDENT_AMBULATORY_CARE_PROVIDER_SITE_OTHER): Payer: Medicare Other | Admitting: Internal Medicine

## 2020-04-06 DIAGNOSIS — J439 Emphysema, unspecified: Secondary | ICD-10-CM

## 2020-04-06 DIAGNOSIS — J8410 Pulmonary fibrosis, unspecified: Secondary | ICD-10-CM

## 2020-04-06 DIAGNOSIS — J849 Interstitial pulmonary disease, unspecified: Secondary | ICD-10-CM | POA: Diagnosis not present

## 2020-04-06 LAB — PULMONARY FUNCTION TEST
DL/VA % pred: 73 %
DL/VA: 2.98 ml/min/mmHg/L
DLCO cor % pred: 66 %
DLCO cor: 13.11 ml/min/mmHg
DLCO unc % pred: 66 %
DLCO unc: 13.11 ml/min/mmHg
FEF 25-75 Pre: 0.97 L/sec
FEF2575-%Pred-Pre: 60 %
FEV1-%Pred-Pre: 72 %
FEV1-Pre: 1.55 L
FEV1FVC-%Pred-Pre: 87 %
FEV6-%Pred-Pre: 86 %
FEV6-Pre: 2.35 L
FEV6FVC-%Pred-Pre: 104 %
FVC-%Pred-Pre: 83 %
FVC-Pre: 2.38 L
Pre FEV1/FVC ratio: 65 %
Pre FEV6/FVC Ratio: 99 %

## 2020-04-06 NOTE — Progress Notes (Signed)
Spiro/DLCO performed today. 

## 2020-04-08 NOTE — Progress Notes (Signed)
Continued stability of lung function. See you in sept

## 2020-04-13 ENCOUNTER — Other Ambulatory Visit: Payer: Self-pay | Admitting: Internal Medicine

## 2020-04-19 MED ORDER — BREO ELLIPTA 100-25 MCG/INH IN AEPB: INHALATION_SPRAY | RESPIRATORY_TRACT | 1 refills | Status: DC

## 2020-04-27 DIAGNOSIS — E7800 Pure hypercholesterolemia, unspecified: Secondary | ICD-10-CM | POA: Diagnosis not present

## 2020-05-17 ENCOUNTER — Ambulatory Visit: Payer: Medicare Other | Admitting: Internal Medicine

## 2020-05-21 DIAGNOSIS — E7800 Pure hypercholesterolemia, unspecified: Secondary | ICD-10-CM | POA: Diagnosis not present

## 2020-05-21 DIAGNOSIS — J449 Chronic obstructive pulmonary disease, unspecified: Secondary | ICD-10-CM | POA: Diagnosis not present

## 2020-05-21 DIAGNOSIS — I10 Essential (primary) hypertension: Secondary | ICD-10-CM | POA: Diagnosis not present

## 2020-05-21 DIAGNOSIS — Z23 Encounter for immunization: Secondary | ICD-10-CM | POA: Diagnosis not present

## 2020-05-21 DIAGNOSIS — I2510 Atherosclerotic heart disease of native coronary artery without angina pectoris: Secondary | ICD-10-CM | POA: Diagnosis not present

## 2020-06-01 ENCOUNTER — Ambulatory Visit (INDEPENDENT_AMBULATORY_CARE_PROVIDER_SITE_OTHER): Payer: Medicare Other | Admitting: Internal Medicine

## 2020-06-01 ENCOUNTER — Encounter: Payer: Self-pay | Admitting: Internal Medicine

## 2020-06-01 ENCOUNTER — Other Ambulatory Visit: Payer: Self-pay

## 2020-06-01 VITALS — BP 120/80 | HR 87 | Temp 97.6°F | Ht 66.0 in | Wt 149.2 lb

## 2020-06-01 DIAGNOSIS — J439 Emphysema, unspecified: Secondary | ICD-10-CM | POA: Diagnosis not present

## 2020-06-01 DIAGNOSIS — J8410 Pulmonary fibrosis, unspecified: Secondary | ICD-10-CM | POA: Diagnosis not present

## 2020-06-01 DIAGNOSIS — Z23 Encounter for immunization: Secondary | ICD-10-CM | POA: Diagnosis not present

## 2020-06-01 DIAGNOSIS — J849 Interstitial pulmonary disease, unspecified: Secondary | ICD-10-CM

## 2020-06-01 NOTE — Progress Notes (Signed)
OV 04/10/2017   Chief Complaint  Patient presents with  . Follow-up    Pt states her breathing is unchanged since last OV. Pt states she has mild non prod cough. Pt denies CP/tightness, f/c/s, and chest congestion.    Follow-up COPD but now has concerned that she has developed interstitial lung disease  She is here with her husband. Back in 2015 a high-resolution CT chest because I thought I heard crackles and thoracic radiology ruled out interstitial lung disease. In the interim she's had lung cancer screening CT chest at overall stability in her pulmonary function test. But there was concern because of crackles that she might have interstitial lung disease so we did a high-resolution CT chest and July 2018 and thoracic radiologist reported as definite precedence of ILD although it is not a UIP pattern. [name] She does say that her son and over the lung transplant in [place] 8 years ago at age of 37 because of IPF. She did have autoimmune panel that is only trace positive for ENP . 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 She denies any humidifier or mildew or mold in the house. The CT chest also shows coronary artery calcification. Of note I personally visualized the CT chest.  ACCP ILD questiin  - symptioms: occ cough x 2 years with some night cough and mild phleogn ona nd off. Very mild non troubling dyspnea x 3 years - pasth medical : GERD +, mild dysphagia + - personal exposure x -: +ver for remote use of rec / street drugs. Cig smoked age 28 though 28 at 68 cigs/dat - fam hx: COPD +, son with transplant for ?  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 Has a Macaw bird x > 20 years - residence: [place], [place], [place], [place], [place] - occupatin: bookeeper - pulmotiox hx: negagive  Results for Erin Li (MRN [id number]) as  of 04/10/2017 11:21  Ref. Range 04/03/2017 10:18  CK Total Latest Ref Range: 7 - 177 U/L 63  CK-MB Latest Ref Range: 0.3 - 4.0 ng/mL 2.4  Aldolase Latest Ref Range: <=8.1 U/L 3.5  Sed Rate Latest Ref Range: 0 - 30 mm/hr 20  Anit Nuclear Antibody(ANA) Latest Ref Range: NEGATIVE  NEG  Angiotensin-Converting Enzyme Latest Ref Range: 9 - 67 U/L 67  Cyclic Citrullin Peptide Ab Latest Units: Units <16  ds DNA Ab Latest Units: IU/mL 1  ENA RNP Ab Latest Ref Range: 0.0 - 0.9 AI 1.4 (H)  RA Latex Turbid. Latest Ref Range: <14 IU/mL <14  SSA (Ro) (ENA) Antibody, IgG Latest Ref Range: <1.0 NEG AI  <1.0 NEG  SSB (La) (ENA) Antibody, IgG Latest Ref Range: <1.0 NEG AI  <1.0 NEG  Scleroderma (Scl-70) (ENA) Antibody, IgG Latest Ref Range: <1.0 NEG AI  <1.0 NEG    OV 09/28/2017  Chief Complaint  Patient presents with  . Follow-up    PFT done today. States she has been doing good and has no complaints of cough, SOB, or CP.    Fu mixed empyhsema and pulmonary fibrosis  For  years of only known who is a COPD patient with a family history of pulmonary fibrosis.  But it appears that in 2018 she started developing signs of pulmonary fibrosis with crackles  and findings of indeterminate UIP on CT chest July 2018.  She has family history of pulmonary fibrosis and that her son had lung transplant for the same.  She now presents for follow-up.  In the interim she started feeling better.  She is less dyspneic.  She continues with the Brio.  There are no new issues.  Pulmonary function test done as documented below shows an improvement.  Therefore instead of biopsy she feels keeping an eye on this is a better approach.    Walking desaturation test on 09/28/2017 185 feet x 3 laps on ROOM AIR:  did NOT desaturate. Rest pulse ox was 99%, final pulse ox was96 %. HR response 78 /min at rest to 105/min at peak exertion. Patient Erin Li  Did nto Desaturate < 88% . Erin Li yes  Desaturated </= 3% points.  Erin Li yes did get tachyardic   OV 01/01/2018  Chief Complaint  Patient presents with  . Follow-up    PFT done today.  Pt states she has been doing good since last visit. Had a cold mid January 2019 but has been better since. Denies any complaints of cough, SOB, or CP.   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 She continues on Brio. Since last visit January 2019 she continues to feel better. COPD cat score is very minimal as documented below. She had pulmonary function test today and this shows stability in her FVC after initial improvement. Her DLCO is even better. She is on observation course regarding her ILD given the improvement in her pulmonary function test. Her last CT scan of the chest was July 2018        OV 07/09/2018  Subjective:  Patient ID: Erin Li, female , DOB: 03-19-1941 , age 79 y.o. , MRN: [id number] , ADDRESS: [place] [address]   07/09/2018 -   Chief Complaint  Patient presents with  . Consult    Alot of coughing producing mucus no color at this time.     HPI Erin Li 79 y.o. 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  She tells me that for the last 3 weeks has had increased cough with congestion and white sputum more than baseline but activities of daily living and shortness of breath or guarding is all unchanged and she has good effort tolerance.  There are no new other issues.  In terms of ILD: Last visit I thought I will be in improved based on improvement in pulmonary function test but actually that might of been a red herring because today's pulmonary function test shows a decline which is in line with previous pulmonary function test showing stability.  She certainly feels stable.   In July 2019 she had a low-dose CT scan for lung cancer and this ruled out lung cancer on screening.  They were able to report some mildly changes at the base.  She is not interested in surgical lung biopsy because her son who had pulmonary fibrosis finally died from 80 years of transplant from pulmonary embolism, stroke and acute renal failure and chronic critical illness in the rehab facility.      OV 10/08/2019  Subjective:  Patient ID: Erin Li, female , DOB: [birth year] , age 53 y.o. ,  MRN: [id number] , ADDRESS: [place] [address]   10/08/2019 -   Chief Complaint  Patient presents with  . Follow-up    Pt states she has been doing well since last visit and states her breathing has been doing well. Pt will have an occ cough.   predominantly COPD with some amount of basal pulmonary fibrosis  N(atlernate to UIP)  in the setting of her son having pulmonary fibrosis alt- decieasedd 2019    HPI Erin Li 79 y.o. 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  Last seen in #2019.  She presents with her husband.  Since the onset of the COVID-19 pandemic there have been in the house and socially isolated.  She has been doing well without any problems overall stable.  She not exercising as much as she used to.  She not taking any inhalers for her COPD.  She only takes Mucinex.  Therefore her cough symptom with mucus is slightly worse than before.  She had high-resolution CT scan of the chest which I reviewed the report and agree with the findings and I interpreted as well with visualization: She has emphysema associated with alternate diagnosis pattern.  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  She still grieving from her son who died in Jun 16, 2018 from pulmonary fibrosis.    OV 06/01/2020   Subjective:  Patient ID: Erin Li, female , DOB: Apr 26, 1941, age 9 y.o. years. , MRN: [id number],  ADDRESS: [place]  [place] [address] PCP  [name], MD Providers : Treatment Team:  Attending Provider: Brand Males, MD   Chief Complaint  Patient presents with  . Follow-up    ILD, no concerns    predominantly COPD with some amount of basal pulmonary fibrosis  N(atlernate to UIP)  in the setting of her son having pulmonary fibrosis alt- decieasedd 2019   HPI Erin Li 79 y.o. -presents for follow-up.  Last seen February 2021.  Since then she is doing stable.  She continues to be stable.  Her symptom scores are very minimal and shows stability.  She had pulmonary function test August 2021 and compared to November 2019 she is stable.  Her walking desaturation test is also stable.  Her last CT scan was January 2021.  She prefers to have monitoring for pulmonary fibrosis because of her son's issue.  She likes the strategy of alternating pulmonary function test and also CT scan of the chest.   SYMPTOM SCALE -  06/01/2020   O2 use ra  Shortness of Breath 0 -> 5 scale with 5 being worst (score 6 If unable to do)  At rest 0  Simple tasks - showers, clothes change, eating, shaving 0  Household (dishes, doing bed, laundry) 0  Shopping 0  Walking level at own pace 0  Walking up Stairs 2  Total (30-36) Dyspnea Score 2  How bad is your cough? 2  How bad is your fatigue 0  How bad is nausea 00  How bad is vomiting?  0  How bad is diarrhea? 0  How bad is anxiety? 0  How bad is depression 0        PFT Results Latest Ref Rng & Units 04/06/2020 07/09/2018 01/01/2018 09/28/2017 04/03/2017 12/07/2015 10/01/2015  FVC-Pre L 2.38 2.33 2.38 2.38 2.20 2.38 2.35  FVC-Predicted Pre % 83 79 81 81 77 82 81  FVC-Post L - - - 2.37 - 2.33 2.24  FVC-Predicted Post % - - -  80 - 80 77  Pre FEV1/FVC % % 65 59 68 63 60 65 60  Post FEV1/FCV % % - - - 70 - 67 65  FEV1-Pre L 1.55 1.38 1.63 1.51 1.32 1.54 1.41  FEV1-Predicted Pre % 72 62 74 68 62 71 64  FEV1-Post L - - - 1.65 - 1.55 1.46  DLCO uncorrected ml/min/mmHg 13.11  12.18 20.54 12.59 11.50 - -  DLCO UNC% % 66 47 80 49 47 - -  DLCO corrected ml/min/mmHg 13.11 - - 12.26 11.14 - -  DLCO COR %Predicted % 66 - - 47 45 - -  DLVA Predicted % 73 57 59 58 54 - -  TLC L - - - 5.35 - - -  TLC % Predicted % - - - 102 - - -  RV % Predicted % - - - 120 - - -       Simple office walk 185 feet x  3 laps goal with forehead probe 07/09/2018  06/01/2020   O2 used none   Number laps completed 3   Comments about pace normal Nl pace  Resting Pulse Ox/HR 99% and /min 94% and 88/min  Final Pulse Ox/HR 94% and 100/min 93% and 97/min  Desaturated </= 88% no   Desaturated <= 3% points yes   Got Tachycardic >/= 90/min yes   Symptoms at end of test x   Miscellaneous comments X   No stops    HRCT Jan 2021  IMPRESSION: 1. The appearance of the lungs is compatible with interstitial lung disease, with a spectrum of findings considered indeterminate for usual interstitial pneumonia (UIP) per current ATS guidelines. 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 2. Aortic atherosclerosis, in addition to left main and 3 vessel coronary artery disease. Assessment for potential risk factor modification, dietary therapy or pharmacologic therapy may be warranted, if clinically indicated. 3. Mild centrilobular emphysema.  Aortic Atherosclerosis (ICD10-I70.0) and Emphysema (ICD10-J43.9).  Electronically Signed: By: Vinnie Langton M.D. On: 09/23/2019 14:45   has a past medical history of Coronary artery disease, Diverticulosis, HTN (hypertension), and Lung nodule.   reports that she quit smoking about 8 years ago. Her smoking use included cigarettes. She has a 37.50 pack-year smoking history. She has never used smokeless tobacco.  Past Surgical History:  Procedure Laterality Date  . CARDIAC CATHETERIZATION    . CORONARY ANGIOPLASTY    . TONSILLECTOMY AND ADENOIDECTOMY      No Known  Allergies  Immunization History  Administered Date(s) Administered  . Influenza Split 06/04/2013, 05/05/2014  . Influenza Whole 05/28/2012  . Influenza, High Dose Seasonal PF 06/05/2016, 06/04/2017, 05/20/2018, 05/06/2019  . Influenza,inj,Quad PF,6+ Mos 06/05/2015  . Moderna SARS-COVID-2 Vaccination 11/10/2019, 12/08/2019  . Pneumococcal Conjugate-13 01/26/2015  . Pneumococcal Polysaccharide-23 09/04/2005, 09/24/2012  . Tdap 03/20/2012    Family History  Problem Relation Age of Onset  . Heart attack Father   . Kidney disease Father   . COPD Mother   . Lung cancer Brother 19  . Pulmonary fibrosis Son        had lung transplant     Current Outpatient Medications:  .  albuterol (PROVENTIL HFA;VENTOLIN HFA) 108 (90 BASE) MCG/ACT inhaler, Inhale 2 puffs into the lungs every 6 (six) hours as needed., Disp: , Rfl:  .  Ascorbic Acid (VITAMIN C) 1000 MG tablet, Take 1,000 mg by mouth daily., Disp: , Rfl:  .  atorvastatin (LIPITOR) 10 MG tablet, TAKE 1  TABLET BY MOUTH  EVERY EVENING AFTER DINNER, Disp: 30 tablet, Rfl: 1 .  b complex vitamins tablet, Take 1 tablet by mouth daily., Disp: , Rfl:  .  Biotin 1 MG CAPS, Take by mouth daily., Disp: , Rfl:  .  calcium-vitamin D 250-100 MG-UNIT per tablet, Take 1 tablet by mouth daily. , Disp: , Rfl:  .  diltiazem (CARDIZEM) 60 MG tablet, Take 60 mg by mouth 3 (three) times daily., Disp: , Rfl:  .  fish oil-omega-3 fatty acids 1000 MG capsule, Take 2 g by mouth daily., Disp: , Rfl:  .  fluticasone furoate-vilanterol (BREO ELLIPTA) 100-25 MCG/INH AEPB, USE 1 INHALATION BY MOUTH  DAILY, Disp: 180 each, Rfl: 1 .  glucosamine-chondroitin 500-400 MG tablet, Take 1 tablet by mouth every morning., Disp: , Rfl:  .  ibuprofen (ADVIL,MOTRIN) 200 MG tablet, Take 400 mg by mouth 2 (two) times daily., Disp: , Rfl:  .  magnesium 30 MG tablet, Take 30 mg by mouth 2 (two) times daily. 1 tabs daily (dose unknown), Disp: , Rfl:  .  selenium 50 MCG TABS, Take 50 mcg  by mouth daily., Disp: , Rfl:  .  senna (SENOKOT) 8.6 MG tablet, Take 1 tablet by mouth daily., Disp: , Rfl:  .  Tiotropium Bromide Monohydrate (SPIRIVA RESPIMAT) 1.25 MCG/ACT AERS, Inhale 2 puffs into the lungs daily., Disp: 12 g, Rfl: 3 .  Tiotropium Bromide Monohydrate (SPIRIVA RESPIMAT) 1.25 MCG/ACT AERS, Inhale 2 puffs into the lungs daily., Disp: 4 g, Rfl: 0 .  vitamin B-12 (CYANOCOBALAMIN) 1000 MCG tablet, Take 1,000 mcg by mouth daily., Disp: , Rfl:  .  vitamin E 200 UNIT capsule, Take 200 Units by mouth daily., Disp: , Rfl:       Objective:   Vitals:   06/01/20 1149  BP: 120/80  Pulse: 87  Temp: 97.6 F (36.4 C)  TempSrc: Temporal  SpO2: 93%  Weight: 149 lb 3.2 oz (67.7 kg)  Height: 5\' 6"  (1.676 m)    Estimated body mass index is 24.08 kg/m as calculated from the following:   Height as of this encounter: 5\' 6"  (1.676 m).   Weight as of this encounter: 149 lb 3.2 oz (67.7 kg).  @WEIGHTCHANGE @  [employer]   06/01/20 1149  Weight: 149 lb 3.2 oz (67.7 kg)     Physical Exam   General: No distress.  Neuro: Alert and Oriented x 3. GCS 15. Speech normal Psych: Pleasant Resp: Clear to ausucultation bilaterally. No wheeze .  Mild crackles at the lung base  CVS: Normal heart sounds. Murmurs - no HEENT: Normal upper airway. PEERL +. No post nasal drip      Assessment:       ICD-10-CM   1. Pulmonary emphysema with fibrosis of lung ([place])  J43.9 CT Chest High Resolution   J84.10   2. ILD (interstitial lung disease) ([place])  J84.9 CT Chest High Resolution  3. Flu vaccine need  Z23        Plan:     Patient Instructions     ICD-10-CM   1. Pulmonary emphysema with fibrosis of lung ([place])  J43.9    J84.10   2. ILD (interstitial lung disease) ([place])  J84.9   3. 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 Brand Males, M.D., F.C.C.P,  Pulmonary and Critical Care Medicine Staff Physician, [place] Director - Interstitial Lung Disease  Program  Pulmonary [place] at [place], [place], [address]  Pager: [phone], If no answer or between  15:00h - 7:00h: call [phone] Telephone: [phone]  5:34 PM 06/01/2020

## 2020-06-01 NOTE — Patient Instructions (Addendum)
ICD-10-CM   1. Pulmonary emphysema with fibrosis of lung ([place])  J43.9    J84.10   2. ILD (interstitial lung disease) ([place])  J84.9   3. Flu vaccine need  Z23      COPD   Plan  - continue mucinex  - continue breo daily - ok to hold off spiriva due to cost  - albuterol as needed  ILD  -Prsent and mild and stable - most likely is a variety called NSIP - PFT stable  - continued monitoring is plan  Plan -- do HRCT in March - April 2022   Vaccine need   - can have high dose flu shot 06/01/2020 -f you want -  covid booster in oct/nov 2021    Follow-up -return In Mr[facility]-April 2022 but after HRCT

## 2020-06-30 DIAGNOSIS — Z23 Encounter for immunization: Secondary | ICD-10-CM | POA: Diagnosis not present

## 2020-10-01 ENCOUNTER — Ambulatory Visit
Admission: RE | Admit: 2020-10-01 | Discharge: 2020-10-01 | Disposition: A | Discharge status: Home / Self Care | Payer: Medicare Other | Source: Ambulatory Visit | Attending: Internal Medicine | Admitting: Internal Medicine

## 2020-10-01 DIAGNOSIS — J849 Interstitial pulmonary disease, unspecified: Secondary | ICD-10-CM

## 2020-10-01 DIAGNOSIS — J8410 Pulmonary fibrosis, unspecified: Secondary | ICD-10-CM

## 2020-10-01 DIAGNOSIS — J439 Emphysema, unspecified: Secondary | ICD-10-CM

## 2020-10-01 DIAGNOSIS — R918 Other nonspecific abnormal finding of lung field: Secondary | ICD-10-CM | POA: Diagnosis not present

## 2020-10-04 ENCOUNTER — Other Ambulatory Visit: Payer: Self-pay | Admitting: Internal Medicine

## 2020-10-30 NOTE — Progress Notes (Signed)
All findings are stable x 1 year and beyond and these are nodules, ild , emphysema. Please let patient know   Xxxx  IMPRESSION: 1. Multiple small bilateral pulmonary nodules are stable and definitively benign, the largest a 9 mm pulmonary nodule of the superior segment right lower lobe. 2. There is mild, tubular bronchiectasis in the dependent bilateral lung bases with dependent bibasilar irregular peripheral interstitial opacity, ground-glass opacity, septal thickening and some areas of subpleural bronchiolectasis. Findings remain in an "indeterminate for UIP" pattern, not significantly changed compared to immediate prior examination although worsened over a long period of time on multiple examinations dating back to 2014. Consider ongoing CT follow-up to assess for stability of fibrotic findings and pattern. Findings are indeterminate for UIP per consensus guidelines: Diagnosis of Idiopathic Pulmonary Fibrosis: An Official ATS/ERS/JRS/ALAT Clinical Practice Guideline. [place], Iss 5, [phone], May 05 2017. 3. Emphysema. 4. Enlargement of the main pulmonary artery measuring up to 3.4 cm in caliber, as can be seen in pulmonary hypertension.  Aortic Atherosclerosis (ICD10-I70.0) and Emphysema (ICD10-J43.9).   Electronically Signed   By: Eddie Candle M.D.   On: 10/01/2020 16:43

## 2020-11-09 DIAGNOSIS — E7800 Pure hypercholesterolemia, unspecified: Secondary | ICD-10-CM | POA: Diagnosis not present

## 2020-11-10 DIAGNOSIS — I10 Essential (primary) hypertension: Secondary | ICD-10-CM | POA: Diagnosis not present

## 2020-11-10 DIAGNOSIS — E7800 Pure hypercholesterolemia, unspecified: Secondary | ICD-10-CM | POA: Diagnosis not present

## 2020-11-17 DIAGNOSIS — Z0000 Encounter for general adult medical examination without abnormal findings: Secondary | ICD-10-CM | POA: Diagnosis not present

## 2020-11-17 DIAGNOSIS — J449 Chronic obstructive pulmonary disease, unspecified: Secondary | ICD-10-CM | POA: Diagnosis not present

## 2020-11-17 DIAGNOSIS — I10 Essential (primary) hypertension: Secondary | ICD-10-CM | POA: Diagnosis not present

## 2020-11-17 DIAGNOSIS — E7800 Pure hypercholesterolemia, unspecified: Secondary | ICD-10-CM | POA: Diagnosis not present

## 2020-11-17 DIAGNOSIS — Z23 Encounter for immunization: Secondary | ICD-10-CM | POA: Diagnosis not present

## 2020-11-17 DIAGNOSIS — I2510 Atherosclerotic heart disease of native coronary artery without angina pectoris: Secondary | ICD-10-CM | POA: Diagnosis not present

## 2020-11-17 DIAGNOSIS — F4321 Adjustment disorder with depressed mood: Secondary | ICD-10-CM | POA: Diagnosis not present

## 2020-11-22 NOTE — Telephone Encounter (Signed)
MR, please see mychart message sent by pt and advise:  To: [facility]OL    From: Erin Li    Created: 11/19/2020 7:52 PM     *-*-*This message has not been handled.*-*-*  Would you kindly change my Breo prescription to Advair generic due to insurance costs.  I am changing pharmacies from [facility] to [facility] [address], [place] , [place].  Thanking you in advance.

## 2020-12-24 MED ORDER — FLUTICASONE-SALMETEROL 115-21 MCG/ACT IN AERO: 2.0000 | INHALATION_SPRAY | Freq: Every day | RESPIRATORY_TRACT | 12 refills | Status: DC

## 2020-12-24 MED ORDER — FLUTICASONE-SALMETEROL 115-21 MCG/ACT IN AERO: 2.0000 | INHALATION_SPRAY | Freq: Two times a day (BID) | RESPIRATORY_TRACT | 12 refills | Status: DC

## 2020-12-24 NOTE — Telephone Encounter (Signed)
Dr. Chase Caller please advise on patient message that was originally sent to you on 11/22/2020   Would you kindly change my Breo prescription to Advair generic due to insurance costs.  I am changing pharmacies from [place] to [facility] [address], [place] , [place].  Thanking you in advance.

## 2020-12-24 NOTE — Telephone Encounter (Signed)
Looks like Advair 250 is not covered, went and talked to Dr. Chase Caller to advise him that there is not an Advair HFA 110 but there is a 115. He stated that he wants the Advair HFA 115 sent in. Order has been sent in and patient made aware. Nothing further needed at this time.

## 2020-12-24 NOTE — Telephone Encounter (Signed)
Sorry not sure how I missed it but I am only seeing the message now.  8 it was not sent a month ago or maybe an accidental click from me and it disappeared and I did not realize that.  My apologies  Faythe Ghee to change to Advair 250 - 1 puff twice daily [if it is the disc].  If it is the [facility] then it is 110 strength 2 puffs twice daily

## 2020-12-24 NOTE — Telephone Encounter (Signed)
Please see encounter from 11/19/20. Will close this encounter

## 2021-01-07 DIAGNOSIS — Z961 Presence of intraocular lens: Secondary | ICD-10-CM | POA: Diagnosis not present

## 2021-01-07 DIAGNOSIS — H527 Unspecified disorder of refraction: Secondary | ICD-10-CM | POA: Diagnosis not present

## 2021-01-07 DIAGNOSIS — H43811 Vitreous degeneration, right eye: Secondary | ICD-10-CM | POA: Diagnosis not present

## 2021-01-07 DIAGNOSIS — H35373 Puckering of macula, bilateral: Secondary | ICD-10-CM | POA: Diagnosis not present

## 2021-01-07 DIAGNOSIS — H353131 Nonexudative age-related macular degeneration, bilateral, early dry stage: Secondary | ICD-10-CM | POA: Diagnosis not present

## 2021-01-07 DIAGNOSIS — D3132 Benign neoplasm of left choroid: Secondary | ICD-10-CM | POA: Diagnosis not present

## 2021-01-07 DIAGNOSIS — H02834 Dermatochalasis of left upper eyelid: Secondary | ICD-10-CM | POA: Diagnosis not present

## 2021-01-07 DIAGNOSIS — H02831 Dermatochalasis of right upper eyelid: Secondary | ICD-10-CM | POA: Diagnosis not present

## 2021-01-11 DIAGNOSIS — Z23 Encounter for immunization: Secondary | ICD-10-CM | POA: Diagnosis not present

## 2021-04-17 NOTE — Telephone Encounter (Signed)
MR please advise. Thanks   This is a request to change the advair [facility] prescription back to  [facility].  I find that I have less coughing with the Breo and also the concvenience of once a day.  I am using [facility] in [place]. [phone].  Thanking you in advance .  Have a blessed day.

## 2021-04-18 MED ORDER — FLUTICASONE FUROATE-VILANTEROL 100-25 MCG/INH IN AEPB: 1.0000 | INHALATION_SPRAY | Freq: Every day | RESPIRATORY_TRACT | 5 refills | Status: DC

## 2021-04-18 NOTE — Telephone Encounter (Signed)
Ok to change to breo lower dose

## 2021-05-18 DIAGNOSIS — I10 Essential (primary) hypertension: Secondary | ICD-10-CM | POA: Diagnosis not present

## 2021-05-18 DIAGNOSIS — I2510 Atherosclerotic heart disease of native coronary artery without angina pectoris: Secondary | ICD-10-CM | POA: Diagnosis not present

## 2021-05-18 DIAGNOSIS — E7800 Pure hypercholesterolemia, unspecified: Secondary | ICD-10-CM | POA: Diagnosis not present

## 2021-05-25 DIAGNOSIS — E7800 Pure hypercholesterolemia, unspecified: Secondary | ICD-10-CM | POA: Diagnosis not present

## 2021-05-25 DIAGNOSIS — I10 Essential (primary) hypertension: Secondary | ICD-10-CM | POA: Diagnosis not present

## 2021-05-25 DIAGNOSIS — M810 Age-related osteoporosis without current pathological fracture: Secondary | ICD-10-CM | POA: Diagnosis not present

## 2021-05-25 DIAGNOSIS — Z23 Encounter for immunization: Secondary | ICD-10-CM | POA: Diagnosis not present

## 2021-05-25 DIAGNOSIS — J449 Chronic obstructive pulmonary disease, unspecified: Secondary | ICD-10-CM | POA: Diagnosis not present

## 2021-05-25 DIAGNOSIS — M79672 Pain in left foot: Secondary | ICD-10-CM | POA: Diagnosis not present

## 2021-10-26 ENCOUNTER — Encounter: Payer: Self-pay | Admitting: Internal Medicine

## 2021-10-26 ENCOUNTER — Other Ambulatory Visit: Payer: Self-pay | Admitting: Internal Medicine

## 2021-10-26 MED ORDER — FLUTICASONE FUROATE-VILANTEROL 100-25 MCG/ACT IN AEPB: 1.0000 | INHALATION_SPRAY | Freq: Every day | RESPIRATORY_TRACT | 0 refills | Status: DC

## 2021-11-09 ENCOUNTER — Other Ambulatory Visit: Payer: Self-pay | Admitting: Internal Medicine

## 2021-11-16 DIAGNOSIS — E559 Vitamin D deficiency, unspecified: Secondary | ICD-10-CM | POA: Diagnosis not present

## 2021-11-16 DIAGNOSIS — E7800 Pure hypercholesterolemia, unspecified: Secondary | ICD-10-CM | POA: Diagnosis not present

## 2021-11-16 DIAGNOSIS — Z780 Asymptomatic menopausal state: Secondary | ICD-10-CM | POA: Diagnosis not present

## 2021-11-16 DIAGNOSIS — I10 Essential (primary) hypertension: Secondary | ICD-10-CM | POA: Diagnosis not present

## 2021-11-20 ENCOUNTER — Encounter: Payer: Self-pay | Admitting: Internal Medicine

## 2021-11-20 DIAGNOSIS — J849 Interstitial pulmonary disease, unspecified: Secondary | ICD-10-CM

## 2021-11-20 DIAGNOSIS — J441 Chronic obstructive pulmonary disease with (acute) exacerbation: Secondary | ICD-10-CM | POA: Diagnosis not present

## 2021-11-21 ENCOUNTER — Other Ambulatory Visit: Payer: Self-pay | Admitting: Internal Medicine

## 2021-11-21 MED ORDER — FLUTICASONE FUROATE-VILANTEROL 100-25 MCG/ACT IN AEPB: 1.0000 | INHALATION_SPRAY | Freq: Every day | RESPIRATORY_TRACT | 2 refills | Status: DC

## 2021-11-22 ENCOUNTER — Telehealth: Payer: Self-pay | Admitting: Internal Medicine

## 2021-11-22 NOTE — Telephone Encounter (Signed)
Spoke to patient.  ?She stated that [facility] was sent to preferred pharmacy yesterday.  ?Our records reflect this.  ?Nothing further needed.  ? ?

## 2021-11-23 DIAGNOSIS — Z0000 Encounter for general adult medical examination without abnormal findings: Secondary | ICD-10-CM | POA: Diagnosis not present

## 2021-11-23 DIAGNOSIS — M810 Age-related osteoporosis without current pathological fracture: Secondary | ICD-10-CM | POA: Diagnosis not present

## 2021-11-23 DIAGNOSIS — E559 Vitamin D deficiency, unspecified: Secondary | ICD-10-CM | POA: Diagnosis not present

## 2021-11-23 DIAGNOSIS — E7800 Pure hypercholesterolemia, unspecified: Secondary | ICD-10-CM | POA: Diagnosis not present

## 2021-11-23 DIAGNOSIS — I10 Essential (primary) hypertension: Secondary | ICD-10-CM | POA: Diagnosis not present

## 2021-11-23 DIAGNOSIS — I2510 Atherosclerotic heart disease of native coronary artery without angina pectoris: Secondary | ICD-10-CM | POA: Diagnosis not present

## 2021-11-23 DIAGNOSIS — J449 Chronic obstructive pulmonary disease, unspecified: Secondary | ICD-10-CM | POA: Diagnosis not present

## 2021-11-30 DIAGNOSIS — Z1211 Encounter for screening for malignant neoplasm of colon: Secondary | ICD-10-CM | POA: Diagnosis not present

## 2021-11-30 DIAGNOSIS — Z1212 Encounter for screening for malignant neoplasm of rectum: Secondary | ICD-10-CM | POA: Diagnosis not present

## 2021-12-05 NOTE — Progress Notes (Signed)
? ? ? ?OV 04/10/2017 ? ? ?Chief Complaint  ?Patient presents with  ? Follow-up  ?  Pt states her breathing is unchanged since last OV. Pt states she has mild non prod cough. Pt denies CP/tightness, f/c/s, and chest congestion.   ? ?Follow-up COPD but now has concerned that she has developed interstitial lung disease ? ?She is here with her husband. Back in 2015 a high-resolution CT chest because I thought I heard crackles and thoracic radiology ruled out interstitial lung disease. In the interim she's had lung cancer screening CT chest at overall stability in her pulmonary function test. But there was concern because of crackles that she might have interstitial lung disease so we did a high-resolution CT chest and July 2018 and thoracic radiologist reported as definite precedence of ILD although it is not a UIP pattern. [name] She does say that her son and over the lung transplant in [place] 8 years ago at age of 36 because of IPF. She did have autoimmune panel that is only trace positive for ENP . 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 She denies any humidifier or mildew or mold in the house. The CT chest also shows coronary artery calcification. Of note I personally visualized the CT chest. ? ?ACCP ILD questiin ? - symptioms: occ cough x 2 years with some night cough and mild phleogn ona nd off. Very mild non troubling dyspnea x 3 years ?- pasth medical : GERD +, mild dysphagia + ?- personal exposure x -: +ver for remote use of rec / street drugs. Cig smoked age 43 though 75 at 16 cigs/dat ?- fam hx: COPD +, son with transplant for ?  IPF at age 44 ?- home exposurE: does have hot tube - uses it once a month. Has a Macaw bird x > 20 years ?- residence: [place], [place], [place], [place], [place] ?- occupatin: bookeeper ?- pulmotiox hx: negagive ? ?Results for AMYRIAH, BURAS (MRN [id number]) as  of 04/10/2017 11:21 ? Ref. Range 04/03/2017 10:18  ?CK Total Latest Ref Range: 7 - 177 U/L 63  ?CK-MB Latest Ref Range: 0.3 - 4.0 ng/mL 2.4  ?Aldolase Latest Ref Range: <=8.1 U/L 3.5  ?Sed Rate Latest Ref Range: 0 - 30 mm/hr 20  ?Anit Nuclear Antibody(ANA) Latest Ref Range: NEGATIVE  NEG  ?Angiotensin-Converting Enzyme Latest Ref Range: 9 - 67 U/L 67  ?Cyclic Citrullin Peptide Ab Latest Units: Units <16  ?ds DNA Ab Latest Units: IU/mL 1  ?ENA RNP Ab Latest Ref Range: 0.0 - 0.9 AI 1.4 (H)  ?RA Latex Turbid. Latest Ref Range: <14 IU/mL <14  ?SSA (Ro) (ENA) Antibody, IgG Latest Ref Range: <1.0 NEG AI  <1.0 NEG  ?SSB (La) (ENA) Antibody, IgG Latest Ref Range: <1.0 NEG AI  <1.0 NEG  ?Scleroderma (Scl-70) (ENA) Antibody, IgG Latest Ref Range: <1.0 NEG AI  <1.0 NEG  ? ? ?OV 09/28/2017 ? ?Chief Complaint  ?Patient presents with  ? Follow-up  ?  PFT done today. States she has been doing good and has no complaints of cough, SOB, or CP.  ? ? ?Fu mixed empyhsema and pulmonary fibrosis ? ?For  years of only known who is a COPD patient with a family history of pulmonary fibrosis.  But it appears that in 2018 she started developing signs of pulmonary fibrosis with crackles  and findings of indeterminate UIP on CT chest July 2018.  She has family history of pulmonary fibrosis and that her son had lung transplant for the same.  She now presents for follow-up.  In the interim she started feeling better.  She is less dyspneic.  She continues with the Brio.  There are no new issues.  Pulmonary function test done as documented below shows an improvement.  Therefore instead of biopsy she feels keeping an eye on this is a better approach. ? ? ? ?Walking desaturation test on 09/28/2017 185 feet x 3 laps on ROOM AIR:  did NOT desaturate. Rest pulse ox was 99%, final pulse ox was96 %. HR response 78 /min at rest to 105/min at peak exertion. Patient Erin Li  Did nto Desaturate < 88% . Erin Li yes  Desaturated </= 3% points.  Erin Li yes did get tachyardic ? ? ?OV 01/01/2018 ? ?Chief Complaint  ?Patient presents with  ? Follow-up  ?  PFT done today.  Pt states she has been doing good since last visit. Had a cold mid January 2019 but has been better since. Denies any complaints of cough, SOB, or CP.  ? 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 ?Respiratory continues to do well. She continues on Brio. Since last visit January 2019 she continues to feel better. COPD cat score is very minimal as documented below. She had pulmonary function test today and this shows stability in her FVC after initial improvement. Her DLCO is even better. She is on observation course regarding her ILD given the improvement in her pulmonary function test. Her last CT scan of the chest was July 2018 ? ? ? ? ? ? ? ?OV 07/09/2018 ? ?Subjective:  ?Patient ID: Erin Li, female , DOB: 1941/03/25 , age 81 y.o. , MRN: [id number] , ADDRESS: [address] ?[place] [address] ? ? ?07/09/2018 -   ?Chief Complaint  ?Patient presents with  ? Consult  ?  Alot of coughing producing mucus no color at this time.  ? ? ? ?HPI ?Erin Li 81 y.o. -follow predominantly COPD with some amount of basal pulmonary fibrosis in the setting of her son having pulmonary fibrosis ? ?She returns for routine follow-up.  She tells me that for the last 3 weeks has had increased cough with congestion and white sputum more than baseline but activities of daily living and shortness of breath or guarding is all unchanged and she has good effort tolerance.  There are no new other issues.  In terms of ILD: Last visit I thought I will be in improved based on improvement in pulmonary function test but actually that might of been a red herring because today's pulmonary function test shows a decline which is in line with previous pulmonary function test showing stability.  She certainly feels stable.  In  July 2019 she had a low-dose CT scan for lung cancer and this ruled out lung cancer on screening.  They were able to report some mildly changes at the base.  She is not interested in surgical lung biopsy because her son who had pulmonary fibrosis finally died from 62 years of transplant from pulmonary embolism, stroke and acute renal failure and chronic critical illness in the rehab facility. ? ? ? ? ? ?OV 10/08/2019 ? ?Subjective:  ?Patient ID: Erin Li, female , DOB: 01/03/1941 , age 73 y.o. ,  MRN: [id number] , ADDRESS: [address] ?[place] [address] ? ? ?10/08/2019 -   ?Chief Complaint  ?Patient presents with  ? Follow-up  ?  Pt states she has been doing well since last visit and states her breathing has been doing well. Pt will have an occ cough.  ? 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 ? ? ?HPI ?Erin Li 81 y.o. 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  Last seen in #2019.  She presents with her husband.  Since the onset of the COVID-19 pandemic there have been in the house and socially isolated.  She has been doing well without any problems overall stable.  She not exercising as much as she used to.  She not taking any inhalers for her COPD.  She only takes Mucinex.  Therefore her cough symptom with mucus is slightly worse than before.  She had high-resolution CT scan of the chest which I reviewed the report and agree with the findings and I interpreted as well with visualization: She has emphysema associated with alternate diagnosis pattern.  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  She still grieving from her son who died in Jun 18, 2018 from pulmonary fibrosis. ? ? ? ?OV 06/01/2020 ? ? ?Subjective:  ?Patient ID: Erin Li, female , DOB: 06/15/1941, age 56 y.o. years. , MRN: [id number],  ADDRESS: [address] ?[place]  [address] ?PCP  [name], MD ?Providers : Treatment Team:  ?Attending Provider: Brand Males, MD ? ? ?Chief Complaint  ?Patient presents with  ? Follow-up  ?  ILD, no concerns  ? ? ?predominantly COPD with

## 2021-12-06 ENCOUNTER — Encounter: Payer: Self-pay | Admitting: Internal Medicine

## 2021-12-06 ENCOUNTER — Ambulatory Visit: Payer: Medicare Other | Admitting: Internal Medicine

## 2021-12-06 VITALS — BP 112/68 | HR 91 | Ht 66.0 in | Wt 143.8 lb

## 2021-12-06 DIAGNOSIS — J849 Interstitial pulmonary disease, unspecified: Secondary | ICD-10-CM | POA: Diagnosis not present

## 2021-12-06 DIAGNOSIS — Z7184 Encounter for health counseling related to travel: Secondary | ICD-10-CM

## 2021-12-06 DIAGNOSIS — J439 Emphysema, unspecified: Secondary | ICD-10-CM | POA: Diagnosis not present

## 2021-12-06 DIAGNOSIS — J8410 Pulmonary fibrosis, unspecified: Secondary | ICD-10-CM

## 2021-12-06 NOTE — Patient Instructions (Addendum)
? ?  COPD   ? ?- recent flare up but now stable ? ?Plan ? - continue mucinex  ?- continue breo daily ?- - albuterol as needed ? ?ILD ? 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 ? ?Plan ?-- do HRCT end June/early July 2023 ? - do spiro/dlco In June/July 2023 ? ?TRavel Advice - [place] 04/23/22- 05/01/22 ? ? - can dispense antibiotic, prednisone and antiviral for travel but during followup in June/July 2023 ? ?Follow-up ?-return In  June/July 2023 after test - 30 min slot ?

## 2021-12-08 LAB — COLOGUARD: COLOGUARD: NEGATIVE

## 2021-12-27 ENCOUNTER — Encounter: Payer: Self-pay | Admitting: Internal Medicine

## 2022-01-24 ENCOUNTER — Other Ambulatory Visit: Payer: Self-pay | Admitting: Internal Medicine

## 2022-02-21 ENCOUNTER — Ambulatory Visit (HOSPITAL_BASED_OUTPATIENT_CLINIC_OR_DEPARTMENT_OTHER): Payer: Medicare Other

## 2022-02-25 ENCOUNTER — Inpatient Hospital Stay (HOSPITAL_BASED_OUTPATIENT_CLINIC_OR_DEPARTMENT_OTHER)
Admission: EM | Admit: 2022-02-25 | Discharge: 2022-02-28 | DRG: 481 | Disposition: A | Discharge status: Home Health | Payer: Medicare Other | Attending: Internal Medicine | Admitting: Internal Medicine

## 2022-02-25 ENCOUNTER — Other Ambulatory Visit (HOSPITAL_BASED_OUTPATIENT_CLINIC_OR_DEPARTMENT_OTHER): Payer: Self-pay | Admitting: Emergency Medicine

## 2022-02-25 ENCOUNTER — Ambulatory Visit (HOSPITAL_BASED_OUTPATIENT_CLINIC_OR_DEPARTMENT_OTHER)
Admission: RE | Admit: 2022-02-25 | Discharge: 2022-02-25 | Disposition: A | Discharge status: Home / Self Care | Payer: Medicare Other | Source: Ambulatory Visit | Attending: Internal Medicine | Admitting: Internal Medicine

## 2022-02-25 ENCOUNTER — Other Ambulatory Visit: Payer: Self-pay

## 2022-02-25 ENCOUNTER — Encounter (HOSPITAL_BASED_OUTPATIENT_CLINIC_OR_DEPARTMENT_OTHER): Payer: Self-pay

## 2022-02-25 ENCOUNTER — Ambulatory Visit (HOSPITAL_BASED_OUTPATIENT_CLINIC_OR_DEPARTMENT_OTHER)
Admission: RE | Admit: 2022-02-25 | Discharge: 2022-02-25 | Disposition: A | Discharge status: Home / Self Care | Payer: Medicare Other | Source: Ambulatory Visit | Attending: Emergency Medicine | Admitting: Emergency Medicine

## 2022-02-25 DIAGNOSIS — J8410 Pulmonary fibrosis, unspecified: Secondary | ICD-10-CM | POA: Diagnosis not present

## 2022-02-25 DIAGNOSIS — E785 Hyperlipidemia, unspecified: Secondary | ICD-10-CM | POA: Diagnosis not present

## 2022-02-25 DIAGNOSIS — S72001A Fracture of unspecified part of neck of right femur, initial encounter for closed fracture: Secondary | ICD-10-CM | POA: Diagnosis not present

## 2022-02-25 DIAGNOSIS — Z825 Family history of asthma and other chronic lower respiratory diseases: Secondary | ICD-10-CM

## 2022-02-25 DIAGNOSIS — I10 Essential (primary) hypertension: Secondary | ICD-10-CM | POA: Diagnosis present

## 2022-02-25 DIAGNOSIS — Z79899 Other long term (current) drug therapy: Secondary | ICD-10-CM

## 2022-02-25 DIAGNOSIS — S7291XA Unspecified fracture of right femur, initial encounter for closed fracture: Secondary | ICD-10-CM | POA: Diagnosis present

## 2022-02-25 DIAGNOSIS — J849 Interstitial pulmonary disease, unspecified: Secondary | ICD-10-CM

## 2022-02-25 DIAGNOSIS — X58XXXA Exposure to other specified factors, initial encounter: Secondary | ICD-10-CM | POA: Insufficient documentation

## 2022-02-25 DIAGNOSIS — W19XXXA Unspecified fall, initial encounter: Secondary | ICD-10-CM | POA: Insufficient documentation

## 2022-02-25 DIAGNOSIS — M25551 Pain in right hip: Secondary | ICD-10-CM | POA: Insufficient documentation

## 2022-02-25 DIAGNOSIS — Z87891 Personal history of nicotine dependence: Secondary | ICD-10-CM

## 2022-02-25 DIAGNOSIS — R34 Anuria and oliguria: Secondary | ICD-10-CM | POA: Diagnosis present

## 2022-02-25 DIAGNOSIS — J449 Chronic obstructive pulmonary disease, unspecified: Secondary | ICD-10-CM | POA: Diagnosis present

## 2022-02-25 DIAGNOSIS — I2510 Atherosclerotic heart disease of native coronary artery without angina pectoris: Secondary | ICD-10-CM | POA: Diagnosis not present

## 2022-02-25 DIAGNOSIS — W010XXA Fall on same level from slipping, tripping and stumbling without subsequent striking against object, initial encounter: Secondary | ICD-10-CM | POA: Diagnosis present

## 2022-02-25 DIAGNOSIS — R918 Other nonspecific abnormal finding of lung field: Secondary | ICD-10-CM | POA: Diagnosis not present

## 2022-02-25 DIAGNOSIS — Z9861 Coronary angioplasty status: Secondary | ICD-10-CM | POA: Diagnosis not present

## 2022-02-25 DIAGNOSIS — J439 Emphysema, unspecified: Secondary | ICD-10-CM | POA: Diagnosis not present

## 2022-02-25 DIAGNOSIS — J441 Chronic obstructive pulmonary disease with (acute) exacerbation: Secondary | ICD-10-CM | POA: Diagnosis present

## 2022-02-25 DIAGNOSIS — S72141A Displaced intertrochanteric fracture of right femur, initial encounter for closed fracture: Principal | ICD-10-CM | POA: Diagnosis present

## 2022-02-25 DIAGNOSIS — S72001D Fracture of unspecified part of neck of right femur, subsequent encounter for closed fracture with routine healing: Secondary | ICD-10-CM | POA: Diagnosis not present

## 2022-02-25 DIAGNOSIS — R911 Solitary pulmonary nodule: Secondary | ICD-10-CM | POA: Diagnosis not present

## 2022-02-25 DIAGNOSIS — Y9301 Activity, walking, marching and hiking: Secondary | ICD-10-CM | POA: Diagnosis present

## 2022-02-25 DIAGNOSIS — Z8249 Family history of ischemic heart disease and other diseases of the circulatory system: Secondary | ICD-10-CM

## 2022-02-25 DIAGNOSIS — Z7951 Long term (current) use of inhaled steroids: Secondary | ICD-10-CM | POA: Diagnosis not present

## 2022-02-25 LAB — CBC WITH DIFFERENTIAL/PLATELET
Abs Immature Granulocytes: 0.06 10*3/uL (ref 0.00–0.07)
Basophils Absolute: 0 10*3/uL (ref 0.0–0.1)
Basophils Relative: 0 %
Eosinophils Absolute: 0.1 10*3/uL (ref 0.0–0.5)
Eosinophils Relative: 1 %
HCT: 40.2 % (ref 36.0–46.0)
Hemoglobin: 13.4 g/dL (ref 12.0–15.0)
Immature Granulocytes: 1 %
Lymphocytes Relative: 8 %
Lymphs Abs: 1 10*3/uL (ref 0.7–4.0)
MCH: 31.2 pg (ref 26.0–34.0)
MCHC: 33.3 g/dL (ref 30.0–36.0)
MCV: 93.7 fL (ref 80.0–100.0)
Monocytes Absolute: 0.7 10*3/uL (ref 0.1–1.0)
Monocytes Relative: 6 %
Neutro Abs: 10.7 10*3/uL — ABNORMAL HIGH (ref 1.7–7.7)
Neutrophils Relative %: 84 %
Platelets: 267 10*3/uL (ref 150–400)
RBC: 4.29 MIL/uL (ref 3.87–5.11)
RDW: 15.2 % (ref 11.5–15.5)
WBC: 12.6 10*3/uL — ABNORMAL HIGH (ref 4.0–10.5)
nRBC: 0 % (ref 0.0–0.2)

## 2022-02-25 LAB — BASIC METABOLIC PANEL
Anion gap: 10 (ref 5–15)
BUN: 17 mg/dL (ref 8–23)
CO2: 23 mmol/L (ref 22–32)
Calcium: 9.7 mg/dL (ref 8.9–10.3)
Chloride: 104 mmol/L (ref 98–111)
Creatinine, Ser: 0.8 mg/dL (ref 0.44–1.00)
GFR, Estimated: >60 mL/min (ref >60)
Glucose, Bld: 99 mg/dL (ref 70–99)
Potassium: 3.8 mmol/L (ref 3.5–5.1)
Sodium: 137 mmol/L (ref 135–145)

## 2022-02-25 LAB — PROTIME-INR
INR: 1.1 (ref 0.8–1.2)
Prothrombin Time: 14.4 seconds (ref 11.4–15.2)

## 2022-02-25 MED ORDER — METHOCARBAMOL 500 MG PO TABS
500.0000 mg | ORAL_TABLET | Freq: Four times a day (QID) | ORAL | Status: DC | PRN
  Administered 2022-02-26: 500 mg via ORAL
  Filled 2022-02-25 (×2): qty 1

## 2022-02-25 MED ORDER — MUPIROCIN 2 % EX OINT
1.0000 | TOPICAL_OINTMENT | Freq: Two times a day (BID) | CUTANEOUS | Status: DC
  Filled 2022-02-25: qty 22

## 2022-02-25 MED ORDER — ALBUTEROL SULFATE (2.5 MG/3ML) 0.083% IN NEBU
2.5000 mg | INHALATION_SOLUTION | RESPIRATORY_TRACT | Status: DC | PRN
  Administered 2022-02-26: 2.5 mg via RESPIRATORY_TRACT
  Filled 2022-02-25: qty 3

## 2022-02-25 MED ORDER — FENTANYL CITRATE PF 50 MCG/ML IJ SOSY
25.0000 ug | PREFILLED_SYRINGE | INTRAMUSCULAR | Status: DC | PRN
  Administered 2022-02-25 (×3): 25 ug via INTRAVENOUS
  Filled 2022-02-25 (×3): qty 1

## 2022-02-25 MED ORDER — HYDROCODONE-ACETAMINOPHEN 5-325 MG PO TABS
1.0000 | ORAL_TABLET | Freq: Four times a day (QID) | ORAL | Status: DC | PRN
  Administered 2022-02-26: 1 via ORAL
  Filled 2022-02-25: qty 1

## 2022-02-25 MED ORDER — METHOCARBAMOL 1000 MG/10ML IJ SOLN: 500.0000 mg | Freq: Four times a day (QID) | INTRAVENOUS | Status: DC | PRN

## 2022-02-25 MED ORDER — SODIUM CHLORIDE 0.9 % IV SOLN: INTRAVENOUS | Status: AC

## 2022-02-25 MED ORDER — MORPHINE SULFATE (PF) 2 MG/ML IV SOLN
0.5000 mg | INTRAVENOUS | Status: DC | PRN
  Administered 2022-02-26: 0.5 mg via INTRAVENOUS
  Filled 2022-02-25: qty 1

## 2022-02-25 NOTE — ED Notes (Signed)
Handoff report given to Hermelinda Dellen RN on 5N at [facility]

## 2022-02-25 NOTE — ED Provider Notes (Signed)
Emergency Department Provider Note   I have reviewed the triage vital signs and the nursing notes.   HISTORY  Chief Complaint Fall and Hip Pain   HPI Erin Li is a 81 y.o. female with past medical history reviewed below presents the emergency department for evaluation of right hip pain after fall.  Patient was coming to the med center today for outpatient CT scan of the chest.  She states she was walking in and did not see the curb, causing her to trip and fall.  She was unable to bear weight and staff came out to assist.  She did go to radiology for her CT scan but the prior EDP was asked to come over and evaluate in radiology.  X-ray of the right hip was added.  Patient did not strike her head or lose consciousness.  No syncope prodrome.  She states at rest she is feeling fine but has severe pain with attempting to move the right hip.  She has not established with any local orthopedist.  She is not anticoagulated.   Past Medical History:  Diagnosis Date   Coronary artery disease    Diverticulosis    HTN (hypertension)    Lung nodule     Review of Systems  Constitutional: No fever/chills Eyes: No visual changes. ENT: No sore throat. Cardiovascular: Denies chest pain. Respiratory: Denies shortness of breath. Gastrointestinal: No abdominal pain.  No nausea, no vomiting.  No diarrhea.  No constipation. Genitourinary: Negative for dysuria. Musculoskeletal: Positive right hip pain.  Skin: Negative for rash. Neurological: Negative for headaches, focal weakness or numbness.   ____________________________________________   PHYSICAL EXAM:  VITAL SIGNS: ED Triage Vitals  Enc Vitals Group     BP 02/25/22 1631 135/70     Pulse Rate 02/25/22 1631 82     Resp 02/25/22 1631 16     Temp 02/25/22 1631 98.5 F (36.9 C)     Temp Source 02/25/22 1631 Oral     SpO2 02/25/22 1631 99 %    Constitutional: Alert and oriented. Well appearing and in no acute distress. Eyes:  Conjunctivae are normal. Head: Atraumatic. Nose: No congestion/rhinnorhea. Mouth/Throat: Mucous membranes are moist.   Neck: No stridor.  No cervical spine tenderness to palpation. Cardiovascular: Normal rate, regular rhythm. Good peripheral circulation. Grossly normal heart sounds.   Respiratory: Normal respiratory effort.  No retractions. Lungs CTAB. Gastrointestinal: Soft and nontender. No distention.  Musculoskeletal: The right hip is shortened and externally rotated.  No knee or ankle tenderness.  Normal range of motion of the left hip without pain. Neurologic:  Normal speech and language. No gross focal neurologic deficits are appreciated.  Skin:  Skin is warm, dry and intact. No rash noted.  ____________________________________________   LABS (all labs ordered are listed, but only abnormal results are displayed)  Labs Reviewed  CBC WITH DIFFERENTIAL/PLATELET - Abnormal; Notable for the following components:      Result Value   WBC 12.6 (*)    Neutro Abs 10.7 (*)    All other components within normal limits  BASIC METABOLIC PANEL  PROTIME-INR   ____________________________________________  EKG    ____________________________________________  RADIOLOGY  CT Chest High Resolution  Result Date: 02/25/2022 CLINICAL DATA:  Interstitial lung disease EXAM: CT CHEST WITHOUT CONTRAST TECHNIQUE: Multidetector CT imaging of the chest was performed following the standard protocol without intravenous contrast. High resolution imaging of the lungs, as well as inspiratory and expiratory imaging, was performed. RADIATION DOSE REDUCTION: This exam was  performed according to the departmental dose-optimization program which includes automated exposure control, adjustment of the mA and/or kV according to patient size and/or use of iterative reconstruction technique. COMPARISON:  10/01/2020, 09/23/2019, 09/27/2018 FINDINGS: Cardiovascular: Aortic atherosclerosis. Normal heart size. Left and  right coronary artery calcifications. No pericardial effusion. Mediastinum/Nodes: No enlarged mediastinal, hilar, or axillary lymph nodes. Thyroid gland, trachea, and esophagus demonstrate no significant findings. Lungs/Pleura: Moderate centrilobular and paraseptal emphysema. Unchanged mild pulmonary fibrosis in a pattern with apical to basal gradient, featuring irregular peripheral interstitial opacity, septal thickening, and ground-glass, mild, tubular bronchiectasis, with small areas of subpleural bronchiolectasis at the lung bases. No significant air trapping on expiratory phase imaging. Multiple small bilateral pulmonary nodules are stable and benign, largest a 0.9 cm nodule of the superior segment right lower lobe (series 4, image 57). No pleural effusion or pneumothorax. Upper Abdomen: No acute abnormality. Musculoskeletal: No chest wall abnormality. No suspicious osseous lesions identified. IMPRESSION: 1. Unchanged mild pulmonary fibrosis in a pattern with apical to basal gradient, featuring irregular peripheral interstitial opacity, septal thickening, and ground-glass, with small areas of subpleural bronchiolectasis at the lung bases. Fibrotic findings are not significantly changed compared to immediate prior examination, although as previously reported are worsened over a longer period of time dating back to at least 2015. Findings are categorized as probable UIP per consensus guidelines: Diagnosis of Idiopathic Pulmonary Fibrosis: An Official ATS/ERS/JRS/ALAT Clinical Practice Guideline. Am Rosezetta Schlatter Crit Care Med Vol 198, Iss 5, [phone], May 05 2017. 2. Multiple small bilateral pulmonary nodules are stable and definitively benign. 3. Emphysema. 4. Coronary artery disease. Aortic Atherosclerosis (ICD10-I70.0) and Emphysema (ICD10-J43.9). Electronically Signed   By: Jearld Lesch M.D.   On: 02/25/2022 17:51   DG HIP UNILAT WITH PELVIS 2-3 VIEWS RIGHT  Result Date: 02/25/2022 CLINICAL DATA:  Fall.   Right hip pain EXAM: DG HIP (WITH OR WITHOUT PELVIS) 2-3V RIGHT COMPARISON:  None FINDINGS: Acute intertrochanteric fracture right femur with mild displacement and mild angulation. Right hip joint space normal. IMPRESSION: Intertrochanteric fracture right femur with mild angulation and displacement. Electronically Signed   By: Marlan Palau M.D.   On: 02/25/2022 17:32    ____________________________________________   PROCEDURES  Procedure(s) performed:   Procedures  None  ____________________________________________   INITIAL IMPRESSION / ASSESSMENT AND PLAN / ED COURSE  Pertinent labs & imaging results that were available during my care of the patient were reviewed by me and considered in my medical decision making (see chart for details).   This patient is Presenting for Evaluation of hip pain, which does require a range of treatment options, and is a complaint that involves a high risk of morbidity and mortality.  The Differential Diagnoses include fracture, dislocation, compartment syndrome, contusion.  Critical Interventions-    Medications  fentaNYL (SUBLIMAZE) injection 25 mcg (has no administration in time range)    Reassessment after intervention: No significant pain at rest.   I did obtain Additional Historical Information from husband at bedside.   Clinical Laboratory Tests Ordered, included mild leukocytosis of unclear significance.  No infection symptoms.  No anemia.  No acute kidney injury or electrolyte disturbance.   Radiologic Tests Ordered, included DG right hip. I independently interpreted the images and agree with radiology interpretation.  Deferred chest x-ray as the patient received a CT of the chest, her planned outpatient study, prior to arrival in the emergency department.  Cardiac Monitor Tracing which shows NSR.   Social Determinants of Health Risk patient is a former  smoker.   Consult complete with  Orthopedics. Spoke with [employer].  Plan for admit to [facility], NPO after midnight, and surgery in the AM with Dr. Everardo Pacific.   Hospitalist. Dr. Adela Glimpse. Plan for admit to [facility].   Medical Decision Making: Summary:  Patient presents emergency department with right hip pain after fall today.  She has rotation and shortening of the right extremity but neurovascularly intact.  X-ray, as above, with intertrochanteric hip fracture.  We will reach out to orthopedics on-call.   Reevaluation with update and discussion with patient and husband.  They are in agreement with the plan.   Disposition: admit  ____________________________________________  FINAL CLINICAL IMPRESSION(S) / ED DIAGNOSES  Final diagnoses:  Closed fracture of right hip, initial encounter [facility])    Note:  This document was prepared using Dragon voice recognition software and may include unintentional dictation errors.  Alona Bene, MD, [facility] Emergency Medicine    Dail Lerew, Arlyss Repress, MD 02/25/22 [phone]

## 2022-02-26 ENCOUNTER — Inpatient Hospital Stay (HOSPITAL_COMMUNITY): Payer: Medicare Other

## 2022-02-26 ENCOUNTER — Encounter (HOSPITAL_COMMUNITY): Payer: Self-pay | Admitting: Internal Medicine

## 2022-02-26 ENCOUNTER — Inpatient Hospital Stay (HOSPITAL_COMMUNITY): Payer: Medicare Other | Admitting: Anesthesiology

## 2022-02-26 ENCOUNTER — Encounter (HOSPITAL_COMMUNITY)
Admission: EM | Disposition: A | Discharge status: Home / Self Care | Payer: Self-pay | Source: Home / Self Care | Attending: Internal Medicine

## 2022-02-26 ENCOUNTER — Other Ambulatory Visit: Payer: Self-pay

## 2022-02-26 DIAGNOSIS — I2510 Atherosclerotic heart disease of native coronary artery without angina pectoris: Secondary | ICD-10-CM | POA: Diagnosis not present

## 2022-02-26 DIAGNOSIS — J449 Chronic obstructive pulmonary disease, unspecified: Secondary | ICD-10-CM

## 2022-02-26 DIAGNOSIS — I10 Essential (primary) hypertension: Secondary | ICD-10-CM | POA: Diagnosis present

## 2022-02-26 DIAGNOSIS — S72141A Displaced intertrochanteric fracture of right femur, initial encounter for closed fracture: Secondary | ICD-10-CM

## 2022-02-26 DIAGNOSIS — E785 Hyperlipidemia, unspecified: Secondary | ICD-10-CM | POA: Diagnosis present

## 2022-02-26 DIAGNOSIS — R34 Anuria and oliguria: Secondary | ICD-10-CM | POA: Diagnosis present

## 2022-02-26 HISTORY — PX: INTRAMEDULLARY (IM) NAIL INTERTROCHANTERIC: SHX5875

## 2022-02-26 LAB — CBC
HCT: 35.9 % — ABNORMAL LOW (ref 36.0–46.0)
Hemoglobin: 12.4 g/dL (ref 12.0–15.0)
MCH: 33 pg (ref 26.0–34.0)
MCHC: 34.5 g/dL (ref 30.0–36.0)
MCV: 95.5 fL (ref 80.0–100.0)
Platelets: 243 10*3/uL (ref 150–400)
RBC: 3.76 MIL/uL — ABNORMAL LOW (ref 3.87–5.11)
RDW: 15.3 % (ref 11.5–15.5)
WBC: 9.5 10*3/uL (ref 4.0–10.5)
nRBC: 0 % (ref 0.0–0.2)

## 2022-02-26 LAB — COMPREHENSIVE METABOLIC PANEL
ALT: 23 U/L (ref 0–44)
AST: 29 U/L (ref 15–41)
Albumin: 3.4 g/dL — ABNORMAL LOW (ref 3.5–5.0)
Alkaline Phosphatase: 73 U/L (ref 38–126)
Anion gap: 9 (ref 5–15)
BUN: 13 mg/dL (ref 8–23)
CO2: 21 mmol/L — ABNORMAL LOW (ref 22–32)
Calcium: 8.7 mg/dL — ABNORMAL LOW (ref 8.9–10.3)
Chloride: 106 mmol/L (ref 98–111)
Creatinine, Ser: 0.75 mg/dL (ref 0.44–1.00)
GFR, Estimated: >60 mL/min (ref >60)
Glucose, Bld: 118 mg/dL — ABNORMAL HIGH (ref 70–99)
Potassium: 3.8 mmol/L (ref 3.5–5.1)
Sodium: 136 mmol/L (ref 135–145)
Total Bilirubin: 0.8 mg/dL (ref 0.3–1.2)
Total Protein: 6.9 g/dL (ref 6.5–8.1)

## 2022-02-26 LAB — MAGNESIUM: Magnesium: 2.1 mg/dL (ref 1.7–2.4)

## 2022-02-26 LAB — ABO/RH: ABO/RH(D): O POS

## 2022-02-26 LAB — TYPE AND SCREEN
ABO/RH(D): O POS
Antibody Screen: NEGATIVE

## 2022-02-26 LAB — CREATININE, SERUM
Creatinine, Ser: 0.7 mg/dL (ref 0.44–1.00)
GFR, Estimated: >60 mL/min (ref >60)

## 2022-02-26 LAB — VITAMIN D 25 HYDROXY (VIT D DEFICIENCY, FRACTURES): Vit D, 25-Hydroxy: 70.46 ng/mL (ref 30–100)

## 2022-02-26 LAB — SURGICAL PCR SCREEN
MRSA, PCR: NEGATIVE
Staphylococcus aureus: NEGATIVE

## 2022-02-26 LAB — GLUCOSE, CAPILLARY: Glucose-Capillary: 148 mg/dL — ABNORMAL HIGH (ref 70–99)

## 2022-02-26 SURGERY — FIXATION, FRACTURE, INTERTROCHANTERIC, WITH INTRAMEDULLARY ROD
Anesthesia: Spinal | Site: Leg Upper | Laterality: Right

## 2022-02-26 MED ORDER — PROPOFOL 500 MG/50ML IV EMUL
INTRAVENOUS | Status: DC | PRN
  Administered 2022-02-26: 100 ug/kg/min via INTRAVENOUS

## 2022-02-26 MED ORDER — BISACODYL 5 MG PO TBEC: 5.0000 mg | DELAYED_RELEASE_TABLET | Freq: Every day | ORAL | Status: DC | PRN

## 2022-02-26 MED ORDER — CEFAZOLIN SODIUM-DEXTROSE 2-4 GM/100ML-% IV SOLN
2.0000 g | Freq: Four times a day (QID) | INTRAVENOUS | Status: AC
  Administered 2022-02-26 (×2): 2 g via INTRAVENOUS
  Filled 2022-02-26 (×2): qty 100

## 2022-02-26 MED ORDER — MENTHOL 3 MG MT LOZG: 1.0000 | LOZENGE | OROMUCOSAL | Status: DC | PRN

## 2022-02-26 MED ORDER — SENNA 8.6 MG PO TABS
2.0000 | ORAL_TABLET | Freq: Every day | ORAL | Status: DC
  Administered 2022-02-26 – 2022-02-28 (×3): 17.2 mg via ORAL
  Filled 2022-02-26 (×3): qty 2

## 2022-02-26 MED ORDER — ATORVASTATIN CALCIUM 10 MG PO TABS
10.0000 mg | ORAL_TABLET | Freq: Every day | ORAL | Status: DC
  Administered 2022-02-26 – 2022-02-27 (×3): 10 mg via ORAL
  Filled 2022-02-26 (×3): qty 1

## 2022-02-26 MED ORDER — ONDANSETRON HCL 4 MG/2ML IJ SOLN
INTRAMUSCULAR | Status: DC | PRN
  Administered 2022-02-26: 4 mg via INTRAVENOUS

## 2022-02-26 MED ORDER — VANCOMYCIN HCL 1000 MG IV SOLR
INTRAVENOUS | Status: AC
  Filled 2022-02-26: qty 20

## 2022-02-26 MED ORDER — DILTIAZEM HCL 30 MG PO TABS
60.0000 mg | ORAL_TABLET | Freq: Three times a day (TID) | ORAL | Status: DC
Start: 2022-02-26 — End: 2022-02-28
  Administered 2022-02-26 – 2022-02-28 (×7): 60 mg via ORAL
  Filled 2022-02-26 (×6): qty 2

## 2022-02-26 MED ORDER — TRANEXAMIC ACID-NACL 1000-0.7 MG/100ML-% IV SOLN
1000.0000 mg | Freq: Once | INTRAVENOUS | Status: AC
  Administered 2022-02-26: 1000 mg via INTRAVENOUS
  Filled 2022-02-26: qty 100

## 2022-02-26 MED ORDER — FLUTICASONE FUROATE-VILANTEROL 100-25 MCG/ACT IN AEPB
1.0000 | INHALATION_SPRAY | Freq: Every day | RESPIRATORY_TRACT | Status: DC
  Administered 2022-02-27 – 2022-02-28 (×2): 1 via RESPIRATORY_TRACT
  Filled 2022-02-26: qty 28

## 2022-02-26 MED ORDER — CEFAZOLIN SODIUM-DEXTROSE 2-4 GM/100ML-% IV SOLN
2.0000 g | INTRAVENOUS | Status: AC
  Administered 2022-02-26: 2 g via INTRAVENOUS
  Filled 2022-02-26: qty 100

## 2022-02-26 MED ORDER — LACTATED RINGERS IV SOLN: INTRAVENOUS | Status: DC

## 2022-02-26 MED ORDER — DIPHENHYDRAMINE HCL 25 MG PO CAPS
50.0000 mg | ORAL_CAPSULE | Freq: Every day | ORAL | Status: DC
  Administered 2022-02-26 – 2022-02-27 (×2): 50 mg via ORAL
  Filled 2022-02-26 (×2): qty 2

## 2022-02-26 MED ORDER — CHLORHEXIDINE GLUCONATE 4 % EX LIQD: 60.0000 mL | Freq: Once | CUTANEOUS | Status: DC

## 2022-02-26 MED ORDER — FENTANYL CITRATE (PF) 250 MCG/5ML IJ SOLN
INTRAMUSCULAR | Status: AC
  Filled 2022-02-26: qty 5

## 2022-02-26 MED ORDER — ENOXAPARIN SODIUM 40 MG/0.4ML IJ SOSY
40.0000 mg | PREFILLED_SYRINGE | INTRAMUSCULAR | Status: DC
  Administered 2022-02-27 – 2022-02-28 (×2): 40 mg via SUBCUTANEOUS
  Filled 2022-02-26 (×2): qty 0.4

## 2022-02-26 MED ORDER — METHOCARBAMOL 1000 MG/10ML IJ SOLN: 500.0000 mg | Freq: Four times a day (QID) | INTRAVENOUS | Status: DC | PRN

## 2022-02-26 MED ORDER — HYDROCODONE-ACETAMINOPHEN 7.5-325 MG PO TABS
1.0000 | ORAL_TABLET | ORAL | Status: DC | PRN
  Administered 2022-02-26 – 2022-02-28 (×3): 2 via ORAL
  Filled 2022-02-26 (×3): qty 2

## 2022-02-26 MED ORDER — ORAL CARE MOUTH RINSE: 15.0000 mL | Freq: Once | OROMUCOSAL | Status: AC

## 2022-02-26 MED ORDER — HYDROCODONE-ACETAMINOPHEN 5-325 MG PO TABS
1.0000 | ORAL_TABLET | ORAL | Status: DC | PRN
  Administered 2022-02-26 (×2): 2 via ORAL
  Filled 2022-02-26 (×2): qty 2

## 2022-02-26 MED ORDER — METOCLOPRAMIDE HCL 5 MG PO TABS: 5.0000 mg | ORAL_TABLET | Freq: Three times a day (TID) | ORAL | Status: DC | PRN

## 2022-02-26 MED ORDER — METOCLOPRAMIDE HCL 5 MG/ML IJ SOLN: 5.0000 mg | Freq: Three times a day (TID) | INTRAMUSCULAR | Status: DC | PRN

## 2022-02-26 MED ORDER — POVIDONE-IODINE 10 % EX SWAB
2.0000 | Freq: Once | CUTANEOUS | Status: AC
Start: 2022-02-26 — End: 2022-02-26
  Administered 2022-02-26: 2 via TOPICAL

## 2022-02-26 MED ORDER — MORPHINE SULFATE (PF) 2 MG/ML IV SOLN: 0.5000 mg | INTRAVENOUS | Status: DC | PRN

## 2022-02-26 MED ORDER — PROPOFOL 10 MG/ML IV BOLUS
INTRAVENOUS | Status: DC | PRN
  Administered 2022-02-26: 20 mg via INTRAVENOUS
  Administered 2022-02-26: 10 mg via INTRAVENOUS
  Administered 2022-02-26 (×2): 20 mg via INTRAVENOUS

## 2022-02-26 MED ORDER — ACETAMINOPHEN 500 MG PO TABS
1000.0000 mg | ORAL_TABLET | Freq: Once | ORAL | Status: AC
  Administered 2022-02-26: 1000 mg via ORAL
  Filled 2022-02-26: qty 2

## 2022-02-26 MED ORDER — FENTANYL CITRATE (PF) 100 MCG/2ML IJ SOLN: 25.0000 ug | INTRAMUSCULAR | Status: DC | PRN

## 2022-02-26 MED ORDER — ACETAMINOPHEN 500 MG PO TABS
500.0000 mg | ORAL_TABLET | Freq: Four times a day (QID) | ORAL | Status: AC
  Administered 2022-02-26 (×2): 500 mg via ORAL
  Filled 2022-02-26 (×3): qty 1

## 2022-02-26 MED ORDER — FENTANYL CITRATE (PF) 250 MCG/5ML IJ SOLN
INTRAMUSCULAR | Status: DC | PRN
Start: 2022-02-26 — End: 2022-02-26
  Administered 2022-02-26: 50 ug via INTRAVENOUS

## 2022-02-26 MED ORDER — TRANEXAMIC ACID-NACL 1000-0.7 MG/100ML-% IV SOLN
1000.0000 mg | INTRAVENOUS | Status: AC
  Administered 2022-02-26: 1000 mg via INTRAVENOUS
  Filled 2022-02-26: qty 100

## 2022-02-26 MED ORDER — ONDANSETRON HCL 4 MG/2ML IJ SOLN
INTRAMUSCULAR | Status: AC
  Filled 2022-02-26: qty 2

## 2022-02-26 MED ORDER — PHENYLEPHRINE HCL-NACL 20-0.9 MG/250ML-% IV SOLN
INTRAVENOUS | Status: DC | PRN
  Administered 2022-02-26: 50 ug/min via INTRAVENOUS

## 2022-02-26 MED ORDER — CHLORHEXIDINE GLUCONATE 0.12 % MT SOLN
OROMUCOSAL | Status: AC
  Administered 2022-02-26: 15 mL via OROMUCOSAL
  Filled 2022-02-26: qty 15

## 2022-02-26 MED ORDER — ACETAMINOPHEN 325 MG PO TABS
325.0000 mg | ORAL_TABLET | Freq: Four times a day (QID) | ORAL | Status: DC | PRN
  Administered 2022-02-27: 325 mg via ORAL
  Administered 2022-02-27 – 2022-02-28 (×2): 650 mg via ORAL
  Filled 2022-02-26 (×3): qty 2

## 2022-02-26 MED ORDER — PROPOFOL 10 MG/ML IV BOLUS
INTRAVENOUS | Status: AC
  Filled 2022-02-26: qty 20

## 2022-02-26 MED ORDER — DOCUSATE SODIUM 100 MG PO CAPS
100.0000 mg | ORAL_CAPSULE | Freq: Two times a day (BID) | ORAL | Status: DC
  Administered 2022-02-26 – 2022-02-28 (×5): 100 mg via ORAL
  Filled 2022-02-26 (×5): qty 1

## 2022-02-26 MED ORDER — METHOCARBAMOL 500 MG PO TABS
500.0000 mg | ORAL_TABLET | Freq: Four times a day (QID) | ORAL | Status: DC | PRN
  Administered 2022-02-26 – 2022-02-27 (×3): 500 mg via ORAL
  Filled 2022-02-26 (×3): qty 1

## 2022-02-26 MED ORDER — CHLORHEXIDINE GLUCONATE 0.12 % MT SOLN: 15.0000 mL | Freq: Once | OROMUCOSAL | Status: AC

## 2022-02-26 MED ORDER — BUPIVACAINE IN DEXTROSE 0.75-8.25 % IT SOLN
INTRATHECAL | Status: DC | PRN
  Administered 2022-02-26: 12 mg via INTRATHECAL

## 2022-02-26 MED ORDER — POLYETHYLENE GLYCOL 3350 17 G PO PACK: 17.0000 g | PACK | Freq: Every day | ORAL | Status: DC | PRN

## 2022-02-26 MED ORDER — PHENOL 1.4 % MT LIQD
1.0000 | OROMUCOSAL | Status: DC | PRN
Start: 2022-02-26 — End: 2022-02-28

## 2022-02-26 MED ORDER — 0.9 % SODIUM CHLORIDE (POUR BTL) OPTIME
TOPICAL | Status: DC | PRN
  Administered 2022-02-26: 1000 mL

## 2022-02-26 MED ORDER — VANCOMYCIN HCL 1000 MG IV SOLR
INTRAVENOUS | Status: DC | PRN
  Administered 2022-02-26: 1000 mg via TOPICAL

## 2022-02-26 MED ORDER — ONDANSETRON HCL 4 MG PO TABS: 4.0000 mg | ORAL_TABLET | Freq: Four times a day (QID) | ORAL | Status: DC | PRN

## 2022-02-26 MED ORDER — MELATONIN 5 MG PO TABS
10.0000 mg | ORAL_TABLET | Freq: Every day | ORAL | Status: DC
  Administered 2022-02-26 – 2022-02-27 (×2): 10 mg via ORAL
  Filled 2022-02-26 (×2): qty 2

## 2022-02-26 MED ORDER — ONDANSETRON HCL 4 MG/2ML IJ SOLN: 4.0000 mg | Freq: Four times a day (QID) | INTRAMUSCULAR | Status: DC | PRN

## 2022-02-26 SURGICAL SUPPLY — 42 items
BAG COUNTER SPONGE SURGICOUNT (BAG) ×2 IMPLANT
BIT DRILL 4.0X280 (BIT) ×1 IMPLANT
BNDG COHESIVE 4X5 TAN STRL (GAUZE/BANDAGES/DRESSINGS) ×2 IMPLANT
BNDG COHESIVE 6X5 TAN ST LF (GAUZE/BANDAGES/DRESSINGS) ×1 IMPLANT
BNDG GAUZE ELAST 4 BULKY (GAUZE/BANDAGES/DRESSINGS) ×2 IMPLANT
CLSR STERI-STRIP ANTIMIC 1/2X4 (GAUZE/BANDAGES/DRESSINGS) ×3 IMPLANT
COVER PERINEAL POST (MISCELLANEOUS) ×2 IMPLANT
COVER SURGICAL LIGHT HANDLE (MISCELLANEOUS) ×2 IMPLANT
DRAPE STERI IOBAN 125X83 (DRAPES) ×2 IMPLANT
DRSG AQUACEL AG ADV 3.5X 4 (GAUZE/BANDAGES/DRESSINGS) ×1 IMPLANT
DRSG AQUACEL AG ADV 3.5X 6 (GAUZE/BANDAGES/DRESSINGS) ×2 IMPLANT
DURAPREP 26ML APPLICATOR (WOUND CARE) ×2 IMPLANT
ELECT REM PT RETURN 9FT ADLT (ELECTROSURGICAL) ×2
ELECTRODE REM PT RTRN 9FT ADLT (ELECTROSURGICAL) ×1 IMPLANT
GLOVE BIO SURGEON STRL SZ 6.5 (GLOVE) ×3 IMPLANT
GLOVE BIOGEL PI IND STRL 8 (GLOVE) ×1 IMPLANT
GLOVE BIOGEL PI INDICATOR 8 (GLOVE) ×1
GLOVE ECLIPSE 8.0 STRL XLNG CF (GLOVE) ×4 IMPLANT
GLOVE INDICATOR 6.5 STRL GRN (GLOVE) ×2 IMPLANT
GOWN STRL REUS W/ TWL LRG LVL3 (GOWN DISPOSABLE) ×2 IMPLANT
GOWN STRL REUS W/TWL 2XL LVL3 (GOWN DISPOSABLE) ×2 IMPLANT
GOWN STRL REUS W/TWL LRG LVL3 (GOWN DISPOSABLE) ×2
GUIDEWIRE BALL NOSE 3.0X900 (WIRE) ×1
GUIDEWIRE ORTH 900X3XBALL NOSE (WIRE) IMPLANT
KIT TRANSTIBIAL (DISPOSABLE) ×1 IMPLANT
KIT TURNOVER KIT B (KITS) ×2 IMPLANT
MANIFOLD NEPTUNE II (INSTRUMENTS) ×1 IMPLANT
NAIL 9X130X20 SHORT (Nail) ×1 IMPLANT
NS IRRIG 1000ML POUR BTL (IV SOLUTION) ×2 IMPLANT
PACK GENERAL/GYN (CUSTOM PROCEDURE TRAY) ×2 IMPLANT
PAD ARMBOARD 7.5X6 YLW CONV (MISCELLANEOUS) ×2 IMPLANT
PIN GUIDE THRD AR 3.2X330 (PIN) ×2 IMPLANT
SCREW CORTICAL 5.0X32 (Screw) ×1 IMPLANT
SCREW LAG GALILEO FEM 10.5X100 (Screw) ×1 IMPLANT
SUT MNCRL AB 4-0 PS2 18 (SUTURE) ×2 IMPLANT
SUT VIC AB 0 CT1 27 (SUTURE) ×1
SUT VIC AB 0 CT1 27XBRD ANBCTR (SUTURE) ×1 IMPLANT
SUT VIC AB 2-0 CT1 27 (SUTURE) ×1
SUT VIC AB 2-0 CT1 TAPERPNT 27 (SUTURE) ×1 IMPLANT
TOOL ACTIVATION (INSTRUMENTS) ×1 IMPLANT
TOWEL GREEN STERILE (TOWEL DISPOSABLE) ×2 IMPLANT
TOWEL GREEN STERILE FF (TOWEL DISPOSABLE) ×2 IMPLANT

## 2022-02-26 NOTE — Assessment & Plan Note (Signed)
Chronic stable continue Lipitor 10mg  daily

## 2022-02-26 NOTE — Anesthesia Postprocedure Evaluation (Signed)
Anesthesia Post Note  Patient: Erin Li  Procedure(s) Performed: INTRAMEDULLARY (IM) NAIL INTERTROCHANTRIC (Right: Leg Upper)     Patient location during evaluation: PACU Anesthesia Type: Spinal Level of consciousness: awake and alert, patient cooperative and oriented Pain management: pain level controlled Vital Signs Assessment: post-procedure vital signs reviewed and stable Respiratory status: spontaneous breathing, nonlabored ventilation and respiratory function stable Cardiovascular status: blood pressure returned to baseline and stable Postop Assessment: no apparent nausea or vomiting, patient able to bend at knees and spinal receding Anesthetic complications: no   No notable events documented.  Last Vitals:  Vitals:   02/26/22 0925 02/26/22 0940  BP: 114/66 117/63  Pulse: 79 74  Resp: 14 13  Temp:    SpO2: 95% 96%    Last Pain:  Vitals:   02/26/22 0940  TempSrc:   PainSc: 0-No pain                 Caryl Fate,E. Naidelin Gugliotta

## 2022-02-26 NOTE — Progress Notes (Signed)
Orthopedic Tech Progress Note Patient Details:  Erin Li [birth year] [id number]  An overhead frame is not permitted for this pt as she exceeds guidelines/restrictions for use: pt age >/= 95.  Patient ID: Erin Li, female   DOB: May 13, 1941, 81 y.o.   MRN: [id number]  Docia Furl 02/26/2022, 9:51 AM

## 2022-02-26 NOTE — Assessment & Plan Note (Signed)
Pt states she has not been urinating, will rehydrate, check bladder scan

## 2022-02-26 NOTE — Anesthesia Preprocedure Evaluation (Addendum)
Anesthesia Evaluation  Patient identified by MRN, date of birth, ID band Patient awake    Reviewed: Allergy & Precautions, NPO status , Patient's Chart, lab work & pertinent test results  History of Anesthesia Complications Negative for: history of anesthetic complications  Airway Mallampati: II  TM Distance: >3 FB Neck ROM: Full    Dental  (+) Edentulous Upper, Missing, Dental Advisory Given   Pulmonary COPD,  COPD inhaler, former smoker,    breath sounds clear to auscultation       Cardiovascular hypertension, Pt. on medications + CAD (remote angioplasty)  Cardiac stents: angioplasty only?   Rhythm:Regular Rate:Normal  '18 ECHO: EF 50-55%, normal LVF, mild AI   Neuro/Psych negative neurological ROS     GI/Hepatic negative GI ROS, Neg liver ROS,   Endo/Other  negative endocrine ROS  Renal/GU negative Renal ROS     Musculoskeletal  (+) Arthritis ,   Abdominal   Peds  Hematology negative hematology ROS (+)   Anesthesia Other Findings   Reproductive/Obstetrics                            Anesthesia Physical Anesthesia Plan  ASA: 3  Anesthesia Plan: Spinal   Post-op Pain Management: Tylenol PO (pre-op)*   Induction:   PONV Risk Score and Plan: 2 and Ondansetron and Treatment may vary due to age or medical condition  Airway Management Planned: Natural Airway and Simple Face Mask  Additional Equipment:   Intra-op Plan:   Post-operative Plan:   Informed Consent: I have reviewed the patients History and Physical, chart, labs and discussed the procedure including the risks, benefits and alternatives for the proposed anesthesia with the patient or authorized representative who has indicated his/her understanding and acceptance.     Dental advisory given  Plan Discussed with: CRNA and Surgeon  Anesthesia Plan Comments:        Anesthesia Quick Evaluation

## 2022-02-27 ENCOUNTER — Encounter (HOSPITAL_COMMUNITY): Payer: Self-pay | Admitting: Orthopaedic Surgery

## 2022-02-27 DIAGNOSIS — S72141A Displaced intertrochanteric fracture of right femur, initial encounter for closed fracture: Secondary | ICD-10-CM | POA: Diagnosis not present

## 2022-02-27 LAB — CBC
HCT: 33.8 % — ABNORMAL LOW (ref 36.0–46.0)
Hemoglobin: 11.4 g/dL — ABNORMAL LOW (ref 12.0–15.0)
MCH: 32.7 pg (ref 26.0–34.0)
MCHC: 33.7 g/dL (ref 30.0–36.0)
MCV: 96.8 fL (ref 80.0–100.0)
Platelets: 224 10*3/uL (ref 150–400)
RBC: 3.49 MIL/uL — ABNORMAL LOW (ref 3.87–5.11)
RDW: 15.7 % — ABNORMAL HIGH (ref 11.5–15.5)
WBC: 9.1 10*3/uL (ref 4.0–10.5)
nRBC: 0 % (ref 0.0–0.2)

## 2022-02-27 MED ORDER — OXYCODONE HCL 5 MG PO TABS: ORAL_TABLET | ORAL | 0 refills | Status: AC

## 2022-02-27 MED ORDER — ACETAMINOPHEN 500 MG PO TABS: 1000.0000 mg | ORAL_TABLET | Freq: Three times a day (TID) | ORAL | 0 refills | Status: AC

## 2022-02-27 MED ORDER — ADULT MULTIVITAMIN W/MINERALS CH
1.0000 | ORAL_TABLET | Freq: Every day | ORAL | Status: DC
  Administered 2022-02-27 – 2022-02-28 (×2): 1 via ORAL
  Filled 2022-02-27 (×2): qty 1

## 2022-02-27 MED ORDER — METHOCARBAMOL 500 MG PO TABS
500.0000 mg | ORAL_TABLET | Freq: Four times a day (QID) | ORAL | Status: DC | PRN
Start: 2022-02-27 — End: 2022-02-28
  Administered 2022-02-27: 500 mg via ORAL
  Filled 2022-02-27: qty 1

## 2022-02-27 MED ORDER — DEXTROSE 5 % IV SOLN: 500.0000 mg | Freq: Four times a day (QID) | INTRAVENOUS | Status: DC | PRN

## 2022-02-27 MED ORDER — RIVAROXABAN 10 MG PO TABS: 10.0000 mg | ORAL_TABLET | Freq: Every day | ORAL | 0 refills | Status: AC

## 2022-02-27 MED ORDER — ONDANSETRON HCL 4 MG PO TABS: 4.0000 mg | ORAL_TABLET | Freq: Three times a day (TID) | ORAL | 0 refills | Status: AC | PRN

## 2022-02-27 MED ORDER — ENSURE ENLIVE PO LIQD
237.0000 mL | ORAL | Status: DC
  Administered 2022-02-27: 237 mL via ORAL

## 2022-02-27 NOTE — Evaluation (Addendum)
Occupational Therapy Evaluation Patient Details Name: Erin Li MRN: [id number] DOB: 01-08-1941 Today's Date: 02/27/2022   History of Present Illness Pt is a 81 y/o female admitted s/p mechanical fall resulting in R intertrochanteric hip fx. Pt underwent right cephalomedullary nailing of R hip. PMH: COPD, HTN, diverticulosis.   Clinical Impression   PTA, pt lives with spouse and typically Independent in all daily tasks without need for AD. Pt presents now with mild post op pain and minor deficits in endurance and dynamic standing balance. Pt able to demo bathroom mobility using RW at min guard, UB ADLs with Setup Assist and Min A for LB ADLs. Educated re: activity progression, compensatory strategies for LB ADLs, use of BSC over toilet, and safety with showering tasks. Pt endorses that her husband can assist with ADLs/IADLs as needed. Anticipate pt to progress well without need for OT follow-up at DC. Will continue to follow acutely.       Recommendations for follow up therapy are one component of a multi-disciplinary discharge planning process, led by the attending physician.  Recommendations may be updated based on patient status, additional functional criteria and insurance authorization.   Follow Up Recommendations  No OT follow up    Assistance Recommended at Discharge Intermittent Supervision/Assistance  Patient can return home with the following A little help with bathing/dressing/bathroom;Assistance with cooking/housework;Assist for transportation;Help with stairs or ramp for entrance    Functional Status Assessment  Patient has had a recent decline in their functional status and demonstrates the ability to make significant improvements in function in a reasonable and predictable amount of time.  Equipment Recommendations  BSC/3in1;Other (comment) (RW)    Recommendations for Other Services       Precautions / Restrictions Precautions Precautions:  Fall Restrictions Weight Bearing Restrictions: Yes RLE Weight Bearing: Weight bearing as tolerated      Mobility Bed Mobility Overal bed mobility: Needs Assistance Bed Mobility: Supine to Sit     Supine to sit: Min assist, HOB elevated     General bed mobility comments: use of bedrails, light assist for R LE mgmt to EOB    Transfers Overall transfer level: Needs assistance Equipment used: Rolling walker (2 wheels) Transfers: Sit to/from Stand, Bed to chair/wheelchair/BSC Sit to Stand: Min guard     Step pivot transfers: Min guard     General transfer comment: minor cues for hand placement with good carryover, standing from bedside, reg toilet and recliner. initial step pivot to recliner to trial WB abilties prior to bathroom mobility      Balance Overall balance assessment: Needs assistance Sitting-balance support: No upper extremity supported, Feet supported Sitting balance-Leahy Scale: Fair     Standing balance support: Bilateral upper extremity supported, During functional activity, No upper extremity supported Standing balance-Leahy Scale: Fair                             ADL either performed or assessed with clinical judgement   ADL Overall ADL's : Needs assistance/impaired Eating/Feeding: Independent   Grooming: Set up;Sitting   Upper Body Bathing: Set up;Sitting   Lower Body Bathing: Supervison/ safety;Sitting/lateral leans;Sit to/from stand   Upper Body Dressing : Set up   Lower Body Dressing: Minimal assistance Lower Body Dressing Details (indicate cue type and reason): able to bend to reach B socks (L>R). educated on donning affected LE first, wearing clothes that are easier to manage Toilet Transfer: Min guard;Ambulation;Comfort height toilet;Rolling walker (2 wheels);Grab bars  Toilet Transfer Details (indicate cue type and reason): some difficulty descending to toilet without BSC over toilet nd sole use of grab bar. educated on [facility] use  at home if needed over toilet (placed at end of session for pt to trial next time) Toileting- Architect and Hygiene: Minimal assistance;Sit to/from stand Toileting - Clothing Manipulation Details (indicate cue type and reason): clothing mgmt assist     Functional mobility during ADLs: Min guard;Rolling walker (2 wheels) General ADL Comments: Pain well controlled with pt moving well. minor expected deficits in balance and flexibility in ability to reach feet     Vision Baseline Vision/History: 1 Wears glasses Ability to See in Adequate Light: 0 Adequate Patient Visual Report: No change from baseline Vision Assessment?: No apparent visual deficits     Perception     Praxis      Pertinent Vitals/Pain Pain Assessment Pain Assessment: 0-10 Pain Score: 5  Pain Location: R hip Pain Descriptors / Indicators: Guarding, Sore Pain Intervention(s): Monitored during session, Premedicated before session, Limited activity within patient's tolerance, Repositioned, Ice applied     Hand Dominance Right   Extremity/Trunk Assessment Upper Extremity Assessment Upper Extremity Assessment: Overall WFL for tasks assessed   Lower Extremity Assessment Lower Extremity Assessment: Defer to PT evaluation   Cervical / Trunk Assessment Cervical / Trunk Assessment: Normal   Communication Communication Communication: HOH   Cognition Arousal/Alertness: Awake/alert Behavior During Therapy: WFL for tasks assessed/performed Overall Cognitive Status: Within Functional Limits for tasks assessed                                       General Comments  SpO2 WFL on RA Discussed discontinuing BR orders as therapy eval orders placed as well.    Exercises     Shoulder Instructions      Home Living Family/patient expects to be discharged to:: Private residence Living Arrangements: Spouse/significant other Available Help at Discharge: Family Type of Home: House Home Access:  Stairs to enter Secretary/administrator of Steps: 1 Entrance Stairs-Rails: Right Home Layout: One level     Bathroom Shower/Tub: Tub/shower unit;Walk-in shower   Bathroom Toilet: Handicapped height     Home Equipment: Shower seat - built in;Grab bars - toilet;Grab bars - tub/shower;Hand held shower head          Prior Functioning/Environment Prior Level of Function : Independent/Modified Independent;Driving;Working/employed             Mobility Comments: no use of AD ADLs Comments: Independent with ADLs, IADLs. Works a few days a week        OT Problem List: Decreased activity tolerance;Impaired balance (sitting and/or standing);Pain;Decreased knowledge of use of DME or AE      OT Treatment/Interventions: Self-care/ADL training;Therapeutic exercise;Energy conservation;DME and/or AE instruction;Therapeutic activities;Patient/family education;Balance training    OT Goals(Current goals can be found in the care plan section) Acute Rehab OT Goals Patient Stated Goal: be able to go straight home OT Goal Formulation: With patient Time For Goal Achievement: 03/13/22 Potential to Achieve Goals: Good ADL Goals Pt Will Perform Lower Body Dressing: with modified independence;sit to/from stand Pt Will Transfer to Toilet: with modified independence;ambulating Pt Will Perform Toileting - Clothing Manipulation and hygiene: with modified independence;sit to/from stand Additional ADL Goal #1: Pt to demo ability to gather ADL/IADL items MOD I without LOB or safety concerns  OT Frequency: Min 2X/week    Co-evaluation  AM-PAC OT "6 Clicks" Daily Activity     Outcome Measure Help from another person eating meals?: None Help from another person taking care of personal grooming?: A Little Help from another person toileting, which includes using toliet, bedpan, or urinal?: A Little Help from another person bathing (including washing, rinsing, drying)?: A Little Help from  another person to put on and taking off regular upper body clothing?: A Little Help from another person to put on and taking off regular lower body clothing?: A Little 6 Click Score: 19   End of Session Equipment Utilized During Treatment: Rolling walker (2 wheels);Gait belt  Activity Tolerance: Patient tolerated treatment well Patient left: in chair;with call bell/phone within reach;with chair alarm set  OT Visit Diagnosis: Unsteadiness on feet (R26.81)                Time: 2130-8657 OT Time Calculation (min): 36 min Charges:  OT General Charges $OT Visit: 1 Visit OT Evaluation $OT Eval Low Complexity: 1 Low OT Treatments $Self Care/Home Management : 8-22 mins  Bradd Canary, OTR/L Acute Rehab Services Office: [phone]   Lorre Munroe 02/27/2022, 8:24 AM

## 2022-02-27 NOTE — Assessment & Plan Note (Signed)
Seems stable. -Continue home meds and bronchodilators

## 2022-02-27 NOTE — Assessment & Plan Note (Signed)
Blood pressure within goal. -Continue home dose of Cardizem

## 2022-02-27 NOTE — TOC CAGE-AID Note (Signed)
Transition of Care [facility]) - CAGE-AID Screening   Patient Details  Name: Erin Li MRN: [id number] Date of Birth: 1940/12/04  Clinical Narrative:  Patient endorses occasional alcohol use, a couple of glasses of wine a week. Never drinks in excess and does not feel she needs to cut down. Husband at bedside and endorses that she does not drink more than occasionally. No other substance abuse. Patient offered resources but denies need at this time.  CAGE-AID Screening:    Have You Ever Felt You Ought to Cut Down on Your Drinking or Drug Use?: No Have People Annoyed You By Critizing Your Drinking Or Drug Use?: No Have You Felt Bad Or Guilty About Your Drinking Or Drug Use?: No Have You Ever Had a Drink or Used Drugs First Thing In The Morning to Steady Your Nerves or to Get Rid of a Hangover?: No CAGE-AID Score: 0  Substance Abuse Education Offered: Yes

## 2022-02-28 DIAGNOSIS — R34 Anuria and oliguria: Secondary | ICD-10-CM | POA: Diagnosis not present

## 2022-02-28 DIAGNOSIS — J449 Chronic obstructive pulmonary disease, unspecified: Secondary | ICD-10-CM | POA: Diagnosis not present

## 2022-02-28 DIAGNOSIS — S72141A Displaced intertrochanteric fracture of right femur, initial encounter for closed fracture: Secondary | ICD-10-CM | POA: Diagnosis not present

## 2022-02-28 DIAGNOSIS — I10 Essential (primary) hypertension: Secondary | ICD-10-CM | POA: Diagnosis not present

## 2022-02-28 MED ORDER — ALBUTEROL SULFATE HFA 108 (90 BASE) MCG/ACT IN AERS: 2.0000 | INHALATION_SPRAY | Freq: Four times a day (QID) | RESPIRATORY_TRACT | 2 refills | Status: DC | PRN

## 2022-02-28 MED ORDER — DILTIAZEM HCL 60 MG PO TABS: 60.0000 mg | ORAL_TABLET | Freq: Three times a day (TID) | ORAL | Status: DC

## 2022-02-28 MED ORDER — METHOCARBAMOL 500 MG PO TABS: 500.0000 mg | ORAL_TABLET | Freq: Four times a day (QID) | ORAL | 0 refills | Status: AC | PRN

## 2022-02-28 MED ORDER — DOCUSATE SODIUM 100 MG PO CAPS: 100.0000 mg | ORAL_CAPSULE | Freq: Two times a day (BID) | ORAL | 0 refills | Status: AC

## 2022-02-28 NOTE — Progress Notes (Signed)
SATURATION QUALIFICATIONS: (This note is used to comply with regulatory documentation for home oxygen)  Patient Saturations on Room Air at Rest = 92%  Patient Saturations on Room Air while Ambulating = 85%  Patient Saturations on 2 Liters of oxygen while Ambulating = 96%  Please briefly explain why patient needs home oxygen: To maintain oxygen saturation > 90% when ambulating.   Lillia Pauls, PT, DPT Acute Rehabilitation Services Office [phone]

## 2022-03-01 ENCOUNTER — Telehealth: Payer: Self-pay | Admitting: Internal Medicine

## 2022-03-01 NOTE — Telephone Encounter (Signed)
Called and spoke with patient who is calling about getting some prescriptions for when she goes out of the country in August. She was scheduled for Friday 03/03/22 but states that she fell and broke her hip and has had to reschedule. Patient is now scheduled for September.   [facility]avel Advice - [place] 04/23/22- 05/01/22    - can dispense antibiotic, prednisone and antiviral for travel but during followup in June/July 2023     Dr. Chase Caller please advise

## 2022-03-01 NOTE — Telephone Encounter (Signed)
       Paxlovid (nirmatelvir 300/Ritonavir100) - BID x 5 days - for GFR >= 60 (nl creat June 2023) - during this time hold lipitor  Take doxycycline '100mg'$  po twice daily x 5 days; take after meals and avoid sunlight   Please take prednisone 40 mg x1 day, then 30 mg x1 day, then 20 mg x1 day, then 10 mg x1 day, and then 5 mg x1 day and stop     No Known Allergies

## 2022-03-02 ENCOUNTER — Telehealth: Payer: Self-pay | Admitting: Internal Medicine

## 2022-03-02 DIAGNOSIS — R911 Solitary pulmonary nodule: Secondary | ICD-10-CM | POA: Diagnosis not present

## 2022-03-02 DIAGNOSIS — I2510 Atherosclerotic heart disease of native coronary artery without angina pectoris: Secondary | ICD-10-CM | POA: Diagnosis not present

## 2022-03-02 DIAGNOSIS — E785 Hyperlipidemia, unspecified: Secondary | ICD-10-CM | POA: Diagnosis not present

## 2022-03-02 DIAGNOSIS — Z9181 History of falling: Secondary | ICD-10-CM | POA: Diagnosis not present

## 2022-03-02 DIAGNOSIS — K5790 Diverticulosis of intestine, part unspecified, without perforation or abscess without bleeding: Secondary | ICD-10-CM | POA: Diagnosis not present

## 2022-03-02 DIAGNOSIS — Z7901 Long term (current) use of anticoagulants: Secondary | ICD-10-CM | POA: Diagnosis not present

## 2022-03-02 DIAGNOSIS — S7291XD Unspecified fracture of right femur, subsequent encounter for closed fracture with routine healing: Secondary | ICD-10-CM | POA: Diagnosis not present

## 2022-03-02 DIAGNOSIS — I10 Essential (primary) hypertension: Secondary | ICD-10-CM | POA: Diagnosis not present

## 2022-03-02 DIAGNOSIS — J449 Chronic obstructive pulmonary disease, unspecified: Secondary | ICD-10-CM | POA: Diagnosis not present

## 2022-03-02 DIAGNOSIS — J849 Interstitial pulmonary disease, unspecified: Secondary | ICD-10-CM | POA: Diagnosis not present

## 2022-03-02 MED ORDER — DOXYCYCLINE HYCLATE 100 MG PO TABS: 100.0000 mg | ORAL_TABLET | Freq: Two times a day (BID) | ORAL | 0 refills | Status: DC

## 2022-03-02 MED ORDER — PREDNISONE 10 MG PO TABS: ORAL_TABLET | ORAL | 0 refills | Status: AC

## 2022-03-02 MED ORDER — NIRMATRELVIR/RITONAVIR (PAXLOVID)TABLET: 3.0000 | ORAL_TABLET | Freq: Two times a day (BID) | ORAL | 0 refills | Status: DC

## 2022-03-02 MED ORDER — NIRMATRELVIR/RITONAVIR (PAXLOVID)TABLET: 3.0000 | ORAL_TABLET | Freq: Two times a day (BID) | ORAL | 0 refills | Status: AC

## 2022-03-02 NOTE — Telephone Encounter (Signed)
Called and spoke with patient to let her know that paxlovid rx needs to be sent to [facility] in [place], she expressed understanding and said it was fine to send it to the [facility]. RX has been sent. Called pharmacy to let them know. Nothing further needed at this time.

## 2022-03-02 NOTE — Telephone Encounter (Signed)
Called and spoke with patient to let her know that Dr. Chase Caller is ok with me sending in RX for her to take with her in August. She verified pharmacy. 3 prescriptions have been sent in. Nothing further needed at this time.

## 2022-03-03 ENCOUNTER — Ambulatory Visit: Payer: Medicare Other | Admitting: Internal Medicine

## 2022-03-03 DIAGNOSIS — J449 Chronic obstructive pulmonary disease, unspecified: Secondary | ICD-10-CM | POA: Diagnosis not present

## 2022-03-03 DIAGNOSIS — R911 Solitary pulmonary nodule: Secondary | ICD-10-CM | POA: Diagnosis not present

## 2022-03-04 DIAGNOSIS — Z7901 Long term (current) use of anticoagulants: Secondary | ICD-10-CM | POA: Diagnosis not present

## 2022-03-04 DIAGNOSIS — J849 Interstitial pulmonary disease, unspecified: Secondary | ICD-10-CM | POA: Diagnosis not present

## 2022-03-04 DIAGNOSIS — Z9181 History of falling: Secondary | ICD-10-CM | POA: Diagnosis not present

## 2022-03-04 DIAGNOSIS — I10 Essential (primary) hypertension: Secondary | ICD-10-CM | POA: Diagnosis not present

## 2022-03-04 DIAGNOSIS — E785 Hyperlipidemia, unspecified: Secondary | ICD-10-CM | POA: Diagnosis not present

## 2022-03-04 DIAGNOSIS — S7291XD Unspecified fracture of right femur, subsequent encounter for closed fracture with routine healing: Secondary | ICD-10-CM | POA: Diagnosis not present

## 2022-03-04 DIAGNOSIS — K5790 Diverticulosis of intestine, part unspecified, without perforation or abscess without bleeding: Secondary | ICD-10-CM | POA: Diagnosis not present

## 2022-03-04 DIAGNOSIS — J449 Chronic obstructive pulmonary disease, unspecified: Secondary | ICD-10-CM | POA: Diagnosis not present

## 2022-03-04 DIAGNOSIS — I2510 Atherosclerotic heart disease of native coronary artery without angina pectoris: Secondary | ICD-10-CM | POA: Diagnosis not present

## 2022-03-04 DIAGNOSIS — R911 Solitary pulmonary nodule: Secondary | ICD-10-CM | POA: Diagnosis not present

## 2022-03-06 DIAGNOSIS — R911 Solitary pulmonary nodule: Secondary | ICD-10-CM | POA: Diagnosis not present

## 2022-03-06 DIAGNOSIS — I2510 Atherosclerotic heart disease of native coronary artery without angina pectoris: Secondary | ICD-10-CM | POA: Diagnosis not present

## 2022-03-06 DIAGNOSIS — E785 Hyperlipidemia, unspecified: Secondary | ICD-10-CM | POA: Diagnosis not present

## 2022-03-06 DIAGNOSIS — Z7901 Long term (current) use of anticoagulants: Secondary | ICD-10-CM | POA: Diagnosis not present

## 2022-03-06 DIAGNOSIS — J449 Chronic obstructive pulmonary disease, unspecified: Secondary | ICD-10-CM | POA: Diagnosis not present

## 2022-03-06 DIAGNOSIS — I10 Essential (primary) hypertension: Secondary | ICD-10-CM | POA: Diagnosis not present

## 2022-03-06 DIAGNOSIS — S7291XD Unspecified fracture of right femur, subsequent encounter for closed fracture with routine healing: Secondary | ICD-10-CM | POA: Diagnosis not present

## 2022-03-06 DIAGNOSIS — J849 Interstitial pulmonary disease, unspecified: Secondary | ICD-10-CM | POA: Diagnosis not present

## 2022-03-06 DIAGNOSIS — K5790 Diverticulosis of intestine, part unspecified, without perforation or abscess without bleeding: Secondary | ICD-10-CM | POA: Diagnosis not present

## 2022-03-06 DIAGNOSIS — Z9181 History of falling: Secondary | ICD-10-CM | POA: Diagnosis not present

## 2022-03-08 DIAGNOSIS — I10 Essential (primary) hypertension: Secondary | ICD-10-CM | POA: Diagnosis not present

## 2022-03-08 DIAGNOSIS — Z9181 History of falling: Secondary | ICD-10-CM | POA: Diagnosis not present

## 2022-03-08 DIAGNOSIS — J849 Interstitial pulmonary disease, unspecified: Secondary | ICD-10-CM | POA: Diagnosis not present

## 2022-03-08 DIAGNOSIS — R911 Solitary pulmonary nodule: Secondary | ICD-10-CM | POA: Diagnosis not present

## 2022-03-08 DIAGNOSIS — Z8781 Personal history of (healed) traumatic fracture: Secondary | ICD-10-CM | POA: Diagnosis not present

## 2022-03-08 DIAGNOSIS — Z09 Encounter for follow-up examination after completed treatment for conditions other than malignant neoplasm: Secondary | ICD-10-CM | POA: Diagnosis not present

## 2022-03-08 DIAGNOSIS — M810 Age-related osteoporosis without current pathological fracture: Secondary | ICD-10-CM | POA: Diagnosis not present

## 2022-03-08 DIAGNOSIS — K5790 Diverticulosis of intestine, part unspecified, without perforation or abscess without bleeding: Secondary | ICD-10-CM | POA: Diagnosis not present

## 2022-03-08 DIAGNOSIS — I2510 Atherosclerotic heart disease of native coronary artery without angina pectoris: Secondary | ICD-10-CM | POA: Diagnosis not present

## 2022-03-08 DIAGNOSIS — S7291XD Unspecified fracture of right femur, subsequent encounter for closed fracture with routine healing: Secondary | ICD-10-CM | POA: Diagnosis not present

## 2022-03-08 DIAGNOSIS — J449 Chronic obstructive pulmonary disease, unspecified: Secondary | ICD-10-CM | POA: Diagnosis not present

## 2022-03-08 DIAGNOSIS — Z7901 Long term (current) use of anticoagulants: Secondary | ICD-10-CM | POA: Diagnosis not present

## 2022-03-08 DIAGNOSIS — E785 Hyperlipidemia, unspecified: Secondary | ICD-10-CM | POA: Diagnosis not present

## 2022-03-09 DIAGNOSIS — Z7901 Long term (current) use of anticoagulants: Secondary | ICD-10-CM | POA: Diagnosis not present

## 2022-03-09 DIAGNOSIS — I2510 Atherosclerotic heart disease of native coronary artery without angina pectoris: Secondary | ICD-10-CM | POA: Diagnosis not present

## 2022-03-09 DIAGNOSIS — Z9181 History of falling: Secondary | ICD-10-CM | POA: Diagnosis not present

## 2022-03-09 DIAGNOSIS — S72141D Displaced intertrochanteric fracture of right femur, subsequent encounter for closed fracture with routine healing: Secondary | ICD-10-CM | POA: Diagnosis not present

## 2022-03-09 DIAGNOSIS — J449 Chronic obstructive pulmonary disease, unspecified: Secondary | ICD-10-CM | POA: Diagnosis not present

## 2022-03-09 DIAGNOSIS — I10 Essential (primary) hypertension: Secondary | ICD-10-CM | POA: Diagnosis not present

## 2022-03-09 DIAGNOSIS — R911 Solitary pulmonary nodule: Secondary | ICD-10-CM | POA: Diagnosis not present

## 2022-03-09 DIAGNOSIS — K5790 Diverticulosis of intestine, part unspecified, without perforation or abscess without bleeding: Secondary | ICD-10-CM | POA: Diagnosis not present

## 2022-03-09 DIAGNOSIS — E785 Hyperlipidemia, unspecified: Secondary | ICD-10-CM | POA: Diagnosis not present

## 2022-03-09 DIAGNOSIS — J849 Interstitial pulmonary disease, unspecified: Secondary | ICD-10-CM | POA: Diagnosis not present

## 2022-03-09 DIAGNOSIS — S7291XD Unspecified fracture of right femur, subsequent encounter for closed fracture with routine healing: Secondary | ICD-10-CM | POA: Diagnosis not present

## 2022-03-13 ENCOUNTER — Encounter: Payer: Self-pay | Admitting: Internal Medicine

## 2022-03-13 DIAGNOSIS — S7291XD Unspecified fracture of right femur, subsequent encounter for closed fracture with routine healing: Secondary | ICD-10-CM | POA: Diagnosis not present

## 2022-03-13 DIAGNOSIS — Z9181 History of falling: Secondary | ICD-10-CM | POA: Diagnosis not present

## 2022-03-13 DIAGNOSIS — Z7901 Long term (current) use of anticoagulants: Secondary | ICD-10-CM | POA: Diagnosis not present

## 2022-03-13 DIAGNOSIS — I2510 Atherosclerotic heart disease of native coronary artery without angina pectoris: Secondary | ICD-10-CM | POA: Diagnosis not present

## 2022-03-13 DIAGNOSIS — J849 Interstitial pulmonary disease, unspecified: Secondary | ICD-10-CM | POA: Diagnosis not present

## 2022-03-13 DIAGNOSIS — I10 Essential (primary) hypertension: Secondary | ICD-10-CM | POA: Diagnosis not present

## 2022-03-13 DIAGNOSIS — E785 Hyperlipidemia, unspecified: Secondary | ICD-10-CM | POA: Diagnosis not present

## 2022-03-13 DIAGNOSIS — K5790 Diverticulosis of intestine, part unspecified, without perforation or abscess without bleeding: Secondary | ICD-10-CM | POA: Diagnosis not present

## 2022-03-13 DIAGNOSIS — R911 Solitary pulmonary nodule: Secondary | ICD-10-CM | POA: Diagnosis not present

## 2022-03-13 DIAGNOSIS — J449 Chronic obstructive pulmonary disease, unspecified: Secondary | ICD-10-CM | POA: Diagnosis not present

## 2022-03-14 DIAGNOSIS — S7291XD Unspecified fracture of right femur, subsequent encounter for closed fracture with routine healing: Secondary | ICD-10-CM | POA: Diagnosis not present

## 2022-03-14 DIAGNOSIS — J449 Chronic obstructive pulmonary disease, unspecified: Secondary | ICD-10-CM | POA: Diagnosis not present

## 2022-03-14 DIAGNOSIS — E785 Hyperlipidemia, unspecified: Secondary | ICD-10-CM | POA: Diagnosis not present

## 2022-03-14 DIAGNOSIS — K5790 Diverticulosis of intestine, part unspecified, without perforation or abscess without bleeding: Secondary | ICD-10-CM | POA: Diagnosis not present

## 2022-03-14 DIAGNOSIS — J849 Interstitial pulmonary disease, unspecified: Secondary | ICD-10-CM | POA: Diagnosis not present

## 2022-03-14 DIAGNOSIS — R911 Solitary pulmonary nodule: Secondary | ICD-10-CM | POA: Diagnosis not present

## 2022-03-14 DIAGNOSIS — Z7901 Long term (current) use of anticoagulants: Secondary | ICD-10-CM | POA: Diagnosis not present

## 2022-03-14 DIAGNOSIS — I2510 Atherosclerotic heart disease of native coronary artery without angina pectoris: Secondary | ICD-10-CM | POA: Diagnosis not present

## 2022-03-14 DIAGNOSIS — I10 Essential (primary) hypertension: Secondary | ICD-10-CM | POA: Diagnosis not present

## 2022-03-14 DIAGNOSIS — Z9181 History of falling: Secondary | ICD-10-CM | POA: Diagnosis not present

## 2022-03-14 NOTE — Telephone Encounter (Signed)
MR, please see pt's email. She is requesting to d/c her home O2 that she was provided with at hospital d/c.

## 2022-03-20 ENCOUNTER — Encounter: Payer: Self-pay | Admitting: Internal Medicine

## 2022-03-20 DIAGNOSIS — J449 Chronic obstructive pulmonary disease, unspecified: Secondary | ICD-10-CM

## 2022-03-22 NOTE — Telephone Encounter (Signed)
Ok to dc o2

## 2022-03-29 DIAGNOSIS — J449 Chronic obstructive pulmonary disease, unspecified: Secondary | ICD-10-CM | POA: Diagnosis not present

## 2022-03-29 DIAGNOSIS — S7291XD Unspecified fracture of right femur, subsequent encounter for closed fracture with routine healing: Secondary | ICD-10-CM | POA: Diagnosis not present

## 2022-03-29 DIAGNOSIS — I10 Essential (primary) hypertension: Secondary | ICD-10-CM | POA: Diagnosis not present

## 2022-03-29 DIAGNOSIS — J849 Interstitial pulmonary disease, unspecified: Secondary | ICD-10-CM | POA: Diagnosis not present

## 2022-04-07 DIAGNOSIS — M542 Cervicalgia: Secondary | ICD-10-CM | POA: Diagnosis not present

## 2022-04-07 DIAGNOSIS — S72141D Displaced intertrochanteric fracture of right femur, subsequent encounter for closed fracture with routine healing: Secondary | ICD-10-CM | POA: Diagnosis not present

## 2022-04-15 ENCOUNTER — Encounter: Payer: Self-pay | Admitting: Internal Medicine

## 2022-05-03 ENCOUNTER — Other Ambulatory Visit: Payer: Self-pay | Admitting: Internal Medicine

## 2022-05-18 ENCOUNTER — Ambulatory Visit: Payer: Medicare Other | Admitting: Internal Medicine

## 2022-05-18 ENCOUNTER — Ambulatory Visit (INDEPENDENT_AMBULATORY_CARE_PROVIDER_SITE_OTHER): Payer: Medicare Other | Admitting: Internal Medicine

## 2022-05-18 ENCOUNTER — Encounter: Payer: Self-pay | Admitting: Internal Medicine

## 2022-05-18 VITALS — BP 114/78 | HR 78 | Temp 98.2°F | Ht 65.0 in | Wt 140.6 lb

## 2022-05-18 DIAGNOSIS — J849 Interstitial pulmonary disease, unspecified: Secondary | ICD-10-CM

## 2022-05-18 DIAGNOSIS — J441 Chronic obstructive pulmonary disease with (acute) exacerbation: Secondary | ICD-10-CM

## 2022-05-18 DIAGNOSIS — J449 Chronic obstructive pulmonary disease, unspecified: Secondary | ICD-10-CM | POA: Diagnosis not present

## 2022-05-18 DIAGNOSIS — Z7185 Encounter for immunization safety counseling: Secondary | ICD-10-CM

## 2022-05-18 LAB — PULMONARY FUNCTION TEST
DL/VA % pred: 63 %
DL/VA: 2.59 ml/min/mmHg/L
DLCO cor % pred: 54 %
DLCO cor: 10.58 ml/min/mmHg
DLCO unc % pred: 54 %
DLCO unc: 10.58 ml/min/mmHg
FEF 25-75 Pre: 0.66 L/sec
FEF2575-%Pred-Pre: 43 %
FEV1-%Pred-Pre: 67 %
FEV1-Pre: 1.41 L
FEV1FVC-%Pred-Pre: 87 %
FEV6-%Pred-Pre: 81 %
FEV6-Pre: 2.13 L
FEV6FVC-%Pred-Pre: 103 %
FVC-%Pred-Pre: 78 %
FVC-Pre: 2.17 L
Pre FEV1/FVC ratio: 65 %
Pre FEV6/FVC Ratio: 98 %

## 2022-05-18 MED ORDER — PREDNISONE 10 MG PO TABS: ORAL_TABLET | ORAL | 0 refills | Status: AC

## 2022-05-18 MED ORDER — DOXYCYCLINE HYCLATE 100 MG PO TABS: 100.0000 mg | ORAL_TABLET | Freq: Two times a day (BID) | ORAL | 0 refills | Status: DC

## 2022-05-18 NOTE — Progress Notes (Signed)
OV 04/10/2017   Chief Complaint  Patient presents with   Follow-up    Pt states her breathing is unchanged since last OV. Pt states she has mild non prod cough. Pt denies CP/tightness, f/c/s, and chest congestion.    Follow-up COPD but now has concerned that she has developed interstitial lung disease  She is here with her husband. Back in 2015 a high-resolution CT chest because I thought I heard crackles and thoracic radiology ruled out interstitial lung disease. In the interim she's had lung cancer screening CT chest at overall stability in her pulmonary function test. But there was concern because of crackles that she might have interstitial lung disease so we did a high-resolution CT chest and July 2018 and thoracic radiologist reported as definite precedence of ILD although it is not a UIP pattern. [name] She does say that her son and over the lung transplant in [place] 8 years ago at age of 12 because of IPF. She did have autoimmune panel that is only trace positive for ENP . 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 She denies any humidifier or mildew or mold in the house. The CT chest also shows coronary artery calcification. Of note I personally visualized the CT chest.  ACCP ILD questiin  - symptioms: occ cough x 2 years with some night cough and mild phleogn ona nd off. Very mild non troubling dyspnea x 3 years - pasth medical : GERD +, mild dysphagia + - personal exposure x -: +ver for remote use of rec / street drugs. Cig smoked age 3 though 50 at 17 cigs/dat - fam hx: COPD +, son with transplant for ?  IPF at age 68 - home exposurE: does have hot tube - uses it once a month. Has a Macaw bird x > 20 years - residence: [place], [place], [place], [place], [place] - occupatin: bookeeper - pulmotiox hx: negagive  Results for MUNIRAH, DOERNER (MRN [id number])  as of 04/10/2017 11:21  Ref. Range 04/03/2017 10:18  CK Total Latest Ref Range: 7 - 177 U/L 63  CK-MB Latest Ref Range: 0.3 - 4.0 ng/mL 2.4  Aldolase Latest Ref Range: <=8.1 U/L 3.5  Sed Rate Latest Ref Range: 0 - 30 mm/hr 20  Anit Nuclear Antibody(ANA) Latest Ref Range: NEGATIVE  NEG  Angiotensin-Converting Enzyme Latest Ref Range: 9 - 67 U/L 67  Cyclic Citrullin Peptide Ab Latest Units: Units <16  ds DNA Ab Latest Units: IU/mL 1  ENA RNP Ab Latest Ref Range: 0.0 - 0.9 AI 1.4 (H)  RA Latex Turbid. Latest Ref Range: <14 IU/mL <14  SSA (Ro) (ENA) Antibody, IgG Latest Ref Range: <1.0 NEG AI  <1.0 NEG  SSB (La) (ENA) Antibody, IgG Latest Ref Range: <1.0 NEG AI  <1.0 NEG  Scleroderma (Scl-70) (ENA) Antibody, IgG Latest Ref Range: <1.0 NEG AI  <1.0 NEG    OV 09/28/2017  Chief Complaint  Patient presents with   Follow-up    PFT done today. States she has been doing good and has no complaints of cough, SOB, or CP.    Fu mixed empyhsema and pulmonary fibrosis  For  years of only known who is a COPD patient with a family history of pulmonary fibrosis.  But it appears that in 2018 she started developing signs of pulmonary fibrosis with  crackles and findings of indeterminate UIP on CT chest July 2018.  She has family history of pulmonary fibrosis and that her son had lung transplant for the same.  She now presents for follow-up.  In the interim she started feeling better.  She is less dyspneic.  She continues with the Brio.  There are no new issues.  Pulmonary function test done as documented below shows an improvement.  Therefore instead of biopsy she feels keeping an eye on this is a better approach.    Walking desaturation test on 09/28/2017 185 feet x 3 laps on ROOM AIR:  did NOT desaturate. Rest pulse ox was 99%, final pulse ox was96 %. HR response 78 /min at rest to 105/min at peak exertion. [name]  Did nto Desaturate < 88% . Jayln Mccleery yes  Desaturated </= 3% points.  [name] yes did get tachyardic   OV 01/01/2018  Chief Complaint  Patient presents with   Follow-up    PFT done today.  Pt states she has been doing good since last visit. Had a cold mid January 2019 but has been better since. Denies any complaints of cough, SOB, or CP.   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 She continues on Brio. Since last visit January 2019 she continues to feel better. COPD cat score is very minimal as documented below. She had pulmonary function test today and this shows stability in her FVC after initial improvement. Her DLCO is even better. She is on observation course regarding her ILD given the improvement in her pulmonary function test. Her last CT scan of the chest was July 2018        OV 07/09/2018  Subjective:  Patient ID: Erin Li, female , DOB: December 05, 1940 , age 81 y.o. , MRN: [id number] , ADDRESS: [place] [address]   07/09/2018 -   Chief Complaint  Patient presents with   Consult    Alot of coughing producing mucus no color at this time.     HPI Corin Fedorchak 81 y.o. 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  She tells me that for the last 3 weeks has had increased cough with congestion and white sputum more than baseline but activities of daily living and shortness of breath or guarding is all unchanged and she has good effort tolerance.  There are no new other issues.  In terms of ILD: Last visit I thought I will be in improved based on improvement in pulmonary function test but actually that might of been a red herring because today's pulmonary function test shows a decline which is in line with previous pulmonary function test showing stability.  She certainly feels stable.  In  July 2019 she had a low-dose CT scan for lung cancer and this ruled out lung cancer on screening.  They were able to report some mildly changes at the base.  She is not interested in surgical lung biopsy because her son who had pulmonary fibrosis finally died from 47 years of transplant from pulmonary embolism, stroke and acute renal failure and chronic critical illness in the rehab facility.      OV 10/08/2019  Subjective:  Patient ID: Erin Li, female , DOB: 07/05/1941 , age 20  y.o. , MRN: [id number] , ADDRESS: [place] [address]   10/08/2019 -   Chief Complaint  Patient presents with   Follow-up    Pt states she has been doing well since last visit and states her breathing has been doing well. Pt will have an occ cough.   predominantly COPD with some amount of basal pulmonary fibrosis  N(atlernate to UIP)  in the setting of her son having pulmonary fibrosis alt- decieasedd 2019    HPI Nhung Riebe 81 y.o. 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  Last seen in #2019.  She presents with her husband.  Since the onset of the COVID-19 pandemic there have been in the house and socially isolated.  She has been doing well without any problems overall stable.  She not exercising as much as she used to.  She not taking any inhalers for her COPD.  She only takes Mucinex.  Therefore her cough symptom with mucus is slightly worse than before.  She had high-resolution CT scan of the chest which I reviewed the report and agree with the findings and I interpreted as well with visualization: She has emphysema associated with alternate diagnosis pattern.  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  She still grieving from her son who died in 06/04/18 from pulmonary fibrosis.    OV 06/01/2020   Subjective:  Patient ID: Erin Li, female , DOB: 03-04-1941, age 74 y.o. years. , MRN: [id number],  ADDRESS: [place]  [address] PCP  [name], MD Providers : Treatment Team:  Attending Provider: Brand Males, MD   Chief Complaint  Patient presents with   Follow-up    ILD, no concerns    predominantly COPD with some amount of basal pulmonary fibrosis  N(atlernate to UIP)  in the setting of her son having pulmonary fibrosis alt- decieasedd 2019   HPI Bryttney Tschida 81 y.o. -presents for follow-up.  Last seen February 2021.  Since then she is doing stable.  She continues to be stable.  Her symptom scores are very minimal and shows stability.  She had pulmonary function test August 2021 and compared to November 2019 she is stable.  Her walking desaturation test is also stable.  Her last CT scan was January 2021.  She prefers to have monitoring for pulmonary fibrosis because of her son's issue.  She likes the strategy of alternating pulmonary function test and also CT scan of the chest.      OV 12/06/2021  Subjective:  Patient ID: Erin Li, female , DOB: [birth year] , age 49 y.o. , MRN: [id number] , ADDRESS: [place] [address] PCP [name], MD Patient Care Team: [name], MD as PCP - General (Internal Medicine)  This Provider for this visit: Treatment Team:  Attending Provider: Brand Males, MD   Follow-up emphysema with some amount of ILD.  Last seen in 06-04-2020.  It is now 18 months since I last saw her.  Doing well overall.  Last month she did have a flareup requiring antibiotics and prednisone for COPD exacerbation.  She continues on [place].  No new health issues.  April 23, 2022 she plans to go to [place].  She is wanting antibiotic and prednisone.  Her last pulmonary function test was a year and a half ago.  Her last CT scan of the chest was a year ago or more.  She is agreeable to have follow-up reassessment done.  Symptom score is as below and stable.  Walking desaturation test shows a tendency to desaturate but still acceptable.    OV  05/18/2022   Subjective:  Patient ID: Erin Li, female , DOB: [birth year], age 81 y.o. years. , MRN: [id number],  ADDRESS: [place] [address] PCP  Janie Morning, DO Providers : Treatment Team:  Attending Provider: Brand Males, MD Patient Care Team: Janie Morning, DO as PCP - General (Family Medicine)    Chief Complaint  Patient presents with   Follow-up    PFT performed today.  Pt states she has been doing okay since last visit. States for the past 2 weeks she has been coughing a lot getting up white phlegm and states cough is worse at night.    Follow-up emphysema with some amount of ILD   HPI Annete S Ines 81 y.o. -[son also has ILD].  Returns for follow-up.  In August 2023 she went to [place] with her female side of the family.  She return May 02, 2022.  She had a great time.  No issues walking around the.  Then after coming back for the last 2 weeks she has respiratory symptoms particularly increased cough for the last 2 to 3 weeks associated with white sputum occasionally yellow.  Definitely worse than baseline.  Moderate in severity and but no fever.  No chills no worsening shortness of breath.  No wheezing.  No edema no orthopnea no chills.  However pulmonary function test today shows a decline [see below].  Despite this increase in symptoms her subjective symptom score below is stable.  Of note her husband is here with her today.       SYMPTOM SCALE -  06/01/2020  12/06/2021  05/18/2022   O2 use ra ra0 ra  Shortness of Breath 0 -> 5 scale with 5 being worst (score 6 If unable to do) 0   At rest 0 0 0  Simple tasks - showers, clothes change, eating, shaving 0 00 0  Household (dishes, doing bed, laundry) 0  0  Shopping 0 0 0  Walking level at own pace 0 0 0  Walking up Stairs '2 2 2  '$ Total (30-36) Dyspnea Score '2 2 2  '$ How bad is your cough? '2 3 3  '$ How bad is your fatigue 0 0 0  How bad is nausea 00 0 0  How bad is vomiting?  0 0  0  How bad is diarrhea? 0 0 0  How bad is anxiety? 0 0 0  How bad is depression 0 0 0     Simple office walk 185 feet x  3 laps goal with forehead probe 12/06/2021    O2 used ra   Number laps completed 3   Comments about pace avg   Resting Pulse Ox/HR 97% and 91/min   Final Pulse Ox/HR 93% and 112/min   Desaturated </= 88% no   Desaturated <= 3% points yes   Got Tachycardic >/= 90/min yes   Symptoms at end of test No complaints   Miscellaneous comments x     PFT   Narrative & Impression  CLINICAL DATA:  Interstitial lung disease   EXAM: CT CHEST WITHOUT CONTRAST   TECHNIQUE: Multidetector CT imaging of the chest was performed following the standard protocol without intravenous contrast. High resolution imaging of the lungs, as well as inspiratory and expiratory imaging, was performed.   RADIATION DOSE REDUCTION: This exam was performed according to the  departmental dose-optimization program which includes automated exposure control, adjustment of the mA and/or kV according to patient size and/or use of iterative reconstruction technique.   COMPARISON:  10/01/2020, 09/23/2019, 09/27/2018   FINDINGS: Cardiovascular: Aortic atherosclerosis. Normal heart size. Left and right coronary artery calcifications. No pericardial effusion.   Mediastinum/Nodes: No enlarged mediastinal, hilar, or axillary lymph nodes. Thyroid gland, trachea, and esophagus demonstrate no significant findings.   Lungs/Pleura: Moderate centrilobular and paraseptal emphysema. Unchanged mild pulmonary fibrosis in a pattern with apical to basal gradient, featuring irregular peripheral interstitial opacity, septal thickening, and ground-glass, mild, tubular bronchiectasis, with small areas of subpleural bronchiolectasis at the lung bases. No significant air trapping on expiratory phase imaging. Multiple small bilateral pulmonary nodules are stable and benign, largest a 0.9 cm nodule of the superior  segment right lower lobe (series 4, image 57). No pleural effusion or pneumothorax.   Upper Abdomen: No acute abnormality.   Musculoskeletal: No chest wall abnormality. No suspicious osseous lesions identified.   IMPRESSION: 1. Unchanged mild pulmonary fibrosis in a pattern with apical to basal gradient, featuring irregular peripheral interstitial opacity, septal thickening, and ground-glass, with small areas of subpleural bronchiolectasis at the lung bases. Fibrotic findings are not significantly changed compared to immediate prior examination, although as previously reported are worsened over a longer period of time dating back to at least 2015. Findings are categorized as probable UIP per consensus guidelines: Diagnosis of Idiopathic Pulmonary Fibrosis: An Official ATS/ERS/JRS/ALAT Clinical Practice Guideline. [place], Iss 5, [phone], May 05 2017. 2. Multiple small bilateral pulmonary nodules are stable and definitively benign. 3. Emphysema. 4. Coronary artery disease.   Aortic Atherosclerosis (ICD10-I70.0) and Emphysema (ICD10-J43.9).     Electronically Signed   By: Delanna Ahmadi M.D.   On: 02/25/2022 17:51    No results found.    PFT     Latest Ref Rng & Units 05/18/2022    3:08 PM 04/06/2020   10:48 AM 07/09/2018   12:49 PM 01/01/2018    8:51 AM 09/28/2017    9:38 AM 04/03/2017    9:51 AM 12/07/2015    8:42 AM  PFT Results  FVC-Pre L 2.17  P 2.38  2.33  2.38  2.38  2.20  2.38   FVC-Predicted Pre % 78  P 83  79  81  81  77  82   FVC-Post L     2.37   2.33   FVC-Predicted Post %     80   80   Pre FEV1/FVC % % 65  P 65  59  68  63  60  65   Post FEV1/FCV % %     70   67   FEV1-Pre L 1.41  P 1.55  1.38  1.63  1.51  1.32  1.54   FEV1-Predicted Pre % 67  P 72  62  74  68  62  71   FEV1-Post L     1.65   1.55   DLCO uncorrected ml/min/mmHg 10.58  P 13.11  12.18  20.54  12.59  11.50    DLCO UNC% % 54  P 66  47  80  49  47    DLCO corrected  ml/min/mmHg 10.58  P 13.11    12.26  11.14    DLCO COR %Predicted % 54  P 66    47  45    DLVA Predicted % 63  P 73  57  59  58  54    TLC L     5.35     TLC % Predicted %     102     RV % Predicted %     120       P Preliminary result       has a past medical history of Coronary artery disease, Diverticulosis, HTN (hypertension), and Lung nodule.   reports that she quit smoking about 10 years ago. Her smoking use included cigarettes. She has a 37.50 pack-year smoking history. She has never used smokeless tobacco.  Past Surgical History:  Procedure Laterality Date   CARDIAC CATHETERIZATION     CORONARY ANGIOPLASTY     INTRAMEDULLARY (IM) NAIL INTERTROCHANTERIC Right 02/26/2022   Procedure: INTRAMEDULLARY (IM) NAIL INTERTROCHANTRIC;  Surgeon: Hiram Gash, MD;  Location: [facility];  Service: Orthopedics;  Laterality: Right;   TONSILLECTOMY AND ADENOIDECTOMY      No Known Allergies  Immunization History  Administered Date(s) Administered   Influenza Split 06/04/2013, 05/05/2014   Influenza Whole 05/28/2012   Influenza, High Dose Seasonal PF 06/05/2016, 06/04/2017, 05/20/2018, 05/06/2019   Influenza, Quadrivalent, Recombinant, Inj, Pf 05/25/2021   Influenza,inj,Quad PF,6+ Mos 06/05/2015   Moderna Sars-Covid-2 Vaccination 11/10/2019, 12/08/2019, 06/30/2020, 01/11/2021, 08/02/2021   PNEUMOCOCCAL CONJUGATE-20 11/17/2020   Pneumococcal Conjugate-13 01/26/2015   Pneumococcal Polysaccharide-23 09/04/2005, 09/24/2012   Tdap 03/20/2012    Family History  Problem Relation Age of Onset   Heart attack Father    Kidney disease Father    COPD Mother    Lung cancer Brother 54   Pulmonary fibrosis Son        had lung transplant     Current Outpatient Medications:    albuterol (VENTOLIN HFA) 108 (90 Base) MCG/ACT inhaler, Inhale 2 puffs into the lungs every 6 (six) hours as needed for wheezing or shortness of breath., Disp: 8 g, Rfl: 2   Ascorbic Acid (VITAMIN C) 1000 MG tablet, Take  1,000 mg by mouth daily., Disp: , Rfl:    atorvastatin (LIPITOR) 10 MG tablet, TAKE 1 TABLET BY MOUTH  EVERY EVENING AFTER DINNER (Patient taking differently: Take 10 mg by mouth at bedtime.), Disp: 30 tablet, Rfl: 1   b complex vitamins tablet, Take 2 tablets by mouth daily., Disp: , Rfl:    calcium-vitamin D 250-100 MG-UNIT per tablet, Take 1 tablet by mouth daily. , Disp: , Rfl:    diltiazem (CARDIZEM) 60 MG tablet, Take 1 tablet (60 mg total) by mouth 3 (three) times daily., Disp: , Rfl:    diphenhydrAMINE (BENADRYL) 25 MG tablet, Take 50 mg by mouth at bedtime., Disp: , Rfl:    docusate sodium (COLACE) 100 MG capsule, Take 1 capsule (100 mg total) by mouth 2 (two) times daily., Disp: 10 capsule, Rfl: 0   fish oil-omega-3 fatty acids 1000 MG capsule, Take 1,000 mg by mouth daily., Disp: , Rfl:    fluticasone furoate-vilanterol (BREO ELLIPTA) 100-25 MCG/ACT AEPB, INHALE ONE PUFF INTO THE LUNGS DAILY, Disp: 60 each, Rfl: 5   glucosamine-chondroitin 500-400 MG tablet, Take 1 tablet by mouth every morning., Disp: , Rfl:    LUTEIN PO, Take 1 tablet by mouth daily., Disp: , Rfl:    magnesium 30 MG tablet, Take 30 mg by mouth daily in the afternoon., Disp: , Rfl:    Melatonin 10 MG TABS, Take 10 mg by mouth at bedtime., Disp: , Rfl:    OVER THE COUNTER MEDICATION, Take 2 mg by mouth daily in the afternoon.  Biotin 2 mg, Disp: , Rfl:    OVER THE COUNTER MEDICATION, Place 1 drop into both eyes daily as needed (dry eye). OTC eye drop, Disp: , Rfl:    PAPAYA PO, Take 3 tablets by mouth daily as needed (heartburn)., Disp: , Rfl:    selenium 50 MCG TABS, Take 50 mcg by mouth daily., Disp: , Rfl:    senna (SENOKOT) 8.6 MG tablet, Take 2 tablets by mouth daily., Disp: , Rfl:    vitamin B-12 (CYANOCOBALAMIN) 1000 MCG tablet, Take 1,000 mcg by mouth daily., Disp: , Rfl:    vitamin E 200 UNIT capsule, Take 200 Units by mouth daily., Disp: , Rfl:       Objective:   Vitals:   05/18/22 1559  BP: 114/78   Pulse: 78  Temp: 98.2 F (36.8 C)  TempSrc: Oral  SpO2: 96%  Weight: 140 lb 9.6 oz (63.8 kg)  Height: '5\' 5"'$  (1.651 m)    Estimated body mass index is 23.4 kg/m as calculated from the following:   Height as of this encounter: '5\' 5"'$  (1.651 m).   Weight as of this encounter: 140 lb 9.6 oz (63.8 kg).  '@WEIGHTCHANGE'$ @  [employer]   05/18/22 1559  Weight: 140 lb 9.6 oz (63.8 kg)     Physical Exam   General: No distress. Looks well Neuro: Alert and Oriented x 3. GCS 15. Speech normal Psych: Pleasant Resp:  Barrel Chest - no.  Wheeze - no, Crackles -usual basal crackles, No overt respiratory distress CVS: Normal heart sounds. Murmurs - no Ext: Stigmata of Connective Tissue Disease - no HEENT: Normal upper airway. PEERL +. No post nasal drip        Assessment:       ICD-10-CM   1. Chronic obstructive pulmonary disease, unspecified COPD type ([place])  J44.9     2. COPD with acute exacerbation ([place])  J44.1     3. ILD (interstitial lung disease) ([place])  J84.9     4. Vaccine counseling  Z71.85          Plan:     Patient Instructions     ICD-10-CM   1. Chronic obstructive pulmonary disease, unspecified COPD type ([place])  J44.9     2. COPD with acute exacerbation ([place])  J44.1     3. ILD (interstitial lung disease) ([place])  J84.9     4. Vaccine counseling  Z71.85      After return from [place] you seem to be having a COPD exacerbation.  This correlates with breathing test also showing a decline  Plan - Do COVID antigen testing today and tomorrow -> if positive call for antiviral -Take doxycycline '100mg'$  po twice daily x 5 days; take after meals and avoid sunlight -Take prednisone 40 mg daily x 2 days, then prednisone '30mg'$  daily x 2 days, then '20mg'$  daily x 2 days, then '10mg'$  daily x 2 days, then '5mg'$  daily x 2 days and stop -Do repeat spirometry and DLCO in 8-10 weeks; can be done in [place] -Defer respiratory viral vaccines till completing antibiotics and  prednisone  -Once completed to flu shot, RSV vaccine and also COVID mRNA booster  Follow-up -retrun 8-10 weeks video visit to discuss PFT results and if we need to do a Ct chest or not    SIGNATURE    Dr. Brand Males, M.D., F.C.C.P,  Pulmonary and Critical Care Medicine Staff Physician, [place] Director - Interstitial Lung Disease  Program  [facility]  [place] [facility] [place], [place], [address]  Pager: [phone], If no answer or between  15:00h - 7:00h: call [phone] Telephone: [phone]  4:35 PM 05/18/2022

## 2022-05-18 NOTE — Progress Notes (Signed)
Spirometry and Dlco done today. 

## 2022-05-18 NOTE — Patient Instructions (Addendum)
ICD-10-CM   1. Chronic obstructive pulmonary disease, unspecified COPD type ([place])  J44.9     2. COPD with acute exacerbation ([place])  J44.1     3. ILD (interstitial lung disease) ([place])  J84.9     4. Vaccine counseling  Z71.85      After return from [place] you seem to be having a COPD exacerbation.  This correlates with breathing test also showing a decline  Plan - Do COVID antigen testing today and tomorrow -> if positive call for antiviral -Take doxycycline '100mg'$  po twice daily x 5 days; take after meals and avoid sunlight -Take prednisone 40 mg daily x 2 days, then prednisone '30mg'$  daily x 2 days, then '20mg'$  daily x 2 days, then '10mg'$  daily x 2 days, then '5mg'$  daily x 2 days and stop -Do repeat spirometry and DLCO in 8-10 weeks; can be done in [place] -Defer respiratory viral vaccines till completing antibiotics and prednisone  -Once completed to flu shot, RSV vaccine and also COVID mRNA booster  Follow-up -retrun 8-10 weeks video visit to discuss PFT results and if we need to do a Ct chest or not

## 2022-05-18 NOTE — Addendum Note (Signed)
Addended by: Lorretta Harp on: 05/18/2022 04:38 PM   Modules accepted: Orders

## 2022-06-02 ENCOUNTER — Encounter: Payer: Self-pay | Admitting: Internal Medicine

## 2022-06-05 MED ORDER — PREDNISONE 10 MG PO TABS: ORAL_TABLET | ORAL | 0 refills | Status: AC

## 2022-06-05 MED ORDER — AZITHROMYCIN 250 MG PO TABS: ORAL_TABLET | ORAL | 0 refills | Status: DC

## 2022-06-05 NOTE — Telephone Encounter (Signed)
Sorry not sure how I missed this. But yes ok tostart prednisone agaion and do for 8 days  Take prednisone 40 mg daily x 2 days, then '20mg'$  daily x 2 days, then '10mg'$  daily x 2 days, then '5mg'$  daily x 2 days and stop  RE abx  Do Z Pak ths time   No Known Allergies

## 2022-06-06 DIAGNOSIS — L03116 Cellulitis of left lower limb: Secondary | ICD-10-CM | POA: Diagnosis not present

## 2022-06-07 DIAGNOSIS — E559 Vitamin D deficiency, unspecified: Secondary | ICD-10-CM | POA: Diagnosis not present

## 2022-06-07 DIAGNOSIS — E7800 Pure hypercholesterolemia, unspecified: Secondary | ICD-10-CM | POA: Diagnosis not present

## 2022-06-14 DIAGNOSIS — J449 Chronic obstructive pulmonary disease, unspecified: Secondary | ICD-10-CM | POA: Diagnosis not present

## 2022-06-14 DIAGNOSIS — M810 Age-related osteoporosis without current pathological fracture: Secondary | ICD-10-CM | POA: Diagnosis not present

## 2022-06-14 DIAGNOSIS — I10 Essential (primary) hypertension: Secondary | ICD-10-CM | POA: Diagnosis not present

## 2022-06-14 DIAGNOSIS — E559 Vitamin D deficiency, unspecified: Secondary | ICD-10-CM | POA: Diagnosis not present

## 2022-06-14 DIAGNOSIS — T148XXA Other injury of unspecified body region, initial encounter: Secondary | ICD-10-CM | POA: Diagnosis not present

## 2022-06-14 DIAGNOSIS — E7800 Pure hypercholesterolemia, unspecified: Secondary | ICD-10-CM | POA: Diagnosis not present

## 2022-06-14 DIAGNOSIS — I2510 Atherosclerotic heart disease of native coronary artery without angina pectoris: Secondary | ICD-10-CM | POA: Diagnosis not present

## 2022-06-15 ENCOUNTER — Encounter: Payer: Self-pay | Admitting: Internal Medicine

## 2022-07-04 DIAGNOSIS — M5431 Sciatica, right side: Secondary | ICD-10-CM | POA: Diagnosis not present

## 2022-07-04 DIAGNOSIS — T148XXA Other injury of unspecified body region, initial encounter: Secondary | ICD-10-CM | POA: Diagnosis not present

## 2022-07-13 DIAGNOSIS — M9905 Segmental and somatic dysfunction of pelvic region: Secondary | ICD-10-CM | POA: Diagnosis not present

## 2022-07-13 DIAGNOSIS — M5441 Lumbago with sciatica, right side: Secondary | ICD-10-CM | POA: Diagnosis not present

## 2022-07-13 DIAGNOSIS — M9903 Segmental and somatic dysfunction of lumbar region: Secondary | ICD-10-CM | POA: Diagnosis not present

## 2022-07-18 DIAGNOSIS — M9903 Segmental and somatic dysfunction of lumbar region: Secondary | ICD-10-CM | POA: Diagnosis not present

## 2022-07-18 DIAGNOSIS — M9905 Segmental and somatic dysfunction of pelvic region: Secondary | ICD-10-CM | POA: Diagnosis not present

## 2022-07-18 DIAGNOSIS — M5441 Lumbago with sciatica, right side: Secondary | ICD-10-CM | POA: Diagnosis not present

## 2022-07-21 DIAGNOSIS — M9905 Segmental and somatic dysfunction of pelvic region: Secondary | ICD-10-CM | POA: Diagnosis not present

## 2022-07-21 DIAGNOSIS — M5441 Lumbago with sciatica, right side: Secondary | ICD-10-CM | POA: Diagnosis not present

## 2022-07-21 DIAGNOSIS — M9903 Segmental and somatic dysfunction of lumbar region: Secondary | ICD-10-CM | POA: Diagnosis not present

## 2022-08-01 ENCOUNTER — Ambulatory Visit (INDEPENDENT_AMBULATORY_CARE_PROVIDER_SITE_OTHER): Payer: Medicare Other | Admitting: Internal Medicine

## 2022-08-01 ENCOUNTER — Ambulatory Visit: Payer: Medicare Other | Admitting: Internal Medicine

## 2022-08-01 ENCOUNTER — Encounter: Payer: Self-pay | Admitting: Internal Medicine

## 2022-08-01 VITALS — BP 114/68 | HR 87 | Temp 97.6°F | Ht 65.0 in | Wt 143.0 lb

## 2022-08-01 DIAGNOSIS — R053 Chronic cough: Secondary | ICD-10-CM | POA: Diagnosis not present

## 2022-08-01 DIAGNOSIS — J449 Chronic obstructive pulmonary disease, unspecified: Secondary | ICD-10-CM

## 2022-08-01 DIAGNOSIS — Z7729 Contact with and (suspected ) exposure to other hazardous substances: Secondary | ICD-10-CM

## 2022-08-01 DIAGNOSIS — Z836 Family history of other diseases of the respiratory system: Secondary | ICD-10-CM

## 2022-08-01 DIAGNOSIS — Z7689 Persons encountering health services in other specified circumstances: Secondary | ICD-10-CM | POA: Diagnosis not present

## 2022-08-01 DIAGNOSIS — J8410 Pulmonary fibrosis, unspecified: Secondary | ICD-10-CM

## 2022-08-01 DIAGNOSIS — J849 Interstitial pulmonary disease, unspecified: Secondary | ICD-10-CM | POA: Diagnosis not present

## 2022-08-01 DIAGNOSIS — Z7185 Encounter for immunization safety counseling: Secondary | ICD-10-CM | POA: Diagnosis not present

## 2022-08-01 DIAGNOSIS — J439 Emphysema, unspecified: Secondary | ICD-10-CM

## 2022-08-01 LAB — PULMONARY FUNCTION TEST
DL/VA % pred: 64 %
DL/VA: 2.61 ml/min/mmHg/L
DLCO cor % pred: 55 %
DLCO cor: 10.86 ml/min/mmHg
DLCO unc % pred: 55 %
DLCO unc: 10.86 ml/min/mmHg
FEF 25-75 Pre: 0.49 L/sec
FEF2575-%Pred-Pre: 32 %
FEV1-%Pred-Pre: 67 %
FEV1-Pre: 1.4 L
FEV1FVC-%Pred-Pre: 77 %
FEV6-%Pred-Pre: 88 %
FEV6-Pre: 2.33 L
FEV6FVC-%Pred-Pre: 101 %
FVC-%Pred-Pre: 87 %
FVC-Pre: 2.44 L
Pre FEV1/FVC ratio: 57 %
Pre FEV6/FVC Ratio: 96 %

## 2022-08-01 NOTE — Progress Notes (Signed)
OV 04/10/2017   Chief Complaint  Patient presents with   Follow-up    Pt states her breathing is unchanged since last OV. Pt states she has mild non prod cough. Pt denies CP/tightness, f/c/s, and chest congestion.    Follow-up COPD but now has concerned that she has developed interstitial lung disease  She is here with her husband. Back in 2015 a high-resolution CT chest because I thought I heard crackles and thoracic radiology ruled out interstitial lung disease. In the interim she's had lung cancer screening CT chest at overall stability in her pulmonary function test. But there was concern because of crackles that she might have interstitial lung disease so we did a high-resolution CT chest and July 2018 and thoracic radiologist reported as definite precedence of ILD although it is not a UIP pattern. [name] She does say that her son and over the lung transplant in [place] 8 years ago at age of 21 because of IPF. She did have autoimmune panel that is only trace positive for ENP . 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 She denies any humidifier or mildew or mold in the house. The CT chest also shows coronary artery calcification. Of note I personally visualized the CT chest.  ACCP ILD questiin  - symptioms: occ cough x 2 years with some night cough and mild phleogn ona nd off. Very mild non troubling dyspnea x 3 years - pasth medical : GERD +, mild dysphagia + - personal exposure x -: +ver for remote use of rec / street drugs. Cig smoked age 67 though 78 at 56 cigs/dat - fam hx: COPD +, son with transplant for ?  IPF at age 78 - home exposurE: does have hot tube - uses it once a month. Has a Macaw bird x > 20 years - residence: [place], [place], [place], [place], [place] - occupatin: bookeeper - pulmotiox hx: negagive  Results for Erin, Li (MRN [id number]) as of  04/10/2017 11:21  Ref. Range 04/03/2017 10:18  CK Total Latest Ref Range: 7 - 177 U/L 63  CK-MB Latest Ref Range: 0.3 - 4.0 ng/mL 2.4  Aldolase Latest Ref Range: <=8.1 U/L 3.5  Sed Rate Latest Ref Range: 0 - 30 mm/hr 20  Anit Nuclear Antibody(ANA) Latest Ref Range: NEGATIVE  NEG  Angiotensin-Converting Enzyme Latest Ref Range: 9 - 67 U/L 67  Cyclic Citrullin Peptide Ab Latest Units: Units <16  ds DNA Ab Latest Units: IU/mL 1  ENA RNP Ab Latest Ref Range: 0.0 - 0.9 AI 1.4 (H)  RA Latex Turbid. Latest Ref Range: <14 IU/mL <14  SSA (Ro) (ENA) Antibody, IgG Latest Ref Range: <1.0 NEG AI  <1.0 NEG  SSB (La) (ENA) Antibody, IgG Latest Ref Range: <1.0 NEG AI  <1.0 NEG  Scleroderma (Scl-70) (ENA) Antibody, IgG Latest Ref Range: <1.0 NEG AI  <1.0 NEG    OV 09/28/2017  Chief Complaint  Patient presents with   Follow-up    PFT done today. States she has been doing good and has no complaints of cough, SOB, or CP.    Fu mixed empyhsema and pulmonary fibrosis  For  years of only known who is a COPD patient with a family history of pulmonary fibrosis.  But it appears that in 2018 she started developing signs of pulmonary fibrosis with crackles and  findings of indeterminate UIP on CT chest July 2018.  She has family history of pulmonary fibrosis and that her son had lung transplant for the same.  She now presents for follow-up.  In the interim she started feeling better.  She is less dyspneic.  She continues with the Brio.  There are no new issues.  Pulmonary function test done as documented below shows an improvement.  Therefore instead of biopsy she feels keeping an eye on this is a better approach.    Walking desaturation test on 09/28/2017 185 feet x 3 laps on ROOM AIR:  did NOT desaturate. Rest pulse ox was 99%, final pulse ox was96 %. HR response 78 /min at rest to 105/min at peak exertion. Patient Erin Li  Did nto Desaturate < 88% . Aaleah Bueche yes  Desaturated </= 3% points. Adaleena  Speece yes did get tachyardic   OV 01/01/2018  Chief Complaint  Patient presents with   Follow-up    PFT done today.  Pt states she has been doing good since last visit. Had a cold mid January 2019 but has been better since. Denies any complaints of cough, SOB, or CP.   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 She continues on Brio. Since last visit January 2019 she continues to feel better. COPD cat score is very minimal as documented below. She had pulmonary function test today and this shows stability in her FVC after initial improvement. Her DLCO is even better. She is on observation course regarding her ILD given the improvement in her pulmonary function test. Her last CT scan of the chest was July 2018        OV 07/09/2018  Subjective:  Patient ID: [name], female , DOB: Aug 27, 1941 , age 81 y.o. , MRN: [id number] , ADDRESS: [place] [address]   07/09/2018 -   Chief Complaint  Patient presents with   Consult    Alot of coughing producing mucus no color at this time.     HPI Erin Li 81 y.o. 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  She tells me that for the last 3 weeks has had increased cough with congestion and white sputum more than baseline but activities of daily living and shortness of breath or guarding is all unchanged and she has good effort tolerance.  There are no new other issues.  In terms of ILD: Last visit I thought I will be in improved based on improvement in pulmonary function test but actually that might of been a red herring because today's pulmonary function test shows a decline which is in line with previous pulmonary function test showing stability.  She certainly feels stable.  In July 2019  she had a low-dose CT scan for lung cancer and this ruled out lung cancer on screening.  They were able to report some mildly changes at the base.  She is not interested in surgical lung biopsy because her son who had pulmonary fibrosis finally died from 78 years of transplant from pulmonary embolism, stroke and acute renal failure and chronic critical illness in the rehab facility.      OV 10/08/2019  Subjective:  Patient ID: [name], female , DOB: [birth year] , age 25 y.o. ,  MRN: [id number] , ADDRESS: [place] [address]   10/08/2019 -   Chief Complaint  Patient presents with   Follow-up    Pt states she has been doing well since last visit and states her breathing has been doing well. Pt will have an occ cough.   predominantly COPD with some amount of basal pulmonary fibrosis  N(atlernate to UIP)  in the setting of her son having pulmonary fibrosis alt- decieasedd 2019    HPI Alaila Floyd 81 y.o. 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  Last seen in #2019.  She presents with her husband.  Since the onset of the COVID-19 pandemic there have been in the house and socially isolated.  She has been doing well without any problems overall stable.  She not exercising as much as she used to.  She not taking any inhalers for her COPD.  She only takes Mucinex.  Therefore her cough symptom with mucus is slightly worse than before.  She had high-resolution CT scan of the chest which I reviewed the report and agree with the findings and I interpreted as well with visualization: She has emphysema associated with alternate diagnosis pattern.  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  She still grieving from her son who died in June 07, 2018 from pulmonary fibrosis.    OV 06/01/2020   Subjective:  Patient ID: [name], female , DOB: May 05, 1941, age 67 y.o. years. , MRN: [id number],  ADDRESS: [place] [address] PCP   [name], MD Providers : Treatment Team:  Attending Provider: Brand Males, MD   Chief Complaint  Patient presents with   Follow-up    ILD, no concerns    predominantly COPD with some amount of basal pulmonary fibrosis  N(atlernate to UIP)  in the setting of her son having pulmonary fibrosis alt- decieasedd 2019   HPI Lorelei Mateus 81 y.o. -presents for follow-up.  Last seen February 2021.  Since then she is doing stable.  She continues to be stable.  Her symptom scores are very minimal and shows stability.  She had pulmonary function test August 2021 and compared to November 2019 she is stable.  Her walking desaturation test is also stable.  Her last CT scan was January 2021.  She prefers to have monitoring for pulmonary fibrosis because of her son's issue.  She likes the strategy of alternating pulmonary function test and also CT scan of the chest.      OV 12/06/2021  Subjective:  Patient ID: [name], female , DOB: Feb 03, 1941 , age 55 y.o. , MRN: [id number] , ADDRESS: [place] [address] PCP [name], MD Patient Care Team: [name], MD as PCP - General (Internal Medicine)  This Provider for this visit: Treatment Team:  Attending Provider: Brand Males, MD   Follow-up emphysema with some amount of ILD.  Last seen in 2020/06/07.  It is now 18 months since I last saw her.  Doing well overall.  Last month she did have a flareup requiring antibiotics and prednisone for COPD exacerbation.  She continues on [place].  No new health issues.  April 23, 2022 she plans to go to [place].  She is wanting antibiotic and prednisone.  Her last pulmonary function test was a year and a half ago.  Her last CT scan of the chest was a year ago or more.  She is agreeable to have follow-up reassessment done.  Symptom score  is as below and stable.  Walking desaturation test shows a tendency to desaturate but still acceptable.    OV  05/18/2022   Subjective:  Patient ID: Erin Li, female , DOB: [birth year], age 81 y.o. years. , MRN: [id number],  ADDRESS: [place] [address] PCP  Janie Morning, DO Providers : Treatment Team:  Attending Provider: Brand Males, MD Patient Care Team: Janie Morning, DO as PCP - General (Family Medicine)    Chief Complaint  Patient presents with   Follow-up    PFT performed today.  Pt states she has been doing okay since last visit. States for the past 2 weeks she has been coughing a lot getting up white phlegm and states cough is worse at night.    Follow-up emphysema with some amount of ILD   HPI Kieley S Przybysz 8 y.o. -[son also has ILD].  Returns for follow-up.  In August 2023 she went to [place] with her female side of the family.  She return May 02, 2022.  She had a great time.  No issues walking around the.  Then after coming back for the last 2 weeks she has respiratory symptoms particularly increased cough for the last 2 to 3 weeks associated with white sputum occasionally yellow.  Definitely worse than baseline.  Moderate in severity and but no fever.  No chills no worsening shortness of breath.  No wheezing.  No edema no orthopnea no chills.  However pulmonary function test today shows a decline [see below].  Despite this increase in symptoms her subjective symptom score below is stable.  Of note her husband is here with her today.    PFT   Narrative & Impression  CLINICAL DATA:  Interstitial lung disease   EXAM: CT CHEST WITHOUT CONTRAST   TECHNIQUE: Multidetector CT imaging of the chest was performed following the standard protocol without intravenous contrast. High resolution imaging of the lungs, as well as inspiratory and expiratory imaging, was performed.   RADIATION DOSE REDUCTION: This exam was performed according to the departmental dose-optimization program which includes automated exposure control, adjustment of the  mA and/or kV according to patient size and/or use of iterative reconstruction technique.   COMPARISON:  10/01/2020, 09/23/2019, 09/27/2018   FINDINGS: Cardiovascular: Aortic atherosclerosis. Normal heart size. Left and right coronary artery calcifications. No pericardial effusion.   Mediastinum/Nodes: No enlarged mediastinal, hilar, or axillary lymph nodes. Thyroid gland, trachea, and esophagus demonstrate no significant findings.   Lungs/Pleura: Moderate centrilobular and paraseptal emphysema. Unchanged mild pulmonary fibrosis in a pattern with apical to basal gradient, featuring irregular peripheral interstitial opacity, septal thickening, and ground-glass, mild, tubular bronchiectasis, with small areas of subpleural bronchiolectasis at the lung bases. No significant air trapping on expiratory phase imaging. Multiple small bilateral pulmonary nodules are stable and benign, largest a 0.9 cm nodule of the superior segment right lower lobe (series 4, image 57). No pleural effusion or pneumothorax.   Upper Abdomen: No acute abnormality.   Musculoskeletal: No chest wall abnormality. No suspicious osseous lesions identified.   IMPRESSION: 1. Unchanged mild pulmonary fibrosis in a pattern with apical to basal gradient, featuring irregular peripheral interstitial opacity, septal thickening, and ground-glass, with small areas of subpleural bronchiolectasis at the lung bases. Fibrotic findings are not significantly changed compared to immediate prior examination, although as previously reported are worsened over a longer period of time dating back to at least 2015. Findings are categorized as probable UIP per consensus guidelines: Diagnosis of Idiopathic Pulmonary Fibrosis:  An Official ATS/ERS/JRS/ALAT Clinical Practice Guideline. [place], Iss 5, [phone], May 05 2017. 2. Multiple small bilateral pulmonary nodules are stable and definitively benign. 3.  Emphysema. 4. Coronary artery disease.   Aortic Atherosclerosis (ICD10-I70.0) and Emphysema (ICD10-J43.9).     Electronically Signed   By: Delanna Ahmadi M.D.   On: 02/25/2022 17:51    No results found.     OV 08/01/2022  Subjective:  Patient ID: Erin Li, female , DOB: [birth year] , age 34 y.o. , MRN: [id number] , ADDRESS: [place] [address] PCP Janie Morning, DO Patient Care Team: Janie Morning, DO as PCP - General (Family Medicine)  This Provider for this visit: Treatment Team:  Attending Provider: Brand Males, MD  Follow-up emphysema with some amount of ILD  08/01/2022 -   Chief Complaint  Patient presents with   Follow-up     HPI Karesha IMAYA DUFFY 81 y.o. -presents with her husband.  After the recent flareup she is better.  However she ended up with back pain and sciatica 6 weeks ago.  She got prednisone course but it did not help.  Since then she has not acupuncture with massage therapy and she is significantly better although she still requiring a cane.  Therefore we did not walk her today.  She had a repeat pulmonary function test that shows slight improvement in FVC but the DLCO is still the same.  Her FVC over the last 5 to 8 years is range bound but the DLCO seems to taken a turn for the worse compared to several years ago.  Still the decline is not greater than 5 to 10% in the last 1 or 2 years to meet progressive phenotype definition.  I did visualize the CT scan currently with and also compared it to the old one.  There is definitely more ILD findings now compared to 2015 but is reported she does not meet the classic definition of progressive phenotype.  The pet bird she had is now deceased for the last 5 years.  Her son himself had IPF/ILDand had lung transplant several years ago.  She is willing to see genetics counselor.  Her husband prefers an aggressive approach with potential antifibrotic's because of the slow progression in  the future course being uncertain.  She prefers a more wait and watch approach.  We took a shared decision making to discuss in case conference repeat pulmonary function test and then decide    Husband is particularly worried about chronic cough.  Is particular worse in the night.  Is been going on for a long time but worse in the last 6 months definitely much worse at night.  She taking fish oil for many years but recently she has also been having heartburn.  There is no wheezing.  He is worried if it could be related to ILD.    SYMPTOM SCALE -  06/01/2020  12/06/2021  05/18/2022 Copd exac  08/01/2022   O2 use ra ra0 ra ra  Shortness of Breath 0 -> 5 scale with 5 being worst (score 6 If unable to do) 0    At rest 0 0 0 0  Simple tasks - showers, clothes change, eating, shaving 0 00 0 0  Household (dishes, doing bed, laundry) 0  0 0  Shopping 0 0 0 0  Walking level at own pace 0 0 0 0  Walking up Stairs '2 2 2 3  '$ Total (30-36)  Dyspnea Score '2 2 2 3  '$ How bad is your cough? '2 3 3 '$ 0  How bad is your fatigue 0 0 0 00  How bad is nausea 00 0 0 0  How bad is vomiting?  0 0 0 0  How bad is diarrhea? 0 0 0 0  How bad is anxiety? 0 0 0 0  How bad is depression 0 0 0 0     Simple office walk 185 feet x  3 laps goal with forehead probe 12/06/2021    O2 used ra   Number laps completed 3   Comments about pace avg   Resting Pulse Ox/HR 97% and 91/min   Final Pulse Ox/HR 93% and 112/min   Desaturated </= 88% no   Desaturated <= 3% points yes   Got Tachycardic >/= 90/min yes   Symptoms at end of test No complaints   Miscellaneous comments x     PFT     Latest Ref Rng & Units 08/01/2022    9:17 AM 05/18/2022    3:08 PM 04/06/2020   10:48 AM 07/09/2018   12:49 PM 01/01/2018    8:51 AM 09/28/2017    9:38 AM 04/03/2017    9:51 AM  PFT Results  FVC-Pre L 2.44  P 2.17  2.38  2.33  2.38  2.38  2.20   FVC-Predicted Pre % 87  P 78  83  79  81  81  77   FVC-Post L      2.37    FVC-Predicted  Post %      80    Pre FEV1/FVC % % 57  P 65  65  59  68  63  60   Post FEV1/FCV % %      70    FEV1-Pre L 1.40  P 1.41  1.55  1.38  1.63  1.51  1.32   FEV1-Predicted Pre % 67  P 67  72  62  74  68  62   FEV1-Post L      1.65    DLCO uncorrected ml/min/mmHg 10.86  P 10.58  13.11  12.18  20.54  12.59  11.50   DLCO UNC% % 55  P 54  66  47  80  49  47   DLCO corrected ml/min/mmHg 10.86  P 10.58  13.11    12.26  11.14   DLCO COR %Predicted % 55  P 54  66    47  45   DLVA Predicted % 64  P 63  73  57  59  58  54   TLC L      5.35    TLC % Predicted %      102    RV % Predicted %      120      P Preliminary result       has a past medical history of Coronary artery disease, Diverticulosis, HTN (hypertension), and Lung nodule.   reports that she quit smoking about 10 years ago. Her smoking use included cigarettes. She has a 37.50 pack-year smoking history. She has never used smokeless tobacco.  Past Surgical History:  Procedure Laterality Date   CARDIAC CATHETERIZATION     CORONARY ANGIOPLASTY     INTRAMEDULLARY (IM) NAIL INTERTROCHANTERIC Right 02/26/2022   Procedure: INTRAMEDULLARY (IM) NAIL INTERTROCHANTRIC;  Surgeon: Hiram Gash, MD;  Location: [facility];  Service: Orthopedics;  Laterality: Right;   TONSILLECTOMY AND ADENOIDECTOMY  No Known Allergies  Immunization History  Administered Date(s) Administered   Fluad Quad(high Dose 65+) 06/20/2022   Influenza Split 06/04/2013, 05/05/2014   Influenza Whole 05/28/2012   Influenza, High Dose Seasonal PF 06/05/2016, 06/04/2017, 05/20/2018, 05/06/2019   Influenza, Quadrivalent, Recombinant, Inj, Pf 05/25/2021   Influenza,inj,Quad PF,6+ Mos 06/05/2015   Moderna Sars-Covid-2 Vaccination 11/10/2019, 12/08/2019, 06/30/2020, 01/11/2021, 08/02/2021   PNEUMOCOCCAL CONJUGATE-20 11/17/2020   Pneumococcal Conjugate-13 01/26/2015   Pneumococcal Polysaccharide-23 09/04/2005, 09/24/2012   Tdap 03/20/2012    Family History  Problem Relation  Age of Onset   Heart attack Father    Kidney disease Father    COPD Mother    Lung cancer Brother 9   Pulmonary fibrosis Son        had lung transplant     Current Outpatient Medications:    albuterol (VENTOLIN HFA) 108 (90 Base) MCG/ACT inhaler, Inhale 2 puffs into the lungs every 6 (six) hours as needed for wheezing or shortness of breath., Disp: 8 g, Rfl: 2   Ascorbic Acid (VITAMIN C) 1000 MG tablet, Take 1,000 mg by mouth daily., Disp: , Rfl:    atorvastatin (LIPITOR) 10 MG tablet, TAKE 1 TABLET BY MOUTH  EVERY EVENING AFTER DINNER (Patient taking differently: Take 10 mg by mouth at bedtime.), Disp: 30 tablet, Rfl: 1   b complex vitamins tablet, Take 2 tablets by mouth daily., Disp: , Rfl:    calcium-vitamin D 250-100 MG-UNIT per tablet, Take 1 tablet by mouth daily. , Disp: , Rfl:    diltiazem (CARDIZEM) 60 MG tablet, Take 1 tablet (60 mg total) by mouth 3 (three) times daily., Disp: , Rfl:    diphenhydrAMINE (BENADRYL) 25 MG tablet, Take 50 mg by mouth at bedtime., Disp: , Rfl:    docusate sodium (COLACE) 100 MG capsule, Take 1 capsule (100 mg total) by mouth 2 (two) times daily., Disp: 10 capsule, Rfl: 0   doxycycline (VIBRA-TABS) 100 MG tablet, Take 1 tablet (100 mg total) by mouth 2 (two) times daily., Disp: 10 tablet, Rfl: 0   fish oil-omega-3 fatty acids 1000 MG capsule, Take 1,000 mg by mouth daily., Disp: , Rfl:    fluticasone furoate-vilanterol (BREO ELLIPTA) 100-25 MCG/ACT AEPB, INHALE ONE PUFF INTO THE LUNGS DAILY, Disp: 60 each, Rfl: 5   glucosamine-chondroitin 500-400 MG tablet, Take 1 tablet by mouth every morning., Disp: , Rfl:    LUTEIN PO, Take 1 tablet by mouth daily., Disp: , Rfl:    magnesium 30 MG tablet, Take 30 mg by mouth daily in the afternoon., Disp: , Rfl:    Melatonin 10 MG TABS, Take 10 mg by mouth at bedtime., Disp: , Rfl:    OVER THE COUNTER MEDICATION, Take 2 mg by mouth daily in the afternoon. Biotin 2 mg, Disp: , Rfl:    OVER THE COUNTER MEDICATION,  Place 1 drop into both eyes daily as needed (dry eye). OTC eye drop, Disp: , Rfl:    PAPAYA PO, Take 3 tablets by mouth daily as needed (heartburn)., Disp: , Rfl:    selenium 50 MCG TABS, Take 50 mcg by mouth daily., Disp: , Rfl:    senna (SENOKOT) 8.6 MG tablet, Take 2 tablets by mouth daily., Disp: , Rfl:    vitamin B-12 (CYANOCOBALAMIN) 1000 MCG tablet, Take 1,000 mcg by mouth daily., Disp: , Rfl:    vitamin E 200 UNIT capsule, Take 200 Units by mouth daily., Disp: , Rfl:       Objective:   Vitals:  08/01/22 1004  BP: 114/68  Pulse: 87  Temp: 97.6 F (36.4 C)  TempSrc: Oral  SpO2: 91%  Weight: 64.9 kg  Height: '5\' 5"'$  (1.651 m)    Estimated body mass index is 23.8 kg/m as calculated from the following:   Height as of this encounter: '5\' 5"'$  (1.651 m).   Weight as of this encounter: 64.9 kg.  '@WEIGHTCHANGE'$ @  Filed Weights   08/01/22 1004  Weight: 64.9 kg     Physical Exam    General: No distress. Looks well. HAS CANE Neuro: Alert and Oriented x 3. GCS 15. Speech normal Psych: Pleasant Resp:  Barrel Chest - no.  Wheeze - no, Crackles - yes mild base, No overt respiratory distress CVS: Normal heart sounds. Murmurs - no Ext: Stigmata of Connective Tissue Disease - no HEENT: Normal upper airway. PEERL +. 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 Pulmonary emphysema with fibrosis of lung ([place])  J43.9    J84.10     3. ILD (interstitial lung disease) ([place])  J84.9     4. Family history of pulmonary fibrosis  Z83.6     5. Long-term exposure involving bird droppings  Z77.29     6. Chronic cough  R05.3     7. Vaccine counseling  Z71.85          Plan:     Patient Instructions     ICD-10-CM   1. Combined pulmonary fibrosis and emphysema (CPFE) ([place])  J43.9    J84.10     2. Pulmonary emphysema with fibrosis of lung ([place])  J43.9    J84.10     3. ILD  (interstitial lung disease) ([place])  J84.9     4. Family history of pulmonary fibrosis  Z83.6     5. Long-term exposure involving bird droppings  Z77.29     6. Chronic cough  R05.3     7. 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  But ever so slightly since 2015 it is slowly gotten worse.  Precise reason is not known but prior exposure to bird droppings a family history could be playing a role  Plan - I will discuss in next possible case conference and our next few months to decide whether we should do continued wait and watch approach versus start empiric antifibrotic therapy versus do lung biopsy - Refer Roma Kayser and her team genetics counseling at [facility] -2 spirometry and DLCO in 3 months - We will discuss in a case conference  Chronic cough GERD  -Fish oil could be playing a role in this along with possible allergies or even your lung disease but fish oil could be the most likely culprit given the recent history of heartburn  Plan - Stop fish oil permanently  Vaccine counseling  -Glad you are up-to-date with the flu shot and COVID mRNA booster but please make sure you have the RSV vaccine  Plan -Return to  see Dr. Chase Caller for a 30-minute visit in 3 months but after spirometry and DLCO  -Symptom questionnaire and walking desaturation test at follow-up    ( Level 05 visit: Estb 40-54 min   visit type: on-site physical face to visit  in total care time and counseling or/and coordination of care by this undersigned MD - Dr Brand Males. This includes one or  more of the following on this same day 08/01/2022: pre-charting, chart review, note writing, documentation discussion of test results, diagnostic or treatment recommendations, prognosis, risks and benefits of management options, instructions, education, compliance or risk-factor reduction. It excludes time spent by the [place] or office staff in the care of the patient. Actual time 40 min)    SIGNATURE    Dr. Brand Males, M.D., F.C.C.P,  Pulmonary and Critical Care Medicine Staff Physician, [place] Director - Interstitial Lung Disease  Program  Pulmonary [place] at [place], [place], [address]  Pager: [phone], If no answer or between  15:00h - 7:00h: call [phone] Telephone: [phone]  11:03 AM 08/01/2022

## 2022-08-01 NOTE — Patient Instructions (Addendum)
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 Pulmonary emphysema with fibrosis of lung ([place])  J43.9    J84.10     3. ILD (interstitial lung disease) ([place])  J84.9     4. Family history of pulmonary fibrosis  Z83.6     5. Long-term exposure involving bird droppings  Z77.29     6. Chronic cough  R05.3     7. Vaccine counseling  Z71.85       Combined pulmonary fibrosis and emphysema (CPFE) ([place]) Pulmonary emphysema with fibrosis of lung ([facility])  -Clinically stable without any flareup  Plan - Continue inhaler therapy as before  ILD (interstitial lung disease) ([place]) Family history of pulmonary fibrosis Long-term exposure involving bird droppings  -You have extremely mild interstitial lung disease as of 2023 predominantly in the lung base.  But ever so slightly since 2015 it is slowly gotten worse.  Precise reason is not known but prior exposure to bird droppings a family history could be playing a role  Plan - I will discuss in next possible case conference and our next few months to decide whether we should do continued wait and watch approach versus start empiric antifibrotic therapy versus do lung biopsy - Refer Roma Kayser and her team genetics counseling at [facility] -2 spirometry and DLCO in 3 months - We will discuss in a case conference  Chronic cough GERD  -Fish oil could be playing a role in this along with possible allergies or even your lung disease but fish oil could be the most likely culprit given the recent history of heartburn  Plan - Stop fish oil permanently  Vaccine counseling  -Glad you are up-to-date with the flu shot and COVID mRNA booster but please make sure you have the RSV vaccine  Plan -Return to see Dr. Chase Caller for a 30-minute visit in 3 months but after spirometry and DLCO  -Symptom questionnaire and walking desaturation test at follow-up

## 2022-08-01 NOTE — Progress Notes (Signed)
Spirometry and Dlco done today. 

## 2022-08-09 DIAGNOSIS — M9905 Segmental and somatic dysfunction of pelvic region: Secondary | ICD-10-CM | POA: Diagnosis not present

## 2022-08-09 DIAGNOSIS — M5441 Lumbago with sciatica, right side: Secondary | ICD-10-CM | POA: Diagnosis not present

## 2022-08-09 DIAGNOSIS — M9903 Segmental and somatic dysfunction of lumbar region: Secondary | ICD-10-CM | POA: Diagnosis not present

## 2022-08-16 ENCOUNTER — Telehealth: Payer: Self-pay | Admitting: Internal Medicine

## 2022-08-16 ENCOUNTER — Ambulatory Visit (INDEPENDENT_AMBULATORY_CARE_PROVIDER_SITE_OTHER): Payer: Medicare Other | Admitting: Internal Medicine

## 2022-08-16 DIAGNOSIS — J849 Interstitial pulmonary disease, unspecified: Secondary | ICD-10-CM

## 2022-08-16 DIAGNOSIS — R911 Solitary pulmonary nodule: Secondary | ICD-10-CM

## 2022-08-16 DIAGNOSIS — M5441 Lumbago with sciatica, right side: Secondary | ICD-10-CM | POA: Diagnosis not present

## 2022-08-16 DIAGNOSIS — M9905 Segmental and somatic dysfunction of pelvic region: Secondary | ICD-10-CM | POA: Diagnosis not present

## 2022-08-16 DIAGNOSIS — M9903 Segmental and somatic dysfunction of lumbar region: Secondary | ICD-10-CM | POA: Diagnosis not present

## 2022-08-16 NOTE — Telephone Encounter (Signed)
Called and spoke with pt letting her know info per MR and she verbalized understanding. Order placed for CT and pt scheduled for a follow up with MR and PFT January 2024. Nothing further needed.

## 2022-08-16 NOTE — Progress Notes (Signed)
Interstitial Lung Disease Multidisciplinary Conference   Erin Li    MRN [id number]    DOB 04/10/1941  Primary Care 66, [name], DO  Referring Physician: Dr., Brand Males  Time of Conference: 7.30am- 8.30am Date of conference: 08/15/22 Location of Conference: -  Virtual  Participating Pulmonary: Dr. Brand Males, MD - yes,  Dr Marshell Garfinkel, MD - yes Pathology: Dr Jaquita Folds, MD - yes ,  Radiology: Dr Salvatore Marvel MD - yes, Others: Derl Barrow,  - APP, Drs [place] Bells, Meiere and Dewald  Brief History: "MR patient for > 18 years. Followed as gold stage 2 copd. but had bird that died 5 years ago. Son had transplant for IPF in [place] 10-15 years ago. She even was in copd trial.  Ratios always obstrucive with fev1 1.4L/67% and current DLCO 10.  Several years ago heard crackles in lung base but CT always read as no ILD and just atelectasis. Now CT says ILD and worse. FVC in 2018 - 2.2-2.38L -> now 2.44 DLC in 2018- 11.5-12.5 -> now 10.8 Does not meet Progr Phenotype definition for ILD (> 5-10% decline in 1-2 years) Questions:  Does she have ILD  Is this UIP  Would you biopsy OR Rx OR watch?"   PFT    Latest Ref Rng & Units 08/01/2022    9:17 AM 05/18/2022    3:08 PM 04/06/2020   10:48 AM 07/09/2018   12:49 PM 01/01/2018    8:51 AM 09/28/2017    9:38 AM 04/03/2017    9:51 AM  PFT Results  FVC-Pre L 2.44  P 2.17  2.38  2.33  2.38  2.38  2.20   FVC-Predicted Pre % 87  P 78  83  79  81  81  77   FVC-Post L      2.37    FVC-Predicted Post %      80    Pre FEV1/FVC % % 57  P 65  65  59  68  63  60   Post FEV1/FCV % %      70    FEV1-Pre L 1.40  P 1.41  1.55  1.38  1.63  1.51  1.32   FEV1-Predicted Pre % 67  P 67  72  62  74  68  62   FEV1-Post L      1.65    DLCO uncorrected ml/min/mmHg 10.86  P 10.58  13.11  12.18  20.54  12.59  11.50   DLCO UNC% % 55  P 54  66  47  80  49  47   DLCO corrected ml/min/mmHg 10.86  P 10.58  13.11    12.26  11.14    DLCO COR %Predicted % 55  P 54  66    47  45   DLVA Predicted % 64  P 63  73  57  59  58  54   TLC L      5.35    TLC % Predicted %      102    RV % Predicted %      120      P Preliminary result      MDD discussion of CT scan    - Date or time period of scan: "HRCT: 02/25/2022 HRCT: 10/01/2020 HRCT: 09/23/2019 HRCT: 03/30/2017 "  - Discussion synopsis:  2018 -> june 2023: [facility] CT bibasalar ILD (TB, GGO, subplerua reticualtion, No HC. No air trapping). Per [facility] - not  progressing.  in retrospect even in 2018 ILD +.. In 2015: ILA. Definite progression since 2015.  NEW RLL NODULAR OPACITY - June 2023 reported in conference but not in [facility] report - What is the final conclusion per 2018 ATS/Fleischner Criteria - PROBABLE UIP  - Concordance with official report: DISCORDANT - Conference has picked up a new RLL NODULE  Pathology discussion of biopsy NO:    MDD Impression/Recs: 1) nodule - consier PET/ repeat CT 2) Prob UIP (fam hx,  emphysema, age > 65,s eroplogy) = IPF -> Rx with antifibotic. 3) emphysema + -> currently ILD > emphysema but barely    Time Spent in preparation and discussion:  > 30 min    SIGNATURE   Dr. Brand Males, M.D., F.C.C.P,  Pulmonary and Critical Care Medicine Staff Physician, [place] Director - Interstitial Lung Disease  Program  Pulmonary [place] at [place], [place], [address]  Pager: [phone], If no answer or between  15:00h - 7:00h: call [phone] Telephone: [phone]  10:14 AM 08/16/2022 ...................................................................................................................[name] References: Diagnosis of Hypersensitivity Pneumonitis in Adults. An Official ATS/JRS/ALAT Clinical Practice Guideline. Ragu G et al, [place] Aug 1;202(3):e36-e69.       Diagnosis of Idiopathic Pulmonary Fibrosis. An Official  ATS/ERS/JRS/ALAT Clinical Practice Guideline. Raghu G et al, [place]. 2018 Sep 1;198(5):e44-e68.   IPF Suspected   Histopath ology Pattern      UIP  Probable UIP  Indeterminate for  UIP  Alternative  diagnosis    UIP  IPF  IPF  IPF  Non-IPF dx   HRCT   Probabe UIP  IPF  IPF  IPF (Likely)**  Non-IPF dx  Pattern  Indeterminate for UIP  IPF  IPF (Likely)**  Indeterminate  for IPF**  Non-IPF dx    Alternative diagnosis  IPF (Likely)**/ non-IPF dx  Non-IPF dx  Non-IPF dx  Non-IPF dx     Idiopathic pulmonary fibrosis diagnosis based upon HRCT and Biopsy paterns.  ** IPF is the likely diagnosis when any of following features are present:  Moderate-to-severe traction bronchiectasis/bronchiolectasis (defined as mild traction bronchiectasis/bronchiolectasis in four or more lobes including the lingual as a lobe, or moderate to severe traction bronchiectasis in two or more lobes) in a man over age 72 years or in a woman over age 104 years Extensive (>30%) reticulation on HRCT and an age >70 years  Increased neutrophils and/or absence of lymphocytosis in BAL fluid  Multidisciplinary discussion reaches a confident diagnosis of IPF.   **Indeterminate for IPF  Without an adequate biopsy is unlikely to be IPF  With an adequate biopsy may be reclassified to a more specific diagnosis after multidisciplinary discussion and/or additional consultation.   dx = diagnosis; HRCT = high-resolution computed tomography; IPF = idiopathic pulmonary fibrosis; UIP = usual interstitial pneumonia.

## 2022-08-16 NOTE — Telephone Encounter (Addendum)
Erin Li at [facility]  1) NEW RLL Nodule diagnosed in June 2023 CT . Not in official report . Probably fibrotic but the conference radiologist was concerned. PLAN:  SUPER D CT scan ideally dec 2023  2) give her app tin Jan or feb  2024 to discuss next steps but ideally needs [email or web address]       Latest Ref Rng & Units 08/01/2022    9:17 AM 05/18/2022    3:08 PM 04/06/2020   10:48 AM 07/09/2018   12:49 PM 01/01/2018    8:51 AM 09/28/2017    9:38 AM 04/03/2017    9:51 AM  PFT Results  FVC-Pre L 2.44  P 2.17  2.38  2.33  2.38  2.38  2.20   FVC-Predicted Pre % 87  P 78  83  79  81  81  77   FVC-Post L      2.37    FVC-Predicted Post %      80    Pre FEV1/FVC % % 57  P 65  65  59  68  63  60   Post FEV1/FCV % %      70    FEV1-Pre L 1.40  P 1.41  1.55  1.38  1.63  1.51  1.32   FEV1-Predicted Pre % 67  P 67  72  62  74  68  62   FEV1-Post L      1.65    DLCO uncorrected ml/min/mmHg 10.86  P 10.58  13.11  12.18  20.54  12.59  11.50   DLCO UNC% % 55  P 54  66  47  80  49  47   DLCO corrected ml/min/mmHg 10.86  P 10.58  13.11    12.26  11.14   DLCO COR %Predicted % 55  P 54  66    47  45   DLVA Predicted % 64  P 63  73  57  59  58  54   TLC L      5.35    TLC % Predicted %      102    RV % Predicted %      120      P Preliminary result

## 2022-09-01 ENCOUNTER — Ambulatory Visit (HOSPITAL_BASED_OUTPATIENT_CLINIC_OR_DEPARTMENT_OTHER)
Admission: RE | Admit: 2022-09-01 | Discharge: 2022-09-01 | Disposition: A | Discharge status: Home / Self Care | Payer: Medicare Other | Source: Ambulatory Visit | Attending: Internal Medicine | Admitting: Internal Medicine

## 2022-09-01 DIAGNOSIS — R911 Solitary pulmonary nodule: Secondary | ICD-10-CM | POA: Diagnosis not present

## 2022-09-01 DIAGNOSIS — J439 Emphysema, unspecified: Secondary | ICD-10-CM | POA: Diagnosis not present

## 2022-09-01 DIAGNOSIS — J479 Bronchiectasis, uncomplicated: Secondary | ICD-10-CM | POA: Diagnosis not present

## 2022-09-18 NOTE — Progress Notes (Signed)
IMPRESSION: Dominant nodule in the superior segment of the right lower lobe has been present since 2014 consistent with a benign process. Other nodules also demonstrated long-term presence consistent with benign process.  Underlying emphysematous lung changes. Interstitial process also seen as on the prior high-resolution chest CT scan. Xxxxxx Discuss in clinic   Electronically Signed   By: Jill Side M.D.   On: 09/05/2022 18:24

## 2022-09-22 ENCOUNTER — Other Ambulatory Visit: Payer: Self-pay

## 2022-09-22 ENCOUNTER — Encounter: Payer: Self-pay | Admitting: Internal Medicine

## 2022-09-22 ENCOUNTER — Ambulatory Visit (INDEPENDENT_AMBULATORY_CARE_PROVIDER_SITE_OTHER): Payer: Medicare Other | Admitting: Internal Medicine

## 2022-09-22 ENCOUNTER — Ambulatory Visit: Payer: Medicare Other | Admitting: Internal Medicine

## 2022-09-22 VITALS — BP 112/62 | HR 74 | Temp 98.0°F | Ht 65.0 in | Wt 145.0 lb

## 2022-09-22 DIAGNOSIS — R053 Chronic cough: Secondary | ICD-10-CM | POA: Diagnosis not present

## 2022-09-22 DIAGNOSIS — J439 Emphysema, unspecified: Secondary | ICD-10-CM

## 2022-09-22 DIAGNOSIS — R911 Solitary pulmonary nodule: Secondary | ICD-10-CM

## 2022-09-22 DIAGNOSIS — J8410 Pulmonary fibrosis, unspecified: Secondary | ICD-10-CM | POA: Diagnosis not present

## 2022-09-22 DIAGNOSIS — J849 Interstitial pulmonary disease, unspecified: Secondary | ICD-10-CM

## 2022-09-22 LAB — PULMONARY FUNCTION TEST
DL/VA % pred: 66 %
DL/VA: 2.67 ml/min/mmHg/L
DLCO cor % pred: 54 %
DLCO cor: 10.61 ml/min/mmHg
DLCO unc % pred: 54 %
DLCO unc: 10.61 ml/min/mmHg
FEF 25-75 Pre: 0.58 L/sec
FEF2575-%Pred-Pre: 39 %
FEV1-%Pred-Pre: 69 %
FEV1-Pre: 1.41 L
FEV1FVC-%Pred-Pre: 79 %
FEV6-%Pred-Pre: 89 %
FEV6-Pre: 2.33 L
FEV6FVC-%Pred-Pre: 102 %
FVC-%Pred-Pre: 87 %
FVC-Pre: 2.4 L
Pre FEV1/FVC ratio: 59 %
Pre FEV6/FVC Ratio: 97 %

## 2022-09-22 LAB — HEPATIC FUNCTION PANEL
ALT: 22 U/L (ref 0–35)
AST: 27 U/L (ref 0–37)
Albumin: 4.3 g/dL (ref 3.5–5.2)
Alkaline Phosphatase: 96 U/L (ref 39–117)
Bilirubin, Direct: 0.1 mg/dL (ref 0.0–0.3)
Total Bilirubin: 0.5 mg/dL (ref 0.2–1.2)
Total Protein: 7.9 g/dL (ref 6.0–8.3)

## 2022-09-22 LAB — CBC WITH DIFFERENTIAL/PLATELET
Basophils Absolute: 0 10*3/uL (ref 0.0–0.1)
Basophils Relative: 0.5 % (ref 0.0–3.0)
Eosinophils Absolute: 0.2 10*3/uL (ref 0.0–0.7)
Eosinophils Relative: 3.3 % (ref 0.0–5.0)
HCT: 42.9 % (ref 36.0–46.0)
Hemoglobin: 14.7 g/dL (ref 12.0–15.0)
Lymphocytes Relative: 18.6 % (ref 12.0–46.0)
Lymphs Abs: 1.1 10*3/uL (ref 0.7–4.0)
MCHC: 34.3 g/dL (ref 30.0–36.0)
MCV: 98.7 fl (ref 78.0–100.0)
Monocytes Absolute: 0.7 10*3/uL (ref 0.1–1.0)
Monocytes Relative: 12.4 % — ABNORMAL HIGH (ref 3.0–12.0)
Neutro Abs: 3.9 10*3/uL (ref 1.4–7.7)
Neutrophils Relative %: 65.2 % (ref 43.0–77.0)
Platelets: 310 10*3/uL (ref 150.0–400.0)
RBC: 4.35 Mil/uL (ref 3.87–5.11)
RDW: 14 % (ref 11.5–15.5)
WBC: 6 10*3/uL (ref 4.0–10.5)

## 2022-09-22 LAB — BASIC METABOLIC PANEL
BUN: 15 mg/dL (ref 6–23)
CO2: 26 mEq/L (ref 19–32)
Calcium: 10 mg/dL (ref 8.4–10.5)
Chloride: 106 mEq/L (ref 96–112)
Creatinine, Ser: 0.71 mg/dL (ref 0.40–1.20)
GFR: 80.5 mL/min (ref >60.00)
Glucose, Bld: 102 mg/dL — ABNORMAL HIGH (ref 70–99)
Potassium: 3.8 mEq/L (ref 3.5–5.1)
Sodium: 141 mEq/L (ref 135–145)

## 2022-09-22 LAB — CK: Total CK: 56 U/L (ref 7–177)

## 2022-09-22 NOTE — Addendum Note (Signed)
Addended by: Alvin Critchley on: 09/22/2022 02:01 PM   Modules accepted: Orders

## 2022-09-22 NOTE — Progress Notes (Signed)
OV 04/10/2017   Chief Complaint  Patient presents with   Follow-up    Pt states her breathing is unchanged since last OV. Pt states she has mild non prod cough. Pt denies CP/tightness, f/c/s, and chest congestion.    Follow-up COPD but now has concerned that she has developed interstitial lung disease  She is here with her husband. Back in 2015 a high-resolution CT chest because I thought I heard crackles and thoracic radiology ruled out interstitial lung disease. In the interim she's had lung cancer screening CT chest at overall stability in her pulmonary function test. But there was concern because of crackles that she might have interstitial lung disease so we did a high-resolution CT chest and July 2018 and thoracic radiologist reported as definite precedence of ILD although it is not a UIP pattern. [name] She does say that her son and over the lung transplant in [place] 8 years ago at age of 97 because of IPF. She did have autoimmune panel that is only trace positive for ENP . 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 She denies any humidifier or mildew or mold in the house. The CT chest also shows coronary artery calcification. Of note I personally visualized the CT chest.  ACCP ILD questiin  - symptioms: occ cough x 2 years with some night cough and mild phleogn ona nd off. Very mild non troubling dyspnea x 3 years - pasth medical : GERD +, mild dysphagia + - personal exposure x -: +ver for remote use of rec / street drugs. Cig smoked age 84 though 59 at 63 cigs/dat - fam hx: COPD +, son with transplant for ?  IPF at age 66 - home exposurE: does have hot tube - uses it once a month. Has a Macaw bird x > 20 years - residence: [place], [place], [place], [place], [place] - occupatin: bookeeper - pulmotiox hx: negagive  Results for ISATU, MACINNES (MRN [id number]) as  of 04/10/2017 11:21  Ref. Range 04/03/2017 10:18  CK Total Latest Ref Range: 7 - 177 U/L 63  CK-MB Latest Ref Range: 0.3 - 4.0 ng/mL 2.4  Aldolase Latest Ref Range: <=8.1 U/L 3.5  Sed Rate Latest Ref Range: 0 - 30 mm/hr 20  Anit Nuclear Antibody(ANA) Latest Ref Range: NEGATIVE  NEG  Angiotensin-Converting Enzyme Latest Ref Range: 9 - 67 U/L 67  Cyclic Citrullin Peptide Ab Latest Units: Units <16  ds DNA Ab Latest Units: IU/mL 1  ENA RNP Ab Latest Ref Range: 0.0 - 0.9 AI 1.4 (H)  RA Latex Turbid. Latest Ref Range: <14 IU/mL <14  SSA (Ro) (ENA) Antibody, IgG Latest Ref Range: <1.0 NEG AI  <1.0 NEG  SSB (La) (ENA) Antibody, IgG Latest Ref Range: <1.0 NEG AI  <1.0 NEG  Scleroderma (Scl-70) (ENA) Antibody, IgG Latest Ref Range: <1.0 NEG AI  <1.0 NEG    OV 09/28/2017  Chief Complaint  Patient presents with   Follow-up    PFT done today. States she has been doing good and has no complaints of cough, SOB, or CP.    Fu mixed empyhsema and pulmonary fibrosis  For  years of only known who is a COPD patient with a family history of pulmonary fibrosis.  But it appears that in 2018 she started developing signs of pulmonary fibrosis with crackles  and findings of indeterminate UIP on CT chest July 2018.  She has family history of pulmonary fibrosis and that her son had lung transplant for the same.  She now presents for follow-up.  In the interim she started feeling better.  She is less dyspneic.  She continues with the Brio.  There are no new issues.  Pulmonary function test done as documented below shows an improvement.  Therefore instead of biopsy she feels keeping an eye on this is a better approach.    Walking desaturation test on 09/28/2017 185 feet x 3 laps on ROOM AIR:  did NOT desaturate. Rest pulse ox was 99%, final pulse ox was96 %. HR response 78 /min at rest to 105/min at peak exertion. Patient [place]  Did nto Desaturate < 88% . Erin Li yes  Desaturated </= 3% points.  Erin Li yes did get tachyardic   OV 01/01/2018  Chief Complaint  Patient presents with   Follow-up    PFT done today.  Pt states she has been doing good since last visit. Had a cold mid January 2019 but has been better since. Denies any complaints of cough, SOB, or CP.   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 She continues on Brio. Since last visit January 2019 she continues to feel better. COPD cat score is very minimal as documented below. She had pulmonary function test today and this shows stability in her FVC after initial improvement. Her DLCO is even better. She is on observation course regarding her ILD given the improvement in her pulmonary function test. Her last CT scan of the chest was July 2018        OV 07/09/2018  Subjective:  Patient ID: Erin Li, female , DOB: August 13, 1941 , age 82 y.o. , MRN: [id number] , ADDRESS: [place] [address]   07/09/2018 -   Chief Complaint  Patient presents with   Consult    Alot of coughing producing mucus no color at this time.     HPI Erin Li 82 y.o. 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  She tells me that for the last 3 weeks has had increased cough with congestion and white sputum more than baseline but activities of daily living and shortness of breath or guarding is all unchanged and she has good effort tolerance.  There are no new other issues.  In terms of ILD: Last visit I thought I will be in improved based on improvement in pulmonary function test but actually that might of been a red herring because today's pulmonary function test shows a decline which is in line with previous pulmonary function test showing stability.  She certainly feels stable.  In  July 2019 she had a low-dose CT scan for lung cancer and this ruled out lung cancer on screening.  They were able to report some mildly changes at the base.  She is not interested in surgical lung biopsy because her son who had pulmonary fibrosis finally died from 82 years of transplant from pulmonary embolism, stroke and acute renal failure and chronic critical illness in the rehab facility.      OV 10/08/2019  Subjective:  Patient ID: Erin Li, female , DOB: [birth year] , age 83 y.o. ,  MRN: [id number] , ADDRESS: [place] [address]   10/08/2019 -   Chief Complaint  Patient presents with   Follow-up    Pt states she has been doing well since last visit and states her breathing has been doing well. Pt will have an occ cough.   predominantly COPD with some amount of basal pulmonary fibrosis  N(atlernate to UIP)  in the setting of her son having pulmonary fibrosis alt- decieasedd 2019    HPI Erin Li 82 y.o. 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  Last seen in #2019.  She presents with her husband.  Since the onset of the COVID-19 pandemic there have been in the house and socially isolated.  She has been doing well without any problems overall stable.  She not exercising as much as she used to.  She not taking any inhalers for her COPD.  She only takes Mucinex.  Therefore her cough symptom with mucus is slightly worse than before.  She had high-resolution CT scan of the chest which I reviewed the report and agree with the findings and I interpreted as well with visualization: She has emphysema associated with alternate diagnosis pattern.  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  She still grieving from her son who died in 05-20-18 from pulmonary fibrosis.    OV 06/01/2020   Subjective:  Patient ID: Erin Li, female , DOB: Jan 10, 1941, age 75 y.o. years. , MRN: [id number],  ADDRESS: [place]  [id number] PCP  [name], MD Providers : Treatment Team:  Attending Provider: Brand Males, MD   Chief Complaint  Patient presents with   Follow-up    ILD, no concerns    predominantly COPD with some amount of basal pulmonary fibrosis  N(atlernate to UIP)  in the setting of her son having pulmonary fibrosis alt- decieasedd 2019   HPI Erin Li 82 y.o. -presents for follow-up.  Last seen February 2021.  Since then she is doing stable.  She continues to be stable.  Her symptom scores are very minimal and shows stability.  She had pulmonary function test August 2021 and compared to November 2019 she is stable.  Her walking desaturation test is also stable.  Her last CT scan was January 2021.  She prefers to have monitoring for pulmonary fibrosis because of her son's issue.  She likes the strategy of alternating pulmonary function test and also CT scan of the chest.      OV 12/06/2021  Subjective:  Patient ID: Erin Li, female , DOB: 1941-03-20 , age 81 y.o. , MRN: [id number] , ADDRESS: [place] [address] PCP [name], MD Patient Care Team: [name], MD as PCP - General (Internal Medicine)  This Provider for this visit: Treatment Team:  Attending Provider: Brand Males, MD   Follow-up emphysema with some amount of ILD.  Last seen in May 20, 2020.  It is now 18 months since I last saw her.  Doing well overall.  Last month she did have a flareup requiring antibiotics and prednisone for COPD exacerbation.  She continues on [place].  No new health issues.  April 23, 2022 she plans to go to [place].  She is wanting antibiotic and prednisone.  Her last pulmonary function test was a year and a half ago.  Her last CT scan of the chest was a year ago or more.  She is agreeable to have follow-up reassessment done.  Symptom score  is as below and stable.  Walking desaturation test shows a tendency to desaturate but still acceptable.    OV  05/18/2022   Subjective:  Patient ID: Erin Li, female , DOB: Mar 02, 1941, age 21 y.o. years. , MRN: [id number],  ADDRESS: [place] [address] PCP  Janie Morning, DO Providers : Treatment Team:  Attending Provider: Brand Males, MD Patient Care Team: Janie Morning, DO as PCP - General (Family Medicine)    Chief Complaint  Patient presents with   Follow-up    PFT performed today.  Pt states she has been doing okay since last visit. States for the past 2 weeks she has been coughing a lot getting up white phlegm and states cough is worse at night.    Follow-up emphysema with some amount of ILD   HPI Erin Li 63 y.o. -[son also has ILD].  Returns for follow-up.  In August 2023 she went to [place] with her female side of the family.  She return May 02, 2022.  She had a great time.  No issues walking around the.  Then after coming back for the last 2 weeks she has respiratory symptoms particularly increased cough for the last 2 to 3 weeks associated with white sputum occasionally yellow.  Definitely worse than baseline.  Moderate in severity and but no fever.  No chills no worsening shortness of breath.  No wheezing.  No edema no orthopnea no chills.  However pulmonary function test today shows a decline [see below].  Despite this increase in symptoms her subjective symptom score below is stable.  Of note her husband is here with her today.    PFT   Narrative & Impression  CLINICAL DATA:  Interstitial lung disease   EXAM: CT CHEST WITHOUT CONTRAST   TECHNIQUE: Multidetector CT imaging of the chest was performed following the standard protocol without intravenous contrast. High resolution imaging of the lungs, as well as inspiratory and expiratory imaging, was performed.   RADIATION DOSE REDUCTION: This exam was performed according to the departmental dose-optimization program which includes automated exposure control, adjustment of the  mA and/or kV according to patient size and/or use of iterative reconstruction technique.   COMPARISON:  10/01/2020, 09/23/2019, 09/27/2018   FINDINGS: Cardiovascular: Aortic atherosclerosis. Normal heart size. Left and right coronary artery calcifications. No pericardial effusion.   Mediastinum/Nodes: No enlarged mediastinal, hilar, or axillary lymph nodes. Thyroid gland, trachea, and esophagus demonstrate no significant findings.   Lungs/Pleura: Moderate centrilobular and paraseptal emphysema. Unchanged mild pulmonary fibrosis in a pattern with apical to basal gradient, featuring irregular peripheral interstitial opacity, septal thickening, and ground-glass, mild, tubular bronchiectasis, with small areas of subpleural bronchiolectasis at the lung bases. No significant air trapping on expiratory phase imaging. Multiple small bilateral pulmonary nodules are stable and benign, largest a 0.9 cm nodule of the superior segment right lower lobe (series 4, image 57). No pleural effusion or pneumothorax.   Upper Abdomen: No acute abnormality.   Musculoskeletal: No chest wall abnormality. No suspicious osseous lesions identified.   IMPRESSION: 1. Unchanged mild pulmonary fibrosis in a pattern with apical to basal gradient, featuring irregular peripheral interstitial opacity, septal thickening, and ground-glass, with small areas of subpleural bronchiolectasis at the lung bases. Fibrotic findings are not significantly changed compared to immediate prior examination, although as previously reported are worsened over a longer period of time dating back to at least 2015. Findings are categorized as probable UIP per consensus guidelines: Diagnosis of Idiopathic Pulmonary Fibrosis:  An Official ATS/ERS/JRS/ALAT Clinical Practice Guideline. [place], Iss 5, [phone], May 05 2017. 2. Multiple small bilateral pulmonary nodules are stable and definitively benign. 3.  Emphysema. 4. Coronary artery disease.   Aortic Atherosclerosis (ICD10-I70.0) and Emphysema (ICD10-J43.9).     Electronically Signed   By: Delanna Ahmadi M.D.   On: 02/25/2022 17:51    No results found.     OV 08/01/2022  Subjective:  Patient ID: Erin Li, female , DOB: Nov 02, 1940 , age 71 y.o. , MRN: [id number] , ADDRESS: [place] [address] PCP Janie Morning, DO Patient Care Team: Janie Morning, DO as PCP - General (Family Medicine)  This Provider for this visit: Treatment Team:  Attending Provider: Brand Males, MD  Follow-up emphysema with some amount of ILD  08/01/2022 -   Chief Complaint  Patient presents with   Follow-up     HPI Erin Li 82 y.o. -presents with her husband.  After the recent flareup she is better.  However she ended up with back pain and sciatica 6 weeks ago.  She got prednisone course but it did not help.  Since then she has not acupuncture with massage therapy and she is significantly better although she still requiring a cane.  Therefore we did not walk her today.  She had a repeat pulmonary function test that shows slight improvement in FVC but the DLCO is still the same.  Her FVC over the last 5 to 8 years is range bound but the DLCO seems to taken a turn for the worse compared to several years ago.  Still the decline is not greater than 5 to 10% in the last 1 or 2 years to meet progressive phenotype definition.  I did visualize the CT scan currently with and also compared it to the old one.  There is definitely more ILD findings now compared to 2015 but is reported she does not meet the classic definition of progressive phenotype.  The pet bird she had is now deceased for the last 5 years.  Her son himself had IPF/ILDand had lung transplant several years ago.  She is willing to see genetics counselor.  Her husband prefers an aggressive approach with potential antifibrotic's because of the slow progression in  the future course being uncertain.  She prefers a more wait and watch approach.  We took a shared decision making to discuss in case conference repeat pulmonary function test and then decide    Husband is particularly worried about chronic cough.  Is particular worse in the night.  Is been going on for a long time but worse in the last 6 months definitely much worse at night.  She taking fish oil for many years but recently she has also been having heartburn.  There is no wheezing.  He is worried if it could be related to ILD.  Xxxxx[id number]  DEC 2023 Brief History: "MR patient for > 18 years. Followed as gold stage 2 copd. but had bird that died 5 years ago. Son had transplant for IPF in [place] 10-15 years ago. She even was in copd trial.  Ratios always obstrucive with fev1 1.4L/67% and current DLCO 10.  Several years ago heard crackles in lung base but CT always read as no ILD and just atelectasis. Now CT says ILD and worse. FVC in 2018 - 2.2-2.38L -> now 2.44 DLC in 2018- 11.5-12.5 -> now 10.8 Does not meet Progr Phenotype definition for ILD (>  5-10% decline in 1-2 years) Questions:  Does she have ILD  Is this UIP  Would you biopsy OR Rx OR watch?"  iscussion synopsis:  2018 -> june 2023: [facility] CT bibasalar ILD (TB, GGO, subplerua reticualtion, No HC. No air trapping). Per [facility] - not progressing.  in retrospect even in 2018 ILD +.. In 2015: ILA. Definite progression since 2015.  NEW RLL NODULAR OPACITY - June 2023 reported in conference but not in [facility] report - What is the final conclusion per 2018 ATS/Fleischner Criteria - PROBABLE UIP  - Concordance with official report: DISCORDANT - Conference has picked up a new RLL NODULE  Pathology discussion of biopsy NO:    MDD Impression/Recs: 1) nodule - consier PET/ repeat CT 2) Prob UIP (fam hx,  emphysema, age > 65,s eroplogy) = IPF -> Rx with antifibotic. 3) emphysema + -> currently ILD > emphysema but barely      IMPRESSION: Dominant nodule in the superior segment of the right lower lobe has been present since 2014 consistent with a benign process. Other nodules also demonstrated long-term presence consistent with benign process.   Underlying emphysematous lung changes. Interstitial process also seen as on the prior high-resolution chest CT scan.     Electronically Signed   By: Jill Side M.D.   On: 09/05/2022 18:24 OV 09/22/2022  Subjective:  Patient ID: Erin Li, female , DOB: 18-May-1941 , age 22 y.o. , MRN: [id number] , ADDRESS: [place] [address] PCP Janie Morning, DO Patient Care Team: Janie Morning, DO as PCP - General (Family Medicine)  This Provider for this visit: Treatment Team:  Attending Provider: Brand Males, MD    09/22/2022 -   Chief Complaint  Patient presents with   Follow-up    Pft review,      HPI Erin Li 82 y.o. -returns for follow-up.  At this visit she is here with her husband.  He is acting as an independent historian.  She tells me that overall she is stable but she is having chronic cough.  She is quite bothered by the chronic cough.  She tells me that over the last year or 2 it slowly gotten worse but since last visit it is stable.  Nighttime cough is worse than daytime.  It does wake her husband up.  She does bring "junk".  It is clear but sometimes slightly yellow-tinged.  There is no wheezing.  Happening despite Breo.  Happening despite stopping fish oil.  Her shortness of breath is stable.  We discussed several things today  -Chronic cough: Okay to restart fish oil but will also check blood allergy panel and CBC with differential -COPD: Is stable.  She will continue inhaler therapy  -Right lower lobe lung nodule: This was parted during the case conference.  We did a super D CT chest and the current radiologist has said it has been stable for 10 years.  No further follow-up  -Pulmonary fibrosis: The  concern at the conference is that she has IPF based on probable UIP on the CT chest, slow emergence and progression and also son having pulmonary fibrosis IPF.  Last serologies showed trace ANA positive but that was 10 years ago.  She is agreeable for repeat.  We discussed therapeutic options including standard of care nintedanib.  She said that many years ago she did have a cardiac cath and she did have a blockage but no stent was placed or angioplasty was done.  We also  discussed standard of care pirfenidone and the side effects.  We then discussed a modified pirfenidone through a clinical trial [facility].  The side effect profile here is less but for the first 6 months she could get placebo standard of care pirfenidone or the study drug.  Then after 6 months should be randomized to different doses of the study drug which research is on show for his one third less side effect profile than standard of care pirfenidone.  She is more interested in this option.[name]  However we do need to get serologies.      SYMPTOM SCALE -  06/01/2020  12/06/2021  05/18/2022 Copd exac  08/01/2022   O2 use ra ra0 ra ra  Shortness of Breath 0 -> 5 scale with 5 being worst (score 6 If unable to do) 0    At rest 0 0 0 0  Simple tasks - showers, clothes change, eating, shaving 0 00 0 0  Household (dishes, doing bed, laundry) 0  0 0  Shopping 0 0 0 0  Walking level at own pace 0 0 0 0  Walking up Stairs '2 2 2 3  '$ Total (30-36) Dyspnea Score '2 2 2 3  '$ How bad is your cough? '2 3 3 '$ 0  How bad is your fatigue 0 0 0 00  How bad is nausea 00 0 0 0  How bad is vomiting?  0 0 0 0  How bad is diarrhea? 0 0 0 0  How bad is anxiety? 0 0 0 0  How bad is depression 0 0 0 0     Simple office walk 185 feet x  3 laps goal with forehead probe 12/06/2021    O2 used ra   Number laps completed 3   Comments about pace avg   Resting Pulse Ox/HR 97% and 91/min   Final Pulse Ox/HR 93% and 112/min   Desaturated </= 88% no   Desaturated  <= 3% points yes   Got Tachycardic >/= 90/min yes   Symptoms at end of test No complaints   Miscellaneous comments x     PFT     Latest Ref Rng & Units 09/22/2022   11:43 AM 08/01/2022    9:17 AM 05/18/2022    3:08 PM 04/06/2020   10:48 AM 07/09/2018   12:49 PM 01/01/2018    8:51 AM 09/28/2017    9:38 AM  PFT Results  FVC-Pre L 2.40  P 2.44  P 2.17  2.38  2.33  2.38  2.38   FVC-Predicted Pre % 87  P 87  P 78  83  79  81  81   FVC-Post L       2.37   FVC-Predicted Post %       80   Pre FEV1/FVC % % 59  P 57  P 65  65  59  68  63   Post FEV1/FCV % %       70   FEV1-Pre L 1.41  P 1.40  P 1.41  1.55  1.38  1.63  1.51   FEV1-Predicted Pre % 69  P 67  P 67  72  62  74  68   FEV1-Post L       1.65   DLCO uncorrected ml/min/mmHg 10.61  P 10.86  P 10.58  13.11  12.18  20.54  12.59   DLCO UNC% % 54  P 55  P 54  66  47  80  49  DLCO corrected ml/min/mmHg 10.61  P 10.86  P 10.58  13.11    12.26   DLCO COR %Predicted % 54  P 55  P 54  66    47   DLVA Predicted % 66  P 64  P 63  73  57  59  58   TLC L       5.35   TLC % Predicted %       102   RV % Predicted %       120     P Preliminary result       has a past medical history of Coronary artery disease, Diverticulosis, HTN (hypertension), and Lung nodule.   reports that she quit smoking about 10 years ago. Her smoking use included cigarettes. She has a 37.50 pack-year smoking history. She has never used smokeless tobacco.  Past Surgical History:  Procedure Laterality Date   CARDIAC CATHETERIZATION     CORONARY ANGIOPLASTY     INTRAMEDULLARY (IM) NAIL INTERTROCHANTERIC Right 02/26/2022   Procedure: INTRAMEDULLARY (IM) NAIL INTERTROCHANTRIC;  Surgeon: Hiram Gash, MD;  Location: [facility];  Service: Orthopedics;  Laterality: Right;   TONSILLECTOMY AND ADENOIDECTOMY      No Known Allergies  Immunization History  Administered Date(s) Administered   Fluad Quad(high Dose 65+) 06/20/2022   Influenza Split 06/04/2013, 05/05/2014    Influenza Whole 05/28/2012   Influenza, High Dose Seasonal PF 06/05/2016, 06/04/2017, 05/20/2018, 05/06/2019   Influenza, Quadrivalent, Recombinant, Inj, Pf 05/25/2021   Influenza,inj,Quad PF,6+ Mos 06/05/2015   Moderna Sars-Covid-2 Vaccination 11/10/2019, 12/08/2019, 06/30/2020, 01/11/2021, 08/02/2021   PNEUMOCOCCAL CONJUGATE-20 11/17/2020   Pneumococcal Conjugate-13 01/26/2015   Pneumococcal Polysaccharide-23 09/04/2005, 09/24/2012   Tdap 03/20/2012    Family History  Problem Relation Age of Onset   Heart attack Father    Kidney disease Father    COPD Mother    Lung cancer Brother 22   Pulmonary fibrosis Son        had lung transplant     Current Outpatient Medications:    albuterol (VENTOLIN HFA) 108 (90 Base) MCG/ACT inhaler, Inhale 2 puffs into the lungs every 6 (six) hours as needed for wheezing or shortness of breath., Disp: 8 g, Rfl: 2   Ascorbic Acid (VITAMIN C) 1000 MG tablet, Take 1,000 mg by mouth daily., Disp: , Rfl:    atorvastatin (LIPITOR) 10 MG tablet, TAKE 1 TABLET BY MOUTH  EVERY EVENING AFTER DINNER (Patient taking differently: Take 10 mg by mouth at bedtime.), Disp: 30 tablet, Rfl: 1   b complex vitamins tablet, Take 2 tablets by mouth daily., Disp: , Rfl:    calcium-vitamin D 250-100 MG-UNIT per tablet, Take 1 tablet by mouth daily. , Disp: , Rfl:    diltiazem (CARDIZEM) 60 MG tablet, Take 1 tablet (60 mg total) by mouth 3 (three) times daily., Disp: , Rfl:    diphenhydrAMINE (BENADRYL) 25 MG tablet, Take 50 mg by mouth at bedtime., Disp: , Rfl:    docusate sodium (COLACE) 100 MG capsule, Take 1 capsule (100 mg total) by mouth 2 (two) times daily., Disp: 10 capsule, Rfl: 0   doxycycline (VIBRA-TABS) 100 MG tablet, Take 1 tablet (100 mg total) by mouth 2 (two) times daily., Disp: 10 tablet, Rfl: 0   fluticasone furoate-vilanterol (BREO ELLIPTA) 100-25 MCG/ACT AEPB, INHALE ONE PUFF INTO THE LUNGS DAILY, Disp: 60 each, Rfl: 5   glucosamine-chondroitin 500-400 MG  tablet, Take 1 tablet by mouth every morning., Disp: , Rfl:  LUTEIN PO, Take 1 tablet by mouth daily., Disp: , Rfl:    magnesium 30 MG tablet, Take 30 mg by mouth daily in the afternoon., Disp: , Rfl:    Melatonin 10 MG TABS, Take 10 mg by mouth at bedtime., Disp: , Rfl:    OVER THE COUNTER MEDICATION, Take 2 mg by mouth daily in the afternoon. Biotin 2 mg, Disp: , Rfl:    OVER THE COUNTER MEDICATION, Place 1 drop into both eyes daily as needed (dry eye). OTC eye drop, Disp: , Rfl:    PAPAYA PO, Take 3 tablets by mouth daily as needed (heartburn)., Disp: , Rfl:    selenium 50 MCG TABS, Take 50 mcg by mouth daily., Disp: , Rfl:    senna (SENOKOT) 8.6 MG tablet, Take 2 tablets by mouth daily., Disp: , Rfl:    vitamin B-12 (CYANOCOBALAMIN) 1000 MCG tablet, Take 1,000 mcg by mouth daily., Disp: , Rfl:    vitamin E 200 UNIT capsule, Take 200 Units by mouth daily., Disp: , Rfl:    fish oil-omega-3 fatty acids 1000 MG capsule, Take 1,000 mg by mouth daily., Disp: , Rfl:       Objective:   Vitals:   09/22/22 1302  BP: 112/62  Pulse: 74  Temp: 98 F (36.7 C)  TempSrc: Oral  SpO2: 95%  Weight: 145 lb (65.8 kg)  Height: '5\' 5"'$  (1.651 m)    Estimated body mass index is 24.13 kg/m as calculated from the following:   Height as of this encounter: '5\' 5"'$  (1.651 m).   Weight as of this encounter: 145 lb (65.8 kg).  '@WEIGHTCHANGE'$ @  [employer]   09/22/22 1302  Weight: 145 lb (65.8 kg)     Physical ExamGeneral: No distress. Looks well Neuro: Alert and Oriented x 3. GCS 15. Speech normal Psych: Pleasant Resp:  Barrel Chest - no.  Wheeze - no, Crackles - 1/3rd, No overt respiratory distress CVS: Normal heart sounds. Murmurs - no Ext: Stigmata of Connective Tissue Disease - no HEENT: Normal upper airway. PEERL +. No post nasal drip        Assessment:       ICD-10-CM   1. 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 Nodule of lower lobe of right lung  R91.1     5. 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  But ever so slightly since 2015 it is slowly gotten worse.  At conference the concern is that he might have IPF or autoimmune ILD.   Plan -Check autoimmune and serology profile along with basic blood work today 09/22/2022 - Discussed various antifibrotic options including clinical trials  -At this point in time you are leaning towards purtech study that uses modified pirfenidone   -Please meet with the study coordinator Darius Bump Pool to take consent form  -If you do not qualify for the study then we will consider standard of care antifibrotic nintedanib   RLL lung ndoule - dec 2023  - stable x 10  years  Plan  - no  further followup  Chronic cough GERD  -Fish oil could be playing a role in this along butt seems problem did not resolve after stopping fish oil.  Pulmonary fibrosis could also be playing a role.  Plan  -= check RAST allergy profile and cbc with diff - ok to restart fish oil -We will address the cough based on the blood allergy test and other tests (consdier changein breo)  Plan -Return to see Dr. Chase Caller for a 30-minute visit in 2 months but after spirometry and DLCO  -Symptom questionnaire and walking desaturation test at follow-up   -This visit can be canceled if you are already enrolled in research.    Moderate Complexit  The table below is from the 2021 E/M guidelines, first released in 2021, with minor revisions added in 2023. Must meet the requirements for 2 out of 3 dimensions to qualify.    Number and complexity of problems addressed Amount and/or complexity of data reviewed Risk of complications and/or morbidity  One or more chronic illness with mild exacerbation, progression, or side effects of treatment  Two or more stable chronic illnesses  One undiagnosed new problem with uncertain prognosis  One acute illness with systemic symptoms   Acute complicated injury Must meet the requirements for 1 of 3 of the categories)  Category 1: Tests and documents, historian  Any combination of 3 of the following:  Assessment requiring an independent historian  Review of prior external records  Review of results of each unique test  Ordering of each unique test    Category 2: Interpretation of tests  Independent interpretation of a test perfromed by another physician/NPP  Category 3: Discuss management/tests  Discussion of magagement or tests with an external physician/NPP Prescription drug management  Decision regarding minor surgery with identfied patient or procedure risk factors  Decision regarding elective major surgery without  identified patient or procedure risk factors  Diagnosis or treatment significantly limited by social determinants of health             SIGNATURE    Dr. Brand Males, M.D., F.C.C.P,  Pulmonary and Critical Care Medicine Staff Physician, [place] Director - Interstitial Lung Disease  Program  [facility] [place] [facility] [place], [place], [address]  Pager: [phone], If no answer or between  15:00h - 7:00h: call [phone] Telephone: [phone]  1:38 PM 09/22/2022

## 2022-09-22 NOTE — Addendum Note (Signed)
Addended by: Suzzanne Cloud E on: 09/22/2022 02:31 PM   Modules accepted: Orders

## 2022-09-22 NOTE — Addendum Note (Signed)
Addended by: Suzzanne Cloud E on: 09/22/2022 03:07 PM   Modules accepted: Orders

## 2022-09-22 NOTE — Addendum Note (Signed)
Addended by: Suzzanne Cloud E on: 09/22/2022 02:12 PM   Modules accepted: Orders

## 2022-09-22 NOTE — Addendum Note (Signed)
Addended by: Suzzanne Cloud E on: 09/22/2022 03:09 PM   Modules accepted: Orders

## 2022-09-22 NOTE — Addendum Note (Signed)
Addended by: Alvin Critchley on: 09/22/2022 01:42 PM   Modules accepted: Orders

## 2022-09-22 NOTE — Addendum Note (Signed)
Addended by: Suzzanne Cloud E on: 09/22/2022 02:39 PM   Modules accepted: Orders

## 2022-09-22 NOTE — Patient Instructions (Addendum)
ICD-10-CM   1. 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  But ever so slightly since 2015 it is slowly gotten worse.  At conference the concern is that he might have IPF or autoimmune ILD.   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  Pulmonary fibrosis could also be playing a role.  Plan  -= check RAST allergy profile and cbc with diff - ok to restart fish oil -We will address the cough based on the blood allergy test and other tests (consdier changein breo)  Plan -Return to see Dr. Chase Caller for a 30-minute visit in 2 months but after spirometry and DLCO  -Symptom questionnaire and walking desaturation test at follow-up   -This visit can be canceled if you are already enrolled in research.

## 2022-09-22 NOTE — Patient Instructions (Signed)
Spirometry/DLCO performed today.

## 2022-09-22 NOTE — Progress Notes (Signed)
Spirometry/DLCO performed today.

## 2022-09-22 NOTE — Addendum Note (Signed)
Addended by: Alvin Critchley on: 09/22/2022 02:53 PM   Modules accepted: Orders

## 2022-09-22 NOTE — Addendum Note (Signed)
Addended by: Alvin Critchley on: 09/22/2022 01:41 PM   Modules accepted: Orders

## 2022-09-24 LAB — QUANTIFERON-TB GOLD PLUS
Mitogen-NIL: >10 IU/mL
NIL: 0.06 IU/mL
QuantiFERON-TB Gold Plus: NEGATIVE
TB1-NIL: 0.26 IU/mL
TB2-NIL: 0.29 IU/mL

## 2022-09-25 ENCOUNTER — Telehealth: Payer: Self-pay | Admitting: Internal Medicine

## 2022-09-25 LAB — ALDOLASE: Aldolase: 5.8 U/L (ref <=8.1)

## 2022-09-26 LAB — SJOGREN'S SYNDROME ANTIBODS(SSA + SSB)
SSA (Ro) (ENA) Antibody, IgG: <1 AI
SSB (La) (ENA) Antibody, IgG: <1 AI

## 2022-09-26 LAB — RHEUMATOID FACTOR: Rheumatoid fact SerPl-aCnc: <14 IU/mL (ref <14)

## 2022-09-26 LAB — ANTI-SCLERODERMA ANTIBODY: Scleroderma (Scl-70) (ENA) Antibody, IgG: <1 AI

## 2022-09-26 LAB — CYCLIC CITRUL PEPTIDE ANTIBODY, IGG: Cyclic Citrullin Peptide Ab: <16 UNITS

## 2022-09-26 LAB — ANA: Anti Nuclear Antibody (ANA): NEGATIVE

## 2022-09-27 LAB — DRUG SCREEN 13 W/CONF, WB

## 2022-09-27 LAB — DRUG SCREEN 13 W/CONF , SERUM
Amphetamines, IA: NEGATIVE ng/mL
Barbiturates, IA: NEGATIVE ug/mL
Benzodiazepines, IA: NEGATIVE ng/mL
Cocaine & Metabolite, IA: NEGATIVE ng/mL
FENTANYL, IA: NEGATIVE ng/mL
MEPERIDINE, IA: NEGATIVE ng/mL
Methadone, IA: NEGATIVE ng/mL
Opiates, IA: NEGATIVE ng/mL
Oxycodones, IA: NEGATIVE ng/mL
Phencyclidine, IA: NEGATIVE ng/mL
Propoxyphene, IA: NEGATIVE ng/mL
THC(Marijuana) Metabolite, IA: NEGATIVE ng/mL
TRAMADOL, IA: NEGATIVE ng/mL

## 2022-09-27 NOTE — Telephone Encounter (Cosign Needed)
Telephone call to patient to inform her that she does not qualify for the PureTech LYT-100 study as she does not meet the inclusion/exclusion criteria.

## 2022-09-28 LAB — ALLERGEN PROFILE, PERENNIAL ALLERGEN IGE
Alternaria Alternata IgE: <0.1 kU/L
Aspergillus Fumigatus IgE: 0.45 kU/L — AB
Aureobasidi Pullulans IgE: <0.1 kU/L
Candida Albicans IgE: 0.99 kU/L — AB
Cat Dander IgE: <0.1 kU/L
Chicken Feathers IgE: <0.1 kU/L
Cladosporium Herbarum IgE: <0.1 kU/L
Cow Dander IgE: <0.1 kU/L
D Farinae IgE: <0.1 kU/L
D Pteronyssinus IgE: <0.1 kU/L
Dog Dander IgE: <0.1 kU/L
Duck Feathers IgE: <0.1 kU/L
Goose Feathers IgE: <0.1 kU/L
Mouse Urine IgE: <0.1 kU/L
Mucor Racemosus IgE: <0.1 kU/L
Penicillium Chrysogen IgE: <0.1 kU/L
Phoma Betae IgE: <0.1 kU/L
Setomelanomma Rostrat: <0.1 kU/L
Stemphylium Herbarum IgE: <0.1 kU/L

## 2022-09-28 LAB — HYPERSENSITIVITY PNEUMONITIS
A. Pullulans Abs: NEGATIVE
A.Fumigatus #1 Abs: NEGATIVE
Micropolyspora faeni, IgG: NEGATIVE
Pigeon Serum Abs: NEGATIVE
Thermoact. Saccharii: NEGATIVE
Thermoactinomyces vulgaris, IgG: NEGATIVE

## 2022-09-28 LAB — RNP ANTIBODIES: ENA RNP Ab: 0.7 AI (ref 0.0–0.9)

## 2022-10-06 LAB — MYOMARKER 3 PLUS PROFILE (RDL)

## 2022-10-09 ENCOUNTER — Other Ambulatory Visit (HOSPITAL_COMMUNITY): Payer: Self-pay

## 2022-10-09 ENCOUNTER — Telehealth: Payer: Self-pay | Admitting: Internal Medicine

## 2022-10-09 NOTE — Telephone Encounter (Signed)
Provider portion placed in mailbox

## 2022-10-09 NOTE — Telephone Encounter (Signed)
Called and discussed [employer] patient assistance with patient. Emailed application to [employer] on file.

## 2022-10-09 NOTE — Telephone Encounter (Signed)
Rx team: Jeneen Montgomery  Did NOt meet criteria for puretech study. Please start paper work for [facility] - '100mg'$  twice daily. Starting with low dose protocol

## 2022-10-09 NOTE — Telephone Encounter (Signed)
Submitted a Prior Authorization request to [facility] for OFEV '100mg'$  via CoverMyMeds. Will update once we receive a response.  Key: [id number] - PA Case ID: [id number]

## 2022-10-09 NOTE — Telephone Encounter (Signed)
Received notification from [facility] regarding a prior authorization for [place]. Authorization has been APPROVED from 10/09/22 to 09/04/23.   Per test claim, copay for 30 days supply is $3,102.00. Claim puts patient in the coverage gap:   Authorization # (Key: [id number]) - [id number]

## 2022-10-23 NOTE — Telephone Encounter (Signed)
Both pt and provider portions have been received, pt has provided Form 8879 for 2022 (which does list the adjusted gross income value) however this is not one of the forms listed on [employer] Cares application that is accepted as financial documentation. Additionally the SSN's for both the pt and her husband have been crossed out which may also invalidate the documents. Regardless, the listed gross income value exceeds the threshold for a household of 2.  Will need to reach out to the pt to discuss.

## 2022-10-24 ENCOUNTER — Encounter: Payer: Self-pay | Admitting: Internal Medicine

## 2022-10-24 ENCOUNTER — Telehealth: Payer: Self-pay | Admitting: Internal Medicine

## 2022-10-24 NOTE — Telephone Encounter (Signed)
Patient called to inform the nurse or doctor that she is having a flareup of her COPD and would like some meds called into her pharmacy.  She stated that the doctor usually calls in Prednisone and an antibiotic.  She would like it sent to the [facility] in [place].  Please advise and call patient to confirm at [phone]

## 2022-10-25 MED ORDER — AZITHROMYCIN 250 MG PO TABS: ORAL_TABLET | ORAL | 0 refills | Status: DC

## 2022-10-25 MED ORDER — PREDNISONE 20 MG PO TABS: 20.0000 mg | ORAL_TABLET | Freq: Every day | ORAL | 0 refills | Status: DC

## 2022-10-25 NOTE — Telephone Encounter (Signed)
Ok to send abx and prednisone for AECOPD Z pack - take as directed Prednisone 40 mg daily for 5 days. Take in AM with food  Should be using mucinex [phone] mg Twice daily for cough/congestion. Continue scheduled/prn inhalers. Needs OV if no improvement. ED precautions. Thanks.

## 2022-10-25 NOTE — Telephone Encounter (Signed)
Mychart messages sent:  The dark yellow phlegm  began two days ago,  took Copywriter, advertising and Tylenol.   No weezing that I can tell.  What are your suggestions?     You  LADARIA POTT "[id number] minutes ago (9:54 AM)    Erin Li,   When did your symptoms first begin? Along with coughing, are you having any wheezing or any chest discomfort? Any fever? Have you tried any over the counter medicines to see if it might help with your symptoms?    Erin Li "Erin Li"  P [facility] (supporting Brand Males, MD)18 hours ago (4:27 PM)    Called this morning asking for medication for flare up, coughing and bringing up yellowish phlegm! If you think I need medications please contact [facility] in [place].  Thanking you in advance.    Katie, please advise on this for pt if you are okay sending in something to help out with her symptoms.

## 2022-10-25 NOTE — Telephone Encounter (Signed)
Refer to mychart encounter. 

## 2022-11-06 ENCOUNTER — Other Ambulatory Visit (HOSPITAL_COMMUNITY): Payer: Self-pay

## 2022-11-06 NOTE — Telephone Encounter (Signed)
Submitted Patient Assistance Application to [employer] for OFEV along with provider portion, PA and income documents. Will update patient when we receive a response.  Fax# [phone] Phone# [phone] 

## 2022-11-06 NOTE — Telephone Encounter (Signed)
Received fax from [facility] for [facility] patient assistance, patient's application has been DENIED due to exceeding income criteria. BI Cares does not have any appeal process in place and denial is final determination unless patient's household has decrease in income to qualify.    Phone# [phone]   Ran updated test claim today for 30 day supply of $3094.31. I advised that next month the copay would likely be around $300-$500 but unable to quote a price. I reviewed changes to Medicare Part D with 2024 and that her max out-of-pocket for the year would be around $3500.  She states she will need to think about it and get back to Dr. Chase Caller with her decision to start medication  Knox Saliva, PharmD, MPH, BCPS, CPP Clinical Pharmacist (Rheumatology and Pulmonology)

## 2022-11-16 ENCOUNTER — Encounter: Payer: Self-pay | Admitting: Internal Medicine

## 2022-11-28 ENCOUNTER — Encounter: Payer: Self-pay | Admitting: Internal Medicine

## 2022-11-28 MED ORDER — PREDNISONE 10 MG PO TABS: ORAL_TABLET | ORAL | 0 refills | Status: DC

## 2022-11-28 MED ORDER — AMOXICILLIN-POT CLAVULANATE 875-125 MG PO TABS: 1.0000 | ORAL_TABLET | Freq: Two times a day (BID) | ORAL | 0 refills | Status: DC

## 2022-11-28 NOTE — Telephone Encounter (Signed)
I will send in Augmentin 1 tab twice daily x 7 days and prednisone taper. Have her take mucinex 600mg  twice daily and start fluticasone nasal spray if not already using.

## 2022-11-29 ENCOUNTER — Telehealth: Payer: Self-pay | Admitting: Primary Care

## 2022-11-29 NOTE — Telephone Encounter (Signed)
Error

## 2022-12-04 ENCOUNTER — Other Ambulatory Visit: Payer: Self-pay

## 2022-12-04 DIAGNOSIS — J439 Emphysema, unspecified: Secondary | ICD-10-CM

## 2022-12-04 DIAGNOSIS — J449 Chronic obstructive pulmonary disease, unspecified: Secondary | ICD-10-CM

## 2022-12-04 DIAGNOSIS — J849 Interstitial pulmonary disease, unspecified: Secondary | ICD-10-CM

## 2022-12-05 ENCOUNTER — Other Ambulatory Visit: Payer: Self-pay | Admitting: Internal Medicine

## 2022-12-05 ENCOUNTER — Ambulatory Visit (INDEPENDENT_AMBULATORY_CARE_PROVIDER_SITE_OTHER): Payer: Medicare Other | Admitting: Internal Medicine

## 2022-12-05 DIAGNOSIS — J439 Emphysema, unspecified: Secondary | ICD-10-CM

## 2022-12-05 DIAGNOSIS — J849 Interstitial pulmonary disease, unspecified: Secondary | ICD-10-CM | POA: Diagnosis not present

## 2022-12-05 DIAGNOSIS — J8410 Pulmonary fibrosis, unspecified: Secondary | ICD-10-CM | POA: Diagnosis not present

## 2022-12-05 DIAGNOSIS — J449 Chronic obstructive pulmonary disease, unspecified: Secondary | ICD-10-CM | POA: Diagnosis not present

## 2022-12-05 LAB — PULMONARY FUNCTION TEST
DL/VA % pred: 65 %
DL/VA: 2.65 ml/min/mmHg/L
DLCO cor % pred: 57 %
DLCO cor: 11.27 ml/min/mmHg
DLCO unc % pred: 57 %
DLCO unc: 11.27 ml/min/mmHg
FEF 25-75 Pre: 0.71 L/sec
FEF2575-%Pred-Pre: 48 %
FEV1-%Pred-Pre: 75 %
FEV1-Pre: 1.53 L
FEV1FVC-%Pred-Pre: 83 %
FEV6-%Pred-Pre: 91 %
FEV6-Pre: 2.37 L
FEV6FVC-%Pred-Pre: 101 %
FVC-%Pred-Pre: 90 %
FVC-Pre: 2.47 L
Pre FEV1/FVC ratio: 62 %
Pre FEV6/FVC Ratio: 96 %

## 2022-12-05 NOTE — Patient Instructions (Signed)
Spiro/DLCO performed today.  

## 2022-12-05 NOTE — Progress Notes (Signed)
Spiro/DLCO performed today.  

## 2022-12-07 ENCOUNTER — Ambulatory Visit: Payer: Medicare Other | Admitting: Internal Medicine

## 2022-12-07 ENCOUNTER — Encounter: Payer: Self-pay | Admitting: Internal Medicine

## 2022-12-07 VITALS — BP 120/70 | HR 66 | Ht 65.0 in | Wt 146.8 lb

## 2022-12-07 DIAGNOSIS — J849 Interstitial pulmonary disease, unspecified: Secondary | ICD-10-CM | POA: Diagnosis not present

## 2022-12-07 DIAGNOSIS — Z7185 Encounter for immunization safety counseling: Secondary | ICD-10-CM | POA: Diagnosis not present

## 2022-12-07 DIAGNOSIS — J439 Emphysema, unspecified: Secondary | ICD-10-CM

## 2022-12-07 DIAGNOSIS — J84112 Idiopathic pulmonary fibrosis: Secondary | ICD-10-CM | POA: Diagnosis not present

## 2022-12-07 DIAGNOSIS — J8410 Pulmonary fibrosis, unspecified: Secondary | ICD-10-CM

## 2022-12-07 NOTE — Patient Instructions (Addendum)
ICD-10-CM   1. 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  But ever so slightly since 2015 it is slowly gotten worse.  At conference the concern is that this Is IPF  -Did not qualify for clinical trial on account of combined emphysema   - Not interested in standard of care nintedanib because of expensive upfront co-pay  -Function test currently April 2024 stable  Plan -Continue supportive care   RLL lung ndoule - dec 2023  - stable x 10 years  Plan  - no  further followup  Printing  Plan - Recommend RSV vaccine  Plan -Return to see Dr. Chase Caller for a 30-minute visit in 6 months but after spirometry and DLCO  -Symptom questionnaire and walking desaturation test at follow-up

## 2022-12-07 NOTE — Progress Notes (Signed)
89    OV 04/10/2017   Chief Complaint  Patient presents with   Follow-up    Pt states her breathing is unchanged since last OV. Pt states she has mild non prod cough. Pt denies CP/tightness, f/c/s, and chest congestion.    Follow-up COPD but now has concerned that she has developed interstitial lung disease  She is here with her husband. Back in 2015 a high-resolution CT chest because I thought I heard crackles and thoracic radiology ruled out interstitial lung disease. In the interim she's had lung cancer screening CT chest at overall stability in her pulmonary function test. But there was concern because of crackles that she might have interstitial lung disease so we did a high-resolution CT chest and July 2018 and thoracic radiologist reported as definite precedence of ILD although it is not a UIP pattern. [name] She does say that her son and over the lung transplant in [place] 8 years ago at age of 63 because of IPF. She did have autoimmune panel that is only trace positive for ENP . 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 She denies any humidifier or mildew or mold in the house. The CT chest also shows coronary artery calcification. Of note I personally visualized the CT chest.  ACCP ILD questiin  - symptioms: occ cough x 2 years with some night cough and mild phleogn ona nd off. Very mild non troubling dyspnea x 3 years - pasth medical : GERD +, mild dysphagia + - personal exposure x -: +ver for remote use of rec / street drugs. Cig smoked age 82 though 47 at 35 cigs/dat - fam hx: COPD +, son with transplant for ?  IPF at age 70 - home exposurE: does have hot tube - uses it once a month. Has a Macaw bird x > 20 years - residence: [place], [place], [place], [place], [place] - occupatin: bookeeper - pulmotiox hx: negagive  Results for ABRYANA, KORZENIEWSKI (MRN [id number]) as  of 04/10/2017 11:21  Ref. Range 04/03/2017 10:18  CK Total Latest Ref Range: 7 - 177 U/L 63  CK-MB Latest Ref Range: 0.3 - 4.0 ng/mL 2.4  Aldolase Latest Ref Range: <=8.1 U/L 3.5  Sed Rate Latest Ref Range: 0 - 30 mm/hr 20  Anit Nuclear Antibody(ANA) Latest Ref Range: NEGATIVE  NEG  Angiotensin-Converting Enzyme Latest Ref Range: 9 - 67 U/L 67  Cyclic Citrullin Peptide Ab Latest Units: Units <16  ds DNA Ab Latest Units: IU/mL 1  ENA RNP Ab Latest Ref Range: 0.0 - 0.9 AI 1.4 (H)  RA Latex Turbid. Latest Ref Range: <14 IU/mL <14  SSA (Ro) (ENA) Antibody, IgG Latest Ref Range: <1.0 NEG AI  <1.0 NEG  SSB (La) (ENA) Antibody, IgG Latest Ref Range: <1.0 NEG AI  <1.0 NEG  Scleroderma (Scl-70) (ENA) Antibody, IgG Latest Ref Range: <1.0 NEG AI  <1.0 NEG    OV 09/28/2017  Chief Complaint  Patient presents with   Follow-up    PFT done today. States she has been doing good and has no complaints of cough, SOB, or CP.    Fu mixed empyhsema and pulmonary fibrosis  For  years of only known who is a COPD patient with a family history of pulmonary fibrosis.  But it appears that in 2018 she started developing signs of pulmonary fibrosis with  crackles and findings of indeterminate UIP on CT chest July 2018.  She has family history of pulmonary fibrosis and that her son had lung transplant for the same.  She now presents for follow-up.  In the interim she started feeling better.  She is less dyspneic.  She continues with the Brio.  There are no new issues.  Pulmonary function test done as documented below shows an improvement.  Therefore instead of biopsy she feels keeping an eye on this is a better approach.    Walking desaturation test on 09/28/2017 185 feet x 3 laps on ROOM AIR:  did NOT desaturate. Rest pulse ox was 99%, final pulse ox was96 %. HR response 78 /min at rest to 105/min at peak exertion. [name]  Did nto Desaturate < 88% . Jayln Mccleery yes  Desaturated </= 3% points.  [name] yes did get tachyardic   OV 01/01/2018  Chief Complaint  Patient presents with   Follow-up    PFT done today.  Pt states she has been doing good since last visit. Had a cold mid January 2019 but has been better since. Denies any complaints of cough, SOB, or CP.   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 She continues on Brio. Since last visit January 2019 she continues to feel better. COPD cat score is very minimal as documented below. She had pulmonary function test today and this shows stability in her FVC after initial improvement. Her DLCO is even better. She is on observation course regarding her ILD given the improvement in her pulmonary function test. Her last CT scan of the chest was July 2018        OV 07/09/2018  Subjective:  Patient ID: Erin Li, female , DOB: December 05, 1940 , age 82 y.o. , MRN: [id number] , ADDRESS: [place] [address]   07/09/2018 -   Chief Complaint  Patient presents with   Consult    Alot of coughing producing mucus no color at this time.     HPI Erin Li 82 y.o. 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  She tells me that for the last 3 weeks has had increased cough with congestion and white sputum more than baseline but activities of daily living and shortness of breath or guarding is all unchanged and she has good effort tolerance.  There are no new other issues.  In terms of ILD: Last visit I thought I will be in improved based on improvement in pulmonary function test but actually that might of been a red herring because today's pulmonary function test shows a decline which is in line with previous pulmonary function test showing stability.  She certainly feels stable.  In  July 2019 she had a low-dose CT scan for lung cancer and this ruled out lung cancer on screening.  They were able to report some mildly changes at the base.  She is not interested in surgical lung biopsy because her son who had pulmonary fibrosis finally died from 47 years of transplant from pulmonary embolism, stroke and acute renal failure and chronic critical illness in the rehab facility.      OV 10/08/2019  Subjective:  Patient ID: Erin Li, female , DOB: 07/05/1941 , age 20  y.o. , MRN: [id number] , ADDRESS: [place] [address]   10/08/2019 -   Chief Complaint  Patient presents with   Follow-up    Pt states she has been doing well since last visit and states her breathing has been doing well. Pt will have an occ cough.   predominantly COPD with some amount of basal pulmonary fibrosis  N(atlernate to UIP)  in the setting of her son having pulmonary fibrosis alt- decieasedd 2019    HPI Nhung Riebe 82 y.o. 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  Last seen in #2019.  She presents with her husband.  Since the onset of the COVID-19 pandemic there have been in the house and socially isolated.  She has been doing well without any problems overall stable.  She not exercising as much as she used to.  She not taking any inhalers for her COPD.  She only takes Mucinex.  Therefore her cough symptom with mucus is slightly worse than before.  She had high-resolution CT scan of the chest which I reviewed the report and agree with the findings and I interpreted as well with visualization: She has emphysema associated with alternate diagnosis pattern.  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  She still grieving from her son who died in 06/04/18 from pulmonary fibrosis.    OV 06/01/2020   Subjective:  Patient ID: Erin Li, female , DOB: 03-04-1941, age 74 y.o. years. , MRN: [id number],  ADDRESS: [place]  [address] PCP  [name], MD Providers : Treatment Team:  Attending Provider: Brand Males, MD   Chief Complaint  Patient presents with   Follow-up    ILD, no concerns    predominantly COPD with some amount of basal pulmonary fibrosis  N(atlernate to UIP)  in the setting of her son having pulmonary fibrosis alt- decieasedd 2019   HPI Bryttney Tschida 82 y.o. -presents for follow-up.  Last seen February 2021.  Since then she is doing stable.  She continues to be stable.  Her symptom scores are very minimal and shows stability.  She had pulmonary function test August 2021 and compared to November 2019 she is stable.  Her walking desaturation test is also stable.  Her last CT scan was January 2021.  She prefers to have monitoring for pulmonary fibrosis because of her son's issue.  She likes the strategy of alternating pulmonary function test and also CT scan of the chest.      OV 12/06/2021  Subjective:  Patient ID: Erin Li, female , DOB: [birth year] , age 49 y.o. , MRN: [id number] , ADDRESS: [place] [address] PCP [name], MD Patient Care Team: [name], MD as PCP - General (Internal Medicine)  This Provider for this visit: Treatment Team:  Attending Provider: Brand Males, MD   Follow-up emphysema with some amount of ILD.  Last seen in 06-04-2020.  It is now 18 months since I last saw her.  Doing well overall.  Last month she did have a flareup requiring antibiotics and prednisone for COPD exacerbation.  She continues on [place].  No new health issues.  April 23, 2022 she plans to go to [place].  She is wanting antibiotic and prednisone.  Her last pulmonary function test was a year and a half ago.  Her last CT scan of the chest was a year ago or more.  She is agreeable to have follow-up reassessment done.  Symptom score is as below and stable.  Walking desaturation test shows a tendency to desaturate but still acceptable.    OV  05/18/2022   Subjective:  Patient ID: Erin Li, female , DOB: 06/04/1941, age 82 y.o. years. , MRN: [id number],  ADDRESS: [place] [address] PCP  Janie Morning, DO Providers : Treatment Team:  Attending Provider: Brand Males, MD Patient Care Team: Janie Morning, DO as PCP - General (Family Medicine)    Chief Complaint  Patient presents with   Follow-up    PFT performed today.  Pt states she has been doing okay since last visit. States for the past 2 weeks she has been coughing a lot getting up white phlegm and states cough is worse at night.    Follow-up emphysema with some amount of ILD   HPI Mikeala S Brimage 54 y.o. -[son also has ILD].  Returns for follow-up.  In August 2023 she went to [place] with her female side of the family.  She return May 02, 2022.  She had a great time.  No issues walking around the.  Then after coming back for the last 2 weeks she has respiratory symptoms particularly increased cough for the last 2 to 3 weeks associated with white sputum occasionally yellow.  Definitely worse than baseline.  Moderate in severity and but no fever.  No chills no worsening shortness of breath.  No wheezing.  No edema no orthopnea no chills.  However pulmonary function test today shows a decline [see below].  Despite this increase in symptoms her subjective symptom score below is stable.  Of note her husband is here with her today.    PFT   Narrative & Impression  CLINICAL DATA:  Interstitial lung disease   EXAM: CT CHEST WITHOUT CONTRAST   TECHNIQUE: Multidetector CT imaging of the chest was performed following the standard protocol without intravenous contrast. High resolution imaging of the lungs, as well as inspiratory and expiratory imaging, was performed.   RADIATION DOSE REDUCTION: This exam was performed according to the departmental dose-optimization program which includes automated exposure control, adjustment of the  mA and/or kV according to patient size and/or use of iterative reconstruction technique.   COMPARISON:  10/01/2020, 09/23/2019, 09/27/2018   FINDINGS: Cardiovascular: Aortic atherosclerosis. Normal heart size. Left and right coronary artery calcifications. No pericardial effusion.   Mediastinum/Nodes: No enlarged mediastinal, hilar, or axillary lymph nodes. Thyroid gland, trachea, and esophagus demonstrate no significant findings.   Lungs/Pleura: Moderate centrilobular and paraseptal emphysema. Unchanged mild pulmonary fibrosis in a pattern with apical to basal gradient, featuring irregular peripheral interstitial opacity, septal thickening, and ground-glass, mild, tubular bronchiectasis, with small areas of subpleural bronchiolectasis at the lung bases. No significant air trapping on expiratory phase imaging. Multiple small bilateral pulmonary nodules are stable and benign, largest a 0.9 cm nodule of the superior segment right lower lobe (series 4, image 57). No pleural effusion or pneumothorax.   Upper Abdomen: No acute abnormality.   Musculoskeletal: No chest wall abnormality. No suspicious osseous lesions identified.   IMPRESSION: 1. Unchanged mild pulmonary fibrosis in a pattern with apical to basal gradient, featuring irregular peripheral interstitial opacity, septal thickening, and ground-glass, with small areas of subpleural bronchiolectasis at the lung bases. Fibrotic findings are not significantly changed compared to immediate prior examination, although as previously reported are worsened over a longer period of time dating back to at least 2015. Findings are categorized as probable UIP per consensus guidelines: Diagnosis of Idiopathic  Pulmonary Fibrosis: An Official ATS/ERS/JRS/ALAT Clinical Practice Guideline. [place], Iss 5, [phone], May 05 2017. 2. Multiple small bilateral pulmonary nodules are stable and definitively benign. 3.  Emphysema. 4. Coronary artery disease.   Aortic Atherosclerosis (ICD10-I70.0) and Emphysema (ICD10-J43.9).     Electronically Signed   By: Delanna Ahmadi M.D.   On: 02/25/2022 17:51    No results found.     OV 08/01/2022  Subjective:  Patient ID: Erin Li, female , DOB: [birth year] , age 24 y.o. , MRN: [id number] , ADDRESS: [place] [address] PCP Janie Morning, DO Patient Care Team: Janie Morning, DO as PCP - General (Family Medicine)  This Provider for this visit: Treatment Team:  Attending Provider: Brand Males, MD  Follow-up emphysema with some amount of ILD  08/01/2022 -   Chief Complaint  Patient presents with   Follow-up     HPI Mckynzie NASHLEY MAUNU 82 y.o. -presents with her husband.  After the recent flareup she is better.  However she ended up with back pain and sciatica 6 weeks ago.  She got prednisone course but it did not help.  Since then she has not acupuncture with massage therapy and she is significantly better although she still requiring a cane.  Therefore we did not walk her today.  She had a repeat pulmonary function test that shows slight improvement in FVC but the DLCO is still the same.  Her FVC over the last 5 to 8 years is range bound but the DLCO seems to taken a turn for the worse compared to several years ago.  Still the decline is not greater than 5 to 10% in the last 1 or 2 years to meet progressive phenotype definition.  I did visualize the CT scan currently with and also compared it to the old one.  There is definitely more ILD findings now compared to 2015 but is reported she does not meet the classic definition of progressive phenotype.  The pet bird she had is now deceased for the last 5 years.  Her son himself had IPF/ILDand had lung transplant several years ago.  She is willing to see genetics counselor.  Her husband prefers an aggressive approach with potential antifibrotic's because of the slow progression in  the future course being uncertain.  She prefers a more wait and watch approach.  We took a shared decision making to discuss in case conference repeat pulmonary function test and then decide    Husband is particularly worried about chronic cough.  Is particular worse in the night.  Is been going on for a long time but worse in the last 6 months definitely much worse at night.  She taking fish oil for many years but recently she has also been having heartburn.  There is no wheezing.  He is worried if it could be related to ILD.  Xxxxx[id number]  DEC 2023 Brief History: "MR patient for > 18 years. Followed as gold stage 2 copd. but had bird that died 5 years ago. Son had transplant for IPF in [place] 10-15 years ago. She even was in copd trial.  Ratios always obstrucive with fev1 1.4L/67% and current DLCO 10.  Several years ago heard crackles in lung base but CT always read as no ILD and just atelectasis. Now CT says ILD and worse. FVC in 2018 - 2.2-2.38L -> now 2.44 DLC in 2018- 11.5-12.5 -> now 10.8 Does not meet Progr Phenotype definition  for ILD (> 5-10% decline in 1-2 years) Questions:  Does she have ILD  Is this UIP  Would you biopsy OR Rx OR watch?"  iscussion synopsis:  2018 -> june 2023: Ches CT bibasalar ILD (TB, GGO, subplerua reticualtion, No HC. No air trapping). Per [facility] - not progressing.  in retrospect even in 2018 ILD +.. In 2015: ILA. Definite progression since 2015.  NEW RLL NODULAR OPACITY - June 2023 reported in conference but not in [place] report - What is the final conclusion per 2018 ATS/Fleischner Criteria - PROBABLE UIP  - Concordance with official report: DISCORDANT - Conference has picked up a new RLL NODULE  Pathology discussion of biopsy NO:    MDD Impression/Recs: 1) nodule - consier PET/ repeat CT 2) Prob UIP (fam hx,  emphysema, age > 65,s eroplogy) = IPF -> Rx with antifibotic. 3) emphysema + -> currently ILD > emphysema but barely      IMPRESSION: Dominant nodule in the superior segment of the right lower lobe has been present since 2014 consistent with a benign process. Other nodules also demonstrated long-term presence consistent with benign process.   Underlying emphysematous lung changes. Interstitial process also seen as on the prior high-resolution chest CT scan.     Electronically Signed   By: Jill Side M.D.   On: 09/05/2022 18:24 OV 09/22/2022  Subjective:  Patient ID: Erin Li, female , DOB: [birth year] , age 54 y.o. , MRN: [id number] , ADDRESS: [place] [address] PCP Janie Morning, DO Patient Care Team: Janie Morning, DO as PCP - General (Family Medicine)  This Provider for this visit: Treatment Team:  Attending Provider: Brand Males, MD    09/22/2022 -   Chief Complaint  Patient presents with   Follow-up    Pft review,      HPI Aariya JAHIRA GALER 82 y.o. -returns for follow-up.  At this visit she is here with her husband.  He is acting as an independent historian.  She tells me that overall she is stable but she is having chronic cough.  She is quite bothered by the chronic cough.  She tells me that over the last year or 2 it slowly gotten worse but since last visit it is stable.  Nighttime cough is worse than daytime.  It does wake her husband up.  She does bring "junk".  It is clear but sometimes slightly yellow-tinged.  There is no wheezing.  Happening despite Breo.  Happening despite stopping fish oil.  Her shortness of breath is stable.  We discussed several things today  -Chronic cough: Okay to restart fish oil but will also check blood allergy panel and CBC with differential -COPD: Is stable.  She will continue inhaler therapy  -Right lower lobe lung nodule: This was parted during the case conference.  We did a super D CT chest and the current radiologist has said it has been stable for 10 years.  No further follow-up  -Pulmonary fibrosis: The  concern at the conference is that she has IPF based on probable UIP on the CT chest, slow emergence and progression and also son having pulmonary fibrosis IPF.  Last serologies showed trace ANA positive but that was 10 years ago.  She is agreeable for repeat.  We discussed therapeutic options including standard of care nintedanib.  She said that many years ago she did have a cardiac cath and she did have a blockage but no stent was placed or angioplasty was done.  We also discussed standard of care pirfenidone and the side effects.  We then discussed a modified pirfenidone through a clinical trial [facility].  The side effect profile here is less but for the first 6 months she could get placebo standard of care pirfenidone or the study drug.  Then after 6 months should be randomized to different doses of the study drug which research is on show for his one third less side effect profile than standard of care pirfenidone.  She is more interested in this option.[name]  However we do need to get serologies.       OV 12/07/2022  Subjective:  Patient ID: Erin Li, female , DOB: 12-Aug-1941 , age 63 y.o. , MRN: [id number] , ADDRESS: [place] [address] PCP Janie Morning, DO Patient Care Team: Janie Morning, DO as PCP - General (Family Medicine)  This Provider for this visit: Treatment Team:  Attending Provider: Brand Males, MD    12/07/2022 -   Chief Complaint  Patient presents with   Follow-up    F/up PFT   #Pulmonary emphysema #IPF -indolent and slowly increased.  Son with interstitial lung disease and status post transplant  HPI DARL FULLERTON 82 y.o. -returns for follow-up.  At last visit after multidisciplinary case conference gave her a definite of IPF diagnosis.  This is slowly crept in.  Should try to join a clinical trial but she did not qualify because of the combined emphysema.  Then we applied for nintedanib but she was told in February 2024 that her co-pay  was over $3000.  I later found out and explained to her today is because of the new inflation reduction act and the donut hole issues around co-pay before they reach catastrophic coverage.  She and her husband process the information but they do not believe they have the capital resources to spend $3000 on co-pay.  So she is opting to stay on supportive care for her ILD.  She feels stable.  In fact her pulmonary function test is slightly better/stable compared to the recent several months.  She is reassured by this.  Last summer she traveled to [place] but this summer they are not going to travel anywhere.  She has not had RSV vaccine.  I advocated for this.  She says she will have it.  Of note her walking desaturation test today was stable.  For her emphysema she continues on Breo     SYMPTOM SCALE -  06/01/2020  12/06/2021  05/18/2022 Copd exac  08/01/2022   O2 use ra ra0 ra ra  Shortness of Breath 0 -> 5 scale with 5 being worst (score 6 If unable to do) 0    At rest 0 0 0 0  Simple tasks - showers, clothes change, eating, shaving 0 00 0 0  Household (dishes, doing bed, laundry) 0  0 0  Shopping 0 0 0 0  Walking level at own pace 0 0 0 0  Walking up Stairs 2 2 2 3   Total (30-36) Dyspnea Score 2 2 2 3   How bad is your cough? 2 3 3  0  How bad is your fatigue 0 0 0 00  How bad is nausea 00 0 0 0  How bad is vomiting?  0 0 0 0  How bad is diarrhea? 0 0 0 0  How bad is anxiety? 0 0 0 0  How bad is depression 0 0 0 0     Simple office  walk 185 feet x  3 laps goal with forehead probe 12/06/2021  12/07/2022   O2 used ra ra  Number laps completed 3 3  Comments about pace avg   Resting Pulse Ox/HR 97% and 91/min 100% and HR 72  Final Pulse Ox/HR 93% and 112/min 94% and HR 101  Desaturated </= 88% no no  Desaturated <= 3% points yes Yes, 6  Got Tachycardic >/= 90/min yes yes  Symptoms at end of test No complaints N compaints  Miscellaneous comments x      PFT     Latest Ref Rng &  Units 12/04/2022    8:48 AM 09/22/2022   11:43 AM 08/01/2022    9:17 AM 05/18/2022    3:08 PM 04/06/2020   10:48 AM 07/09/2018   12:49 PM 01/01/2018    8:51 AM  PFT Results  FVC-Pre L 2.47  P 2.40  2.44  P 2.17  2.38  2.33  2.38   FVC-Predicted Pre % 90  P 87  87  P 78  83  79  81   Pre FEV1/FVC % % 62  P 59  57  P 65  65  59  68   FEV1-Pre L 1.53  P 1.41  1.40  P 1.41  1.55  1.38  1.63   FEV1-Predicted Pre % 75  P 69  67  P 67  72  62  74   DLCO uncorrected ml/min/mmHg 11.27  P 10.61  10.86  P 10.58  13.11  12.18  20.54   DLCO UNC% % 57  P 54  55  P 54  66  47  80   DLCO corrected ml/min/mmHg 11.27  P 10.61  10.86  P 10.58  13.11     DLCO COR %Predicted % 57  P 54  55  P 54  66     DLVA Predicted % 65  P 66  64  P 63  73  57  59     P Preliminary result     Latest Reference Range & Units 09/22/22 14:13 09/22/22 14:38  Class Description Allergens  Comment   D Pteronyssinus IgE Class 0 kU/L <0.10   D Farinae IgE Class 0 kU/L <0.10   Cat Dander IgE Class 0 kU/L <0.10   Dog Dander IgE Class 0 kU/L <0.10   Penicillium Chrysogen IgE Class 0 kU/L <0.10   Cladosporium Herbarum IgE Class 0 kU/L <0.10   Aspergillus Fumigatus IgE Class I kU/L 0.45 !   Mucor Racemosus IgE Class 0 kU/L <0.10   Alternaria Alternata IgE Class 0 kU/L <0.10   Stemphylium Herbarum IgE Class 0 kU/L <0.10   Goose Feathers IgE Class 0 kU/L <0.10   Chicken Feathers IgE Class 0 kU/L <0.10   Duck Feathers IgE Class 0 kU/L <0.10   Mouse Urine IgE Class 0 kU/L <0.10   Anti Nuclear Antibody (ANA) NEGATIVE  NEGATIVE   Cyclic Citrullin Peptide Ab UNITS <16   ENA RNP Ab 0.0 - 0.9 AI 0.7   RA Latex Turbid. <14 IU/mL <14   Anti-Jo-1 Ab (RDL) <20 Units  <20  Anti-PL-7 Ab (RDL) Negative   Negative  Anti-PL-12 Ab (RDL) Negative   Negative  Anti-EJ Ab (RDL) Negative   Negative  Anti-OJ Ab (RDL) Negative   Negative  Anti-SRP Ab (RDL) Negative   Negative  Anti-Mi-2 Ab (RDL) Negative   Negative  Anti-TIF-1gamma Ab (RDL) <20  Units  <20  Anti-MDA-5 Ab (CADM-140)(RDL) <  20 Units  <20  Anti-NXP-2 (P140) Ab (RDL) <20 Units  <20  Anti-SAE1 Ab, IgG (RDL) <20 Units  <20  Anti-PM/Scl-100 Ab (RDL) <20 Units  <20  Anti-Ku Ab (RDL) Negative   Negative  Anti-SS-A 52kD Ab, IgG (RDL) <20 Units  <20  Anti-U1 RNP Ab (RDL) <20 Units  <20  Anti-U2 RNP Ab (RDL) Negative   Negative  Anti-U3 RNP (Fibrillarin)(RDL) Negative   Negative  SSA (Ro) (ENA) Antibody, IgG <1.0 NEG AI <1.0 NEG   SSB (La) (ENA) Antibody, IgG <1.0 NEG AI <1.0 NEG   Scleroderma (Scl-70) (ENA) Antibody, IgG <1.0 NEG AI <1.0 NEG   !: Data is abnormal   has a past medical history of Coronary artery disease, Diverticulosis, HTN (hypertension), and Lung nodule.   reports that she quit smoking about 11 years ago. Her smoking use included cigarettes. She has a 37.50 pack-year smoking history. She has never used smokeless tobacco.  Past Surgical History:  Procedure Laterality Date   CARDIAC CATHETERIZATION     CORONARY ANGIOPLASTY     INTRAMEDULLARY (IM) NAIL INTERTROCHANTERIC Right 02/26/2022   Procedure: INTRAMEDULLARY (IM) NAIL INTERTROCHANTRIC;  Surgeon: Hiram Gash, MD;  Location: [facility];  Service: Orthopedics;  Laterality: Right;   TONSILLECTOMY AND ADENOIDECTOMY      No Known Allergies  Immunization History  Administered Date(s) Administered   Fluad Quad(high Dose 65+) 06/20/2022   Influenza Split 06/04/2013, 05/05/2014   Influenza Whole 05/28/2012   Influenza, High Dose Seasonal PF 06/05/2016, 06/04/2017, 05/20/2018, 05/06/2019   Influenza, Quadrivalent, Recombinant, Inj, Pf 05/25/2021   Influenza,inj,Quad PF,6+ Mos 06/05/2015   Moderna Sars-Covid-2 Vaccination 11/10/2019, 12/08/2019, 06/30/2020, 01/11/2021, 08/02/2021   PNEUMOCOCCAL CONJUGATE-20 11/17/2020   Pneumococcal Conjugate-13 01/26/2015   Pneumococcal Polysaccharide-23 09/04/2005, 09/24/2012   Tdap 03/20/2012    Family History  Problem Relation Age of Onset   Heart attack Father     Kidney disease Father    COPD Mother    Lung cancer Brother 51   Pulmonary fibrosis Son        had lung transplant     Current Outpatient Medications:    albuterol (VENTOLIN HFA) 108 (90 Base) MCG/ACT inhaler, Inhale 2 puffs into the lungs every 6 (six) hours as needed for wheezing or shortness of breath., Disp: 8 g, Rfl: 2   amoxicillin-clavulanate (AUGMENTIN) 875-125 MG tablet, Take 1 tablet by mouth 2 (two) times daily., Disp: 14 tablet, Rfl: 0   Ascorbic Acid (VITAMIN C) 1000 MG tablet, Take 1,000 mg by mouth daily., Disp: , Rfl:    b complex vitamins tablet, Take 2 tablets by mouth daily., Disp: , Rfl:    calcium-vitamin D 250-100 MG-UNIT per tablet, Take 1 tablet by mouth daily. , Disp: , Rfl:    diltiazem (CARDIZEM) 60 MG tablet, Take 1 tablet (60 mg total) by mouth 3 (three) times daily., Disp: , Rfl:    diphenhydrAMINE (BENADRYL) 25 MG tablet, Take 50 mg by mouth at bedtime., Disp: , Rfl:    docusate sodium (COLACE) 100 MG capsule, Take 1 capsule (100 mg total) by mouth 2 (two) times daily., Disp: 10 capsule, Rfl: 0   fish oil-omega-3 fatty acids 1000 MG capsule, Take 1,000 mg by mouth daily., Disp: , Rfl:    fluticasone furoate-vilanterol (BREO ELLIPTA) 100-25 MCG/ACT AEPB, inhale ONE PUFF into THE lungs EVERY DAY, Disp: 60 each, Rfl: 3   glucosamine-chondroitin 500-400 MG tablet, Take 1 tablet by mouth every morning., Disp: , Rfl:    LUTEIN PO, Take  1 tablet by mouth daily., Disp: , Rfl:    magnesium 30 MG tablet, Take 30 mg by mouth daily in the afternoon., Disp: , Rfl:    Melatonin 10 MG TABS, Take 10 mg by mouth at bedtime., Disp: , Rfl:    OVER THE COUNTER MEDICATION, Take 2 mg by mouth daily in the afternoon. Biotin 2 mg, Disp: , Rfl:    OVER THE COUNTER MEDICATION, Place 1 drop into both eyes daily as needed (dry eye). OTC eye drop, Disp: , Rfl:    PAPAYA PO, Take 3 tablets by mouth daily as needed (heartburn)., Disp: , Rfl:    predniSONE (DELTASONE) 10 MG tablet, 4 tabs  for 2 days, then 3 tabs for 2 days, 2 tabs for 2 days, then 1 tab for 2 days, then stop, Disp: 20 tablet, Rfl: 0   predniSONE (DELTASONE) 20 MG tablet, Take 1 tablet (20 mg total) by mouth daily with breakfast., Disp: 5 tablet, Rfl: 0   selenium 50 MCG TABS, Take 50 mcg by mouth daily., Disp: , Rfl:    senna (SENOKOT) 8.6 MG tablet, Take 2 tablets by mouth daily., Disp: , Rfl:    vitamin B-12 (CYANOCOBALAMIN) 1000 MCG tablet, Take 1,000 mcg by mouth daily., Disp: , Rfl:    vitamin E 200 UNIT capsule, Take 200 Units by mouth daily., Disp: , Rfl:    atorvastatin (LIPITOR) 10 MG tablet, TAKE 1 TABLET BY MOUTH  EVERY EVENING AFTER DINNER (Patient not taking: Reported on 12/07/2022), Disp: 30 tablet, Rfl: 1      Objective:   Vitals:   12/07/22 1441  BP: 120/70  Pulse: 66  SpO2: 100%  Weight: 146 lb 12.8 oz (66.6 kg)  Height: 5\' 5"  (1.651 m)    Estimated body mass index is 24.43 kg/m as calculated from the following:   Height as of this encounter: 5\' 5"  (1.651 m).   Weight as of this encounter: 146 lb 12.8 oz (66.6 kg).  @WEIGHTCHANGE @  [employer]   12/07/22 1441  Weight: 146 lb 12.8 oz (66.6 kg)     Physical Exam  General: No distress. thin Neuro: Alert and Oriented x 3. GCS 15. Speech normal Psych: Pleasant Resp:  Barrel Chest - no.  Wheeze - no, Crackles - mild base, No overt respiratory distress CVS: Normal heart sounds. Murmurs - no Ext: Stigmata of Connective Tissue Disease - no HEENT: Normal upper airway. PEERL +. No post nasal drip        Assessment:       ICD-10-CM   1. 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  But ever so slightly since 2015 it is slowly gotten worse.  At conference the concern is that this Is IPF  -Did not qualify for clinical trial on account of combined emphysema   - Not interested in standard of care nintedanib because of expensive upfront co-pay  -Function test currently April 2024 stable  Plan -Continue supportive care   RLL lung ndoule - dec 2023  - stable x 10 years  Plan  - no  further followup  Printing  Plan - Recommend RSV vaccine  Plan -Return to see Dr. Chase Caller for a 30-minute visit in 6 months but after spirometry and DLCO  -Symptom questionnaire and walking desaturation test at follow-up     SIGNATURE    Dr. Brand Males, M.D., F.C.C.P,  Pulmonary and Critical Care Medicine Staff Physician, [place] Director - Interstitial Lung Disease  Program  [facility] [place], [place], [address]  Pager: [phone], If no answer or between  15:00h - 7:00h: call [phone] Telephone: [phone]  6:25 PM 12/07/2022

## 2022-12-12 DIAGNOSIS — E559 Vitamin D deficiency, unspecified: Secondary | ICD-10-CM | POA: Diagnosis not present

## 2022-12-12 DIAGNOSIS — I10 Essential (primary) hypertension: Secondary | ICD-10-CM | POA: Diagnosis not present

## 2022-12-12 DIAGNOSIS — I2510 Atherosclerotic heart disease of native coronary artery without angina pectoris: Secondary | ICD-10-CM | POA: Diagnosis not present

## 2022-12-12 DIAGNOSIS — M810 Age-related osteoporosis without current pathological fracture: Secondary | ICD-10-CM | POA: Diagnosis not present

## 2022-12-12 DIAGNOSIS — E7800 Pure hypercholesterolemia, unspecified: Secondary | ICD-10-CM | POA: Diagnosis not present

## 2022-12-19 DIAGNOSIS — I2510 Atherosclerotic heart disease of native coronary artery without angina pectoris: Secondary | ICD-10-CM | POA: Diagnosis not present

## 2022-12-19 DIAGNOSIS — M810 Age-related osteoporosis without current pathological fracture: Secondary | ICD-10-CM | POA: Diagnosis not present

## 2022-12-19 DIAGNOSIS — T466X5A Adverse effect of antihyperlipidemic and antiarteriosclerotic drugs, initial encounter: Secondary | ICD-10-CM | POA: Diagnosis not present

## 2022-12-19 DIAGNOSIS — E7800 Pure hypercholesterolemia, unspecified: Secondary | ICD-10-CM | POA: Diagnosis not present

## 2022-12-19 DIAGNOSIS — J449 Chronic obstructive pulmonary disease, unspecified: Secondary | ICD-10-CM | POA: Diagnosis not present

## 2022-12-19 DIAGNOSIS — I10 Essential (primary) hypertension: Secondary | ICD-10-CM | POA: Diagnosis not present

## 2022-12-19 DIAGNOSIS — G720 Drug-induced myopathy: Secondary | ICD-10-CM | POA: Diagnosis not present

## 2022-12-19 DIAGNOSIS — E559 Vitamin D deficiency, unspecified: Secondary | ICD-10-CM | POA: Diagnosis not present

## 2022-12-19 DIAGNOSIS — Z0000 Encounter for general adult medical examination without abnormal findings: Secondary | ICD-10-CM | POA: Diagnosis not present

## 2023-02-11 ENCOUNTER — Encounter (HOSPITAL_BASED_OUTPATIENT_CLINIC_OR_DEPARTMENT_OTHER): Payer: Self-pay

## 2023-02-11 ENCOUNTER — Emergency Department (HOSPITAL_BASED_OUTPATIENT_CLINIC_OR_DEPARTMENT_OTHER): Payer: Medicare Other | Admitting: Radiology

## 2023-02-11 ENCOUNTER — Emergency Department (HOSPITAL_BASED_OUTPATIENT_CLINIC_OR_DEPARTMENT_OTHER)
Admission: EM | Admit: 2023-02-11 | Discharge: 2023-02-11 | Disposition: A | Discharge status: Home / Self Care | Payer: Medicare Other | Attending: Emergency Medicine | Admitting: Emergency Medicine

## 2023-02-11 DIAGNOSIS — S92314A Nondisplaced fracture of first metatarsal bone, right foot, initial encounter for closed fracture: Secondary | ICD-10-CM | POA: Diagnosis not present

## 2023-02-11 DIAGNOSIS — M79671 Pain in right foot: Secondary | ICD-10-CM | POA: Diagnosis present

## 2023-02-11 DIAGNOSIS — M79674 Pain in right toe(s): Secondary | ICD-10-CM | POA: Diagnosis not present

## 2023-02-11 DIAGNOSIS — W228XXA Striking against or struck by other objects, initial encounter: Secondary | ICD-10-CM | POA: Diagnosis not present

## 2023-02-11 MED ORDER — OXYCODONE HCL 5 MG PO TABS
2.5000 mg | ORAL_TABLET | Freq: Once | ORAL | Status: AC
  Administered 2023-02-11: 2.5 mg via ORAL
  Filled 2023-02-11: qty 1

## 2023-02-11 MED ORDER — OXYCODONE HCL 5 MG PO TABS: 2.5000 mg | ORAL_TABLET | Freq: Four times a day (QID) | ORAL | 0 refills | Status: DC | PRN

## 2023-02-11 NOTE — Discharge Instructions (Signed)
Please read and follow all provided instructions.  Your diagnoses today include:  1. Closed nondisplaced fracture of first metatarsal bone of right foot, initial encounter     Tests performed today include: An x-ray of the affected area - shows a small fracture of the first metatarsal Vital signs. See below for your results today.   Medications prescribed:  Oxycodone - narcotic pain medication  DO NOT drive or perform any activities that require you to be awake and alert because this medicine can make you drowsy.   Use pain medication only under direct supervision at the lowest possible dose needed to control your pain.   Take any prescribed medications only as directed.  Home care instructions:  Follow any educational materials contained in this packet Follow R.I.C.E. Protocol: R - rest your injury  I  - use ice on injury without applying directly to skin C - compress injury with bandage or splint E - elevate the injury as much as possible  Follow-up instructions: Please follow-up with your podiatrist in 1 week for re-assessment.    Return instructions:  Please return if your toes or feet are numb or tingling, appear gray or blue, or you have severe pain (also elevate the leg and loosen splint or wrap if you were given one) Please return to the Emergency Department if you experience worsening symptoms.  Please return if you have any other emergent concerns.  Additional Information:  Your vital signs today were: BP (!) 162/93 (BP Location: Left Arm)   Pulse 92   Temp 98.4 F (36.9 C) (Oral)   Resp 16   LMP 09/04/1988 (Approximate)   SpO2 96%  If your blood pressure (BP) was elevated above 135/85 this visit, please have this repeated by your doctor within one month. --------------

## 2023-02-11 NOTE — ED Triage Notes (Signed)
She c/o pain right distal foot. She states she "dropped a heavy container of water" onto her foot yesterday. She has pain, swelling and redness at #1 mtp joint area. She is able to ambulate with cane.

## 2023-02-11 NOTE — ED Provider Notes (Signed)
[facility] EMERGENCY DEPARTMENT AT [facility] Provider Note   CSN: [id number] Arrival date & time: 02/11/23  4782     History  Chief Complaint  Patient presents with   Foot Injury    Erin Li is a 82 y.o. female.  Patient presents to the emergency department for evaluation of acute onset right foot pain starting yesterday.  Patient was carrying a bottle of water and was trying to put into the refrigerator.  She missed the shelf and fell directly onto her right first MTP area.  She has a history of a bunion in this area.  She states that she was in a lot of pain overnight.  She took ibuprofen without improvement.  She could not even allow the bedsheet to touch the area.  She does not have a history of gout.  She states that the bunion area always appears a little red.  Her husband does have history of gout.  No knee or hip pain.       Home Medications Prior to Admission medications   Medication Sig Start Date End Date Taking? Authorizing Provider  albuterol (VENTOLIN HFA) 108 (90 Base) MCG/ACT inhaler Inhale 2 puffs into the lungs every 6 (six) hours as needed for wheezing or shortness of breath. 02/28/22   Pokhrel, Rebekah Chesterfield, MD  amoxicillin-clavulanate (AUGMENTIN) 875-125 MG tablet Take 1 tablet by mouth 2 (two) times daily. 11/28/22   Glenford Bayley, NP  Ascorbic Acid (VITAMIN C) 1000 MG tablet Take 1,000 mg by mouth daily.    [provider]  atorvastatin (LIPITOR) 10 MG tablet TAKE 1 TABLET BY MOUTH  EVERY EVENING AFTER DINNER Patient not taking: Reported on 12/07/2022 07/15/19   Yates Decamp, MD  b complex vitamins tablet Take 2 tablets by mouth daily.    [provider]  calcium-vitamin D 250-100 MG-UNIT per tablet Take 1 tablet by mouth daily.     [provider]  diltiazem (CARDIZEM) 60 MG tablet Take 1 tablet (60 mg total) by mouth 3 (three) times daily. 02/28/22   Pokhrel, Rebekah Chesterfield, MD  diphenhydrAMINE (BENADRYL) 25 MG tablet Take 50  mg by mouth at bedtime.    [provider]  docusate sodium (COLACE) 100 MG capsule Take 1 capsule (100 mg total) by mouth 2 (two) times daily. 02/28/22   Pokhrel, Rebekah Chesterfield, MD  fish oil-omega-3 fatty acids 1000 MG capsule Take 1,000 mg by mouth daily.    [provider]  fluticasone furoate-vilanterol (BREO ELLIPTA) 100-25 MCG/ACT AEPB inhale ONE PUFF into THE lungs EVERY DAY 12/06/22   Kalman Shan, MD  glucosamine-chondroitin 500-400 MG tablet Take 1 tablet by mouth every morning.    [provider]  LUTEIN PO Take 1 tablet by mouth daily.    [provider]  magnesium 30 MG tablet Take 30 mg by mouth daily in the afternoon.    [provider]  Melatonin 10 MG TABS Take 10 mg by mouth at bedtime.    [provider]  OVER THE COUNTER MEDICATION Take 2 mg by mouth daily in the afternoon. Biotin 2 mg    [provider]  OVER THE COUNTER MEDICATION Place 1 drop into both eyes daily as needed (dry eye). OTC eye drop    [provider]  PAPAYA PO Take 3 tablets by mouth daily as needed (heartburn).    [provider]  predniSONE (DELTASONE) 10 MG tablet 4 tabs for 2 days, then 3 tabs for 2 days, 2 tabs  for 2 days, then 1 tab for 2 days, then stop 11/28/22   Glenford Bayley, NP  predniSONE (DELTASONE) 20 MG tablet Take 1 tablet (20 mg total) by mouth daily with breakfast. 10/25/22   Cobb, Ruby Cola, NP  selenium 50 MCG TABS Take 50 mcg by mouth daily.    [provider]  senna (SENOKOT) 8.6 MG tablet Take 2 tablets by mouth daily.    [provider]  vitamin B-12 (CYANOCOBALAMIN) 1000 MCG tablet Take 1,000 mcg by mouth daily.    [provider]  vitamin E 200 UNIT capsule Take 200 Units by mouth daily.    [provider]      Allergies    Patient has no known allergies.    Review of Systems   Review of Systems  Physical Exam Updated Vital Signs BP (!) 162/93 (BP Location:  Left Arm)   Pulse 92   Temp 98.4 F (36.9 C) (Oral)   Resp 16   LMP 09/04/1988 (Approximate)   SpO2 96%   Physical Exam Vitals and nursing note reviewed.  Constitutional:      Appearance: She is well-developed.  HENT:     Head: Normocephalic and atraumatic.  Eyes:     Pupils: Pupils are equal, round, and reactive to light.  Cardiovascular:     Pulses: Normal pulses. No decreased pulses.  Musculoskeletal:        General: Tenderness present.     Cervical back: Normal range of motion and neck supple.     Right hip: No tenderness.     Right knee: No tenderness.     Right ankle: No tenderness. Normal range of motion.     Right foot: Decreased range of motion. Tenderness and bony tenderness present.     Comments: Patient very tender over the right first MTP area.  Bunion noted.  Laterally there is overlying erythema which patient states is worse than baseline but typically always present.  No significant swelling or bruising.  No pain with passive tenderness of the second through fifth toes.  Skin:    General: Skin is warm and dry.  Neurological:     Mental Status: She is alert.     Sensory: No sensory deficit.     Comments: Motor, sensation, and vascular distal to the injury is fully intact.   Psychiatric:        Mood and Affect: Mood normal.     ED Results / Procedures / Treatments   Labs (all labs ordered are listed, but only abnormal results are displayed) Labs Reviewed - No data to display  EKG None  Radiology DG Foot Complete Right  Result Date: 02/11/2023 CLINICAL DATA:  Right great toe pain after dropping a bottle of water on it today. Patient relates a history of remote fourth and fifth metatarsal fractures. EXAM: RIGHT FOOT COMPLETE - 3+ VIEW COMPARISON:  Radiographs 07/04/2017 FINDINGS: Bony demineralization.  Hallux valgus. Subtle fragmentation of the distal medial head of the first metatarsal at a site of prior spur, compatible with a small marginal fracture of  the first metatarsal head. Chronic callus along the midshaft of the second metatarsal, not present on 07/04/2017, possibly from interval fracture or stress fracture. Subtle periostitis proximally along the fourth metatarsal shaft, mildly increased from 07/04/2017. The MCP joints are extended during imaging, resulting in some suboptimal alignment of the proximal phalanges on frontal and oblique images. There is some irregular spurring of the proximal phalanx of the small toe but  I do not discern a definite fracture. No bony destructive findings. Vascular calcifications noted. Mild deformity of the distal fibula, query old healed fibular fracture. No malalignment at the Lisfranc joint. IMPRESSION: 1. Small marginal fracture of the distal medial head of the first metatarsal. 2. Chronic callus along the midshaft of the second metatarsal, possibly from interval fracture or stress fracture. 3. Subtle periostitis along the fourth metatarsal shaft, mildly increased from 07/04/2017. 4. Hallux valgus. 5. Vascular calcifications. 6. Bony demineralization. Electronically Signed   By: Gaylyn Rong M.D.   On: 02/11/2023 10:44    Procedures Procedures    Medications Ordered in ED Medications - No data to display  ED Course/ Medical Decision Making/ A&P    Patient seen and examined. History obtained directly from patient.   Labs/EKG: None ordered  Imaging: Ordered X-ray R foot.  Medications/Fluids: None ordered  Most recent vital signs reviewed and are as follows: BP (!) 162/93 (BP Location: Left Arm)   Pulse 92   Temp 98.4 F (36.9 C) (Oral)   Resp 16   LMP 09/04/1988 (Approximate)   SpO2 96%   Initial impression: R foot pain/injury  11:09 AM Reassessment performed. Patient appears stable.  She is requesting something for pain.  Discussed use of Tylenol, ibuprofen.  Discussed risks and benefits of stronger pain medication.  Patient would like something stronger for pain.  Patient's husband is  on chronic pain medication and understands the risks and benefits of this.  Will give 2.5 mg p.o. oxycodone for pain control.  Will discharged home with # 6 tablets and instructed to take half a tablet every 6 hours as needed.  Use pain medication only under direct supervision at the lowest possible dose needed to control your pain.   Imaging personally visualized and interpreted including: Shows a bone fragment over the lateral aspect of the first metatarsal head.  Reviewed pertinent lab work and imaging with patient at bedside. Questions answered.   Discussed treatment plan with Dr. Charm Barges. We both agree post-op shoe likely sufficient and to avoid ambulation issues with bulkier cam walker.  Patient is using a cane.  Most current vital signs reviewed and are as follows: BP (!) 162/93 (BP Location: Left Arm)   Pulse 92   Temp 98.4 F (36.9 C) (Oral)   Resp 16   LMP 09/04/1988 (Approximate)   SpO2 96%   Plan: Discharge to home.   Prescriptions written for: Oxycodone, 3 day supply.   Other home care instructions discussed: RICE protocol, OTC meds  ED return instructions discussed: Worsening or uncontrolled symptoms  Follow-up instructions discussed: Patient encouraged to follow-up with their orthopedist or podiatrist in 1 week.   Click here for ABCD2, HEART and other calculatorsREFRESH Note before signing :1}                          Medical Decision Making Amount and/or Complexity of Data Reviewed Radiology: ordered.  Risk Prescription drug management.   Patient with small fracture of the lateral metatarsal head after dropping an object on her foot yesterday.  She had difficulty sleeping due to pain.  X-ray reveals the fracture.  Lower extremity and foot are neurovascularly intact.  No other injuries noted.  Plan for pain control as above.  No indication for emergent orthopedic consultation or hospitalization.        Final Clinical Impression(s) / ED Diagnoses Final  diagnoses:  Closed nondisplaced fracture of first metatarsal bone of right  foot, initial encounter    Rx / DC Orders ED Discharge Orders          Ordered    oxyCODONE (OXY IR/ROXICODONE) 5 MG immediate release tablet  Every 6 hours PRN        02/11/23 1103              Renne Crigler, PA-C 02/11/23 1112    Terrilee Files, MD 02/11/23 [phone]

## 2023-02-15 ENCOUNTER — Telehealth: Payer: Self-pay

## 2023-02-15 NOTE — Telephone Encounter (Signed)
Transition Care Management Unsuccessful Follow-up Telephone Call  Date of discharge and from where:  02/11/2023 [facility]  Attempts:  1st Attempt  Reason for unsuccessful TCM follow-up call:  Left voice message  [facility]  [facility] Population Health Community Resource Care Guide   [email or web address]  ?? [phone]   Website: [email or web address]  [email or web address]

## 2023-02-16 ENCOUNTER — Telehealth: Payer: Self-pay

## 2023-02-16 NOTE — Telephone Encounter (Signed)
Transition Care Management Follow-up Telephone Call Date of discharge and from where: 02/11/2023 [facility] How have you been since you were released from the hospital? Patient is feeling better, but is still experiencing some pain Any questions or concerns? No  Items Reviewed: Did the pt receive and understand the discharge instructions provided? Yes  Medications obtained and verified? Yes  Other?  Patient was very pleased with the care she received from Meridee Score, MD and the entire staff at [facility]. Any new allergies since your discharge? No  Dietary orders reviewed? Yes Do you have support at home? Yes   Follow up appointments reviewed:  PCP Hospital f/u appt confirmed? No  Scheduled to see  on  @ . Specialist Hospital f/u appt confirmed? No  Scheduled to see  on  @ . Are transportation arrangements needed? No  If their condition worsens, is the pt aware to call PCP or go to the Emergency Dept.? Yes Was the patient provided with contact information for the PCP's office or ED? Yes Was to pt encouraged to call back with questions or concerns? Yes  Gurpreet Mariani [facility]  [facility] Population Health Community Resource Care Guide   [email or web address]  ?? [phone]   Website: [email or web address]  [email or web address]

## 2023-02-17 ENCOUNTER — Encounter: Payer: Self-pay | Admitting: Internal Medicine

## 2023-02-21 NOTE — Telephone Encounter (Signed)
Received the following message from patient:   "Thank you I'll make an appointment for October in September. I'm planning another trip to [place] this August would you kindly send me medication's I'll need in case of a flare up.  [facility] in [place], [phone]. Thanks in advance."  MR, can you please advise on the flare up medication? Thanks!

## 2023-03-06 ENCOUNTER — Other Ambulatory Visit: Payer: Self-pay | Admitting: Internal Medicine

## 2023-03-20 ENCOUNTER — Encounter: Payer: Self-pay | Admitting: Internal Medicine

## 2023-03-22 ENCOUNTER — Encounter: Payer: Self-pay | Admitting: Internal Medicine

## 2023-03-22 NOTE — Telephone Encounter (Signed)
    Paxlovid -ok  Augmentin 875 bid x 5 days  Please take prednisone 40 mg x1 day, then 30 mg x1 day, then 20 mg x1 day, then 10 mg x1 day, and then 5 mg x1 day and stop

## 2023-03-26 ENCOUNTER — Encounter: Payer: Self-pay | Admitting: Internal Medicine

## 2023-03-27 DIAGNOSIS — M79671 Pain in right foot: Secondary | ICD-10-CM | POA: Diagnosis not present

## 2023-03-27 DIAGNOSIS — M84371A Stress fracture, right ankle, initial encounter for fracture: Secondary | ICD-10-CM | POA: Diagnosis not present

## 2023-03-29 ENCOUNTER — Other Ambulatory Visit: Payer: Self-pay

## 2023-03-29 MED ORDER — PREDNISONE 10 MG PO TABS: ORAL_TABLET | ORAL | 0 refills | Status: AC

## 2023-03-29 MED ORDER — NIRMATRELVIR/RITONAVIR (PAXLOVID) TABLET (RENAL DOSING): 2.0000 | ORAL_TABLET | Freq: Two times a day (BID) | ORAL | 0 refills | Status: AC

## 2023-03-29 MED ORDER — AMOXICILLIN-POT CLAVULANATE 875-125 MG PO TABS: 1.0000 | ORAL_TABLET | Freq: Two times a day (BID) | ORAL | 0 refills | Status: DC

## 2023-04-03 ENCOUNTER — Other Ambulatory Visit: Payer: Self-pay | Admitting: Internal Medicine

## 2023-04-05 ENCOUNTER — Other Ambulatory Visit (HOSPITAL_BASED_OUTPATIENT_CLINIC_OR_DEPARTMENT_OTHER): Payer: Self-pay

## 2023-04-05 ENCOUNTER — Emergency Department (HOSPITAL_BASED_OUTPATIENT_CLINIC_OR_DEPARTMENT_OTHER)
Admission: EM | Admit: 2023-04-05 | Discharge: 2023-04-05 | Disposition: A | Discharge status: Home / Self Care | Payer: Medicare Other | Attending: Emergency Medicine | Admitting: Emergency Medicine

## 2023-04-05 ENCOUNTER — Encounter (HOSPITAL_BASED_OUTPATIENT_CLINIC_OR_DEPARTMENT_OTHER): Payer: Self-pay | Admitting: Emergency Medicine

## 2023-04-05 DIAGNOSIS — B9689 Other specified bacterial agents as the cause of diseases classified elsewhere: Secondary | ICD-10-CM | POA: Diagnosis not present

## 2023-04-05 DIAGNOSIS — Z7952 Long term (current) use of systemic steroids: Secondary | ICD-10-CM | POA: Insufficient documentation

## 2023-04-05 DIAGNOSIS — J449 Chronic obstructive pulmonary disease, unspecified: Secondary | ICD-10-CM | POA: Diagnosis not present

## 2023-04-05 DIAGNOSIS — H5789 Other specified disorders of eye and adnexa: Secondary | ICD-10-CM | POA: Diagnosis present

## 2023-04-05 DIAGNOSIS — H1033 Unspecified acute conjunctivitis, bilateral: Secondary | ICD-10-CM | POA: Diagnosis not present

## 2023-04-05 DIAGNOSIS — Z7951 Long term (current) use of inhaled steroids: Secondary | ICD-10-CM | POA: Diagnosis not present

## 2023-04-05 MED ORDER — POLYMYXIN B-TRIMETHOPRIM 10000-0.1 UNIT/ML-% OP SOLN
2.0000 [drp] | OPHTHALMIC | 0 refills | Status: DC
  Filled 2023-04-05: qty 10, 17d supply, fill #0

## 2023-04-05 MED ORDER — FLUORESCEIN SODIUM 1 MG OP STRP
1.0000 | ORAL_STRIP | Freq: Once | OPHTHALMIC | Status: DC
  Filled 2023-04-05: qty 1

## 2023-04-05 NOTE — ED Notes (Signed)
Discharge paperwork given and verbally understood. 

## 2023-04-05 NOTE — ED Provider Notes (Signed)
[facility] AT [facility] Provider Note   CSN: [id number] Arrival date & time: 04/05/23  0908     History Chief Complaint  Patient presents with   Eye Problem    Erin Li is a 82 y.o. female patient who presents to the emergency department today for further evaluation of eye redness, itchiness, and drainage.  Symptoms started on Tuesday in the left eye but is now progressed to the right eye as well.  She denies any double vision, blurred vision, loss of vision.  She also denies any weakness or numbness to her arms or legs.  No trouble talking.  She denies rash, fever.  Symptoms are worse in the morning. She denies trauma or injury to the eye.    Eye Problem      Home Medications Prior to Admission medications   Medication Sig Start Date End Date Taking? Authorizing Provider  trimethoprim-polymyxin b (POLYTRIM) ophthalmic solution Place 2 drops into both eyes every 4 (four) hours. 04/05/23  Yes Meredeth Ide, Levin Dagostino M, PA-C  albuterol (VENTOLIN HFA) 108 (90 Base) MCG/ACT inhaler Inhale 2 puffs into the lungs every 6 (six) hours as needed for wheezing or shortness of breath. 02/28/22   Pokhrel, Rebekah Chesterfield, MD  amoxicillin-clavulanate (AUGMENTIN) 875-125 MG tablet Take 1 tablet by mouth 2 (two) times daily. 11/28/22   Glenford Bayley, NP  amoxicillin-clavulanate (AUGMENTIN) 875-125 MG tablet Take 1 tablet by mouth 2 (two) times daily. 03/29/23   Kalman Shan, MD  Ascorbic Acid (VITAMIN C) 1000 MG tablet Take 1,000 mg by mouth daily.    [provider]  atorvastatin (LIPITOR) 10 MG tablet TAKE 1 TABLET BY MOUTH  EVERY EVENING AFTER DINNER Patient not taking: Reported on 12/07/2022 07/15/19   Yates Decamp, MD  b complex vitamins tablet Take 2 tablets by mouth daily.    [provider]  calcium-vitamin D 250-100 MG-UNIT per tablet Take 1 tablet by mouth daily.     [provider]  diltiazem (CARDIZEM) 60 MG tablet Take 1 tablet (60 mg  total) by mouth 3 (three) times daily. 02/28/22   Pokhrel, Rebekah Chesterfield, MD  diphenhydrAMINE (BENADRYL) 25 MG tablet Take 50 mg by mouth at bedtime.    [provider]  docusate sodium (COLACE) 100 MG capsule Take 1 capsule (100 mg total) by mouth 2 (two) times daily. 02/28/22   Pokhrel, Rebekah Chesterfield, MD  fish oil-omega-3 fatty acids 1000 MG capsule Take 1,000 mg by mouth daily.    [provider]  fluticasone furoate-vilanterol (BREO ELLIPTA) 100-25 MCG/ACT AEPB inhale ONE PUFF into THE lungs EVERY DAY 04/03/23   Kalman Shan, MD  glucosamine-chondroitin 500-400 MG tablet Take 1 tablet by mouth every morning.    [provider]  LUTEIN PO Take 1 tablet by mouth daily.    [provider]  magnesium 30 MG tablet Take 30 mg by mouth daily in the afternoon.    [provider]  Melatonin 10 MG TABS Take 10 mg by mouth at bedtime.    [provider]  OVER THE COUNTER MEDICATION Take 2 mg by mouth daily in the afternoon. Biotin 2 mg    [provider]  OVER THE COUNTER MEDICATION Place 1 drop into both eyes daily as needed (dry eye). OTC eye drop    [provider]  oxyCODONE (OXY IR/ROXICODONE) 5 MG immediate release tablet Take 0.5 tablets (2.5 mg total) by mouth every 6 (six) hours as needed for severe pain. 02/11/23  Renne Crigler, PA-C  PAPAYA PO Take 3 tablets by mouth daily as needed (heartburn).    [provider]  predniSONE (DELTASONE) 10 MG tablet 4 tabs for 2 days, then 3 tabs for 2 days, 2 tabs for 2 days, then 1 tab for 2 days, then stop 11/28/22   Glenford Bayley, NP  predniSONE (DELTASONE) 20 MG tablet Take 1 tablet (20 mg total) by mouth daily with breakfast. 10/25/22   Cobb, Ruby Cola, NP  selenium 50 MCG TABS Take 50 mcg by mouth daily.    [provider]  senna (SENOKOT) 8.6 MG tablet Take 2 tablets by mouth daily.    [provider]  vitamin B-12 (CYANOCOBALAMIN) 1000 MCG tablet Take 1,000 mcg  by mouth daily.    [provider]  vitamin E 200 UNIT capsule Take 200 Units by mouth daily.    [provider]      Allergies    Patient has no known allergies.    Review of Systems   Review of Systems  All other systems reviewed and are negative.   Physical Exam Updated Vital Signs BP 131/72 (BP Location: Right Arm)   Pulse 75   Temp 97.8 F (36.6 C) (Oral)   Resp 18   Ht 5\' 5"  (1.651 m)   Wt 63.5 kg   LMP 09/04/1988 (Approximate)   SpO2 99%   BMI 23.30 kg/m  Physical Exam Vitals and nursing note reviewed.  Constitutional:      Appearance: Normal appearance.  HENT:     Head: Normocephalic and atraumatic.     Comments: No evidence of rash on the face or nose. Eyes:     Conjunctiva/sclera:     Right eye: Right conjunctiva is injected.     Left eye: Left conjunctiva is injected.     Comments: Extraocular movements are intact.  Pupils are equal round and reactive to light.  Pulmonary:     Effort: Pulmonary effort is normal.  Skin:    General: Skin is warm and dry.     Findings: No rash.  Neurological:     General: No focal deficit present.     Mental Status: She is alert and oriented to person, place, and time.     Comments: Moves all 4 limbs without difficulty spontaneously.  Psychiatric:        Mood and Affect: Mood normal.        Behavior: Behavior normal.     ED Results / Procedures / Treatments   Labs (all labs ordered are listed, but only abnormal results are displayed) Labs Reviewed - No data to display  EKG None  Radiology No results found.  Procedures Procedures    Medications Ordered in ED Medications  fluorescein ophthalmic strip 1 strip (has no administration in time range)    ED Course/ Medical Decision Making/ A&P   {   Click here for ABCD2, HEART and other calculators  Medical Decision Making Erin Li is a 82 y.o. female patient who presents to the emergency department today for further evaluation  of bilateral eye drainage, redness, and itchiness.  This is likely conjunctivitis considering that started in the left eye and is now in the right eye.  Patient is not having any visual disturbances to indicate any signs of stroke.  I have a low suspicion for Ramsay Hunt syndrome as there is no evidence of rash or pain.  Patient does have a history of COPD and does sound congestion  while talking to her during the interview.  She states that she has had some yellow crusting in the morning when she wakes up.  Will likely treat with ophthalmic drops.  Patient does have erythromycin ointment at home.  Patient does not wear contacts.  I will have her follow-up with her primary care doctor for further evaluation.  Strict return precautions were discussed.  She is safe for discharge.  Risk Prescription drug management.    Final Clinical Impression(s) / ED Diagnoses Final diagnoses:  Acute conjunctivitis of both eyes, unspecified acute conjunctivitis type    Rx / DC Orders ED Discharge Orders          Ordered    trimethoprim-polymyxin b (POLYTRIM) ophthalmic solution  Every 4 hours        04/05/23 0950              Teressa Lower, PA-C 04/05/23 0951    Terrilee Files, MD 04/05/23 1843

## 2023-04-05 NOTE — ED Triage Notes (Signed)
Pt arrives pov, steady gait, Rt eye "feels scratchy".  Used abx eye drops this am pta. Redness noted

## 2023-04-05 NOTE — Discharge Instructions (Addendum)
Please take antibiotic ointment drops as prescribed.  I would like for you to follow-up with your primary care doctor sometime next week to ensure we are going in the right direction.  You may return to the emergency room with any worsening symptoms.

## 2023-04-11 ENCOUNTER — Telehealth: Payer: Self-pay

## 2023-04-11 NOTE — Telephone Encounter (Signed)
Transition Care Management Unsuccessful Follow-up Telephone Call  Date of discharge and from where:  [facility] 8/1  Attempts:  1st Attempt  Reason for unsuccessful TCM follow-up call:  No answer/busy   Lenard Forth [facility] Guide, [facility] [phone] [address] [place], [place], [place] [address] Phone: [phone] Email: [email or web address]

## 2023-04-12 ENCOUNTER — Telehealth: Payer: Self-pay

## 2023-04-12 NOTE — Telephone Encounter (Signed)
Transition Care Management Unsuccessful Follow-up Telephone Call  Date of discharge and from where:  [facility] 8/1  Attempts:  2nd Attempt  Reason for unsuccessful TCM follow-up call:  No answer/busy   Lenard Forth [facility] Guide, [facility] [phone] [address] [place], [place], [place] [address] Phone: [phone] Email: [email or web address]

## 2023-04-13 DIAGNOSIS — H109 Unspecified conjunctivitis: Secondary | ICD-10-CM | POA: Diagnosis not present

## 2023-04-13 DIAGNOSIS — B9689 Other specified bacterial agents as the cause of diseases classified elsewhere: Secondary | ICD-10-CM | POA: Diagnosis not present

## 2023-05-02 ENCOUNTER — Encounter: Payer: Self-pay | Admitting: Internal Medicine

## 2023-05-04 ENCOUNTER — Encounter: Payer: Self-pay | Admitting: Internal Medicine

## 2023-05-04 NOTE — Telephone Encounter (Signed)
  AECOPD   Take doxycycline 100mg  po twice daily x 5 days; take after meals and avoid sunlight   Please take prednisone 40 mg x1 day, then 30 mg x1 day, then 20 mg x1 day, then 10 mg x1 day, and then 5 mg x1 day and stop    No Known Allergies

## 2023-05-07 ENCOUNTER — Encounter: Payer: Self-pay | Admitting: Internal Medicine

## 2023-05-08 ENCOUNTER — Telehealth: Payer: Self-pay | Admitting: *Deleted

## 2023-05-08 ENCOUNTER — Other Ambulatory Visit: Payer: Self-pay | Admitting: *Deleted

## 2023-05-08 MED ORDER — DOXYCYCLINE HYCLATE 100 MG PO TABS: 100.0000 mg | ORAL_TABLET | Freq: Two times a day (BID) | ORAL | 0 refills | Status: DC

## 2023-05-08 MED ORDER — PREDNISONE 10 MG PO TABS: ORAL_TABLET | ORAL | 0 refills | Status: AC

## 2023-05-08 NOTE — Telephone Encounter (Signed)
Called and spoke with patient, advised to ccall the office in the future if she is having symptoms as mychart is not set up for sick messages and it delays the time for her to receive treatment.  Provided recomdations per Dr. Marchelle Gearing.  She verbalized understanding.  Verified pharmacy ad scripts sent.  Advised to call the office if  not better after completing medications for an office visit.  Nothing further needed.

## 2023-06-19 DIAGNOSIS — Z23 Encounter for immunization: Secondary | ICD-10-CM | POA: Diagnosis not present

## 2023-07-12 ENCOUNTER — Encounter: Payer: Self-pay | Admitting: Internal Medicine

## 2023-07-12 ENCOUNTER — Ambulatory Visit: Payer: Medicare Other | Admitting: Internal Medicine

## 2023-07-12 VITALS — BP 132/77 | HR 86 | Ht 65.0 in | Wt 145.4 lb

## 2023-07-12 DIAGNOSIS — J8410 Pulmonary fibrosis, unspecified: Secondary | ICD-10-CM

## 2023-07-12 DIAGNOSIS — J84112 Idiopathic pulmonary fibrosis: Secondary | ICD-10-CM

## 2023-07-12 DIAGNOSIS — R053 Chronic cough: Secondary | ICD-10-CM

## 2023-07-12 DIAGNOSIS — J439 Emphysema, unspecified: Secondary | ICD-10-CM | POA: Diagnosis not present

## 2023-07-12 NOTE — Patient Instructions (Addendum)
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  But ever so slightly since 2015 it is slowly gotten worse.  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

## 2023-07-12 NOTE — Progress Notes (Signed)
OV 04/10/2017   Chief Complaint  Patient presents with   Follow-up    Pt states her breathing is unchanged since last OV. Pt states she has mild non prod cough. Pt denies CP/tightness, f/c/s, and chest congestion.    Follow-up COPD but now has concerned that she has developed interstitial lung disease  She is here with her husband. Back in 2015 a high-resolution CT chest because I thought I heard crackles and thoracic radiology ruled out interstitial lung disease. In the interim she's had lung cancer screening CT chest at overall stability in her pulmonary function test. But there was concern because of crackles that she might have interstitial lung disease so we did a high-resolution CT chest and July 2018 and thoracic radiologist reported as definite precedence of ILD although it is not a UIP pattern. [name] She does say that her son and over the lung transplant in [place] 8 years ago at age of 40 because of IPF. She did have autoimmune panel that is only trace positive for ENP . 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 She denies any humidifier or mildew or mold in the house. The CT chest also shows coronary artery calcification. Of note I personally visualized the CT chest.  ACCP ILD questiin  - symptioms: occ cough x 2 years with some night cough and mild phleogn ona nd off. Very mild non troubling dyspnea x 3 years - pasth medical : GERD +, mild dysphagia + - personal exposure x -: +ver for remote use of rec / street drugs. Cig smoked age 17 though 33 at 6 cigs/dat - fam hx: COPD +, son with transplant for ?  IPF at age 80 - home exposurE: does have hot tube - uses it once a month. 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 Range 04/03/2017 10:18  CK Total Latest Ref Range: 7 - 177 U/L 63  CK-MB Latest Ref Range: 0.3 - 4.0 ng/mL 2.4  Aldolase Latest Ref Range: <=8.1 U/L 3.5  Sed Rate Latest Ref Range: 0 - 30 mm/hr 20  Anit Nuclear Antibody(ANA) Latest Ref Range: NEGATIVE  NEG  Angiotensin-Converting Enzyme Latest Ref Range: 9 - 67 U/L 67  Cyclic Citrullin Peptide Ab Latest Units: Units <16  ds DNA Ab Latest Units: IU/mL 1  ENA RNP Ab Latest Ref Range: 0.0 - 0.9 AI 1.4 (H)  RA Latex Turbid. Latest Ref Range: <14 IU/mL <14  SSA (Ro) (ENA) Antibody, IgG Latest Ref Range: <1.0 NEG AI  <1.0 NEG  SSB (La) (ENA) Antibody, IgG Latest Ref Range: <1.0 NEG AI  <1.0 NEG  Scleroderma (Scl-70) (ENA) Antibody, IgG Latest Ref Range: <1.0 NEG AI  <1.0 NEG    OV 09/28/2017  Chief Complaint  Patient presents with   Follow-up    PFT done today. States she has been doing good and has no complaints of cough, SOB, or CP.    Fu mixed empyhsema and pulmonary fibrosis  For  years of only known who is a COPD patient with a family history of pulmonary fibrosis.  But it appears that in 2018 she started developing signs of pulmonary fibrosis with crackles and  findings of indeterminate UIP on CT chest July 2018.  She has family history of pulmonary fibrosis and that her son had lung transplant for the same.  She now presents for follow-up.  In the interim she started feeling better.  She is less dyspneic.  She continues with the Brio.  There are no new issues.  Pulmonary function test done as documented below shows an improvement.  Therefore instead of biopsy she feels keeping an eye on this is a better approach.    Walking desaturation test on 09/28/2017 185 feet x 3 laps on ROOM AIR:  did NOT desaturate. Rest pulse ox was 99%, final pulse ox was96 %. HR response 78 /min at rest to 105/min at peak exertion. Patient Everly Greeno  Did nto Desaturate < 88% . Sadako Guidone yes  Desaturated </= 3% points. Yarielis  Onley yes did get tachyardic   OV 01/01/2018  Chief Complaint  Patient presents with   Follow-up    PFT done today.  Pt states she has been doing good since last visit. Had a cold mid January 2019 but has been better since. Denies any complaints of cough, SOB, or CP.   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 She continues on Brio. Since last visit January 2019 she continues to feel better. COPD cat score is very minimal as documented below. She had pulmonary function test today and this shows stability in her FVC after initial improvement. Her DLCO is even better. She is on observation course regarding her ILD given the improvement in her pulmonary function test. Her last CT scan of the chest was July 2018        OV 07/09/2018  Subjective:  Patient ID: Shella Maxim, female , DOB: Apr 25, 1941 , age 82 y.o. , MRN: [id number] , ADDRESS: [address] [place] [address]   07/09/2018 -   Chief Complaint  Patient presents with   Consult    Alot of coughing producing mucus no color at this time.     HPI Corleen Fiebig 82 y.o. 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  She tells me that for the last 3 weeks has had increased cough with congestion and white sputum more than baseline but activities of daily living and shortness of breath or guarding is all unchanged and she has good effort tolerance.  There are no new other issues.  In terms of ILD: Last visit I thought I will be in improved based on improvement in pulmonary function test but actually that might of been a red herring because today's pulmonary function test shows a decline which is in line with previous pulmonary function test showing stability.  She certainly feels stable.  In July 2019  she had a low-dose CT scan for lung cancer and this ruled out lung cancer on screening.  They were able to report some mildly changes at the base.  She is not interested in surgical lung biopsy because her son who had pulmonary fibrosis finally died from 8 years of transplant from pulmonary embolism, stroke and acute renal failure and chronic critical illness in the rehab facility.      OV 10/08/2019  Subjective:  Patient ID: Shella Maxim, female , DOB: 31-Oct-1940 , age 76 y.o. ,  MRN: [id number] , ADDRESS: [address] [place] [address]   10/08/2019 -   Chief Complaint  Patient presents with   Follow-up    Pt states she has been doing well since last visit and states her breathing has been doing well. Pt will have an occ cough.   predominantly COPD with some amount of basal pulmonary fibrosis  N(atlernate to UIP)  in the setting of her son having pulmonary fibrosis alt- decieasedd 2019    HPI Carlinda Mangini 82 y.o. 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  Last seen in #2019.  She presents with her husband.  Since the onset of the COVID-19 pandemic there have been in the house and socially isolated.  She has been doing well without any problems overall stable.  She not exercising as much as she used to.  She not taking any inhalers for her COPD.  She only takes Mucinex.  Therefore her cough symptom with mucus is slightly worse than before.  She had high-resolution CT scan of the chest which I reviewed the report and agree with the findings and I interpreted as well with visualization: She has emphysema associated with alternate diagnosis pattern.  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  She still grieving from her son who died in 10-02-2019from pulmonary fibrosis.    OV 06/01/2020   Subjective:  Patient ID: Shella Maxim, female , DOB: [birth year], age 36 y.o. years. , MRN: [id number],  ADDRESS: [address] [place] [address] PCP   Pearson Grippe, MD Providers : Treatment Team:  Attending Provider: Kalman Shan, MD   Chief Complaint  Patient presents with   Follow-up    ILD, no concerns    predominantly COPD with some amount of basal pulmonary fibrosis  N(atlernate to UIP)  in the setting of her son having pulmonary fibrosis alt- decieasedd 2019   HPI Jenisse Belzer 82 y.o. -presents for follow-up.  Last seen February 2021.  Since then she is doing stable.  She continues to be stable.  Her symptom scores are very minimal and shows stability.  She had pulmonary function test August 2021 and compared to November 2019 she is stable.  Her walking desaturation test is also stable.  Her last CT scan was January 2021.  She prefers to have monitoring for pulmonary fibrosis because of her son's issue.  She likes the strategy of alternating pulmonary function test and also CT scan of the chest.      OV 12/06/2021  Subjective:  Patient ID: Shella Maxim, female , DOB: 1940-10-23 , age 58 y.o. , MRN: [id number] , ADDRESS: [address] [place] [address] PCP Pearson Grippe, MD Patient Care Team: Pearson Grippe, MD as PCP - General (Internal Medicine)  This Provider for this visit: Treatment Team:  Attending Provider: Kalman Shan, MD   Follow-up emphysema with some amount of ILD.  Last seen in 2020/06/05.  It is now 18 months since I last saw her.  Doing well overall.  Last month she did have a flareup requiring antibiotics and prednisone for COPD exacerbation.  She continues on [place].  No new health issues.  April 23, 2022 she plans to go to [place].  She is wanting antibiotic and prednisone.  Her last pulmonary function test was a year and a half ago.  Her last CT scan of the chest was a year ago or more.  She is agreeable to have follow-up reassessment done.  Symptom score  is as below and stable.  Walking desaturation test shows a tendency to desaturate but still acceptable.    OV  05/18/2022   Subjective:  Patient ID: [name], female , DOB: 1940-12-04, age 66 y.o. years. , MRN: [id number],  ADDRESS: [address] [place] [address] PCP  [name], DO Providers : Treatment Team:  Attending Provider: Kalman Shan, MD Patient Care Team: [name], DO as PCP - General (Family Medicine)    Chief Complaint  Patient presents with   Follow-up    PFT performed today.  Pt states she has been doing okay since last visit. States for the past 2 weeks she has been coughing a lot getting up white phlegm and states cough is worse at night.    Follow-up emphysema with some amount of ILD   HPI Fatime S Massengale 46 y.o. -[son also has ILD].  Returns for follow-up.  In August 2023 she went to [place] with her female side of the family.  She return May 02, 2022.  She had a great time.  No issues walking around the.  Then after coming back for the last 2 weeks she has respiratory symptoms particularly increased cough for the last 2 to 3 weeks associated with white sputum occasionally yellow.  Definitely worse than baseline.  Moderate in severity and but no fever.  No chills no worsening shortness of breath.  No wheezing.  No edema no orthopnea no chills.  However pulmonary function test today shows a decline [see below].  Despite this increase in symptoms her subjective symptom score below is stable.  Of note her husband is here with her today.    PFT   Narrative & Impression  CLINICAL DATA:  Interstitial lung disease   EXAM: CT CHEST WITHOUT CONTRAST   TECHNIQUE: Multidetector CT imaging of the chest was performed following the standard protocol without intravenous contrast. High resolution imaging of the lungs, as well as inspiratory and expiratory imaging, was performed.   RADIATION DOSE REDUCTION: This exam was performed according to the departmental dose-optimization program which includes automated exposure control, adjustment of the  mA and/or kV according to patient size and/or use of iterative reconstruction technique.   COMPARISON:  10/01/2020, 09/23/2019, 09/27/2018   FINDINGS: Cardiovascular: Aortic atherosclerosis. Normal heart size. Left and right coronary artery calcifications. No pericardial effusion.   Mediastinum/Nodes: No enlarged mediastinal, hilar, or axillary lymph nodes. Thyroid gland, trachea, and esophagus demonstrate no significant findings.   Lungs/Pleura: Moderate centrilobular and paraseptal emphysema. Unchanged mild pulmonary fibrosis in a pattern with apical to basal gradient, featuring irregular peripheral interstitial opacity, septal thickening, and ground-glass, mild, tubular bronchiectasis, with small areas of subpleural bronchiolectasis at the lung bases. No significant air trapping on expiratory phase imaging. Multiple small bilateral pulmonary nodules are stable and benign, largest a 0.9 cm nodule of the superior segment right lower lobe (series 4, image 57). No pleural effusion or pneumothorax.   Upper Abdomen: No acute abnormality.   Musculoskeletal: No chest wall abnormality. No suspicious osseous lesions identified.   IMPRESSION: 1. Unchanged mild pulmonary fibrosis in a pattern with apical to basal gradient, featuring irregular peripheral interstitial opacity, septal thickening, and ground-glass, with small areas of subpleural bronchiolectasis at the lung bases. Fibrotic findings are not significantly changed compared to immediate prior examination, although as previously reported are worsened over a longer period of time dating back to at least 2015. Findings are categorized as probable UIP per consensus guidelines: Diagnosis of Idiopathic Pulmonary Fibrosis:  An Official ATS/ERS/JRS/ALAT Clinical Practice Guideline. Am Rosezetta Schlatter Crit Care Med Vol 198, Iss 5, [phone], May 05 2017. 2. Multiple small bilateral pulmonary nodules are stable and definitively benign. 3.  Emphysema. 4. Coronary artery disease.   Aortic Atherosclerosis (ICD10-I70.0) and Emphysema (ICD10-J43.9).     Electronically Signed   By: Jearld Lesch M.D.   On: 02/25/2022 17:51    No results found.     OV 08/01/2022  Subjective:  Patient ID: [name], female , DOB: Nov 04, 1940 , age 22 y.o. , MRN: [id number] , ADDRESS: [address] [place] [address] PCP [name], DO Patient Care Team: [name], DO as PCP - General (Family Medicine)  This Provider for this visit: Treatment Team:  Attending Provider: Kalman Shan, MD  Follow-up emphysema with some amount of ILD  08/01/2022 -   Chief Complaint  Patient presents with   Follow-up     HPI Ariela MILANY GECK 82 y.o. -presents with her husband.  After the recent flareup she is better.  However she ended up with back pain and sciatica 6 weeks ago.  She got prednisone course but it did not help.  Since then she has not acupuncture with massage therapy and she is significantly better although she still requiring a cane.  Therefore we did not walk her today.  She had a repeat pulmonary function test that shows slight improvement in FVC but the DLCO is still the same.  Her FVC over the last 5 to 8 years is range bound but the DLCO seems to taken a turn for the worse compared to several years ago.  Still the decline is not greater than 5 to 10% in the last 1 or 2 years to meet progressive phenotype definition.  I did visualize the CT scan currently with and also compared it to the old one.  There is definitely more ILD findings now compared to 2015 but is reported she does not meet the classic definition of progressive phenotype.  The pet bird she had is now deceased for the last 5 years.  Her son himself had IPF/ILDand had lung transplant several years ago.  She is willing to see genetics counselor.  Her husband prefers an aggressive approach with potential antifibrotic's because of the slow progression in  the future course being uncertain.  She prefers a more wait and watch approach.  We took a shared decision making to discuss in case conference repeat pulmonary function test and then decide    Husband is particularly worried about chronic cough.  Is particular worse in the night.  Is been going on for a long time but worse in the last 6 months definitely much worse at night.  She taking fish oil for many years but recently she has also been having heartburn.  There is no wheezing.  He is worried if it could be related to ILD.  Xxxxxxxxxxxxxxxxxxxxxxxxxxxxxxxx  DEC 2023 Brief History: "MR patient for > 18 years. Followed as gold stage 2 copd. but had bird that died 5 years ago. Son had transplant for IPF in [place] 10-15 years ago. She even was in copd trial.  Ratios always obstrucive with fev1 1.4L/67% and current DLCO 10.  Several years ago heard crackles in lung base but CT always read as no ILD and just atelectasis. Now CT says ILD and worse. FVC in 2018 - 2.2-2.38L -> now 2.44 DLC in 2018- 11.5-12.5 -> now 10.8 Does not meet Progr Phenotype definition for ILD (>  5-10% decline in 1-2 years) Questions:  Does she have ILD  Is this UIP  Would you biopsy OR Rx OR watch?"  iscussion synopsis:  2018 -> june 2023: Ches CT bibasalar ILD (TB, GGO, subplerua reticualtion, No HC. No air trapping). Per Poff - not progressing.  in retrospect even in 2018 ILD +.. In 2015: ILA. Definite progression since 2015.  NEW RLL NODULAR OPACITY - June 2023 reported in conference but not in officia report - What is the final conclusion per 2018 ATS/Fleischner Criteria - PROBABLE UIP  - Concordance with official report: DISCORDANT - Conference has picked up a new RLL NODULE  Pathology discussion of biopsy NO:    MDD Impression/Recs: 1) nodule - consier PET/ repeat CT 2) Prob UIP (fam hx,  emphysema, age > 65,s eroplogy) = IPF -> Rx with antifibotic. 3) emphysema + -> currently ILD > emphysema but barely      IMPRESSION: Dominant nodule in the superior segment of the right lower lobe has been present since 2014 consistent with a benign process. Other nodules also demonstrated long-term presence consistent with benign process.   Underlying emphysematous lung changes. Interstitial process also seen as on the prior high-resolution chest CT scan.     Electronically Signed   By: Karen Kays M.D.   On: 09/05/2022 18:24 OV 09/22/2022  Subjective:  Patient ID: [name], female , DOB: 12/01/1940 , age 56 y.o. , MRN: [id number] , ADDRESS: [address] [place] [address] PCP [name], DO Patient Care Team: [name], DO as PCP - General (Family Medicine)  This Provider for this visit: Treatment Team:  Attending Provider: Kalman Shan, MD    09/22/2022 -   Chief Complaint  Patient presents with   Follow-up    Pft review,      HPI Brenetta MICHELLA DETJEN 82 y.o. -returns for follow-up.  At this visit she is here with her husband.  He is acting as an independent historian.  She tells me that overall she is stable but she is having chronic cough.  She is quite bothered by the chronic cough.  She tells me that over the last year or 2 it slowly gotten worse but since last visit it is stable.  Nighttime cough is worse than daytime.  It does wake her husband up.  She does bring "junk".  It is clear but sometimes slightly yellow-tinged.  There is no wheezing.  Happening despite Breo.  Happening despite stopping fish oil.  Her shortness of breath is stable.  We discussed several things today  -Chronic cough: Okay to restart fish oil but will also check blood allergy panel and CBC with differential -COPD: Is stable.  She will continue inhaler therapy  -Right lower lobe lung nodule: This was parted during the case conference.  We did a super D CT chest and the current radiologist has said it has been stable for 10 years.  No further follow-up  -Pulmonary fibrosis: The  concern at the conference is that she has IPF based on probable UIP on the CT chest, slow emergence and progression and also son having pulmonary fibrosis IPF.  Last serologies showed trace ANA positive but that was 10 years ago.  She is agreeable for repeat.  We discussed therapeutic options including standard of care nintedanib.  She said that many years ago she did have a cardiac cath and she did have a blockage but no stent was placed or angioplasty was done.  We also  discussed standard of care pirfenidone and the side effects.  We then discussed a modified pirfenidone through a clinical trial [facility].  The side effect profile here is less but for the first 6 months she could get placebo standard of care pirfenidone or the study drug.  Then after 6 months should be randomized to different doses of the study drug which research is on show for his one third less side effect profile than standard of care pirfenidone.  She is more interested in this option.[name]  However we do need to get serologies.       OV 12/07/2022  Subjective:  Patient ID: [name], female , DOB: 24-Jan-1941 , age 57 y.o. , MRN: [id number] , ADDRESS: [address] [place] [address] PCP [name], DO Patient Care Team: [name], DO as PCP - General (Family Medicine)  This Provider for this visit: Treatment Team:  Attending Provider: Kalman Shan, MD    12/07/2022 -   Chief Complaint  Patient presents with   Follow-up    F/up PFT     HPI Deyjah Lynelle Doctor 82 y.o. -returns for follow-up.  At last visit after multidisciplinary case conference gave her a definite of IPF diagnosis.  This is slowly crept in.  Should try to join a clinical trial but she did not qualify because of the combined emphysema.  Then we applied for nintedanib but she was told in February 2024 that her co-pay was over $3000.  I later found out and explained to her today is because of the new inflation reduction act and the donut  hole issues around co-pay before they reach catastrophic coverage.  She and her husband process the information but they do not believe they have the capital resources to spend $3000 on co-pay.  So she is opting to stay on supportive care for her ILD.  She feels stable.  In fact her pulmonary function test is slightly better/stable compared to the recent several months.  She is reassured by this.  Last summer she traveled to [place] but this summer they are not going to travel anywhere.  She has not had RSV vaccine.  I advocated for this.  She says she will have it.  Of note her walking desaturation test today was stable.  For her emphysema she continues on Breo     OV 07/12/2023  Subjective:  Patient ID: [name], female , DOB: 29-Jan-1941 , age 41 y.o. , MRN: [id number] , ADDRESS: [address] [place] [address] PCP [name], DO Patient Care Team: [name], DO as PCP - General (Family Medicine)  This Provider for this visit: Treatment Team:  Attending Provider: Kalman Shan, MD    07/12/2023 -   Chief Complaint  Patient presents with   Follow-up    Pt denies any concerns    #Pulmonary emphysema  -On Breo #IPF with history of family of pulmonary fibrosis -indolent and slowly increased.   Son with interstitial lung disease and status post transplant died 07-21-18 approx Supportive care [declined antifibrotic's due to cost issue]  HPI Oline S Lawless 82 y.o. -returns for follow-up.  Presents with her husband.  In August 2024 she went to [place] and also [place] with her daughter and granddaughter.  The husband did not accompany her according to him.  He is an independent historian today.  Both shortness of breath and cough are stable [see symptom score below].  However the cough is affecting her quality of  life.  Husband states at night he is not able to sleep with her because of the cough and he also hears rattling.  Record review shows normal blood  eosinophil and normal allergy testing essentially.  She wants ways to control the cough.  The cough is made worse by eating sweets particularly chocolates.  She admits that she might have acid reflux.  She did try to stop the fish oil to see if the cough would improve but it did not.  She does not think fish oil is the cause for this.  Otherwise no hospitalizations emergency room visits.  Only new medical problems conjunctivitis.      SYMPTOM SCALE -  06/01/2020  12/06/2021  05/18/2022 Copd exac  08/01/2022  07/12/2023   O2 use ra ra0 ra ra ra  Shortness of Breath 0 -> 5 scale with 5 being worst (score 6 If unable to do) 0     At rest 0 0 0 0 0  Simple tasks - showers, clothes change, eating, shaving 0 00 0 0 0  Household (dishes, doing bed, laundry) 0  0 0 0  Shopping 0 0 0 0 0  Walking level at own pace 0 0 0 0 0  Walking up Stairs 2 2 2 3 3   Total (30-36) Dyspnea Score 2 2 2 3 3   How bad is your cough? 2 3 3  0 3  How bad is your fatigue 0 0 0 00 00  How bad is nausea 00 0 0 0 0  How bad is vomiting?  0 0 0 0 0  How bad is diarrhea? 0 0 0 0 0  How bad is anxiety? 0 0 0 0 0  How bad is depression 0 0 0 0 0  ra ra  Simple office walk 185 feet x  3 laps goal with forehead probe 12/06/2021  12/07/2022  07/12/2023   O2 used ra ra ra  Number laps completed 3 3 Sit stand x 10-15  Comments about pace avg    Resting Pulse Ox/HR 97% and 91/min 100% and HR 72 93% and HR 79  Final Pulse Ox/HR 93% and 112/min 94% and HR 101 90% and HR 101  Desaturated </= 88% no no   Desaturated <= 3% points yes Yes, 6   Got Tachycardic >/= 90/min yes yes   Symptoms at end of test No complaints N compaints   Miscellaneous comments x       PFT     Latest Ref Rng & Units 12/04/2022    8:48 AM 09/22/2022   11:43 AM 08/01/2022    9:17 AM 05/18/2022    3:08 PM 04/06/2020   10:48 AM 07/09/2018   12:49 PM 01/01/2018    8:51 AM  ILD indicators  FVC-Pre L 2.47  2.40  2.44  2.17  2.38  2.33  2.38    FVC-Predicted Pre % 90  87  87  78  83  79  81   DLCO uncorrected ml/min/mmHg 11.27  10.61  10.86  10.58  13.11  12.18  20.54   DLCO UNC %Pred % 57  54  55  54  66  47  80   DLCO Corrected ml/min/mmHg 11.27  10.61  10.86  10.58  13.11     DLCO COR %Pred % 57  54  55  54  66         LAB RESULTS last 96 hours No results found.  LAB RESULTS last 90  days No results found for this or any previous visit (from the past 2160 hour(s)).       has a past medical history of Coronary artery disease, Diverticulosis, HTN (hypertension), and Lung nodule.   reports that she quit smoking about 11 years ago. Her smoking use included cigarettes. She started smoking about 61 years ago. She has a 37.5 pack-year smoking history. She has never used smokeless tobacco.  Past Surgical History:  Procedure Laterality Date   CARDIAC CATHETERIZATION     CORONARY ANGIOPLASTY     INTRAMEDULLARY (IM) NAIL INTERTROCHANTERIC Right 02/26/2022   Procedure: INTRAMEDULLARY (IM) NAIL INTERTROCHANTRIC;  Surgeon: Bjorn Pippin, MD;  Location: [facility] OR;  Service: Orthopedics;  Laterality: Right;   TONSILLECTOMY AND ADENOIDECTOMY      No Known Allergies  Immunization History  Administered Date(s) Administered   Fluad Quad(high Dose 65+) 06/20/2022   Influenza Split 06/04/2013, 05/05/2014   Influenza Whole 05/28/2012   Influenza, High Dose Seasonal PF 06/05/2016, 06/04/2017, 05/20/2018, 05/06/2019   Influenza, Quadrivalent, Recombinant, Inj, Pf 05/25/2021   Influenza,inj,Quad PF,6+ Mos 06/05/2015   Influenza,trivalent, recombinat, inj, PF 06/19/2023   Moderna Sars-Covid-2 Vaccination 11/10/2019, 12/08/2019, 06/30/2020, 01/11/2021, 08/02/2021   PNEUMOCOCCAL CONJUGATE-20 11/17/2020   Pfizer(Comirnaty)Fall Seasonal Vaccine 12 years and older 06/19/2023   Pneumococcal Conjugate-13 01/26/2015   Pneumococcal Polysaccharide-23 09/04/2005, 09/24/2012   Tdap 03/20/2012    Family History  Problem Relation Age of Onset    Heart attack Father    Kidney disease Father    COPD Mother    Lung cancer Brother 34   Pulmonary fibrosis Son        had lung transplant     Current Outpatient Medications:    albuterol (VENTOLIN HFA) 108 (90 Base) MCG/ACT inhaler, Inhale 2 puffs into the lungs every 6 (six) hours as needed for wheezing or shortness of breath., Disp: 8 g, Rfl: 2   amoxicillin-clavulanate (AUGMENTIN) 875-125 MG tablet, Take 1 tablet by mouth 2 (two) times daily., Disp: 14 tablet, Rfl: 0   amoxicillin-clavulanate (AUGMENTIN) 875-125 MG tablet, Take 1 tablet by mouth 2 (two) times daily., Disp: 14 tablet, Rfl: 0   Ascorbic Acid (VITAMIN C) 1000 MG tablet, Take 1,000 mg by mouth daily., Disp: , Rfl:    atorvastatin (LIPITOR) 10 MG tablet, TAKE 1 TABLET BY MOUTH  EVERY EVENING AFTER DINNER, Disp: 30 tablet, Rfl: 1   b complex vitamins tablet, Take 2 tablets by mouth daily., Disp: , Rfl:    calcium-vitamin D 250-100 MG-UNIT per tablet, Take 1 tablet by mouth daily. , Disp: , Rfl:    diltiazem (CARDIZEM) 60 MG tablet, Take 1 tablet (60 mg total) by mouth 3 (three) times daily., Disp: , Rfl:    diphenhydrAMINE (BENADRYL) 25 MG tablet, Take 50 mg by mouth at bedtime., Disp: , Rfl:    docusate sodium (COLACE) 100 MG capsule, Take 1 capsule (100 mg total) by mouth 2 (two) times daily., Disp: 10 capsule, Rfl: 0   doxycycline (VIBRA-TABS) 100 MG tablet, Take 1 tablet (100 mg total) by mouth 2 (two) times daily., Disp: 10 tablet, Rfl: 0   fish oil-omega-3 fatty acids 1000 MG capsule, Take 1,000 mg by mouth daily., Disp: , Rfl:    fluticasone furoate-vilanterol (BREO ELLIPTA) 100-25 MCG/ACT AEPB, inhale ONE PUFF into THE lungs EVERY DAY, Disp: 60 each, Rfl: 11   glucosamine-chondroitin 500-400 MG tablet, Take 1 tablet by mouth every morning., Disp: , Rfl:    LUTEIN PO, Take 1 tablet by mouth daily.,  Disp: , Rfl:    magnesium 30 MG tablet, Take 30 mg by mouth daily in the afternoon., Disp: , Rfl:    Melatonin 10 MG TABS,  Take 10 mg by mouth at bedtime., Disp: , Rfl:    OVER THE COUNTER MEDICATION, Take 2 mg by mouth daily in the afternoon. Biotin 2 mg, Disp: , Rfl:    OVER THE COUNTER MEDICATION, Place 1 drop into both eyes daily as needed (dry eye). OTC eye drop, Disp: , Rfl:    oxyCODONE (OXY IR/ROXICODONE) 5 MG immediate release tablet, Take 0.5 tablets (2.5 mg total) by mouth every 6 (six) hours as needed for severe pain., Disp: 6 tablet, Rfl: 0   PAPAYA PO, Take 3 tablets by mouth daily as needed (heartburn)., Disp: , Rfl:    selenium 50 MCG TABS, Take 50 mcg by mouth daily., Disp: , Rfl:    senna (SENOKOT) 8.6 MG tablet, Take 2 tablets by mouth daily., Disp: , Rfl:    trimethoprim-polymyxin b (POLYTRIM) ophthalmic solution, Place 2 drops into both eyes every 4 (four) hours., Disp: 10 mL, Rfl: 0   vitamin B-12 (CYANOCOBALAMIN) 1000 MCG tablet, Take 1,000 mcg by mouth daily., Disp: , Rfl:    vitamin E 200 UNIT capsule, Take 200 Units by mouth daily., Disp: , Rfl:       Objective:   Vitals:   07/12/23 1453  BP: 132/77  Pulse: 86  SpO2: 94%  Weight: 145 lb 6.4 oz (66 kg)  Height: 5\' 5"  (1.651 m)    Estimated body mass index is 24.2 kg/m as calculated from the following:   Height as of this encounter: 5\' 5"  (1.651 m).   Weight as of this encounter: 145 lb 6.4 oz (66 kg).  @WEIGHTCHANGE @  [employer]   07/12/23 1453  Weight: 145 lb 6.4 oz (66 kg)     Physical Exam   General: No distress. Looks wel.. Coughs dduring sit stand test O2 at rest: no Cane present: no Sitting in wheel chair: no Frail: no Obese: no Neuro: Alert and Oriented x 3. GCS 15. Speech normal Psych: Pleasant Resp:  Barrel Chest - no.  Wheeze - no, Crackles - YES BASE, No overt respiratory distress CVS: Normal heart sounds. Murmurs - no Ext: Stigmata of Connective Tissue Disease - no HEENT: Normal upper airway. PEERL +. No post nasal drip        Assessment:       ICD-10-CM   1. 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 +/- cough neuropaty  +/- GERD (cough gets worse with chocholates)  Plan - continue breo for emphysema - month 1 - take start spiriva respimat 2 puff once daily  - monht 2 - start OTC prilosec 20mg  on empty stomatch daily and continue both till next visit   Combined pulmonary fibrosis and  emphysema (CPFE) (HCC) Pulmonary emphysema with fibrosis of lung (HCC)  -Clinically stable   Plan - Continue inhaler therapy as before; breo - add spiriva as above cough section - monitor frequency of flare up; if increases can consider daliresp  ILD (interstitial lung disease) (HCC) IPF Family history of pulmonary fibrosis Long-term exposure involving bird droppings  -You have extremely mild interstitial lung disease as of 2023 predominantly in the lung base.  But ever so slightly since 2015 it is slowly gotten worse.  At conference the concern is that this Is IPF   -Did not qualify for clinical trial on account of combined emphysema  - Not interested in standard of care nintedanib because of expensive upfront co-pay  -Function test currently April 2024 stable and on 07/12/2023 = based on symptoms/eercise test you are stable  Plan -Continue supportive care - check spirometry and dlco in 3 months  - do HRCT in  3 months (last JAn 2024)   RLL lung ndoule - dec 2023 =- sstable x 10 days  - stable x 10 years  Plan  - no  further followup    Plan -Return to see Dr. Marchelle Gearing for a 15-minute visit in 3 months but  after spirometry and DLCO  -Symptom questionnaire and walking desaturation test at follow-up   FOLLOWUP Return in about 3 months (around 10/12/2023) for 15 min visit, with Dr Marchelle Gearing, Face to Face Visit.  ( Level 05 visit E&M 2024: Estb >= 40 min   in  visit type: on-site physical face to visit  in total care time and counseling or/and coordination of care by this undersigned MD - Dr Kalman Shan. This includes one or more of the following on this same day 07/12/2023: pre-charting, chart review, note writing, documentation discussion of test results, diagnostic or treatment recommendations, prognosis, risks and benefits of management options, instructions, education, compliance or risk-factor reduction. It excludes time spent by the CMA or office staff in the care of the patient. Actual time 40 min)   SIGNATURE    Dr. Kalman Shan, M.D., F.C.C.P,  Pulmonary and Critical Care Medicine Staff Physician, [facility] Center Director - Interstitial Lung Disease  Program  P[facility] [place], [place], [address]  Pager: [phone], If no answer or between  15:00h - 7:00h: call [phone] Telephone: [phone]  5:32 PM 07/12/2023   Moderate Complexity MDM OFFICE  2021 E/M guidelines, first released in 2021, with minor revisions added in 2023 and 2024 Must meet the requirements for 2 out of 3 dimensions to qualify.    Number and complexity of problems addressed Amount and/or complexity of data reviewed Risk of complications and/or morbidity  One or more chronic illness with mild exacerbation, OR progression, OR  side effects of treatment  Two or more stable chronic illnesses  One undiagnosed new problem with uncertain prognosis  One acute illness with systemic symptoms   One Acute complicated injury Must meet the requirements for 1 of 3 of the categories)  Category 1: Tests and documents, historian  Any  combination of 3 of the following:  Assessment requiring an independent historian  Review of prior external note(s) from each unique source  Review of results of each unique test  Ordering of each unique test    Category 2: Interpretation of tests   Independent interpretation of a test performed by another physician/other qualified health care professional (not separately reported)  Category 3: Discuss management/tests  Discussion of management or test interpretation with external physician/other qualified health care professional/appropriate source (not separately reported) Moderate risk of morbidity from additional diagnostic testing or treatment Examples only:  Prescription drug management  Decision regarding minor surgery with identfied patient or procedure risk factors  Decision regarding elective major surgery without identified patient or procedure risk factors  Diagnosis or treatment significantly limited by social determinants of health             HIGh Complexity  OFFICE   2021 E/M guidelines, first released in 2021, with minor revisions added in 2023. Must meet the requirements for 2 out of 3 dimensions to qualify.    Number and complexity of problems addressed Amount and/or complexity of data reviewed Risk of complications and/or morbidity  Severe exacerbation of chronic illness  Acute or chronic illnesses that may pose a threat to life or bodily function, e.g., multiple trauma, acute MI, pulmonary embolus, severe respiratory distress, progressive rheumatoid arthritis, psychiatric illness with potential threat to self or others, peritonitis, acute renal failure, abrupt change in neurological status Must meet the requirements for 2 of 3 of the categories)  Category 1: Tests and documents, historian  Any combination of 3 of the following:  Assessment requiring an independent historian  Review of prior external note(s) from each unique source  Review  of results of each unique test  Ordering of each unique test    Category 2: Interpretation of tests    Independent interpretation of a test performed by another physician/other qualified health care professional (not separately reported)  Category 3: Discuss management/tests  Discussion of management or test interpretation with external physician/other qualified health care professional/appropriate source (not separately reported)  HIGH risk of morbidity from additional diagnostic testing or treatment Examples only:  Drug therapy requiring intensive monitoring for toxicity  Decision for elective major surgery with identified pateint or procedure risk factors  Decision regarding hospitalization or escalation of level of care  Decision for DNR or to de-escalate care   Parenteral controlled  substances            LEGEND - Independent interpretation involves the interpretation of a test for which there is a CPT code, and an interpretation or report is customary. When a review and interpretation of a test is performed and documented by the provider, but not separately reported (billed), then this would represent an independent interpretation. This report does not need to conform to the usual standards of a complete report of the test. This does not include interpretation of tests that do not have formal reports such as a complete blood count with differential and blood cultures. Examples would include reviewing a chest radiograph and documenting in the medical record an interpretation, but not separately reporting (billing) the interpretation of the chest radiograph.   An appropriate source includes professionals who are not health care professionals but may be involved in the management of the patient, such as a Clinical research associate, upper officer, case manager or teacher, and does not include discussion with family or informal caregivers.    - SDOH: SDOH are the conditions in the  environments where people are born, live, learn, work, play, worship, and age that affect a wide range of health, functioning, and quality-of-life outcomes and risks. (e.g., housing, food insecurity, transportation, etc.). SDOH-related Z codes ranging from Z55-Z65 are the ICD-10-CM diagnosis codes used to document SDOH data Z55 - Problems related to education and literacy Z56 - Problems related to employment  and unemployment Z57 - Occupational exposure to risk factors Z58 - Problems related to physical environment Z59 - Problems related to housing and economic circumstances [phone] - Problems related to social environment [phone] - Problems related to upbringing [phone] - Other problems related to primary support group, including family circumstances Z67 - Problems related to certain psychosocial circumstances Z65 - Problems related to other psychosocial circumstances

## 2023-08-06 ENCOUNTER — Telehealth: Payer: Self-pay | Admitting: Internal Medicine

## 2023-08-06 NOTE — Telephone Encounter (Signed)
Primary Pulmonologist: Ramaswamy Last office visit and with whom: 07/12/2023 Ramaswamy What do we see them for (pulmonary problems): Emphysema with fibrosis of lung, IPF, chronic cough Last OV assessment/plan:   Plan - Continue inhaler therapy as before; breo - add spiriva as above cough section - monitor frequency of flare up; if increases can consider daliresp   ILD (interstitial lung disease) (HCC) IPF Family history of pulmonary fibrosis Long-term exposure involving bird droppings   -You have extremely mild interstitial lung disease as of 2023 predominantly in the lung base.  But ever so slightly since 2015 it is slowly gotten worse.  At conference the concern is that this Is IPF    -Did not qualify for clinical trial on account of combined emphysema   - Not interested in standard of care nintedanib because of expensive upfront co-pay   -Function test currently April 2024 stable and on 07/12/2023 = based on symptoms/eercise test you are stable   Plan -Continue supportive care - check spirometry and dlco in 3 months  - do HRCT in  3 months (last JAn 2024)     RLL lung ndoule - dec 2023 =- sstable x 10 days   - stable x 10 years   Plan  - no  further followup  Was appointment offered to patient (explain)?  None available   Reason for call: Coughing with brown mucous for 3 days, states there is a small amount of green/yellow as well..  Took temp and 100.2, took asprin and it went away and did not come back.  Small amount of of congestion in head.  Has not been using Albuterol inhaler.  Feels Virgel Bouquet is still helping.  She is requesting Prednisone.  Dr. Marchelle Gearing, please advise.  Thank you.  (examples of things to ask: : When did symptoms start? Fever? Cough? Productive? Color to sputum? More sputum than usual? Wheezing? Have you needed increased oxygen? Are you taking your respiratory medications? What over the counter measures have you tried?)  No Known  Allergies  Immunization History  Administered Date(s) Administered   Fluad Quad(high Dose 65+) 06/20/2022   Influenza Split 06/04/2013, 05/05/2014   Influenza Whole 05/28/2012   Influenza, High Dose Seasonal PF 06/05/2016, 06/04/2017, 05/20/2018, 05/06/2019   Influenza, Quadrivalent, Recombinant, Inj, Pf 05/25/2021   Influenza,inj,Quad PF,6+ Mos 06/05/2015   Influenza,trivalent, recombinat, inj, PF 06/19/2023   Moderna Sars-Covid-2 Vaccination 11/10/2019, 12/08/2019, 06/30/2020, 01/11/2021, 08/02/2021   PNEUMOCOCCAL CONJUGATE-20 11/17/2020   Pfizer(Comirnaty)Fall Seasonal Vaccine 12 years and older 06/19/2023   Pneumococcal Conjugate-13 01/26/2015   Pneumococcal Polysaccharide-23 09/04/2005, 09/24/2012   Tdap 03/20/2012

## 2023-08-07 NOTE — Telephone Encounter (Signed)
   Likely COPD exacerbation  Plan - Please take prednisone 40 mg x1 day, then 30 mg x1 day, then 20 mg x1 day, then 10 mg x1 day, and then 5 mg x1 day and stop   - Take doxycycline 100mg  po twice daily x 5 days; take after meals and avoid sunlight    No Known Allergies

## 2023-08-07 NOTE — Telephone Encounter (Signed)
Patient checking on sick message. Patient phone number is [phone].

## 2023-08-08 MED ORDER — DOXYCYCLINE HYCLATE 100 MG PO TABS: 100.0000 mg | ORAL_TABLET | Freq: Two times a day (BID) | ORAL | 0 refills | Status: DC

## 2023-08-08 MED ORDER — PREDNISONE 10 MG PO TABS: ORAL_TABLET | ORAL | 0 refills | Status: DC

## 2023-08-08 NOTE — Telephone Encounter (Signed)
Patient checking on sick message. Patient phone number is [phone].

## 2023-08-08 NOTE — Telephone Encounter (Signed)
I called and spoke with the pt and notified  of response per MR  She verbalized understanding  Nothing further needed  Rxs sent to pharm

## 2023-09-10 ENCOUNTER — Telehealth: Payer: Self-pay | Admitting: Internal Medicine

## 2023-09-10 DIAGNOSIS — R911 Solitary pulmonary nodule: Secondary | ICD-10-CM

## 2023-09-10 DIAGNOSIS — J439 Emphysema, unspecified: Secondary | ICD-10-CM

## 2023-09-10 DIAGNOSIS — Z836 Family history of other diseases of the respiratory system: Secondary | ICD-10-CM

## 2023-09-10 DIAGNOSIS — J849 Interstitial pulmonary disease, unspecified: Secondary | ICD-10-CM

## 2023-09-10 DIAGNOSIS — J8410 Pulmonary fibrosis, unspecified: Secondary | ICD-10-CM

## 2023-09-10 NOTE — Telephone Encounter (Signed)
 Patient would like to know if she needs another CT scan this year. Please call and advise.

## 2023-09-11 NOTE — Telephone Encounter (Signed)
 Please order CT Scan if needed. Last ov 07/2023.  Plan -Continue supportive care - check spirometry and dlco in 3 months  - do HRCT in  3 months (last JAn 2024)     RLL lung ndoule - dec 2023 =- sstable x 10 days   - stable x 10 years   Plan  - no  further followup

## 2023-09-13 NOTE — Telephone Encounter (Signed)
 Ok I think my dc instructions were confusing. My apologies. However  LAST CT was dec 2023. So , good idea to get a HRCT in Jan 2025 for ILD some 2 weeks before her OV, HAve odered

## 2023-09-13 NOTE — Telephone Encounter (Signed)
 HRCT scheduled for 09/26/23. NFN

## 2023-09-26 ENCOUNTER — Other Ambulatory Visit: Payer: Medicare Other

## 2023-09-28 ENCOUNTER — Ambulatory Visit (HOSPITAL_BASED_OUTPATIENT_CLINIC_OR_DEPARTMENT_OTHER)
Admission: RE | Admit: 2023-09-28 | Discharge: 2023-09-28 | Disposition: A | Discharge status: Home / Self Care | Payer: Medicare Other | Source: Ambulatory Visit | Attending: Internal Medicine | Admitting: Internal Medicine

## 2023-09-28 DIAGNOSIS — Z836 Family history of other diseases of the respiratory system: Secondary | ICD-10-CM | POA: Insufficient documentation

## 2023-09-28 DIAGNOSIS — R918 Other nonspecific abnormal finding of lung field: Secondary | ICD-10-CM | POA: Diagnosis not present

## 2023-09-28 DIAGNOSIS — J439 Emphysema, unspecified: Secondary | ICD-10-CM | POA: Diagnosis not present

## 2023-09-28 DIAGNOSIS — J849 Interstitial pulmonary disease, unspecified: Secondary | ICD-10-CM | POA: Insufficient documentation

## 2023-09-28 DIAGNOSIS — R911 Solitary pulmonary nodule: Secondary | ICD-10-CM | POA: Insufficient documentation

## 2023-09-28 DIAGNOSIS — J8410 Pulmonary fibrosis, unspecified: Secondary | ICD-10-CM | POA: Diagnosis not present

## 2023-09-28 DIAGNOSIS — J432 Centrilobular emphysema: Secondary | ICD-10-CM | POA: Diagnosis not present

## 2023-10-17 ENCOUNTER — Telehealth (HOSPITAL_COMMUNITY): Payer: Self-pay | Admitting: Pharmacy Technician

## 2023-10-17 ENCOUNTER — Other Ambulatory Visit (HOSPITAL_COMMUNITY): Payer: Self-pay

## 2023-10-17 ENCOUNTER — Emergency Department (HOSPITAL_BASED_OUTPATIENT_CLINIC_OR_DEPARTMENT_OTHER): Payer: Medicare Other | Admitting: Radiology

## 2023-10-17 ENCOUNTER — Inpatient Hospital Stay (HOSPITAL_BASED_OUTPATIENT_CLINIC_OR_DEPARTMENT_OTHER)
Admission: EM | Admit: 2023-10-17 | Discharge: 2023-10-23 | DRG: 291 | Disposition: A | Discharge status: Home / Self Care | Payer: Medicare Other | Attending: Student | Admitting: Student

## 2023-10-17 ENCOUNTER — Other Ambulatory Visit: Payer: Self-pay

## 2023-10-17 ENCOUNTER — Encounter (HOSPITAL_BASED_OUTPATIENT_CLINIC_OR_DEPARTMENT_OTHER): Payer: Self-pay | Admitting: Emergency Medicine

## 2023-10-17 DIAGNOSIS — J439 Emphysema, unspecified: Secondary | ICD-10-CM | POA: Diagnosis not present

## 2023-10-17 DIAGNOSIS — J441 Chronic obstructive pulmonary disease with (acute) exacerbation: Secondary | ICD-10-CM | POA: Diagnosis present

## 2023-10-17 DIAGNOSIS — K219 Gastro-esophageal reflux disease without esophagitis: Secondary | ICD-10-CM | POA: Diagnosis not present

## 2023-10-17 DIAGNOSIS — I10 Essential (primary) hypertension: Secondary | ICD-10-CM | POA: Diagnosis not present

## 2023-10-17 DIAGNOSIS — Z1152 Encounter for screening for COVID-19: Secondary | ICD-10-CM | POA: Diagnosis not present

## 2023-10-17 DIAGNOSIS — J810 Acute pulmonary edema: Secondary | ICD-10-CM

## 2023-10-17 DIAGNOSIS — R0602 Shortness of breath: Secondary | ICD-10-CM | POA: Diagnosis not present

## 2023-10-17 DIAGNOSIS — I272 Pulmonary hypertension, unspecified: Secondary | ICD-10-CM | POA: Diagnosis present

## 2023-10-17 DIAGNOSIS — I4891 Unspecified atrial fibrillation: Principal | ICD-10-CM | POA: Diagnosis present

## 2023-10-17 DIAGNOSIS — Z825 Family history of asthma and other chronic lower respiratory diseases: Secondary | ICD-10-CM | POA: Diagnosis not present

## 2023-10-17 DIAGNOSIS — J9601 Acute respiratory failure with hypoxia: Secondary | ICD-10-CM | POA: Diagnosis not present

## 2023-10-17 DIAGNOSIS — I2510 Atherosclerotic heart disease of native coronary artery without angina pectoris: Secondary | ICD-10-CM | POA: Diagnosis not present

## 2023-10-17 DIAGNOSIS — E782 Mixed hyperlipidemia: Secondary | ICD-10-CM | POA: Diagnosis not present

## 2023-10-17 DIAGNOSIS — Z79899 Other long term (current) drug therapy: Secondary | ICD-10-CM | POA: Diagnosis not present

## 2023-10-17 DIAGNOSIS — Z841 Family history of disorders of kidney and ureter: Secondary | ICD-10-CM

## 2023-10-17 DIAGNOSIS — T460X5A Adverse effect of cardiac-stimulant glycosides and drugs of similar action, initial encounter: Secondary | ICD-10-CM | POA: Diagnosis not present

## 2023-10-17 DIAGNOSIS — J8410 Pulmonary fibrosis, unspecified: Secondary | ICD-10-CM | POA: Diagnosis not present

## 2023-10-17 DIAGNOSIS — J984 Other disorders of lung: Secondary | ICD-10-CM | POA: Diagnosis not present

## 2023-10-17 DIAGNOSIS — E785 Hyperlipidemia, unspecified: Secondary | ICD-10-CM | POA: Diagnosis not present

## 2023-10-17 DIAGNOSIS — I517 Cardiomegaly: Secondary | ICD-10-CM | POA: Diagnosis not present

## 2023-10-17 DIAGNOSIS — G4700 Insomnia, unspecified: Secondary | ICD-10-CM | POA: Diagnosis not present

## 2023-10-17 DIAGNOSIS — N179 Acute kidney failure, unspecified: Secondary | ICD-10-CM | POA: Diagnosis not present

## 2023-10-17 DIAGNOSIS — Z9861 Coronary angioplasty status: Secondary | ICD-10-CM

## 2023-10-17 DIAGNOSIS — I110 Hypertensive heart disease with heart failure: Principal | ICD-10-CM | POA: Diagnosis present

## 2023-10-17 DIAGNOSIS — J849 Interstitial pulmonary disease, unspecified: Secondary | ICD-10-CM

## 2023-10-17 DIAGNOSIS — I340 Nonrheumatic mitral (valve) insufficiency: Secondary | ICD-10-CM | POA: Diagnosis not present

## 2023-10-17 DIAGNOSIS — I5021 Acute systolic (congestive) heart failure: Secondary | ICD-10-CM

## 2023-10-17 DIAGNOSIS — Z87891 Personal history of nicotine dependence: Secondary | ICD-10-CM

## 2023-10-17 DIAGNOSIS — I5043 Acute on chronic combined systolic (congestive) and diastolic (congestive) heart failure: Secondary | ICD-10-CM | POA: Diagnosis not present

## 2023-10-17 DIAGNOSIS — Z7951 Long term (current) use of inhaled steroids: Secondary | ICD-10-CM | POA: Diagnosis not present

## 2023-10-17 DIAGNOSIS — J449 Chronic obstructive pulmonary disease, unspecified: Secondary | ICD-10-CM | POA: Diagnosis not present

## 2023-10-17 DIAGNOSIS — L539 Erythematous condition, unspecified: Secondary | ICD-10-CM | POA: Diagnosis not present

## 2023-10-17 DIAGNOSIS — R918 Other nonspecific abnormal finding of lung field: Secondary | ICD-10-CM | POA: Diagnosis not present

## 2023-10-17 DIAGNOSIS — T502X5A Adverse effect of carbonic-anhydrase inhibitors, benzothiadiazides and other diuretics, initial encounter: Secondary | ICD-10-CM | POA: Diagnosis not present

## 2023-10-17 DIAGNOSIS — J90 Pleural effusion, not elsewhere classified: Secondary | ICD-10-CM | POA: Diagnosis not present

## 2023-10-17 DIAGNOSIS — Z801 Family history of malignant neoplasm of trachea, bronchus and lung: Secondary | ICD-10-CM

## 2023-10-17 DIAGNOSIS — Z8249 Family history of ischemic heart disease and other diseases of the circulatory system: Secondary | ICD-10-CM

## 2023-10-17 DIAGNOSIS — I4819 Other persistent atrial fibrillation: Secondary | ICD-10-CM | POA: Diagnosis present

## 2023-10-17 HISTORY — DX: Chronic obstructive pulmonary disease, unspecified: J44.9

## 2023-10-17 LAB — CBC WITH DIFFERENTIAL/PLATELET
Abs Immature Granulocytes: 0.02 10*3/uL (ref 0.00–0.07)
Basophils Absolute: 0 10*3/uL (ref 0.0–0.1)
Basophils Relative: 1 %
Eosinophils Absolute: 0.2 10*3/uL (ref 0.0–0.5)
Eosinophils Relative: 3 %
HCT: 39.7 % (ref 36.0–46.0)
Hemoglobin: 13.6 g/dL (ref 12.0–15.0)
Immature Granulocytes: 0 %
Lymphocytes Relative: 21 %
Lymphs Abs: 1.2 10*3/uL (ref 0.7–4.0)
MCH: 33.8 pg (ref 26.0–34.0)
MCHC: 34.3 g/dL (ref 30.0–36.0)
MCV: 98.8 fL (ref 80.0–100.0)
Monocytes Absolute: 0.8 10*3/uL (ref 0.1–1.0)
Monocytes Relative: 13 %
Neutro Abs: 3.6 10*3/uL (ref 1.7–7.7)
Neutrophils Relative %: 62 %
Platelets: 264 10*3/uL (ref 150–400)
RBC: 4.02 MIL/uL (ref 3.87–5.11)
RDW: 14.2 % (ref 11.5–15.5)
WBC: 5.9 10*3/uL (ref 4.0–10.5)
nRBC: 0 % (ref 0.0–0.2)

## 2023-10-17 LAB — RESP PANEL BY RT-PCR (RSV, FLU A&B, COVID)  RVPGX2
Influenza A by PCR: NEGATIVE
Influenza B by PCR: NEGATIVE
Resp Syncytial Virus by PCR: NEGATIVE
SARS Coronavirus 2 by RT PCR: NEGATIVE

## 2023-10-17 LAB — TROPONIN I (HIGH SENSITIVITY)
Troponin I (High Sensitivity): 13 ng/L (ref <18)
Troponin I (High Sensitivity): 12 ng/L (ref <18)

## 2023-10-17 LAB — BASIC METABOLIC PANEL
Anion gap: 10 (ref 5–15)
BUN: 19 mg/dL (ref 8–23)
CO2: 23 mmol/L (ref 22–32)
Calcium: 9.3 mg/dL (ref 8.9–10.3)
Chloride: 108 mmol/L (ref 98–111)
Creatinine, Ser: 0.81 mg/dL (ref 0.44–1.00)
GFR, Estimated: >60 mL/min (ref >60)
Glucose, Bld: 111 mg/dL — ABNORMAL HIGH (ref 70–99)
Potassium: 4 mmol/L (ref 3.5–5.1)
Sodium: 141 mmol/L (ref 135–145)

## 2023-10-17 LAB — BRAIN NATRIURETIC PEPTIDE: B Natriuretic Peptide: 206.7 pg/mL — ABNORMAL HIGH (ref 0.0–100.0)

## 2023-10-17 MED ORDER — ALBUTEROL SULFATE (2.5 MG/3ML) 0.083% IN NEBU
2.5000 mg | INHALATION_SOLUTION | RESPIRATORY_TRACT | Status: DC | PRN
  Administered 2023-10-18: 2.5 mg via RESPIRATORY_TRACT
  Filled 2023-10-17: qty 3

## 2023-10-17 MED ORDER — ARFORMOTEROL TARTRATE 15 MCG/2ML IN NEBU
15.0000 ug | INHALATION_SOLUTION | Freq: Two times a day (BID) | RESPIRATORY_TRACT | Status: DC
  Administered 2023-10-17 – 2023-10-23 (×12): 15 ug via RESPIRATORY_TRACT
  Filled 2023-10-17 (×12): qty 2

## 2023-10-17 MED ORDER — FUROSEMIDE 10 MG/ML IJ SOLN
20.0000 mg | Freq: Once | INTRAMUSCULAR | Status: AC
  Administered 2023-10-17: 20 mg via INTRAVENOUS
  Filled 2023-10-17: qty 2

## 2023-10-17 MED ORDER — HEPARIN BOLUS VIA INFUSION
4000.0000 [IU] | Freq: Once | INTRAVENOUS | Status: AC
  Administered 2023-10-17: 4000 [IU] via INTRAVENOUS

## 2023-10-17 MED ORDER — PREDNISONE 20 MG PO TABS
40.0000 mg | ORAL_TABLET | Freq: Every day | ORAL | Status: AC
  Administered 2023-10-18 – 2023-10-20 (×3): 40 mg via ORAL
  Filled 2023-10-17 (×4): qty 2

## 2023-10-17 MED ORDER — LEVALBUTEROL HCL 0.63 MG/3ML IN NEBU
0.6300 mg | INHALATION_SOLUTION | Freq: Three times a day (TID) | RESPIRATORY_TRACT | Status: DC
  Administered 2023-10-17: 0.63 mg via RESPIRATORY_TRACT
  Filled 2023-10-17 (×2): qty 3

## 2023-10-17 MED ORDER — DILTIAZEM HCL 60 MG PO TABS
60.0000 mg | ORAL_TABLET | Freq: Three times a day (TID) | ORAL | Status: DC
Start: 2023-10-17 — End: 2023-10-17

## 2023-10-17 MED ORDER — IPRATROPIUM-ALBUTEROL 0.5-2.5 (3) MG/3ML IN SOLN: 3.0000 mL | Freq: Once | RESPIRATORY_TRACT | Status: AC

## 2023-10-17 MED ORDER — IPRATROPIUM-ALBUTEROL 0.5-2.5 (3) MG/3ML IN SOLN
RESPIRATORY_TRACT | Status: AC
  Administered 2023-10-17: 3 mL via RESPIRATORY_TRACT
  Filled 2023-10-17: qty 3

## 2023-10-17 MED ORDER — GUAIFENESIN ER 600 MG PO TB12
600.0000 mg | ORAL_TABLET | Freq: Two times a day (BID) | ORAL | Status: DC
  Administered 2023-10-17 – 2023-10-23 (×12): 600 mg via ORAL
  Filled 2023-10-17 (×12): qty 1

## 2023-10-17 MED ORDER — IPRATROPIUM BROMIDE 0.02 % IN SOLN
0.5000 mg | Freq: Three times a day (TID) | RESPIRATORY_TRACT | Status: DC
  Administered 2023-10-17: 0.5 mg via RESPIRATORY_TRACT
  Filled 2023-10-17 (×2): qty 2.5

## 2023-10-17 MED ORDER — HEPARIN (PORCINE) 25000 UT/250ML-% IV SOLN
950.0000 [IU]/h | INTRAVENOUS | Status: DC
  Administered 2023-10-17: 950 [IU]/h via INTRAVENOUS
  Filled 2023-10-17: qty 250

## 2023-10-17 MED ORDER — ALBUTEROL SULFATE (2.5 MG/3ML) 0.083% IN NEBU: 2.5000 mg | INHALATION_SOLUTION | Freq: Once | RESPIRATORY_TRACT | Status: DC

## 2023-10-17 MED ORDER — DILTIAZEM HCL-DEXTROSE 125-5 MG/125ML-% IV SOLN (PREMIX)
5.0000 mg/h | INTRAVENOUS | Status: DC
  Administered 2023-10-17: 5 mg/h via INTRAVENOUS
  Administered 2023-10-18 (×2): 10 mg/h via INTRAVENOUS
  Filled 2023-10-17 (×3): qty 125

## 2023-10-17 MED ORDER — ALBUTEROL SULFATE HFA 108 (90 BASE) MCG/ACT IN AERS: 2.0000 | INHALATION_SPRAY | RESPIRATORY_TRACT | Status: DC | PRN

## 2023-10-17 MED ORDER — SODIUM CHLORIDE 0.9% FLUSH
3.0000 mL | Freq: Two times a day (BID) | INTRAVENOUS | Status: DC
  Administered 2023-10-17 – 2023-10-23 (×12): 3 mL via INTRAVENOUS

## 2023-10-17 MED ORDER — MAGNESIUM SULFATE 2 GM/50ML IV SOLN
2.0000 g | Freq: Once | INTRAVENOUS | Status: AC
  Administered 2023-10-17: 2 g via INTRAVENOUS
  Filled 2023-10-17: qty 50

## 2023-10-17 MED ORDER — POLYETHYLENE GLYCOL 3350 17 G PO PACK: 17.0000 g | PACK | Freq: Every day | ORAL | Status: DC | PRN

## 2023-10-17 MED ORDER — APIXABAN 5 MG PO TABS
5.0000 mg | ORAL_TABLET | Freq: Two times a day (BID) | ORAL | Status: DC
  Administered 2023-10-17 – 2023-10-23 (×12): 5 mg via ORAL
  Filled 2023-10-17 (×12): qty 1

## 2023-10-17 MED ORDER — METHYLPREDNISOLONE SODIUM SUCC 125 MG IJ SOLR
62.5000 mg | Freq: Once | INTRAMUSCULAR | Status: AC
  Administered 2023-10-17: 62.5 mg via INTRAVENOUS
  Filled 2023-10-17: qty 2

## 2023-10-17 MED ORDER — ONDANSETRON HCL 4 MG/2ML IJ SOLN: 4.0000 mg | Freq: Four times a day (QID) | INTRAMUSCULAR | Status: DC | PRN

## 2023-10-17 MED ORDER — SODIUM CHLORIDE 0.9 % IV BOLUS
1000.0000 mL | Freq: Once | INTRAVENOUS | Status: AC
  Administered 2023-10-17: 1000 mL via INTRAVENOUS

## 2023-10-17 MED ORDER — SODIUM CHLORIDE 0.9% FLUSH: 3.0000 mL | INTRAVENOUS | Status: DC | PRN

## 2023-10-17 MED ORDER — PREDNISONE 20 MG PO TABS
40.0000 mg | ORAL_TABLET | Freq: Every day | ORAL | Status: DC
Start: 2023-10-18 — End: 2023-10-17

## 2023-10-17 MED ORDER — ALBUTEROL SULFATE (2.5 MG/3ML) 0.083% IN NEBU
INHALATION_SOLUTION | RESPIRATORY_TRACT | Status: AC
  Filled 2023-10-17: qty 3

## 2023-10-17 MED ORDER — ALBUTEROL SULFATE (2.5 MG/3ML) 0.083% IN NEBU: 2.5000 mg | INHALATION_SOLUTION | RESPIRATORY_TRACT | Status: DC | PRN

## 2023-10-17 MED ORDER — ONDANSETRON HCL 4 MG PO TABS: 4.0000 mg | ORAL_TABLET | Freq: Four times a day (QID) | ORAL | Status: DC | PRN

## 2023-10-17 MED ORDER — PANTOPRAZOLE SODIUM 40 MG PO TBEC
40.0000 mg | DELAYED_RELEASE_TABLET | Freq: Every day | ORAL | Status: DC
  Administered 2023-10-18 – 2023-10-22 (×5): 40 mg via ORAL
  Filled 2023-10-17 (×5): qty 1

## 2023-10-17 MED ORDER — FUROSEMIDE 10 MG/ML IJ SOLN
40.0000 mg | Freq: Every day | INTRAMUSCULAR | Status: DC
  Administered 2023-10-18 – 2023-10-21 (×4): 40 mg via INTRAVENOUS
  Filled 2023-10-17 (×4): qty 4

## 2023-10-17 MED ORDER — REVEFENACIN 175 MCG/3ML IN SOLN
175.0000 ug | Freq: Every day | RESPIRATORY_TRACT | Status: DC
  Administered 2023-10-18 – 2023-10-23 (×6): 175 ug via RESPIRATORY_TRACT
  Filled 2023-10-17 (×5): qty 3

## 2023-10-17 MED ORDER — METOPROLOL TARTRATE 5 MG/5ML IV SOLN
5.0000 mg | INTRAVENOUS | Status: DC | PRN
Start: 2023-10-17 — End: 2023-10-19
  Administered 2023-10-17 (×2): 5 mg via INTRAVENOUS
  Filled 2023-10-17: qty 5

## 2023-10-17 MED ORDER — DILTIAZEM HCL 30 MG PO TABS
30.0000 mg | ORAL_TABLET | Freq: Three times a day (TID) | ORAL | Status: DC
  Administered 2023-10-17 – 2023-10-18 (×3): 30 mg via ORAL
  Filled 2023-10-17 (×4): qty 1

## 2023-10-17 MED ORDER — ACETAMINOPHEN 650 MG RE SUPP: 650.0000 mg | Freq: Four times a day (QID) | RECTAL | Status: DC | PRN

## 2023-10-17 MED ORDER — SODIUM CHLORIDE 0.9 % IV SOLN: 250.0000 mL | INTRAVENOUS | Status: AC | PRN

## 2023-10-17 MED ORDER — METOPROLOL TARTRATE 5 MG/5ML IV SOLN
5.0000 mg | Freq: Once | INTRAVENOUS | Status: DC
  Filled 2023-10-17: qty 5

## 2023-10-17 MED ORDER — BUDESONIDE 0.25 MG/2ML IN SUSP
0.2500 mg | Freq: Two times a day (BID) | RESPIRATORY_TRACT | Status: DC
  Administered 2023-10-17 – 2023-10-23 (×12): 0.25 mg via RESPIRATORY_TRACT
  Filled 2023-10-17 (×12): qty 2

## 2023-10-17 MED ORDER — ACETAMINOPHEN 325 MG PO TABS: 650.0000 mg | ORAL_TABLET | Freq: Four times a day (QID) | ORAL | Status: DC | PRN

## 2023-10-17 NOTE — ED Triage Notes (Signed)
Pt caox4 ambulatory c/o SOB and productive cough x1 week. Pt denies N/V/D, fever, pain. PMH COPD.

## 2023-10-17 NOTE — H&P (Signed)
History and Physical    Patient: Erin Li JXB:[id number] DOB: Apr 11, 1941 DOA: 10/17/2023 DOS: the patient was seen and examined on 10/17/2023 PCP: [name], DO  Patient coming from: Home  Chief Complaint:  Chief Complaint  Patient presents with   Shortness of Breath   HPI: Erin Li is a 83 y.o. female with medical history significant of COPD, HTN, HLD, CAD who presented to [facility] complaining of shortness of breath/cough.  History obtained-for the past 1 week-patient has not been at her baseline-she initially started having a dry cough-that gradually became productive with gray-colored sputum.  She then started experiencing shortness of breath-which is exclusively on exertion-and completely resolves at rest.  She does acknowledge some swelling in her legs.  No history of paroxysmal nocturnal dyspnea.  However she has had repeated coughing spells-mostly at night.  She also has had intermittent palpitations over the past several days.  There is no fever.  No chest pain.    Since the symptoms were persistent-she she subsequently presented to [facility] she was found to have A-fib with RVR-and possible new onset CHF exacerbation +/- COPD exacerbation.   Patient was given steroids/bronchodilators/started on IV Cardizem/IV heparin and subsequently admitted to the hospitalist service.  No fever No headache No chest pain No abdominal pain No nausea or vomiting No diarrhea No dysuria  Review of Systems: As mentioned in the history of present illness. All other systems reviewed and are negative. Past Medical History:  Diagnosis Date   COPD (chronic obstructive pulmonary disease) (HCC)    Coronary artery disease    Diverticulosis    HTN (hypertension)    Lung nodule    Past Surgical History:  Procedure Laterality Date   CARDIAC CATHETERIZATION     CORONARY ANGIOPLASTY     FRACTURE SURGERY     INTRAMEDULLARY (IM) NAIL INTERTROCHANTERIC Right  02/26/2022   Procedure: INTRAMEDULLARY (IM) NAIL INTERTROCHANTRIC;  Surgeon: Bjorn Pippin, MD;  Location: [facility] OR;  Service: Orthopedics;  Laterality: Right;   TONSILLECTOMY AND ADENOIDECTOMY     Social History:  reports that she quit smoking about 11 years ago. Her smoking use included cigarettes. She started smoking about 61 years ago. She has a 37.5 pack-year smoking history. She has never used smokeless tobacco. She reports current alcohol use. No history on file for drug use.  No Known Allergies  Family History  Problem Relation Age of Onset   Heart attack Father    Kidney disease Father    COPD Mother    Lung cancer Brother 33   Pulmonary fibrosis Son        had lung transplant    Prior to Admission medications   Medication Sig Start Date End Date Taking? Authorizing Provider  Ascorbic Acid (VITAMIN C) 1000 MG tablet Take 1,000 mg by mouth daily.   Yes [provider]  b complex vitamins tablet Take 2 tablets by mouth daily.   Yes [provider]  calcium-vitamin D 250-100 MG-UNIT per tablet Take 1 tablet by mouth daily.    Yes [provider]  diltiazem (CARDIZEM) 60 MG tablet Take 1 tablet (60 mg total) by mouth 3 (three) times daily. Patient taking differently: Take 60 mg by mouth 2 (two) times daily. 02/28/22  Yes Pokhrel, Laxman, MD  diphenhydrAMINE (BENADRYL) 25 MG tablet Take 50 mg by mouth at bedtime.   Yes [provider]  docusate sodium (COLACE) 100 MG capsule Take 1 capsule (100 mg total) by mouth 2 (two)  times daily. 02/28/22  Yes Pokhrel, Laxman, MD  fish oil-omega-3 fatty acids 1000 MG capsule Take 1,000 mg by mouth daily.   Yes [provider]  fluticasone furoate-vilanterol (BREO ELLIPTA) 100-25 MCG/ACT AEPB inhale ONE PUFF into THE lungs EVERY DAY 04/03/23  Yes Kalman Shan, MD  glucosamine-chondroitin 500-400 MG tablet Take 1 tablet by mouth every morning.   Yes [provider]  LUTEIN PO Take 1 tablet by  mouth daily.   Yes [provider]  magnesium 30 MG tablet Take 30 mg by mouth daily in the afternoon.   Yes [provider]  Melatonin 10 MG TABS Take 10 mg by mouth at bedtime.   Yes [provider]  omeprazole (PRILOSEC) 20 MG capsule Take 20 mg by mouth daily.   Yes [provider]  OVER THE COUNTER MEDICATION Take 2 mg by mouth daily in the afternoon. Biotin 2 mg   Yes [provider]  OVER THE COUNTER MEDICATION Place 1 drop into both eyes daily as needed (dry eye). OTC eye drop   Yes [provider]  PAPAYA PO Take 3 tablets by mouth daily as needed (heartburn).   Yes [provider]  selenium 50 MCG TABS Take 50 mcg by mouth daily.   Yes [provider]  tiotropium (SPIRIVA) 18 MCG inhalation capsule Place 18 mcg into inhaler and inhale daily.   Yes [provider]  vitamin B-12 (CYANOCOBALAMIN) 1000 MCG tablet Take 1,000 mcg by mouth daily.   Yes [provider]  vitamin E 200 UNIT capsule Take 200 Units by mouth daily.   Yes [provider]  albuterol (VENTOLIN HFA) 108 (90 Base) MCG/ACT inhaler Inhale 2 puffs into the lungs every 6 (six) hours as needed for wheezing or shortness of breath. Patient not taking: Reported on 10/17/2023 02/28/22   Joycelyn Das, MD  amoxicillin-clavulanate (AUGMENTIN) 875-125 MG tablet Take 1 tablet by mouth 2 (two) times daily. Patient not taking: Reported on 10/17/2023 11/28/22   Glenford Bayley, NP  amoxicillin-clavulanate (AUGMENTIN) 875-125 MG tablet Take 1 tablet by mouth 2 (two) times daily. Patient not taking: Reported on 10/17/2023 03/29/23   Kalman Shan, MD  atorvastatin (LIPITOR) 10 MG tablet TAKE 1 TABLET BY MOUTH  EVERY EVENING AFTER DINNER Patient not taking: Reported on 10/17/2023 07/15/19   Yates Decamp, MD  doxycycline (VIBRA-TABS) 100 MG tablet Take 1 tablet (100 mg total) by mouth 2 (two) times daily. Patient not taking: Reported on  10/17/2023 05/08/23   Kalman Shan, MD  doxycycline (VIBRA-TABS) 100 MG tablet Take 1 tablet (100 mg total) by mouth 2 (two) times daily. Patient not taking: Reported on 10/17/2023 08/08/23   Kalman Shan, MD  oxyCODONE (OXY IR/ROXICODONE) 5 MG immediate release tablet Take 0.5 tablets (2.5 mg total) by mouth every 6 (six) hours as needed for severe pain. Patient not taking: Reported on 10/17/2023 02/11/23   Renne Crigler, PA-C  predniSONE (DELTASONE) 10 MG tablet 4 x 1 day, 3 x 1 day, 2 x 1 day, 1 x 1 day, 1/2 x 1 day then stop Patient not taking: Reported on 10/17/2023 08/08/23   Kalman Shan, MD  trimethoprim-polymyxin b (POLYTRIM) ophthalmic solution Place 2 drops into both eyes every 4 (four) hours. Patient not taking: Reported on 10/17/2023 04/05/23   Teressa Lower, [place]    Physical Exam: Vitals:   10/17/23 1400 10/17/23 1415 10/17/23 1430 10/17/23 1535  BP: (!) 85/60 (!) 108/48 (!) 87/70 128/76  Pulse: 64 [name])  37 65 80  Resp: 17 17 12 12   Temp:    97.7 F (36.5 C)  TempSrc:    Oral  SpO2: 94% 96% 95% 93%  Weight:      Height:       Gen Exam:Alert awake-not in any distress HEENT:atraumatic, normocephalic Chest: Moving air well-only if scattered rhonchi-some bibasilar rales. CVS:S1S2 regular Abdomen:soft non tender, non distended Extremities:+ edema Neurology: Non focal Skin: no rash  Data Reviewed:    Latest Ref Rng & Units 10/17/2023    8:05 AM 09/22/2022    2:13 PM 02/27/2022    9:22 AM  CBC  WBC 4.0 - 10.5 K/uL 5.9  6.0  9.1   Hemoglobin 12.0 - 15.0 g/dL 09.8  11.9  14.7   Hematocrit 36.0 - 46.0 % 39.7  42.9  33.8   Platelets 150 - 400 K/uL 264  310.0  224        Latest Ref Rng & Units 10/17/2023    8:01 AM 09/22/2022    2:13 PM 02/26/2022    3:48 AM  BMP  Glucose 70 - 99 mg/dL 829  562    BUN 8 - 23 mg/dL 19  15    Creatinine 1.30 - 1.00 mg/dL 8.65  7.84  6.96   Sodium 135 - 145 mmol/L 141  141    Potassium 3.5 - 5.1 mmol/L 4.0  3.8    Chloride 98 -  111 mmol/L 108  106    CO2 22 - 32 mmol/L 23  26    Calcium 8.9 - 10.3 mg/dL 9.3  29.5       Assessment and Plan: A-fib with RVR Continue Cardizem infusion-recheck twelve-lead EKG-has on exam-her heart rate is now regular--if he is in back in sinus rhythm-will switch to oral Cardizem (which she normally takes at home).  Will stop IV heparin and transition to Eliquis Check echo TSH with a.m. labs.   Close telemetry monitoring.    Acute congestive heart failure-possibly new onset diastolic heart failure Volume overloaded-with 1-2+ pitting edema and new onset of exertional dyspnea. Check updated echocardiogram to confirm this is diastolic IV Lasix Follow electrolytes/weights  ?COPD exacerbation Unclear whether she had any significant COPD exacerbation-her history more suggestive of CHF  In any event-she has been given steroids/bronchodilators-her exam is significantly better-she is comfortable-will plan a short steroid taper-and scheduled bronchodilators.  Interstitial lung disease Supportive care Will be on steroids/bronchodilators CRP with a.m. labs.  HTN On Cardizem at home to be continued-see above.  HLD Statin  GERD PPI   Advance Care Planning:   Code Status: Full Code   Consults: None  Family Communication: Spouse at bedside  Severity of Illness: The appropriate patient status for this patient is INPATIENT. Inpatient status is judged to be reasonable and necessary in order to provide the required intensity of service to ensure the patient's safety. The patient's presenting symptoms, physical exam findings, and initial radiographic and laboratory data in the context of their chronic comorbidities is felt to place them at high risk for further clinical deterioration. Furthermore, it is not anticipated that the patient will be medically stable for discharge from the hospital within 2 midnights of admission.   * I certify that at the point of admission it is my clinical  judgment that the patient will require inpatient hospital care spanning beyond 2 midnights from the point of admission due to high intensity of service, high risk for further deterioration and high frequency of surveillance required.*  Author: Jeoffrey Massed, MD 10/17/2023 4:26 PM  For on call review [email or web address].

## 2023-10-17 NOTE — ED Notes (Signed)
Patient transported to X-ray

## 2023-10-17 NOTE — Telephone Encounter (Signed)
Patient Product/process development scientist completed.    The patient is insured through [facility]. Patient has Medicare and is not eligible for a copay card, but may be able to apply for patient assistance or Medicare RX Payment Plan (Patient Must reach out to their plan, if eligible for payment plan), if available.    Ran test claim for Eliquis 5 mg and the current 30 day co-pay is $47.00.  Ran test claim for Xarelto 20 mg and the current 30 day co-pay is $47.00.  This test claim was processed through [facility]- copay amounts may vary at other pharmacies due to pharmacy/plan contracts, or as the patient moves through the different stages of their insurance plan.     Roland Earl, CPHT Pharmacy Technician III Certified Patient Advocate [facility] Patient Advocate Team Direct Number: [phone]  Fax: [phone]

## 2023-10-17 NOTE — ED Provider Notes (Signed)
[place] EMERGENCY DEPARTMENT AT [facility] Provider Note  CSN: [id number] Arrival date & time: 10/17/23 0749  Chief Complaint(s) Shortness of Breath  HPI Erin Li is Li 83 y.o. female with past medical history as below, significant for CAD, hypertension, COPD not on home oxygen, former smoker who presents to the ED with complaint of cough, dyspnea, palpitations  She has been feeling unwell over the past week, worsening productive cough with Erin Li sputum, worsening exertional dyspnea.  Chest tightness.  No fevers or chills.  She also reports intermittent palpitations over the past 3 to 5 days.  Has history of atrial fibrillation.  She used to follow with Dr. Jacinto Halim but has not seen in many years.  No sick contacts recent travel.  No history of DVT or PE.  Reports compliance with home medications.  No nausea or vomiting.  Past Medical History Past Medical History:  Diagnosis Date   COPD (chronic obstructive pulmonary disease) (HCC)    Coronary artery disease    Diverticulosis    HTN (hypertension)    Lung nodule    Patient Active Problem List   Diagnosis Date Noted   New onset atrial fibrillation (HCC) 10/17/2023   HLD (hyperlipidemia) 02/26/2022   Essential hypertension 02/26/2022   Femur fracture, right (HCC) 02/25/2022   Pain in right foot 12/10/2019   Elevated C-reactive protein (CRP) 01/18/2017   COPD (chronic obstructive pulmonary disease) (HCC) 09/25/2012   Lung nodule 07/14/2012   Hx of smoking 07/14/2012   Home Medication(s) Prior to Admission medications   Medication Sig Start Date End Date Taking? Authorizing Provider  albuterol (VENTOLIN HFA) 108 (90 Base) MCG/ACT inhaler Inhale 2 puffs into the lungs every 6 (six) hours as needed for wheezing or shortness of breath. 02/28/22   Pokhrel, Rebekah Chesterfield, MD  amoxicillin-clavulanate (AUGMENTIN) 875-125 MG tablet Take 1 tablet by mouth 2 (two) times Li. 11/28/22   Glenford Bayley, NP   amoxicillin-clavulanate (AUGMENTIN) 875-125 MG tablet Take 1 tablet by mouth 2 (two) times Li. 03/29/23   Kalman Shan, MD  Ascorbic Acid (VITAMIN C) 1000 MG tablet Take 1,000 mg by mouth Li.    [provider]  atorvastatin (LIPITOR) 10 MG tablet TAKE 1 TABLET BY MOUTH  EVERY EVENING AFTER DINNER 07/15/19   Yates Decamp, MD  b complex vitamins tablet Take 2 tablets by mouth Li.    [provider]  calcium-vitamin D 250-100 MG-UNIT per tablet Take 1 tablet by mouth Li.     [provider]  diltiazem (CARDIZEM) 60 MG tablet Take 1 tablet (60 mg total) by mouth 3 (three) times Li. 02/28/22   Pokhrel, Rebekah Chesterfield, MD  diphenhydrAMINE (BENADRYL) 25 MG tablet Take 50 mg by mouth at bedtime.    [provider]  docusate sodium (COLACE) 100 MG capsule Take 1 capsule (100 mg total) by mouth 2 (two) times Li. 02/28/22   Pokhrel, Rebekah Chesterfield, MD  doxycycline (VIBRA-TABS) 100 MG tablet Take 1 tablet (100 mg total) by mouth 2 (two) times Li. 05/08/23   Kalman Shan, MD  doxycycline (VIBRA-TABS) 100 MG tablet Take 1 tablet (100 mg total) by mouth 2 (two) times Li. 08/08/23   Kalman Shan, MD  fish oil-omega-3 fatty acids 1000 MG capsule Take 1,000 mg by mouth Li.    [provider]  fluticasone furoate-vilanterol (BREO ELLIPTA) 100-25 MCG/ACT AEPB inhale ONE PUFF into THE lungs EVERY DAY 04/03/23   Kalman Shan, MD  glucosamine-chondroitin 500-400 MG tablet Take 1 tablet by mouth every  morning.    [provider]  LUTEIN PO Take 1 tablet by mouth Li.    [provider]  magnesium 30 MG tablet Take 30 mg by mouth Li in the afternoon.    [provider]  Melatonin 10 MG TABS Take 10 mg by mouth at bedtime.    [provider]  OVER THE COUNTER MEDICATION Take 2 mg by mouth Li in the afternoon. Biotin 2 mg    [provider]  OVER THE COUNTER MEDICATION Place 1 drop into both eyes Li as  needed (dry eye). OTC eye drop    [provider]  oxyCODONE (OXY IR/ROXICODONE) 5 MG immediate release tablet Take 0.5 tablets (2.5 mg total) by mouth every 6 (six) hours as needed for severe pain. 02/11/23   Renne Crigler, PA-C  PAPAYA PO Take 3 tablets by mouth Li as needed (heartburn).    [provider]  predniSONE (DELTASONE) 10 MG tablet 4 x 1 day, 3 x 1 day, 2 x 1 day, 1 x 1 day, 1/2 x 1 day then stop 08/08/23   Kalman Shan, MD  selenium 50 MCG TABS Take 50 mcg by mouth Li.    [provider]  senna (SENOKOT) 8.6 MG tablet Take 2 tablets by mouth Li.    [provider]  trimethoprim-polymyxin b (POLYTRIM) ophthalmic solution Place 2 drops into both eyes every 4 (four) hours. 04/05/23   Honor Loh M, PA-C  vitamin B-12 (CYANOCOBALAMIN) 1000 MCG tablet Take 1,000 mcg by mouth Li.    [provider]  vitamin E 200 UNIT capsule Take 200 Units by mouth Li.    [provider]                                                                                                                                    Past Surgical History Past Surgical History:  Procedure Laterality Date   CARDIAC CATHETERIZATION     CORONARY ANGIOPLASTY     INTRAMEDULLARY (IM) NAIL INTERTROCHANTERIC Right 02/26/2022   Procedure: INTRAMEDULLARY (IM) NAIL INTERTROCHANTRIC;  Surgeon: Bjorn Pippin, MD;  Location: [facility] OR;  Service: Orthopedics;  Laterality: Right;   TONSILLECTOMY AND ADENOIDECTOMY     Family History Family History  Problem Relation Age of Onset   Heart attack Father    Kidney disease Father    COPD Mother    Lung cancer Brother 43   Pulmonary fibrosis Son        had lung transplant    Social History Social History   Tobacco Use   Smoking status: Former    Current packs/day: 0.00    Average packs/day: 0.8 packs/day for 50.0 years (37.5 ttl pk-yrs)    Types: Cigarettes    Start date: 11/10/1961    Quit date: 11/11/2011     Years since quitting: 11.9   Smokeless tobacco: Never   Tobacco comments:  Encouraged to remain smoke free  Substance Use Topics   Alcohol use: Yes   Allergies Patient has no known allergies.  Review of Systems Li thorough review of systems was obtained and all systems are negative except as noted in the HPI and PMH.   Physical Exam Vital Signs  I have reviewed the triage vital signs BP 114/78   Pulse (!) 59   Temp 97.8 F (36.6 C) (Oral)   Resp 20   Ht 5\' 5"  (1.651 m)   Wt 71.1 kg   LMP 09/04/1988 (Approximate)   SpO2 96%   BMI 26.08 kg/m  Physical Exam Vitals and nursing note reviewed.  Constitutional:      General: She is not in acute distress.    Appearance: Normal appearance. She is well-developed.  HENT:     Head: Normocephalic and atraumatic.     Right Ear: External ear normal.     Left Ear: External ear normal.     Nose: Nose normal.     Mouth/Throat:     Mouth: Mucous membranes are moist.  Eyes:     General: No scleral icterus.       Right eye: No discharge.        Left eye: No discharge.  Cardiovascular:     Rate and Rhythm: Tachycardia present. Rhythm irregular.     Pulses: Normal pulses.     Heart sounds: Normal heart sounds.  Pulmonary:     Effort: Pulmonary effort is normal. Tachypnea present. No respiratory distress.     Breath sounds: Decreased air movement present. No stridor. Wheezing present.  Abdominal:     General: Abdomen is flat. There is no distension.     Palpations: Abdomen is soft.     Tenderness: There is no abdominal tenderness.  Musculoskeletal:     Cervical back: No rigidity.     Right lower leg: No edema.     Left lower leg: No edema.  Skin:    General: Skin is warm and dry.     Capillary Refill: Capillary refill takes less than 2 seconds.  Neurological:     Mental Status: She is alert.  Psychiatric:        Mood and Affect: Mood normal.        Behavior: Behavior normal. Behavior is cooperative.     ED Results and  Treatments Labs (all labs ordered are listed, but only abnormal results are displayed) Labs Reviewed  BASIC METABOLIC PANEL - Abnormal; Notable for the following components:      Result Value   Glucose, Bld 111 (*)    All other components within normal limits  BRAIN NATRIURETIC PEPTIDE - Abnormal; Notable for the following components:   B Natriuretic Peptide 206.7 (*)    All other components within normal limits  RESP PANEL BY RT-PCR (RSV, FLU Li&B, COVID)  RVPGX2  CBC WITH DIFFERENTIAL/PLATELET  HEPARIN LEVEL (UNFRACTIONATED)  TROPONIN I (HIGH SENSITIVITY)  TROPONIN I (HIGH SENSITIVITY)  Radiology DG Chest 2 View Result Date: 10/17/2023 CLINICAL DATA:  Shortness of breath.  Productive cough. EXAM: CHEST - 2 VIEW COMPARISON:  CT scan chest from 09/28/2023. FINDINGS: There are increased interstitial markings throughout bilateral lungs, which appears more pronounced than the prior exams and concerning for underlying mild pulmonary edema. Patient has mild underlying chronic interstitial lung disease, better evaluated on the recent chest CT scan. There is also blunting of bilateral costophrenic angles, suggesting bilateral trace pleural effusions, which is also new since the prior study. There are probable atelectatic changes at the lung bases. No dense consolidation or lung collapse. No pneumothorax. Mildly enlarged cardio-mediastinal silhouette. No acute osseous abnormalities. The soft tissues are within normal limits. IMPRESSION: *Redemonstration of chronic interstitial lung disease. However, there are increased interstitial markings since the prior exam and new bilateral trace pleural effusions, concerning for superimposed congestive heart failure/pulmonary edema. Correlate clinically. Electronically Signed   By: Jules Schick M.D.   On: 10/17/2023 09:18    Pertinent labs &  imaging results that were available during my care of the patient were reviewed by me and considered in my medical decision making (see MDM for details).  Medications Ordered in ED Medications  albuterol (VENTOLIN HFA) 108 (90 Base) MCG/ACT inhaler 2 puff (has no administration in time range)  albuterol (PROVENTIL) (2.5 MG/3ML) 0.083% nebulizer solution (  Not Given 10/17/23 0828)  metoprolol tartrate (LOPRESSOR) injection 5 mg (5 mg Intravenous Given 10/17/23 1105)  heparin ADULT infusion 100 units/mL (25000 units/236mL) (950 Units/hr Intravenous New Bag/Given 10/17/23 0932)  diltiazem (CARDIZEM) 125 mg in dextrose 5% 125 mL (1 mg/mL) infusion (has no administration in time range)  ipratropium-albuterol (DUONEB) 0.5-2.5 (3) MG/3ML nebulizer solution 3 mL (3 mLs Nebulization Not Given 10/17/23 1017)  sodium chloride 0.9 % bolus 1,000 mL (1,000 mLs Intravenous New Bag/Given 10/17/23 0827)  methylPREDNISolone sodium succinate (SOLU-MEDROL) 125 mg/2 mL injection 62.5 mg (62.5 mg Intravenous Given 10/17/23 0827)  magnesium sulfate IVPB 2 g 50 mL (0 g Intravenous Stopped 10/17/23 1022)  heparin bolus via infusion 4,000 Units (4,000 Units Intravenous Bolus from Bag 10/17/23 0932)  furosemide (LASIX) injection 20 mg (20 mg Intravenous Given 10/17/23 1105)                                                                                                                                     Procedures .Critical Care  Performed by: Sloan Leiter, DO Authorized by: Sloan Leiter, DO   Critical care provider statement:    Critical care time (minutes):  42   Critical care time was exclusive of:  Separately billable procedures and treating other patients   Critical care was necessary to treat or prevent imminent or life-threatening deterioration of the following conditions:  Respiratory failure and cardiac failure   Critical care was time spent personally by me on the following activities:  Development of treatment  plan with patient  or surrogate, discussions with consultants, evaluation of patient's response to treatment, examination of patient, ordering and review of laboratory studies, ordering and review of radiographic studies, ordering and performing treatments and interventions, pulse oximetry, re-evaluation of patient's condition, review of old charts and obtaining history from patient or surrogate   Care discussed with: admitting provider     (including critical care time)  Medical Decision Making / ED Course    Medical Decision Making:    Erin Li is Li 83 y.o. female with past medical history as below, significant for CAD, hypertension, COPD not on home oxygen, former smoker who presents to the ED with complaint of cough, dyspnea, palpitations. The complaint involves an extensive differential diagnosis and also carries with it Li high risk of complications and morbidity.  Serious etiology was considered. Ddx includes but is not limited to: In my evaluation of this patient's dyspnea my DDx includes, but is not limited to, pneumonia, pulmonary embolism, pneumothorax, pulmonary edema, metabolic acidosis, asthma, COPD, cardiac cause, anemia, anxiety, etc.  Differential includes all life-threatening causes for chest pain. This includes but is not exclusive to acute coronary syndrome, aortic dissection, pulmonary embolism, cardiac tamponade, community-acquired pneumonia, pericarditis, musculoskeletal chest wall pain, etc.   Complete initial physical exam performed, notably the patient was in heart rate elevated, RVR, she has tachypnea but no hypoxia.    Reviewed and confirmed nursing documentation for past medical history, family history, social history.  Vital signs reviewed.     Clinical Course as of 10/17/23 1115  Wed Oct 17, 2023  0920 Rate improved with lopressor. Still in afib [SG]  0933 CXR w/ ILD, also possible concurrent CHF. She has new onset AFIB, her BNP is 206, she has trace LE  edema.  [SG]  [id number] Plan admit for COPD exacerbation and new onset AFIB rvr [SG]  0956 Typical pulse ox around 94% per chart review [SG]  1002 LVEF 50-55%, G1 aortic regurg, trace mitral regurg; prev followed w/ Dr Jacinto Halim  [SG]  1003 We are getting good rate control with metoprolol. She has some evidence of CHF on her exam/imaging, want to be judicious with fluids. Given her soft blood pressure will give judicious diuresis.  [SG]  1054 Pt received only around of her NS bolus [SG]    Clinical Course User Index [SG] Sloan Leiter, DO    Brief summary: 83 year old female history as above including COPD, CAD and hypertension here with cough, dyspnea, palpitations, some chest tightness.  On arrival she is in Li-fib with RVR, no history of this.  She takes diltiazem at home. She also has diffuse wheezing on exam with tachypnea, symptoms consistent with COPD exacerbation.  No hypoxia.  Will give nebulized breathing treatment, steroids, mag sulfate, IV fluids.  Her CHA2DS2-VASc is 4, she denies any evidence of bleeding, she is agreeable to anticoagulation.  Patient new-onset Li-fib, probable COPD exacerbation, possible CHF (BNP 200's) likely driven by the Li-fib.  Start heparin, requiring diltiazem for rate control (she is on PO dilt @ home).  Received DuoNebs and steroids, mag sulfate.  Placed on 2 L nasal cannula. Breathing feeling much better.  Plan to admit. Dr Austin Miles accepting              Additional history obtained: -Additional history obtained from family -External records from outside source obtained and reviewed including: Chart review including previous notes, labs, imaging, consultation notes including  Pulmonary documentation, home medications, prior ED visit Is Dr. Marchelle Gearing pulmonary  Lab Tests: -I ordered, reviewed, and interpreted labs.   The pertinent results include:   Labs Reviewed  BASIC METABOLIC PANEL - Abnormal; Notable for the following components:       Result Value   Glucose, Bld 111 (*)    All other components within normal limits  BRAIN NATRIURETIC PEPTIDE - Abnormal; Notable for the following components:   B Natriuretic Peptide 206.7 (*)    All other components within normal limits  RESP PANEL BY RT-PCR (RSV, FLU Li&B, COVID)  RVPGX2  CBC WITH DIFFERENTIAL/PLATELET  HEPARIN LEVEL (UNFRACTIONATED)  TROPONIN I (HIGH SENSITIVITY)  TROPONIN I (HIGH SENSITIVITY)    Notable for as above  EKG   EKG Interpretation Date/Time:  Wednesday October 17 2023 08:34:29 EST Ventricular Rate:  70 PR Interval:    QRS Duration:  86 QT Interval:  385 QTC Calculation: 416 R Axis:   97  Text Interpretation: Atrial fibrillation Ventricular premature complex Anterior infarct, old Confirmed by Tanda Rockers ([id number]) on 10/17/2023 9:10:57 AM         Imaging Studies ordered: I ordered imaging studies including CXR I independently visualized the following imaging with scope of interpretation limited to determining acute life threatening conditions related to emergency care; findings noted above I independently visualized and interpreted imaging. I agree with the radiologist interpretation   Medicines ordered and prescription drug management: Meds ordered this encounter  Medications   albuterol (VENTOLIN HFA) 108 (90 Base) MCG/ACT inhaler 2 puff   ipratropium-albuterol (DUONEB) 0.5-2.5 (3) MG/3ML nebulizer solution 3 mL   DISCONTD: albuterol (PROVENTIL) (2.5 MG/3ML) 0.083% nebulizer solution 2.5 mg   sodium chloride 0.9 % bolus 1,000 mL   methylPREDNISolone sodium succinate (SOLU-MEDROL) 125 mg/2 mL injection 62.5 mg    IV methylprednisolone will be converted to either Li q12h or q24h frequency with the same total Li dose (TDD).  Ordered Dose: 1 to 125 mg TDD; convert to: TDD q24h.  Ordered Dose: 126 to 250 mg TDD; convert to: TDD div q12h.  Ordered Dose: >250 mg TDD; DAW.   DISCONTD: metoprolol tartrate (LOPRESSOR) injection 5 mg   magnesium  sulfate IVPB 2 g 50 mL   ipratropium-albuterol (DUONEB) 0.5-2.5 (3) MG/3ML nebulizer solution    Erin Li, Erin Li: cabinet override   albuterol (PROVENTIL) (2.5 MG/3ML) 0.083% nebulizer solution    Erin Li, Erin Li: cabinet override   metoprolol tartrate (LOPRESSOR) injection 5 mg   heparin bolus via infusion 4,000 Units   heparin ADULT infusion 100 units/mL (25000 units/24mL)   furosemide (LASIX) injection 20 mg   diltiazem (CARDIZEM) 125 mg in dextrose 5% 125 mL (1 mg/mL) infusion    -I have reviewed the patients home medicines and have made adjustments as needed   Consultations Obtained: na   Cardiac Monitoring: The patient was maintained on Li cardiac monitor.  I personally viewed and interpreted the cardiac monitored which showed an underlying rhythm of: afib rvr Continuous pulse oximetry interpreted by myself, 94% on RA.    Social Determinants of Health:  Diagnosis or treatment significantly limited by social determinants of health: former smoker   Reevaluation: After the interventions noted above, I reevaluated the patient and found that they have improved  Co morbidities that complicate the patient evaluation  Past Medical History:  Diagnosis Date   COPD (chronic obstructive pulmonary disease) (HCC)    Coronary artery disease    Diverticulosis    HTN (hypertension)    Lung nodule       Dispostion: Disposition decision including  need for hospitalization was considered, and patient admitted to the hospital.    Final Clinical Impression(s) / ED Diagnoses Final diagnoses:  New onset atrial fibrillation (HCC)  COPD with acute exacerbation (HCC)        Sloan Leiter, DO 10/17/23 1115

## 2023-10-17 NOTE — Plan of Care (Signed)

## 2023-10-17 NOTE — ED Notes (Signed)
Called Thomas at [facility] for transport: 13:26

## 2023-10-17 NOTE — Progress Notes (Addendum)
PHARMACY - ANTICOAGULATION CONSULT NOTE  Pharmacy Consult for IV heparin Indication: atrial fibrillation  No Known Allergies  Patient Measurements: Height: 5\' 5"  (165.1 cm) Weight: 71.1 kg (156 lb 12 oz) IBW/kg (Calculated) : 57 Heparin Dosing Weight: 66 kg  Vital Signs: Temp: 97.7 F (36.5 C) (02/12 1535) Temp Source: Oral (02/12 1535) BP: 128/76 (02/12 1535) Pulse Rate: 80 (02/12 1535)  Labs: Recent Labs    10/17/23 0801 10/17/23 0805 10/17/23 1114  HGB  --  13.6  --   HCT  --  39.7  --   PLT  --  264  --   CREATININE 0.81  --   --   TROPONINIHS  --  13 12    Estimated Creatinine Clearance: 53.8 mL/min (by C-G formula based on SCr of 0.81 mg/dL).   Medical History: Past Medical History:  Diagnosis Date   COPD (chronic obstructive pulmonary disease) (HCC)    Coronary artery disease    Diverticulosis    HTN (hypertension)    Lung nodule    Assessment: Erin Li is a 83 y.o. year old female admitted on 10/17/2023 with concern for new onset afib. No AC PTA. Pharmacy consulted to dose heparin.  Hgb 13.6, plt 264. Was started on heparin infusion earlier. Given age>80 but wt >60 kg and Scr<1.5, qualifies for apixaban 5 mg BID. No s/sx of bleeding.   Goal of Therapy:  Heparin level 0.3-0.7 units/ml Monitor platelets by anticoagulation protocol: Yes   Plan:  Stop heparin infusion Start apixaban 5 mg BID  Monitor CBC and for s/sx of bleeding   Thank you for allowing pharmacy to participate in this patient's care,  Sherron Monday, PharmD, BCCCP Clinical Pharmacist  Phone: [phone] 10/17/2023 4:53 PM  Please check AMION for all [facility] Pharmacy phone numbers After 10:00 PM, call Main Pharmacy [phone]

## 2023-10-17 NOTE — ED Notes (Signed)
RT Note:  Patient was first placed on 2lpm [place] but oxygen saturation was consistently staying at 92-93%. Her FIO2 was increased to 32% to maintain an oxygen saturation at 96%. Patient is tolerating well at this time

## 2023-10-17 NOTE — ED Notes (Signed)
RT Note: Patient appears to be in an onset A-fib. Physician aware and breathing treatment still ordered at this time. Patient shows signs of SOB and WOB without breathing treatment.

## 2023-10-17 NOTE — Progress Notes (Signed)
PHARMACY - ANTICOAGULATION CONSULT NOTE  Pharmacy Consult for IV heparin Indication: atrial fibrillation  No Known Allergies  Patient Measurements: Height: 5\' 5"  (165.1 cm) Weight: 66 kg (145 lb 8.1 oz) IBW/kg (Calculated) : 57 Heparin Dosing Weight: 66 kg  Vital Signs: Temp: 97.8 F (36.6 C) (02/12 0800) Temp Source: Oral (02/12 0800) BP: 114/78 (02/12 0835) Pulse Rate: 59 (02/12 0840)  Labs: Recent Labs    10/17/23 0805  HGB 13.6  HCT 39.7  PLT 264    CrCl cannot be calculated (Patient's most recent lab result is older than the maximum 21 days allowed.).   Medical History: Past Medical History:  Diagnosis Date   COPD (chronic obstructive pulmonary disease) (HCC)    Coronary artery disease    Diverticulosis    HTN (hypertension)    Lung nodule    Assessment: Erin Li is a 83 y.o. year old female admitted on 10/17/2023 with concern for new onset afib. No anticoagulation prior to admission. Pharmacy consulted to dose heparin.  Goal of Therapy:  Heparin level 0.3-0.7 units/ml Monitor platelets by anticoagulation protocol: Yes   Plan:  Heparin 4000 units x 1 as bolus followed by heparin infusion at 950 units/hr 8 heparin level  Daily heparin level, CBC, and monitoring for bleeding F/u plans for anticoagulation   Thank you for allowing pharmacy to participate in this patient's care.  Marja Kays, PharmD Emergency Medicine Clinical Pharmacist 10/17/2023,8:43 AM

## 2023-10-17 NOTE — Plan of Care (Signed)
Problem: Education: Goal: Knowledge of General Education information will improve Description: Including pain rating scale, medication(s)/side effects and non-pharmacologic comfort measures Outcome: Progressing   Problem: Clinical Measurements: Goal: Respiratory complications will improve Outcome: Progressing   Problem: Activity: Goal: Risk for activity intolerance will decrease Outcome: Progressing   Problem: Coping: Goal: Level of anxiety will decrease Outcome: Progressing   Problem: Safety: Goal: Ability to remain free from injury will improve Outcome: Progressing

## 2023-10-18 ENCOUNTER — Inpatient Hospital Stay (HOSPITAL_COMMUNITY): Payer: Medicare Other

## 2023-10-18 DIAGNOSIS — I4891 Unspecified atrial fibrillation: Secondary | ICD-10-CM

## 2023-10-18 LAB — BASIC METABOLIC PANEL
Anion gap: 11 (ref 5–15)
BUN: 18 mg/dL (ref 8–23)
CO2: 21 mmol/L — ABNORMAL LOW (ref 22–32)
Calcium: 8.7 mg/dL — ABNORMAL LOW (ref 8.9–10.3)
Chloride: 107 mmol/L (ref 98–111)
Creatinine, Ser: 0.92 mg/dL (ref 0.44–1.00)
GFR, Estimated: >60 mL/min (ref >60)
Glucose, Bld: 154 mg/dL — ABNORMAL HIGH (ref 70–99)
Potassium: 3.8 mmol/L (ref 3.5–5.1)
Sodium: 139 mmol/L (ref 135–145)

## 2023-10-18 LAB — TSH: TSH: 0.738 u[IU]/mL (ref 0.350–4.500)

## 2023-10-18 LAB — ECHOCARDIOGRAM COMPLETE
Height: 65 in
S' Lateral: 3.7 cm
Weight: 2355.2 [oz_av]

## 2023-10-18 LAB — C-REACTIVE PROTEIN: CRP: 0.8 mg/dL (ref <1.0)

## 2023-10-18 MED ORDER — ATORVASTATIN CALCIUM 10 MG PO TABS
10.0000 mg | ORAL_TABLET | Freq: Every day | ORAL | Status: DC
  Filled 2023-10-18 (×6): qty 1

## 2023-10-18 MED ORDER — MELATONIN 3 MG PO TABS
3.0000 mg | ORAL_TABLET | Freq: Once | ORAL | Status: AC
  Administered 2023-10-18: 3 mg via ORAL
  Filled 2023-10-18: qty 1

## 2023-10-18 MED ORDER — BISOPROLOL FUMARATE 5 MG PO TABS
5.0000 mg | ORAL_TABLET | Freq: Every day | ORAL | Status: DC
  Administered 2023-10-18 – 2023-10-21 (×4): 5 mg via ORAL
  Filled 2023-10-18 (×4): qty 1

## 2023-10-18 MED ORDER — LEVALBUTEROL HCL 0.63 MG/3ML IN NEBU
0.6300 mg | INHALATION_SOLUTION | Freq: Two times a day (BID) | RESPIRATORY_TRACT | Status: DC
  Administered 2023-10-18 – 2023-10-21 (×6): 0.63 mg via RESPIRATORY_TRACT
  Filled 2023-10-18 (×6): qty 3

## 2023-10-18 MED ORDER — IPRATROPIUM BROMIDE 0.02 % IN SOLN
0.5000 mg | Freq: Two times a day (BID) | RESPIRATORY_TRACT | Status: DC
  Administered 2023-10-18 – 2023-10-21 (×6): 0.5 mg via RESPIRATORY_TRACT
  Filled 2023-10-18 (×6): qty 2.5

## 2023-10-18 MED ORDER — IPRATROPIUM BROMIDE 0.02 % IN SOLN
0.5000 mg | Freq: Three times a day (TID) | RESPIRATORY_TRACT | Status: DC
  Administered 2023-10-18: 0.5 mg via RESPIRATORY_TRACT

## 2023-10-18 MED ORDER — LEVALBUTEROL HCL 0.63 MG/3ML IN NEBU
0.6300 mg | INHALATION_SOLUTION | Freq: Three times a day (TID) | RESPIRATORY_TRACT | Status: DC
  Administered 2023-10-18: 0.63 mg via RESPIRATORY_TRACT

## 2023-10-18 MED ORDER — ZOLPIDEM TARTRATE 5 MG PO TABS
2.5000 mg | ORAL_TABLET | Freq: Once | ORAL | Status: AC
  Administered 2023-10-19: 2.5 mg via ORAL
  Filled 2023-10-18: qty 1

## 2023-10-18 NOTE — TOC Initial Note (Signed)
Transition of Care [facility]) - Initial/Assessment Note    Patient Details  Name: Erin Li MRN: [id number] Date of Birth: 1941-02-17  Transition of Care [facility]) CM/SW Contact:    Leone Haven, RN Phone Number: 10/18/2023, 4:03 PM  Clinical Narrative:                 From home with spouse, has PCP  Dr. [name] and insurance on file, states has no [facility] services in place at this time, has walker, bsc, and a cane at home.  States spouse will transport them home at [employer] and he  is support system, states gets medications from [facility] in [place] and [facility] [place].  Pta self ambulatory.  She will need home oxygen, NCM offered choice, she has no preference.  NCM made referral to Zollie Beckers with [facility],  he will deliver the oxygen tank for her to go home with in the am.    Expected Discharge Plan: Home/Self Care Barriers to Discharge: Continued Medical Work up   Patient Goals and CMS Choice Patient states their goals for this hospitalization and ongoing recovery are:: return home   Choice offered to / list presented to : Patient      Expected Discharge Plan and Services   Discharge Planning Services: CM Consult Post Acute Care Choice: Durable Medical Equipment Living arrangements for the past 2 months: Single Family Home                 DME Arranged: Oxygen DME Agency: [facility] Date DME Agency Contacted: 10/18/23 Time DME Agency Contacted: [phone] Representative spoke with at DME Agency: Zollie Beckers [facility] Arranged: NA          Prior Living Arrangements/Services Living arrangements for the past 2 months: Single Family Home Lives with:: Spouse Patient language and need for interpreter reviewed:: Yes Do you feel safe going back to the place where you live?: Yes      Need for Family Participation in Patient Care: Yes (Comment) Care giver support system in place?: Yes (comment) Current home services: DME (walker, bsc, cane) Criminal Activity/Legal Involvement  Pertinent to Current Situation/Hospitalization: No - Comment as needed  Activities of Daily Living   ADL Screening (condition at time of admission) Independently performs ADLs?: Yes (appropriate for developmental age) Is the patient deaf or have difficulty hearing?: No Does the patient have difficulty seeing, even when wearing glasses/contacts?: No Does the patient have difficulty concentrating, remembering, or making decisions?: No  Permission Sought/Granted Permission sought to share information with : Case Manager Permission granted to share information with : Yes, Verbal Permission Granted              Emotional Assessment Appearance:: Appears stated age Attitude/Demeanor/Rapport: Engaged Affect (typically observed): Appropriate Orientation: : Oriented to Self, Oriented to Place, Oriented to  Time, Oriented to Situation Alcohol / Substance Use: Not Applicable Psych Involvement: No (comment)  Admission diagnosis:  Acute pulmonary edema (HCC) [J81.0] New onset atrial fibrillation (HCC) [I48.91] COPD with acute exacerbation (HCC) [J44.1] Patient Active Problem List   Diagnosis Date Noted   New onset atrial fibrillation (HCC) 10/17/2023   HLD (hyperlipidemia) 02/26/2022   Essential hypertension 02/26/2022   Femur fracture, right (HCC) 02/25/2022   Pain in right foot 12/10/2019   Elevated C-reactive protein (CRP) 01/18/2017   COPD (chronic obstructive pulmonary disease) (HCC) 09/25/2012   Lung nodule 07/14/2012   Hx of smoking 07/14/2012   PCP:  [name], DO Pharmacy:   [facility] [place] - [address] [place] [address] Phone: [phone] Fax: [phone]  [facility] [place], [place] - [address] [place] [address] Phone: [phone] Fax: [phone]  [facility] [place], [place] - [address] [place] [address] [place] [address] Phone:  [phone] Fax: [phone]  [facility] [address] [place] [address] Phone: [phone] Fax: [phone]     Social Drivers of Health (SDOH) Social History: SDOH Screenings   Food Insecurity: No Food Insecurity (10/17/2023)  Housing: Unknown (10/17/2023)  Transportation Needs: No Transportation Needs (10/17/2023)  Utilities: Not At Risk (10/17/2023)  Social Connections: Socially Integrated (10/17/2023)  Tobacco Use: Medium Risk (10/17/2023)   SDOH Interventions:     Readmission Risk Interventions     No data to display

## 2023-10-18 NOTE — Progress Notes (Signed)
  Echocardiogram 2D Echocardiogram has been performed.  Delcie Roch 10/18/2023, 2:26 PM

## 2023-10-18 NOTE — Consult Note (Addendum)
Cardiology Consultation   Patient ID: [name] MRN: [id number]; DOB: 11/30/1940  Admit date: 10/17/2023 Date of Consult: 10/18/2023  PCP:  [name], DO   [facility] Providers Cardiologist:  Yates Decamp, MD     Patient Profile:   Erin Li is a 83 y.o. 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 Lynne Logan.   History of Present Illness:   Erin Li 83 year old female with above medical history.  Patient reports being diagnosed with CAD s/p angioplasty in her 56's while in [place]. After she relocated to [facility], she followed up with Dr. Charleston Poot briefly.     Per pulmonary note, patient last PFT on 12/04/2022 noted slight improvement compared to previous results. 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  During this visit, she was started on continued on Breo and started on Spiriva along with prilosec.  Patient presented to the ED on 10/17/2023 for complaints of cough, dyspnea and palpitations. Says that she thought symptoms were related to her ILD. In the she was noted to have diffuse wheezing wheezing with tachypnea and irregular tachycardia.  ED started the patient on IV heparin and IV diltiazem.  Patient also was treated with nebulized breathing treatment, steroids, mag sulfate and IV fluids. Labs: K/MG/TSH WNL, Ca 8.7, BNP 206.7, hsTn negative x2 Initial vitals: 132/103, HR 161, O2 95% Initial EKG: Afib w/ RVR, HR 134 Echo: LVEF 40-45%, global hypokinesis. G2DD. RV mildly enlarged. Moderately elevated PASP. Moderate L/R atrial dilation. Moderate MR.  CXR: Redemonstration of chronic interstitial lung disease. However, there are increased interstitial markings since the prior exam and new bilateral  trace pleural effusions, concerning for superimposed congestive heart failure/pulmonary edema.   Patient reports dyspnea on exertion and orthopnea prompted ED visit 2/12. She denied any chest pain. She reported not noticing any palpitations but could have been overlooked due to respiratory concerns. She did not notice any LE edema prior to arrival to ED. Cardiology asked to evaluate in the setting of atrial fibrillation and CHF. In review of telemetry, patient converted to sinus rhythm around 2/12 around 5pm. Continues to have PACs.   Past Medical History:  Diagnosis Date   COPD (chronic obstructive pulmonary disease) (HCC)    Coronary artery disease    Diverticulosis    HTN (hypertension)    Lung nodule     Past Surgical History:  Procedure Laterality Date   CARDIAC CATHETERIZATION     CORONARY ANGIOPLASTY     FRACTURE SURGERY     INTRAMEDULLARY (IM) NAIL INTERTROCHANTERIC Right 02/26/2022   Procedure: INTRAMEDULLARY (IM) NAIL INTERTROCHANTRIC;  Surgeon: Bjorn Pippin, MD;  Location: [facility] OR;  Service: Orthopedics;  Laterality: Right;   TONSILLECTOMY AND ADENOIDECTOMY        Inpatient Medications: Scheduled Meds:  apixaban  5 mg Oral BID   arformoterol  15 mcg Nebulization BID   atorvastatin  10 mg Oral Daily   bisoprolol  5 mg Oral Daily   budesonide (PULMICORT) nebulizer solution  0.25 mg Nebulization BID   furosemide  40 mg Intravenous Daily   guaiFENesin  600 mg Oral BID   levalbuterol  0.63 mg Nebulization BID   And   ipratropium  0.5 mg Nebulization BID  pantoprazole  40 mg Oral Q1200   predniSONE  40 mg Oral Q breakfast   revefenacin  175 mcg Nebulization Daily   sodium chloride flush  3 mL Intravenous Q12H   Continuous Infusions:   PRN Meds: acetaminophen **OR** acetaminophen, albuterol, metoprolol tartrate, ondansetron **OR** ondansetron (ZOFRAN) IV, polyethylene glycol, sodium chloride flush  Allergies:   No Known Allergies  Social History:   Social History    Socioeconomic History   Marital status: Married    Spouse name: Not on file   Number of children: Not on file   Years of education: Not on file   Highest education level: Not on file  Occupational History   Not on file  Tobacco Use   Smoking status: Former    Current packs/day: 0.00    Average packs/day: 0.8 packs/day for 50.0 years (37.5 ttl pk-yrs)    Types: Cigarettes    Start date: 11/10/1961    Quit date: 11/11/2011    Years since quitting: 11.9   Smokeless tobacco: Never   Tobacco comments:    Encouraged to remain smoke free  Substance and Sexual Activity   Alcohol use: Yes   Drug use: Not on file   Sexual activity: Not on file  Other Topics Concern   Not on file  Social History Narrative   Not on file   Social Drivers of Health   Financial Resource Strain: Not on file  Food Insecurity: No Food Insecurity (10/17/2023)   Hunger Vital Sign    Worried About Running Out of Food in the Last Year: Never true    Ran Out of Food in the Last Year: Never true  Transportation Needs: No Transportation Needs (10/17/2023)   PRAPARE - Administrator, Civil Service (Medical): No    Lack of Transportation (Non-Medical): No  Physical Activity: Not on file  Stress: Not on file  Social Connections: Socially Integrated (10/17/2023)   Social Connection and Isolation Panel [NHANES]    Frequency of Communication with Friends and Family: More than three times a week    Frequency of Social Gatherings with Friends and Family: More than three times a week    Attends Religious Services: More than 4 times per year    Active Member of [employer] or Organizations: Yes    Attends Banker Meetings: Patient declined    Marital Status: Married  Catering manager Violence: Not At Risk (10/17/2023)   Humiliation, Afraid, Rape, and Kick questionnaire    Fear of Current or Ex-Partner: No    Emotionally Abused: No    Physically Abused: No    Sexually Abused: No    Family History:     Family History  Problem Relation Age of Onset   Heart attack Father    Kidney disease Father    COPD Mother    Lung cancer Brother 83   Pulmonary fibrosis Son        had lung transplant     ROS:  Please see the history of present illness.   All other ROS reviewed and negative.     Physical Exam/Data:   Vitals:   10/18/23 0739 10/18/23 0919 10/18/23 0923 10/18/23 1612  BP:   (!) 105/57 129/64  Pulse:  79 91 79  Resp:  16  16  Temp:    97.8 F (36.6 C)  TempSrc:    Oral  SpO2: 99% 94% 92% 93%  Weight:      Height:  Intake/Output Summary (Last 24 hours) at 10/18/2023 1846 Last data filed at 10/18/2023 1453 Gross per 24 hour  Intake 819.41 ml  Output 2200 ml  Net -1380.59 ml      10/18/2023    5:16 AM 10/17/2023    8:46 AM 10/17/2023    8:40 AM  Last 3 Weights  Weight (lbs) 147 lb 3.2 oz 156 lb 12 oz 145 lb 8.1 oz  Weight (kg) 66.769 kg 71.1 kg 66 kg     Body mass index is 24.5 kg/m.  General: Sitting upright in bed with 2L O2 in no acute distress HEENT: normal Neck: no JVD Vascular: No carotid bruits; Distal pulses 2+ bilaterally Cardiac:  normal S1, S2; RRR; Lungs:  clear to auscultation bilaterally, no wheezing, rhonchi or rales  Abd: soft, nontender, no hepatomegaly  Ext: trace edema LE  Musculoskeletal:  No deformities, BUE and BLE strength normal and equal Skin: warm and dry  Neuro:  CNs 2-12 intact, no focal abnormalities noted Psych:  Normal affect   EKG:  The EKG was personally reviewed and demonstrates:  NSR with occasional PVC, HR 97 Telemetry:  Telemetry was personally reviewed and demonstrates: Atrial fibrillation with RVR with conversion to NSR 5pm (2/12) with occasional PAC/PVC, HR 80-100's  Relevant CV Studies:  ECHO 10/18/2023 IMPRESSIONS   1. Left ventricular ejection fraction, by estimation, is 40 to 45%. The  left ventricle has mildly decreased function. The left ventricle  demonstrates global hypokinesis. Left ventricular  diastolic parameters are consistent with Grade II diastolic  dysfunction (pseudonormalization).   2. Right ventricular systolic function is mildly reduced. The right  ventricular size is mildly enlarged. There is moderately elevated  pulmonary artery systolic pressure. The estimated right ventricular  systolic pressure is 59.3 mmHg.   3. Left atrial size was moderately dilated.   4. Right atrial size was moderately dilated.   5. The mitral valve is normal in structure. 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 No evidence of mitral  stenosis. Moderate mitral annular calcification.   6. Tricuspid valve regurgitation is moderate.   7. The aortic valve is tricuspid. There is mild calcification of the  aortic valve. Aortic valve regurgitation is not visualized. No aortic  stenosis is present.   8. The inferior vena cava is normal in size with <50% respiratory  variability, suggesting right atrial pressure of 8 mmHg.   Laboratory Data:  High Sensitivity Troponin:   Recent Labs  Lab 10/17/23 0805 10/17/23 1114  TROPONINIHS 13 12     Chemistry Recent Labs  Lab 10/17/23 0801 10/18/23 0247  NA 141 139  K 4.0 3.8  CL 108 107  CO2 23 21*  GLUCOSE 111* 154*  BUN 19 18  CREATININE 0.81 0.92  CALCIUM 9.3 8.7*  GFRNONAA >60 >60  ANIONGAP 10 11    No results for input(s): "PROT", "ALBUMIN", "AST", "ALT", "ALKPHOS", "BILITOT" in the last 168 hours. Lipids No results for input(s): "CHOL", "TRIG", "HDL", "LABVLDL", "LDLCALC", "CHOLHDL" in the last 168 hours.  Hematology Recent Labs  Lab 10/17/23 0805  WBC 5.9  RBC 4.02  HGB 13.6  HCT 39.7  MCV 98.8  MCH 33.8  MCHC 34.3  RDW 14.2  PLT 264   Thyroid  Recent Labs  Lab 10/18/23 0247  TSH 0.738    BNP Recent Labs  Lab 10/17/23 0810  BNP 206.7*    DDimer No results for input(s): "DDIMER" in the last 168 hours.   Radiology/Studies:  ECHOCARDIOGRAM COMPLETE  Result Date: 10/18/2023     ECHOCARDIOGRAM REPORT   Patient Name:   MAAT KAFER Date of Exam: 10/18/2023 Medical Rec #:  [id number]          Height:       65.0 in Accession #:    [id number]         Weight:       147.2 lb Date of Birth:  06-14-1941         BSA:          1.737 m Patient Age:    82 years           BP:           105/57 mmHg Patient Gender: F                  HR:           85 bpm. Exam Location:  Inpatient Procedure: 2D Echo, Cardiac Doppler and Color Doppler (Both Spectral and Color            Flow Doppler were utilized during procedure). Indications:    atrial fibrillation  History:        Patient has prior history of Echocardiogram examinations, most                 recent 06/07/2017. COPD, Arrythmias:Atrial Fibrillation; Risk                 Factors:Former Smoker, Hypertension and Dyslipidemia.  Sonographer:    Delcie Roch RDCS Referring Phys: Sharla Kidney Dewayne Shorter M GHIMIRE IMPRESSIONS  1. Left ventricular ejection fraction, by estimation, is 40 to 45%. The left ventricle has mildly decreased function. The left ventricle demonstrates global hypokinesis. Left ventricular diastolic parameters are consistent with Grade II diastolic dysfunction (pseudonormalization).  2. Right ventricular systolic function is mildly reduced. The right ventricular size is mildly enlarged. There is moderately elevated pulmonary artery systolic pressure. The estimated right ventricular systolic pressure is 59.3 mmHg.  3. Left atrial size was moderately dilated.  4. Right atrial size was moderately dilated.  5. The mitral valve is normal in structure. 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 No evidence of mitral stenosis. Moderate mitral annular calcification.  6. Tricuspid valve regurgitation is moderate.  7. The aortic valve is tricuspid. There is mild calcification of the aortic valve. Aortic valve regurgitation is not visualized. No aortic stenosis is present.  8. The inferior vena cava is normal in size with <50%  respiratory variability, suggesting right atrial pressure of 8 mmHg. FINDINGS  Left Ventricle: Left ventricular ejection fraction, by estimation, is 40 to 45%. The left ventricle has mildly decreased function. The left ventricle demonstrates global hypokinesis. Strain imaging was not performed. The left ventricular internal cavity  size was normal in size. There is no left ventricular hypertrophy. Left ventricular diastolic parameters are consistent with Grade II diastolic dysfunction (pseudonormalization). Right Ventricle: The right ventricular size is mildly enlarged. No increase in right ventricular wall thickness. Right ventricular systolic function is mildly reduced. There is moderately elevated pulmonary artery systolic pressure. The tricuspid regurgitant velocity is 3.58 m/s, and with an assumed right atrial pressure of 8 mmHg, the estimated right ventricular systolic pressure is 59.3 mmHg. Left Atrium: Left atrial size was moderately dilated. Right Atrium: Right atrial size was moderately dilated. Pericardium: Trivial pericardial effusion is present. Mitral Valve: The mitral valve is normal in structure. Moderate mitral annular calcification. Moderate mitral valve regurgitation. No evidence of mitral valve stenosis.  Tricuspid Valve: The tricuspid valve is normal in structure. Tricuspid valve regurgitation is moderate. Aortic Valve: The aortic valve is tricuspid. There is mild calcification of the aortic valve. Aortic valve regurgitation is not visualized. No aortic stenosis is present. Pulmonic Valve: The pulmonic valve was normal in structure. Pulmonic valve regurgitation is trivial. Aorta: The aortic root is normal in size and structure. Venous: The inferior vena cava is normal in size with less than 50% respiratory variability, suggesting right atrial pressure of 8 mmHg. IAS/Shunts: No atrial level shunt detected by color flow Doppler. Additional Comments: 3D imaging was not performed.  LEFT VENTRICLE  PLAX 2D LVIDd:         4.70 cm LVIDs:         3.70 cm LV PW:         1.10 cm LV IVS:        1.00 cm LVOT diam:     1.80 cm LV SV:         41 LV SV Index:   24 LVOT Area:     2.54 cm  RIGHT VENTRICLE          IVC RV Basal diam:  2.50 cm  IVC diam: 1.90 cm LEFT ATRIUM             Index        RIGHT ATRIUM           Index LA diam:        3.90 cm 2.25 cm/m   RA Area:     16.40 cm LA Vol (A2C):   55.9 ml 32.19 ml/m  RA Volume:   43.10 ml  24.82 ml/m LA Vol (A4C):   52.4 ml 30.18 ml/m LA Biplane Vol: 57.9 ml 33.34 ml/m  AORTIC VALVE LVOT Vmax:   82.90 cm/s LVOT Vmean:  50.900 cm/s LVOT VTI:    0.162 m  AORTA Ao Root diam: 2.70 cm Ao Asc diam:  3.20 cm TRICUSPID VALVE TR Peak grad:   51.3 mmHg TR Vmax:        358.00 cm/s  SHUNTS Systemic VTI:  0.16 m Systemic Diam: 1.80 cm Dalton McleanMD Electronically signed by Wilfred Lacy Signature Date/Time: 10/18/2023/2:34:54 PM    Final    DG Chest 2 View Result Date: 10/17/2023 CLINICAL DATA:  Shortness of breath.  Productive cough. EXAM: CHEST - 2 VIEW COMPARISON:  CT scan chest from 09/28/2023. FINDINGS: There are increased interstitial markings throughout bilateral lungs, which appears more pronounced than the prior exams and concerning for underlying mild pulmonary edema. Patient has mild underlying chronic interstitial lung disease, better evaluated on the recent chest CT scan. There is also blunting of bilateral costophrenic angles, suggesting bilateral trace pleural effusions, which is also new since the prior study. There are probable atelectatic changes at the lung bases. No dense consolidation or lung collapse. No pneumothorax. Mildly enlarged cardio-mediastinal silhouette. No acute osseous abnormalities. The soft tissues are within normal limits. IMPRESSION: *Redemonstration of chronic interstitial lung disease. However, there are increased interstitial markings since the prior exam and new bilateral trace pleural effusions, concerning for superimposed  congestive heart failure/pulmonary edema. Correlate clinically. Electronically Signed   By: Jules Schick M.D.   On: 10/17/2023 09:18     Assessment and Plan:  Erin Li is a 83 y.o. female with a hx of CAD, hypertension, COPD not on home oxygen, and former smoker who is being seen 10/18/2023 for the evaluation of new onset Afib & CHF at  the request of Dr. Tanda Rockers. She presented to ED on 10/17/2023 with complaints of cough, dyspnea, and palpations.   New onset Atrial Fibrillation w/ RVR -  Patient does not admit to palpations but reports they could have been overlooked due to dyspnea.  - Initial EKG 10/17/23: Afib w/ RVR, HR 134 (0801) with subsequent EKG showing conversion to SR with PACs - Labs: K/MG/TSH WNL, Ca 8.7, BNP 206.7, hsTn negative x2 - Initial vitals: 132/103, HR 161, O2 95% - ED treated with IV heparin and IV dilt initially with PO Dilt 30 mg q8hrs added today per primary - Now transitioned to Eliquis 5 mg with a CHAD-VASC score of 5. 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 Starting Bisoprolol 5 mg daily given soft BP - may need outpatient EP evaluation in the future if recurrent episodes  Acute HFmrEF  - presented with exertional dyspnea and LE edema. - CXR:  with bilateral pleural effusions/pulmonary edema, chronic ILD - ED started IV Lasix 40 mg - Ins/Outs: -420. Will continue I/O's.  - ECHO 10/18/23: LVEF 40-45%, global hypokinesis. G2DD. RV mildly enlarged. Moderately elevated PASP. Moderate L/R atrial dilation. Moderate MR.  - D/C Dilt. Avoiding CCB in the setting of HFmrEF.  - Start Bisoprolol 5 mg for cardioselective BB - Will consider starting Entresto 24-26 mg tomorrow if BP allows  CAD s/p angioplasty  - Reports that she had angioplasty in her 40's while living in [place]   - She used Diltiazem for prevention of angina. - no ASA with the need for  OAC  HLD  - Home meds continued: Lipitor 10 mg  - check lipids in am  Moderate MR - echo this admission with moderate MR, suspect could be 2/2 to her atrial fibrillation with reduced EF  Otherwise managed by PCP - ILD  - Pulmonary Emphysema Leveda Anna COPD exacerbation this admission  - GERD   Risk Assessment/Risk Scores:   New York Heart Association (NYHA) Functional Class NYHA Class III  CHA2DS2-VASc Score = 5  This indicates a 7.2% annual risk of stroke. The patient's score is based upon: CHF History: 0 HTN History: 1 Diabetes History: 0 Stroke History: 0 Vascular Disease History: 1 Age Score: 2 Gender Score: 1   For questions or updates, please contact [facility] Please consult [email or web address] for contact info under  Osvaldo Shipper, DO  10/18/2023 6:46 PM  Patient seen and examined, note reviewed with the signed Advanced Practice Provider. I personally reviewed laboratory data, imaging studies and relevant notes. I independently examined the patient and formulated the important aspects of the plan. I have personally discussed the plan with the patient and/or family. Comments or changes to the note/plan are indicated below.  Patient seen and examined at her bedside. Sitting up in bed when I arrived.  New onset atrial fibrillation Acute heart failure with midrange ejection fraction CAD status post angioplasty Hyperlipidemia Moderate mitral regurgitation   She is back in sinus rhythm currently is on diltiazem but with depressed ejection fraction this would not be a good idea we should transition her I think she will do well on Bystolic 5 mg daily.  Hold off on antiarrhythmics at this time.  She will need to continue on the Eliquis 5 mg daily. She will benefit from placing a monitor to understand any types of A-fib burden and evaluation by our EP colleagues for rhythm control.  For  her new depressed ejection fraction guideline directed medical therapy is  in order.  We will start the bisoprolol 5 mg daily, hopefully her blood pressure can tolerate to start Entresto or losartan tomorrow.    She has no anginal symptoms at this time this is good.  We are going to stop the aspirin.  Hyperlipidemia continue the patient on Lipitor.  Moderate mitral regurgitation is new.  She is on Lasix 40 mg at this time.  Hopefully we could be able to transition her to p.o. in the morning.     Thomasene Ripple DO, MS [facility] Attending Cardiologist [facility]  [address] [place], [place] [address] [phone] Website: [email or web address]

## 2023-10-18 NOTE — Progress Notes (Signed)
SATURATION QUALIFICATIONS: (This note is used to comply with regulatory documentation for home oxygen)  Patient Saturations on Room Air at Rest = 93%  Patient Saturations on Room Air while Ambulating = 85%  Patient Saturations on 2 Liters of oxygen while Ambulating = 88%  Please briefly explain why patient needs home oxygen: Pt desaturated during ambulation and experienced SOB/DOE. She required 2L to recover SpO2 >88% and was cued on PLB technique.   Cheri Guppy, PT, DPT Acute Rehabilitation Services Office: [phone] Secure Chat Preferred

## 2023-10-18 NOTE — Evaluation (Signed)
Physical Therapy Evaluation Patient Details Name: Erin Li MRN: [id number] DOB: June 03, 1941 Today's Date: 10/18/2023  History of Present Illness  Erin Li is a 83 y.o. year old female admitted on 10/17/2023 with c/o SOB and cough. She was found to have A-fib with RVR and possible new onset CHF/COPD exacerbation. Volume overloaded with 1-2+ pitting edema and new onset of exertional dyspnea. PMH significant for COPD, HTN, HLD, and CAD.   Clinical Impression  Pt admitted with above diagnosis. PTA, she was independent with all functional mobility without an AD, volunteered part-time at [employer], drove, and was independent with all ADLs/IADLs. Pt resides in a 1 story house which has 3 STE with BHR. Pt currently with functional limitations due to the deficits listed below (see PT Problem List). She is below her baseline functioning experiencing DOE and requiring 2L of supplemental oxygen during activity to prevent desaturation to 85% when ambulating or performing standing marches. Instructed pt on use of incentive spirometer and encouraged her to complete 10 reps each hour. Pt will benefit from acute skilled PT to increase her independence and safety with mobility to allow d/c Home. Pt would benefit from OPPT to improve activity tolerance, provide energy conservation techniques, and decrease need for supplemental oxygen.       If plan is discharge home, recommend the following: A little help with walking and/or transfers;A little help with bathing/dressing/bathroom;Help with stairs or ramp for entrance   Can travel by private vehicle        Equipment Recommendations None recommended by PT (Pt already has DME, may benefit from a rollator pending progress)  Recommendations for Other Services       Functional Status Assessment Patient has had a recent decline in their functional status and demonstrates the ability to make significant improvements in function in a  reasonable and predictable amount of time.     Precautions / Restrictions Precautions Precautions: Fall Restrictions Weight Bearing Restrictions Per Provider Order: No      Mobility  Bed Mobility               General bed mobility comments: Not assessed. Pt greeted seated EOB.    Transfers Overall transfer level: Needs assistance Equipment used: None Transfers: Sit to/from Stand, Bed to chair/wheelchair/BSC Sit to Stand: Supervision   Step pivot transfers: Supervision       General transfer comment: Entered room mid way throught pt performed BSC>bed, then STS from EOB to wash her hands at the sink all without an AD, independently. without staff present. Supervision for STS from EOB at lowest height.    Ambulation/Gait Ambulation/Gait assistance: Contact guard assist Gait Distance (Feet): 100 Feet (1x80, standing rest with unilat UE support on handrail; 1x20, 1x100, seated rest) Assistive device: None Gait Pattern/deviations: Step-through pattern, Decreased stride length, Drifts right/left   Gait velocity interpretation: 1.31 - 2.62 ft/sec, indicative of limited community ambulator   General Gait Details: Pt ambulated with a symmetrical gait pattern, feet shoulder width apart, upright posture, and reported baseline pace. Pt doesn't walk in a straight path and is slightly unsteady when d/t inconsistent hip pain. Pt experiences DOE which limits her gait distance, requires recovery periods to rest. Cued PLB.  Stairs Stairs: Yes Stairs assistance: Contact guard assist Stair Management: Two rails Number of Stairs: 10 General stair comments: Simulated stair climbing by having pt perform standing marches inside RW with BUE support x10 ea leg.  Wheelchair Mobility     Tilt Bed  Modified Rankin (Stroke Patients Only)       Balance Overall balance assessment: Mild deficits observed, not formally tested                                            Pertinent Vitals/Pain Pain Assessment Pain Assessment: No/denies pain    Home Living Family/patient expects to be discharged to:: Private residence Living Arrangements: Spouse/significant other Available Help at Discharge: Family;Available 24 hours/day Type of Home: House Home Access: Stairs to enter Entrance Stairs-Rails: Can reach both Entrance Stairs-Number of Steps: 3   Home Layout: One level Home Equipment: Agricultural consultant (2 wheels);Cane - single point;BSC/3in1      Prior Function Prior Level of Function : Independent/Modified Independent;Working/employed;Driving (Pt works 2 days/week at a [employer].)             Mobility Comments: Independent without an AD. Climbed stairs using railings. ADLs Comments: Independent with all ADLs, drives, and manages meds.     Extremity/Trunk Assessment   Upper Extremity Assessment Upper Extremity Assessment: Overall WFL for tasks assessed    Lower Extremity Assessment Lower Extremity Assessment: Overall WFL for tasks assessed    Cervical / Trunk Assessment Cervical / Trunk Assessment: Normal  Communication   Communication Communication: No apparent difficulties    Cognition Arousal: Alert Behavior During Therapy: WFL for tasks assessed/performed   PT - Cognitive impairments: No apparent impairments                         Following commands: Intact       Cueing Cueing Techniques: Verbal cues     General Comments General comments (skin integrity, edema, etc.): Reviewed the importance of having staff present when attempting any mobility. Pt desaturated to 85% on RA when mobilizing, required 2L O2 via [place] to recover to >88%. Pt left on 2L in room.    Exercises Other Exercises Other Exercises: Incentive Spirometer (Educated pt on the use of the device and instructed her to complete 10 reps each hour.)   Assessment/Plan    PT Assessment Patient needs continued PT services  PT Problem List Decreased  activity tolerance;Decreased balance;Decreased knowledge of use of DME;Cardiopulmonary status limiting activity       PT Treatment Interventions DME instruction;Gait training;Stair training;Functional mobility training;Therapeutic activities;Therapeutic exercise;Balance training;Neuromuscular re-education;Patient/family education    PT Goals (Current goals can be found in the Care Plan section)  Acute Rehab PT Goals Patient Stated Goal: Go Home without oxygen PT Goal Formulation: With patient/family Time For Goal Achievement: 11/01/23 Potential to Achieve Goals: Good    Frequency Min 1X/week     Co-evaluation               AM-PAC PT "6 Clicks" Mobility  Outcome Measure Help needed turning from your back to your side while in a flat bed without using bedrails?: A Little Help needed moving from lying on your back to sitting on the side of a flat bed without using bedrails?: A Little Help needed moving to and from a bed to a chair (including a wheelchair)?: A Little Help needed standing up from a chair using your arms (e.g., wheelchair or bedside chair)?: A Little Help needed to walk in hospital room?: A Little Help needed climbing 3-5 steps with a railing? : A Little 6 Click Score: 18    End of Session  Equipment Utilized During Treatment: Gait belt;Oxygen Activity Tolerance: Patient tolerated treatment well Patient left: in bed;with call bell/phone within reach Nurse Communication: Mobility status PT Visit Diagnosis: Unsteadiness on feet (R26.81);Other abnormalities of gait and mobility (R26.89)    Time: 1610-9604 PT Time Calculation (min) (ACUTE ONLY): 30 min   Charges:   PT Evaluation $PT Eval Moderate Complexity: 1 Mod PT Treatments $Gait Training: 8-22 mins PT General Charges $$ ACUTE PT VISIT: 1 Visit         Cheri Guppy, PT, DPT Acute Rehabilitation Services Office: [phone] Secure Chat Preferred  Richardson Chiquito 10/18/2023, 1:16 PM

## 2023-10-18 NOTE — Plan of Care (Signed)
Problem: Education: Goal: Knowledge of General Education information will improve Description Including pain rating scale, medication(s)/side effects and non-pharmacologic comfort measures Outcome: Progressing   Problem: Clinical Measurements: Goal: Diagnostic test results will improve Outcome: Progressing Goal: Respiratory complications will improve Outcome: Progressing

## 2023-10-18 NOTE — Progress Notes (Signed)
Mobility Specialist Progress Note:   10/18/23 1002  Mobility  Activity Ambulated with assistance in hallway  Level of Assistance Contact guard assist, steadying assist  Assistive Device None  Distance Ambulated (ft) 120 ft  Activity Response Tolerated fair  Mobility Referral Yes  Mobility visit 1 Mobility  Mobility Specialist Start Time (ACUTE ONLY) 1002  Mobility Specialist Stop Time (ACUTE ONLY) 1016  Mobility Specialist Time Calculation (min) (ACUTE ONLY) 14 min   Pt agreeable to mobility. Contact assist needed for ambulation d/t L hip "giving out" at random. Attempted ambulation on RA, SpO2 down to 84%. Required 3LO2 to maintain SpO2 WFL. Pt back in bed with all needs met.  Addison Lank Mobility Specialist Please contact via SecureChat or  Rehab office at [phone]

## 2023-10-18 NOTE — Progress Notes (Signed)
PROGRESS NOTE  Erin Li ZOX:[id number] DOB: [birth year] DOA: 10/17/2023 PCP: [name], DO   LOS: 1 day   Brief narrative:   Erin Li is a 83 y.o. female with medical history significant of COPD, HTN, hyperlipidemia CAD who [facility] with shortness of breath and cough with some gray-colored sputum production.  Shortness of breath mostly exertional with no PND.  In the ED, labs were unremarkable.  BNP elevated at 206.  COVID influenza and RSV was negative.  TSH was 0.7.  She was noted to have A-fib with RVR-and possible new onset CHF exacerbation +/- COPD exacerbation.   Patient was given steroids/bronchodilators/started on IV Cardizem/IV heparin and subsequently admitted to the hospital for further evaluation and treatment.  Assessment/Plan: Principal Problem:   New onset atrial fibrillation (HCC)  A-fib with RVR On IV Cardizem drip.  On Cardizem orally at home.  Seen by Dr. Jacinto Halim in the past.  Was initially on IV heparin which was transitioned to Eliquis, check 2D echocardiogram.  TSH of 0.7.  Still A-fib with RVR.  Denies chest pain.  Acute congestive heart failure-possibly new onset diastolic heart failure Had exertional dyspnea with lower extremity edema.  Check 2D echocardiogram.  Continue IV Lasix.  Strict intake and output charting, daily weights.  Will get cardiology evaluation this time.  Possible COPD exacerbation Interstitial lung disease Continue steroid taper-and scheduled bronchodilators.  Follow-up with Dr. Marchelle Gearing as outpatient.  Negative CRP.  COVID influenza and RSV was negative.  Continue supportive care incentive spirometry.    HTN Continue Cardizem from home when orally switched.   hyperlipidemia Continue Lipitor   GERD PPI     DVT prophylaxis:  apixaban (ELIQUIS) tablet 5 mg   Disposition: Likely home in 1 to 2 days  Status is: Inpatient Remains inpatient appropriate because: Congestive heart failure,  A-fib with RVR, pending clinical improvement, cardiology evaluation.    Code Status:     Code Status: Full Code  Family Communication: Spoke with the patient's spouse at bedside  Consultants: Will consult cardiology  Procedures: None  Anti-infectives:  None  Anti-infectives (From admission, onward)    None        Subjective: Today, patient was seen and examined at bedside.  Patient complains of shortness of breath especially on exertion.  Denies any chest pain today.  No nausea vomiting fever or chills.  Has some dry cough and some runny nose as well.  Objective: Vitals:   10/18/23 0919 10/18/23 0923  BP:  (!) 105/57  Pulse: 79 91  Resp: 16   Temp:    SpO2: 94% 92%    Intake/Output Summary (Last 24 hours) at 10/18/2023 1137 Last data filed at 10/18/2023 0835 Gross per 24 hour  Intake 699.41 ml  Output 1200 ml  Net -500.59 ml   Filed Weights   10/17/23 0840 10/17/23 0846 10/18/23 0516  Weight: 66 kg 71.1 kg 66.8 kg   Body mass index is 24.5 kg/m.   Physical Exam:  GENERAL: Patient is alert awake and oriented. Not in obvious distress.  Mildly anxious, Communicative, elderly female. HENT: No scleral pallor or icterus. Pupils equally reactive to light. Oral mucosa is moist NECK: is supple, no gross swelling noted. CHEST: Diminished breath sounds bilaterally.  Coarse breath sounds noted.  Crackles noted over the base CVS: S1 and S2 heard, no murmur.  Irregularly irregular rhythm with tachycardia. ABDOMEN: Soft, non-tender, bowel sounds are present. EXTREMITIES: Trace pedal edema. CNS: Cranial nerves are intact.  No focal motor deficits. SKIN: warm and dry without rashes.  Data Review: I have personally reviewed the following laboratory data and studies,  CBC: Recent Labs  Lab 10/17/23 0805  WBC 5.9  NEUTROABS 3.6  HGB 13.6  HCT 39.7  MCV 98.8  PLT 264   Basic Metabolic Panel: Recent Labs  Lab 10/17/23 0801 10/18/23 0247  NA 141 139  K 4.0  3.8  CL 108 107  CO2 23 21*  GLUCOSE 111* 154*  BUN 19 18  CREATININE 0.81 0.92  CALCIUM 9.3 8.7*   Liver Function Tests: No results for input(s): "AST", "ALT", "ALKPHOS", "BILITOT", "PROT", "ALBUMIN" in the last 168 hours. No results for input(s): "LIPASE", "AMYLASE" in the last 168 hours. No results for input(s): "AMMONIA" in the last 168 hours. Cardiac Enzymes: No results for input(s): "CKTOTAL", "CKMB", "CKMBINDEX", "TROPONINI" in the last 168 hours. BNP (last 3 results) Recent Labs    10/17/23 0810  BNP 206.7*    ProBNP (last 3 results) No results for input(s): "PROBNP" in the last 8760 hours.  CBG: No results for input(s): "GLUCAP" in the last 168 hours. Recent Results (from the past 240 hours)  Resp panel by RT-PCR (RSV, Flu A&B, Covid) Anterior Nasal Swab     Status: None   Collection Time: 10/17/23  8:01 AM   Specimen: Anterior Nasal Swab  Result Value Ref Range Status   SARS Coronavirus 2 by RT PCR NEGATIVE NEGATIVE Final    Comment: (NOTE) SARS-CoV-2 target nucleic acids are NOT DETECTED.  The SARS-CoV-2 RNA is generally detectable in upper respiratory specimens during the acute phase of infection. The lowest concentration of SARS-CoV-2 viral copies this assay can detect is 138 copies/mL. A negative result does not preclude SARS-Cov-2 infection and should not be used as the sole basis for treatment or other patient management decisions. A negative result may occur with  improper specimen collection/handling, submission of specimen other than nasopharyngeal swab, presence of viral mutation(s) within the areas targeted by this assay, and inadequate number of viral copies(<138 copies/mL). A negative result must be combined with clinical observations, patient history, and epidemiological information. The expected result is Negative.  Fact Sheet for Patients:  BloggerCourse.com  Fact Sheet for Healthcare Providers:   SeriousBroker.it  This test is no t yet approved or cleared by the [place] FDA and  has been authorized for detection and/or diagnosis of SARS-CoV-2 by FDA under an Emergency Use Authorization (EUA). This EUA will remain  in effect (meaning this test can be used) for the duration of the COVID-19 declaration under Section 564(b)(1) of the Act, 21 U.S.C.section 360bbb-3(b)(1), unless the authorization is terminated  or revoked sooner.       Influenza A by PCR NEGATIVE NEGATIVE Final   Influenza B by PCR NEGATIVE NEGATIVE Final    Comment: (NOTE) The Xpert Xpress SARS-CoV-2/FLU/RSV plus assay is intended as an aid in the diagnosis of influenza from Nasopharyngeal swab specimens and should not be used as a sole basis for treatment. Nasal washings and aspirates are unacceptable for Xpert Xpress SARS-CoV-2/FLU/RSV testing.  Fact Sheet for Patients: BloggerCourse.com  Fact Sheet for Healthcare Providers: SeriousBroker.it  This test is not yet approved or cleared by the [place] FDA and has been authorized for detection and/or diagnosis of SARS-CoV-2 by FDA under an Emergency Use Authorization (EUA). This EUA will remain in effect (meaning this test can be used) for the duration of the COVID-19 declaration under Section 564(b)(1) of the Act, 21 U.S.C.  section 360bbb-3(b)(1), unless the authorization is terminated or revoked.     Resp Syncytial Virus by PCR NEGATIVE NEGATIVE Final    Comment: (NOTE) Fact Sheet for Patients: BloggerCourse.com  Fact Sheet for Healthcare Providers: SeriousBroker.it  This test is not yet approved or cleared by the [place] FDA and has been authorized for detection and/or diagnosis of SARS-CoV-2 by FDA under an Emergency Use Authorization (EUA). This EUA will remain in effect (meaning this test can be used) for  the duration of the COVID-19 declaration under Section 564(b)(1) of the Act, 21 U.S.C. section 360bbb-3(b)(1), unless the authorization is terminated or revoked.  Performed at [facility], [address], [place], [place] [address]      Studies: DG Chest 2 View Result Date: 10/17/2023 CLINICAL DATA:  Shortness of breath.  Productive cough. EXAM: CHEST - 2 VIEW COMPARISON:  CT scan chest from 09/28/2023. FINDINGS: There are increased interstitial markings throughout bilateral lungs, which appears more pronounced than the prior exams and concerning for underlying mild pulmonary edema. Patient has mild underlying chronic interstitial lung disease, better evaluated on the recent chest CT scan. There is also blunting of bilateral costophrenic angles, suggesting bilateral trace pleural effusions, which is also new since the prior study. There are probable atelectatic changes at the lung bases. No dense consolidation or lung collapse. No pneumothorax. Mildly enlarged cardio-mediastinal silhouette. No acute osseous abnormalities. The soft tissues are within normal limits. IMPRESSION: *Redemonstration of chronic interstitial lung disease. However, there are increased interstitial markings since the prior exam and new bilateral trace pleural effusions, concerning for superimposed congestive heart failure/pulmonary edema. Correlate clinically. Electronically Signed   By: Jules Schick M.D.   On: 10/17/2023 09:18      Joycelyn Das, MD  [facility] 10/18/2023  If 7PM-7AM, please contact night-coverage

## 2023-10-18 NOTE — Hospital Course (Signed)
Erin Li is a 83 y.o. female with medical history significant of COPD, HTN, hyperlipidemia CAD who presented to [facility] with shortness of breath and cough with some gray-colored sputum production.  Shortness of breath mostly exertional with no PND.  In the ED, labs were unremarkable.  BNP elevated at 206.  COVID influenza and RSV was negative.  TSH was 0.7.  She was noted to have A-fib with RVR-and possible new onset CHF exacerbation +/- COPD exacerbation.   Patient was given steroids/bronchodilators/started on IV Cardizem/IV heparin and subsequently admitted to the hospital for further evaluation and treatment.   A-fib with RVR Received Cardizem infusion.  On Cardizem orally at home.  Was initially on IV heparin which was transitioned to Eliquis check 2D echocardiogram.  TSH of 0.7.  Acute congestive heart failure-possibly new onset diastolic heart failure Had exertional dyspnea with lower extremity edema.  Check 2D echocardiogram.  Continue IV Lasix.  Strict intake and output charting, daily weights.  Possible COPD exacerbation Interstitial lung disease Continue steroid taper-and scheduled bronchodilators.  Negative CRP.  COVID influenza and RSV was negative.    HTN Continue Cardizem from home when orally switched.   HLD Lipitor 10.   GERD PPI

## 2023-10-19 DIAGNOSIS — I4891 Unspecified atrial fibrillation: Secondary | ICD-10-CM | POA: Diagnosis not present

## 2023-10-19 LAB — BASIC METABOLIC PANEL
Anion gap: 9 (ref 5–15)
BUN: 20 mg/dL (ref 8–23)
CO2: 23 mmol/L (ref 22–32)
Calcium: 8.7 mg/dL — ABNORMAL LOW (ref 8.9–10.3)
Chloride: 107 mmol/L (ref 98–111)
Creatinine, Ser: 0.84 mg/dL (ref 0.44–1.00)
GFR, Estimated: >60 mL/min (ref >60)
Glucose, Bld: 124 mg/dL — ABNORMAL HIGH (ref 70–99)
Potassium: 3.9 mmol/L (ref 3.5–5.1)
Sodium: 139 mmol/L (ref 135–145)

## 2023-10-19 LAB — LIPID PANEL
Cholesterol: 128 mg/dL (ref 0–200)
HDL: 39 mg/dL — ABNORMAL LOW (ref >40)
LDL Cholesterol: 78 mg/dL (ref 0–99)
Total CHOL/HDL Ratio: 3.3 {ratio}
Triglycerides: 56 mg/dL (ref <150)
VLDL: 11 mg/dL (ref 0–40)

## 2023-10-19 LAB — MAGNESIUM: Magnesium: 2 mg/dL (ref 1.7–2.4)

## 2023-10-19 LAB — CBC
HCT: 33.5 % — ABNORMAL LOW (ref 36.0–46.0)
Hemoglobin: 11.5 g/dL — ABNORMAL LOW (ref 12.0–15.0)
MCH: 34.2 pg — ABNORMAL HIGH (ref 26.0–34.0)
MCHC: 34.3 g/dL (ref 30.0–36.0)
MCV: 99.7 fL (ref 80.0–100.0)
Platelets: 230 10*3/uL (ref 150–400)
RBC: 3.36 MIL/uL — ABNORMAL LOW (ref 3.87–5.11)
RDW: 14.5 % (ref 11.5–15.5)
WBC: 10.3 10*3/uL (ref 4.0–10.5)
nRBC: 0 % (ref 0.0–0.2)

## 2023-10-19 MED ORDER — AMIODARONE HCL IN DEXTROSE 360-4.14 MG/200ML-% IV SOLN
30.0000 mg/h | INTRAVENOUS | Status: DC
  Administered 2023-10-20: 30 mg/h via INTRAVENOUS
  Filled 2023-10-19 (×2): qty 200

## 2023-10-19 MED ORDER — AMIODARONE LOAD VIA INFUSION
150.0000 mg | Freq: Once | INTRAVENOUS | Status: AC
  Administered 2023-10-19: 150 mg via INTRAVENOUS
  Filled 2023-10-19: qty 83.34

## 2023-10-19 MED ORDER — AMIODARONE HCL IN DEXTROSE 360-4.14 MG/200ML-% IV SOLN
60.0000 mg/h | INTRAVENOUS | Status: DC
  Administered 2023-10-19: 60 mg/h via INTRAVENOUS
  Filled 2023-10-19: qty 200

## 2023-10-19 MED ORDER — FLUTICASONE PROPIONATE 50 MCG/ACT NA SUSP
1.0000 | Freq: Every day | NASAL | Status: DC
  Administered 2023-10-19 – 2023-10-20 (×2): 1 via NASAL
  Filled 2023-10-19: qty 16

## 2023-10-19 MED ORDER — SACUBITRIL-VALSARTAN 24-26 MG PO TABS
1.0000 | ORAL_TABLET | Freq: Two times a day (BID) | ORAL | Status: DC
  Administered 2023-10-19: 1 via ORAL
  Filled 2023-10-19: qty 1

## 2023-10-19 MED ORDER — METOPROLOL TARTRATE 25 MG PO TABS
25.0000 mg | ORAL_TABLET | Freq: Two times a day (BID) | ORAL | Status: DC
  Administered 2023-10-19: 25 mg via ORAL
  Filled 2023-10-19: qty 1

## 2023-10-19 MED ORDER — MELATONIN 3 MG PO TABS
3.0000 mg | ORAL_TABLET | Freq: Once | ORAL | Status: AC
  Administered 2023-10-19: 3 mg via ORAL
  Filled 2023-10-19: qty 1

## 2023-10-19 NOTE — Progress Notes (Addendum)
Paged at the bedside due to RVR with rates in the 120s to 160s.  In brief the patient had been maintaining NSR but converted to atrial fibrillation today this afternoon with above rates.  Seen at the bedside, BP 112/70.  Patient asymptomatic without any complaints of chest pain or shortness of breath.  Mildly tachypneic but overall stable.  Plan: Given one dose of Lopressor 25mg  at 1400 with mild improvement in rates, still consistently 120-140s. Starting IV amio with soft pressures  92-100 systolics.  Can continue her bisoprolol 5 mg for now, continue eliquis 5mg  BID Stopping Entresto until we can get heart rates better controlled can consider resuming tomorrow if stable.

## 2023-10-19 NOTE — Consult Note (Signed)
[facility] Liaison Consult Note   10/19/2023  Erin Li November 16, 1940 [id number]  Insurance: [place] [employer] Medicare  Primary Care Provider: [name], DO with [facility], this provider is listed for the transition of care follow up appointments  and [facility] calls   [facility] Liaison patient at [facility] rounding.    The patient was screened for hospitalization with new diagnosis discussed with Inpatient TOC for needs, medium risk score for unplanned readmission risk 1 hospital admissions in 6 months.  The patient was assessed for potential [facility] Coordination service needs for post hospital transition for care coordination. Review of patient's electronic medical record reveals patient is admitted with new onset AF.[name]   Plan: [facility] Liaison will continue to follow progress and disposition to asess for post hospital community care coordination/management needs.  Referral request for community care coordination: anticipate patient to have post hospital TOC calls.   [facility], Population Health does not replace or interfere with any arrangements made by the Inpatient Transition of Care team.   For questions contact:   Charlesetta Shanks, RN, BSN, CCM [place]  [facility], [facility] Liaison Direct Dial: [phone] or secure chat Email: [email or web address]

## 2023-10-19 NOTE — Progress Notes (Signed)
MEWS Progress Note  Patient Details Name: Erin Li MRN: [id number] DOB: 20-Sep-1940 Today's Date: 10/19/2023   MEWS Flowsheet Documentation:  Assess: MEWS Score Temp: 97.7 F (36.5 C) BP: (!) 99/53 MAP (mmHg): (!) 62 Pulse Rate: (!) 110 ECG Heart Rate: (!) 110 Resp: 18 Level of Consciousness: Alert SpO2: 94 % O2 Device: Nasal Cannula O2 Flow Rate (L/min): 3 L/min FiO2 (%): 32 % Assess: MEWS Score MEWS Temp: 0 MEWS Systolic: 1 MEWS Pulse: 1 MEWS RR: 0 MEWS LOC: 0 MEWS Score: 2 MEWS Score Color: Yellow Assess: SIRS CRITERIA SIRS Temperature : 0 SIRS Respirations : 0 SIRS Pulse: 1 SIRS WBC: 0 SIRS Score Sum : 1 SIRS Temperature : 0 SIRS Pulse: 1 SIRS Respirations : 0 SIRS WBC: 0 SIRS Score Sum : 1 Assess: if the MEWS score is Yellow or Red Were vital signs accurate and taken at a resting state?: Yes Does the patient meet 2 or more of the SIRS criteria?: No MEWS guidelines implemented : Yes, yellow Treat MEWS Interventions: Considered administering scheduled or prn medications/treatments as ordered Take Vital Signs Increase Vital Sign Frequency : Yellow: Q2hr x1, continue Q4hrs until patient remains green for 12hrs Escalate MEWS: Escalate: Yellow: Discuss with charge nurse and consider notifying provider and/or RRT

## 2023-10-19 NOTE — Progress Notes (Signed)
Progress Note  Patient Name: Erin Li Date of Encounter: 10/19/2023  Primary Cardiologist: Yates Decamp, MD   Subjective   Patient seen examined at her bedside.  Offers no complaints at this time.  Inpatient Medications    Scheduled Meds:  apixaban  5 mg Oral BID   arformoterol  15 mcg Nebulization BID   atorvastatin  10 mg Oral Daily   bisoprolol  5 mg Oral Daily   budesonide (PULMICORT) nebulizer solution  0.25 mg Nebulization BID   furosemide  40 mg Intravenous Daily   guaiFENesin  600 mg Oral BID   levalbuterol  0.63 mg Nebulization BID   And   ipratropium  0.5 mg Nebulization BID   pantoprazole  40 mg Oral Q1200   predniSONE  40 mg Oral Q breakfast   revefenacin  175 mcg Nebulization Daily   sodium chloride flush  3 mL Intravenous Q12H   Continuous Infusions:  PRN Meds: acetaminophen **OR** acetaminophen, albuterol, metoprolol tartrate, ondansetron **OR** ondansetron (ZOFRAN) IV, polyethylene glycol, sodium chloride flush   Vital Signs    Vitals:   10/18/23 2136 10/19/23 0020 10/19/23 0418 10/19/23 0726  BP: 115/63 124/67 126/79 (!) 139/102  Pulse: 81 84 83 89  Resp: 18 18 18 20   Temp: 97.7 F (36.5 C) 97.8 F (36.6 C) 98.4 F (36.9 C) 98.3 F (36.8 C)  TempSrc: Oral Oral Oral Oral  SpO2: 92% 91% 93% (!) 83%  Weight:   66.9 kg   Height:        Intake/Output Summary (Last 24 hours) at 10/19/2023 0759 Last data filed at 10/19/2023 0730 Gross per 24 hour  Intake 720 ml  Output 2400 ml  Net -1680 ml   Filed Weights   10/17/23 0846 10/18/23 0516 10/19/23 0418  Weight: 71.1 kg 66.8 kg 66.9 kg    Telemetry     - Personally Reviewed  ECG     - Personally Reviewed  Physical Exam   General: Comfortable, Head: Atraumatic, normal size  Eyes: PEERLA, EOMI  Neck: Supple, normal JVD Cardiac: Normal S1, S2; RRR; no murmurs, rubs, or gallops Lungs: Clear to auscultation bilaterally Abd: Soft, nontender, no hepatomegaly  Ext: warm, no  edema Musculoskeletal: No deformities, BUE and BLE strength normal and equal Skin: Warm and dry, no rashes   Neuro: Alert and oriented to person, place, time, and situation, CNII-XII grossly intact, no focal deficits  Psych: Normal mood and affect   Labs    Chemistry Recent Labs  Lab 10/17/23 0801 10/18/23 0247 10/19/23 0242  NA 141 139 139  K 4.0 3.8 3.9  CL 108 107 107  CO2 23 21* 23  GLUCOSE 111* 154* 124*  BUN 19 18 20   CREATININE 0.81 0.92 0.84  CALCIUM 9.3 8.7* 8.7*  GFRNONAA >60 >60 >60  ANIONGAP 10 11 9      Hematology Recent Labs  Lab 10/17/23 0805 10/19/23 0242  WBC 5.9 10.3  RBC 4.02 3.36*  HGB 13.6 11.5*  HCT 39.7 33.5*  MCV 98.8 99.7  MCH 33.8 34.2*  MCHC 34.3 34.3  RDW 14.2 14.5  PLT 264 230    Cardiac EnzymesNo results for input(s): "TROPONINI" in the last 168 hours. No results for input(s): "TROPIPOC" in the last 168 hours.   BNP Recent Labs  Lab 10/17/23 0810  BNP 206.7*     DDimer No results for input(s): "DDIMER" in the last 168 hours.   Radiology    ECHOCARDIOGRAM COMPLETE Result Date: 10/18/2023  ECHOCARDIOGRAM REPORT   Patient Name:   Erin Li Date of Exam: 10/18/2023 Medical Rec #:  [id number]          Height:       65.0 in Accession #:    [id number]         Weight:       147.2 lb Date of Birth:  March 09, 1941         BSA:          1.737 m Patient Age:    82 years           BP:           105/57 mmHg Patient Gender: F                  HR:           85 bpm. Exam Location:  Inpatient Procedure: 2D Echo, Cardiac Doppler and Color Doppler (Both Spectral and Color            Flow Doppler were utilized during procedure). Indications:    atrial fibrillation  History:        Patient has prior history of Echocardiogram examinations, most                 recent 06/07/2017. COPD, Arrythmias:Atrial Fibrillation; Risk                 Factors:Former Smoker, Hypertension and Dyslipidemia.  Sonographer:    Delcie Roch RDCS Referring Phys: Sharla Kidney  Dewayne Shorter M GHIMIRE IMPRESSIONS  1. Left ventricular ejection fraction, by estimation, is 40 to 45%. The left ventricle has mildly decreased function. The left ventricle demonstrates global hypokinesis. Left ventricular diastolic parameters are consistent with Grade II diastolic dysfunction (pseudonormalization).  2. Right ventricular systolic function is mildly reduced. The right ventricular size is mildly enlarged. There is moderately elevated pulmonary artery systolic pressure. The estimated right ventricular systolic pressure is 59.3 mmHg.  3. Left atrial size was moderately dilated.  4. Right atrial size was moderately dilated.  5. The mitral valve is normal in structure. 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 No evidence of mitral stenosis. Moderate mitral annular calcification.  6. Tricuspid valve regurgitation is moderate.  7. The aortic valve is tricuspid. There is mild calcification of the aortic valve. Aortic valve regurgitation is not visualized. No aortic stenosis is present.  8. The inferior vena cava is normal in size with <50% respiratory variability, suggesting right atrial pressure of 8 mmHg. FINDINGS  Left Ventricle: Left ventricular ejection fraction, by estimation, is 40 to 45%. The left ventricle has mildly decreased function. The left ventricle demonstrates global hypokinesis. Strain imaging was not performed. The left ventricular internal cavity  size was normal in size. There is no left ventricular hypertrophy. Left ventricular diastolic parameters are consistent with Grade II diastolic dysfunction (pseudonormalization). Right Ventricle: The right ventricular size is mildly enlarged. No increase in right ventricular wall thickness. Right ventricular systolic function is mildly reduced. There is moderately elevated pulmonary artery systolic pressure. The tricuspid regurgitant velocity is 3.58 m/s, and with an assumed right atrial pressure of 8 mmHg, the estimated  right ventricular systolic pressure is 59.3 mmHg. Left Atrium: Left atrial size was moderately dilated. Right Atrium: Right atrial size was moderately dilated. Pericardium: Trivial pericardial effusion is present. Mitral Valve: The mitral valve is normal in structure. Moderate mitral annular calcification. Moderate mitral valve regurgitation. No evidence of mitral valve stenosis. Tricuspid Valve: The tricuspid valve is  normal in structure. Tricuspid valve regurgitation is moderate. Aortic Valve: The aortic valve is tricuspid. There is mild calcification of the aortic valve. Aortic valve regurgitation is not visualized. No aortic stenosis is present. Pulmonic Valve: The pulmonic valve was normal in structure. Pulmonic valve regurgitation is trivial. Aorta: The aortic root is normal in size and structure. Venous: The inferior vena cava is normal in size with less than 50% respiratory variability, suggesting right atrial pressure of 8 mmHg. IAS/Shunts: No atrial level shunt detected by color flow Doppler. Additional Comments: 3D imaging was not performed.  LEFT VENTRICLE PLAX 2D LVIDd:         4.70 cm LVIDs:         3.70 cm LV PW:         1.10 cm LV IVS:        1.00 cm LVOT diam:     1.80 cm LV SV:         41 LV SV Index:   24 LVOT Area:     2.54 cm  RIGHT VENTRICLE          IVC RV Basal diam:  2.50 cm  IVC diam: 1.90 cm LEFT ATRIUM             Index        RIGHT ATRIUM           Index LA diam:        3.90 cm 2.25 cm/m   RA Area:     16.40 cm LA Vol (A2C):   55.9 ml 32.19 ml/m  RA Volume:   43.10 ml  24.82 ml/m LA Vol (A4C):   52.4 ml 30.18 ml/m LA Biplane Vol: 57.9 ml 33.34 ml/m  AORTIC VALVE LVOT Vmax:   82.90 cm/s LVOT Vmean:  50.900 cm/s LVOT VTI:    0.162 m  AORTA Ao Root diam: 2.70 cm Ao Asc diam:  3.20 cm TRICUSPID VALVE TR Peak grad:   51.3 mmHg TR Vmax:        358.00 cm/s  SHUNTS Systemic VTI:  0.16 m Systemic Diam: 1.80 cm Dalton McleanMD Electronically signed by Wilfred Lacy Signature Date/Time:  10/18/2023/2:34:54 PM    Final    DG Chest 2 View Result Date: 10/17/2023 CLINICAL DATA:  Shortness of breath.  Productive cough. EXAM: CHEST - 2 VIEW COMPARISON:  CT scan chest from 09/28/2023. FINDINGS: There are increased interstitial markings throughout bilateral lungs, which appears more pronounced than the prior exams and concerning for underlying mild pulmonary edema. Patient has mild underlying chronic interstitial lung disease, better evaluated on the recent chest CT scan. There is also blunting of bilateral costophrenic angles, suggesting bilateral trace pleural effusions, which is also new since the prior study. There are probable atelectatic changes at the lung bases. No dense consolidation or lung collapse. No pneumothorax. Mildly enlarged cardio-mediastinal silhouette. No acute osseous abnormalities. The soft tissues are within normal limits. IMPRESSION: *Redemonstration of chronic interstitial lung disease. However, there are increased interstitial markings since the prior exam and new bilateral trace pleural effusions, concerning for superimposed congestive heart failure/pulmonary edema. Correlate clinically. Electronically Signed   By: Jules Schick M.D.   On: 10/17/2023 09:18    Cardiac Studies   Prior echo   Patient Profile     83 y.o. 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 Continue Eliquis 5 mg twice a day  Will start entresto 24-26 mg twice day, continue Bisoprolol 5 mg daily if her weight does not improve okay to increase her to 10 mg of the bisoprolol.  He still needs the IV Lasix this morning.  No angina symptoms continue patient on her Lipitor.  Aspirin  stopped she is not going to be on Eliquis      For questions or updates, please contact [facility] Please consult [email or web address] for contact info under Cardiology/STEMI.      Signed, Devontre Siedschlag, DO  10/19/2023, 7:59 AM

## 2023-10-19 NOTE — Progress Notes (Signed)
Page sent to Cardiology -- Dr. Servando Salina regarding changes in heart rate.  EKG obtained and showed Afib with RVR.   Dr. Pokhrel/ Attending also made aware and awaiting cardiology insight.  Cardiology PA S. Rolly Salter, PA-C at bedside to round and discuss patient.   Awaiting new orders.

## 2023-10-19 NOTE — Progress Notes (Addendum)
PROGRESS NOTE  AVNI TRAORE WJX:[id number] DOB: 1940/09/17 DOA: 10/17/2023 PCP: [name], DO   LOS: 2 days   Brief narrative:   Erin Li is a 83 y.o. female with medical history significant of COPD, HTN, hyperlipidemia CAD who presented to [facility] with shortness of breath and cough with some gray-colored sputum production, shortness of breath mostly exertional with no PND.  In the ED, labs were unremarkable.  BNP elevated at 206.  COVID influenza and RSV was negative.  TSH was 0.7.  She was noted to have A-fib with RVR-and possible new onset CHF exacerbation +/- COPD exacerbation.   Patient was given steroids/bronchodilators/started on IV Cardizem/IV heparin and subsequently admitted to the hospital for further evaluation and treatment.  Assessment/Plan: Principal Problem:   New onset atrial fibrillation (HCC)  A-fib with RVR Was initially started on IV Cardizem drip since patient was on Cardizem orally at home.  This has been discontinued since her 2D echocardiogram showed decreased LV function with LV ejection fraction of 40 to 45% with global hypokinesis and grade 2 diastolic dysfunction..  Seen by Dr. Jacinto Halim in the past.  Was initially on IV heparin which was transitioned to Eliquis, TSH of 0.7.  Discontinue albuterol nebulizer.  Seen by cardiology and has been started on bisoprolol p.o. daily.  Metoprolol has been added as well including as needed metoprolol by cardiology team..  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 Had exertional dyspnea with lower extremity edema.   2D echocardiogram with reduced LV function..  Continue IV Lasix.  Strict intake and output charting, daily weights.  Will use the following at this time.  Possible COPD exacerbation Interstitial lung disease Continue steroid taper-and scheduled bronchodilators.  Follow-up with Dr. Marchelle Gearing as outpatient.  Negative CRP.  COVID influenza and RSV  was negative.  Continue supportive care incentive spirometry.  Currently on prednisone 40 mg daily.   HTN Cardizem has been discontinued.  On bisoprolol and metoprolol at this time.   hyperlipidemia Continue Lipitor   GERD PPI     DVT prophylaxis:  apixaban (ELIQUIS) tablet 5 mg   Disposition: Likely home in 1 to 2 days when okay with cardiology.  Status is: Inpatient Remains inpatient appropriate because: Congestive heart failure, A-fib with RVR, pending clinical improvement, cardiology follow-up.    Code Status:     Code Status: Full Code  Family Communication: Spoke with the patient's spouse at bedside on 08/16/2024  Consultants: Cardiology  Procedures: None  Anti-infectives:  None  Anti-infectives (From admission, onward)    None      Subjective: Today, patient was seen and examined at bedside.  Patient complains of shortness of breath especially on exertion, denies any chest pain.  Denies any nausea, vomiting, fever, chills or rigor.  Has some dry cough.    Objective: Vitals:   10/19/23 1344 10/19/23 1446  BP: (!) 95/57 (!) 115/58  Pulse: 96 (!) 108  Resp: 20 20  Temp:  97.8 F (36.6 C)  SpO2: 91% 93%    Intake/Output Summary (Last 24 hours) at 10/19/2023 1511 Last data filed at 10/19/2023 1231 Gross per 24 hour  Intake 598 ml  Output 2500 ml  Net -1902 ml   Filed Weights   10/17/23 0846 10/18/23 0516 10/19/23 0418  Weight: 71.1 kg 66.8 kg 66.9 kg   Body mass index is 24.55 kg/m.   Physical Exam:  GENERAL: Patient is alert awake and oriented. Not in obvious distress.  Mildly  anxious, Communicative, elderly female.  On nasal cannula oxygen. HENT: No scleral pallor or icterus. Pupils equally reactive to light. Oral mucosa is moist NECK: is supple, no gross swelling noted. CHEST: Diminished breath sounds bilaterally.  Coarse breath sounds noted.  Crackles noted over the base CVS: S1 and S2 heard, no murmur.  Irregularly irregular rhythm with  tachycardia. ABDOMEN: Soft, non-tender, bowel sounds are present. EXTREMITIES: Trace pedal edema. CNS: Cranial nerves are intact. No focal motor deficits. SKIN: warm and dry without rashes.  Data Review: I have personally reviewed the following laboratory data and studies,  CBC: Recent Labs  Lab 10/17/23 0805 10/19/23 0242  WBC 5.9 10.3  NEUTROABS 3.6  --   HGB 13.6 11.5*  HCT 39.7 33.5*  MCV 98.8 99.7  PLT 264 230   Basic Metabolic Panel: Recent Labs  Lab 10/17/23 0801 10/18/23 0247 10/19/23 0242  NA 141 139 139  K 4.0 3.8 3.9  CL 108 107 107  CO2 23 21* 23  GLUCOSE 111* 154* 124*  BUN 19 18 20   CREATININE 0.81 0.92 0.84  CALCIUM 9.3 8.7* 8.7*  MG  --   --  2.0   Liver Function Tests: No results for input(s): "AST", "ALT", "ALKPHOS", "BILITOT", "PROT", "ALBUMIN" in the last 168 hours. No results for input(s): "LIPASE", "AMYLASE" in the last 168 hours. No results for input(s): "AMMONIA" in the last 168 hours. Cardiac Enzymes: No results for input(s): "CKTOTAL", "CKMB", "CKMBINDEX", "TROPONINI" in the last 168 hours. BNP (last 3 results) Recent Labs    10/17/23 0810  BNP 206.7*    ProBNP (last 3 results) No results for input(s): "PROBNP" in the last 8760 hours.  CBG: No results for input(s): "GLUCAP" in the last 168 hours. Recent Results (from the past 240 hours)  Resp panel by RT-PCR (RSV, Flu A&B, Covid) Anterior Nasal Swab     Status: None   Collection Time: 10/17/23  8:01 AM   Specimen: Anterior Nasal Swab  Result Value Ref Range Status   SARS Coronavirus 2 by RT PCR NEGATIVE NEGATIVE Final    Comment: (NOTE) SARS-CoV-2 target nucleic acids are NOT DETECTED.  The SARS-CoV-2 RNA is generally detectable in upper respiratory specimens during the acute phase of infection. The lowest concentration of SARS-CoV-2 viral copies this assay can detect is 138 copies/mL. A negative result does not preclude SARS-Cov-2 infection and should not be used as the  sole basis for treatment or other patient management decisions. A negative result may occur with  improper specimen collection/handling, submission of specimen other than nasopharyngeal swab, presence of viral mutation(s) within the areas targeted by this assay, and inadequate number of viral copies(<138 copies/mL). A negative result must be combined with clinical observations, patient history, and epidemiological information. The expected result is Negative.  Fact Sheet for Patients:  BloggerCourse.com  Fact Sheet for Healthcare Providers:  SeriousBroker.it  This test is no t yet approved or cleared by the [place] FDA and  has been authorized for detection and/or diagnosis of SARS-CoV-2 by FDA under an Emergency Use Authorization (EUA). This EUA will remain  in effect (meaning this test can be used) for the duration of the COVID-19 declaration under Section 564(b)(1) of the Act, 21 U.S.C.section 360bbb-3(b)(1), unless the authorization is terminated  or revoked sooner.       Influenza A by PCR NEGATIVE NEGATIVE Final   Influenza B by PCR NEGATIVE NEGATIVE Final    Comment: (NOTE) The Xpert Xpress SARS-CoV-2/FLU/RSV plus assay is intended as  an aid in the diagnosis of influenza from Nasopharyngeal swab specimens and should not be used as a sole basis for treatment. Nasal washings and aspirates are unacceptable for Xpert Xpress SARS-CoV-2/FLU/RSV testing.  Fact Sheet for Patients: BloggerCourse.com  Fact Sheet for Healthcare Providers: SeriousBroker.it  This test is not yet approved or cleared by the [place] FDA and has been authorized for detection and/or diagnosis of SARS-CoV-2 by FDA under an Emergency Use Authorization (EUA). This EUA will remain in effect (meaning this test can be used) for the duration of the COVID-19 declaration under Section 564(b)(1) of the  Act, 21 U.S.C. section 360bbb-3(b)(1), unless the authorization is terminated or revoked.     Resp Syncytial Virus by PCR NEGATIVE NEGATIVE Final    Comment: (NOTE) Fact Sheet for Patients: BloggerCourse.com  Fact Sheet for Healthcare Providers: SeriousBroker.it  This test is not yet approved or cleared by the [place] FDA and has been authorized for detection and/or diagnosis of SARS-CoV-2 by FDA under an Emergency Use Authorization (EUA). This EUA will remain in effect (meaning this test can be used) for the duration of the COVID-19 declaration under Section 564(b)(1) of the Act, 21 U.S.C. section 360bbb-3(b)(1), unless the authorization is terminated or revoked.  Performed at [facility], [address] [place], [place] [address]      Studies: ECHOCARDIOGRAM COMPLETE Result Date: 10/18/2023    ECHOCARDIOGRAM REPORT   Patient Name:   DASHIA CALDEIRA Date of Exam: 10/18/2023 Medical Rec #:  [id number]          Height:       65.0 in Accession #:    [id number]         Weight:       147.2 lb Date of Birth:  1941/01/19         BSA:          1.737 m Patient Age:    82 years           BP:           105/57 mmHg Patient Gender: F                  HR:           85 bpm. Exam Location:  Inpatient Procedure: 2D Echo, Cardiac Doppler and Color Doppler (Both Spectral and Color            Flow Doppler were utilized during procedure). Indications:    atrial fibrillation  History:        Patient has prior history of Echocardiogram examinations, most                 recent 06/07/2017. COPD, Arrythmias:Atrial Fibrillation; Risk                 Factors:Former Smoker, Hypertension and Dyslipidemia.  Sonographer:    Delcie Roch RDCS Referring Phys: Sharla Kidney Dewayne Shorter M GHIMIRE IMPRESSIONS  1. Left ventricular ejection fraction, by estimation, is 40 to 45%. The left ventricle has mildly decreased function. The left ventricle  demonstrates global hypokinesis. Left ventricular diastolic parameters are consistent with Grade II diastolic dysfunction (pseudonormalization).  2. Right ventricular systolic function is mildly reduced. The right ventricular size is mildly enlarged. There is moderately elevated pulmonary artery systolic pressure. The estimated right ventricular systolic pressure is 59.3 mmHg.  3. Left atrial size was moderately dilated.  4. Right atrial size was moderately dilated.  5. The mitral valve is normal in structure. Moderate mitral  valve regurgitation, suspect atrial functional MR. No evidence of mitral stenosis. Moderate mitral annular calcification.  6. Tricuspid valve regurgitation is moderate.  7. The aortic valve is tricuspid. There is mild calcification of the aortic valve. Aortic valve regurgitation is not visualized. No aortic stenosis is present.  8. The inferior vena cava is normal in size with <50% respiratory variability, suggesting right atrial pressure of 8 mmHg. FINDINGS  Left Ventricle: Left ventricular ejection fraction, by estimation, is 40 to 45%. The left ventricle has mildly decreased function. The left ventricle demonstrates global hypokinesis. Strain imaging was not performed. The left ventricular internal cavity  size was normal in size. There is no left ventricular hypertrophy. Left ventricular diastolic parameters are consistent with Grade II diastolic dysfunction (pseudonormalization). Right Ventricle: The right ventricular size is mildly enlarged. No increase in right ventricular wall thickness. Right ventricular systolic function is mildly reduced. There is moderately elevated pulmonary artery systolic pressure. The tricuspid regurgitant velocity is 3.58 m/s, and with an assumed right atrial pressure of 8 mmHg, the estimated right ventricular systolic pressure is 59.3 mmHg. Left Atrium: Left atrial size was moderately dilated. Right Atrium: Right atrial size was moderately dilated.  Pericardium: Trivial pericardial effusion is present. Mitral Valve: The mitral valve is normal in structure. Moderate mitral annular calcification. Moderate mitral valve regurgitation. No evidence of mitral valve stenosis. Tricuspid Valve: The tricuspid valve is normal in structure. Tricuspid valve regurgitation is moderate. Aortic Valve: The aortic valve is tricuspid. There is mild calcification of the aortic valve. Aortic valve regurgitation is not visualized. No aortic stenosis is present. Pulmonic Valve: The pulmonic valve was normal in structure. Pulmonic valve regurgitation is trivial. Aorta: The aortic root is normal in size and structure. Venous: The inferior vena cava is normal in size with less than 50% respiratory variability, suggesting right atrial pressure of 8 mmHg. IAS/Shunts: No atrial level shunt detected by color flow Doppler. Additional Comments: 3D imaging was not performed.  LEFT VENTRICLE PLAX 2D LVIDd:         4.70 cm LVIDs:         3.70 cm LV PW:         1.10 cm LV IVS:        1.00 cm LVOT diam:     1.80 cm LV SV:         41 LV SV Index:   24 LVOT Area:     2.54 cm  RIGHT VENTRICLE          IVC RV Basal diam:  2.50 cm  IVC diam: 1.90 cm LEFT ATRIUM             Index        RIGHT ATRIUM           Index LA diam:        3.90 cm 2.25 cm/m   RA Area:     16.40 cm LA Vol (A2C):   55.9 ml 32.19 ml/m  RA Volume:   43.10 ml  24.82 ml/m LA Vol (A4C):   52.4 ml 30.18 ml/m LA Biplane Vol: 57.9 ml 33.34 ml/m  AORTIC VALVE LVOT Vmax:   82.90 cm/s LVOT Vmean:  50.900 cm/s LVOT VTI:    0.162 m  AORTA Ao Root diam: 2.70 cm Ao Asc diam:  3.20 cm TRICUSPID VALVE TR Peak grad:   51.3 mmHg TR Vmax:        358.00 cm/s  SHUNTS Systemic VTI:  0.16 m Systemic Diam: 1.80  cm Wilfred Lacy Electronically signed by Wilfred Lacy Signature Date/Time: 10/18/2023/2:34:54 PM    Final       Joycelyn Das, MD  [facility] 10/19/2023  If 7PM-7AM, please contact night-coverage

## 2023-10-19 NOTE — Plan of Care (Signed)

## 2023-10-19 NOTE — Care Management Important Message (Signed)
Important Message  Patient Details  Name: Erin Li MRN: [id number] Date of Birth: May 24, 1941   Important Message Given:  Yes - Medicare IM     Renie Ora 10/19/2023, 10:39 AM

## 2023-10-20 ENCOUNTER — Inpatient Hospital Stay (HOSPITAL_COMMUNITY): Payer: Medicare Other

## 2023-10-20 DIAGNOSIS — I5043 Acute on chronic combined systolic (congestive) and diastolic (congestive) heart failure: Secondary | ICD-10-CM | POA: Diagnosis not present

## 2023-10-20 DIAGNOSIS — J441 Chronic obstructive pulmonary disease with (acute) exacerbation: Secondary | ICD-10-CM | POA: Diagnosis not present

## 2023-10-20 DIAGNOSIS — I4891 Unspecified atrial fibrillation: Secondary | ICD-10-CM | POA: Diagnosis not present

## 2023-10-20 DIAGNOSIS — J9601 Acute respiratory failure with hypoxia: Secondary | ICD-10-CM | POA: Insufficient documentation

## 2023-10-20 LAB — CBC
HCT: 37 % (ref 36.0–46.0)
Hemoglobin: 12.4 g/dL (ref 12.0–15.0)
MCH: 33.1 pg (ref 26.0–34.0)
MCHC: 33.5 g/dL (ref 30.0–36.0)
MCV: 98.7 fL (ref 80.0–100.0)
Platelets: 236 10*3/uL (ref 150–400)
RBC: 3.75 MIL/uL — ABNORMAL LOW (ref 3.87–5.11)
RDW: 14.3 % (ref 11.5–15.5)
WBC: 8.8 10*3/uL (ref 4.0–10.5)
nRBC: 0 % (ref 0.0–0.2)

## 2023-10-20 LAB — BASIC METABOLIC PANEL
Anion gap: 9 (ref 5–15)
BUN: 25 mg/dL — ABNORMAL HIGH (ref 8–23)
CO2: 24 mmol/L (ref 22–32)
Calcium: 8.4 mg/dL — ABNORMAL LOW (ref 8.9–10.3)
Chloride: 105 mmol/L (ref 98–111)
Creatinine, Ser: 0.93 mg/dL (ref 0.44–1.00)
GFR, Estimated: >60 mL/min (ref >60)
Glucose, Bld: 135 mg/dL — ABNORMAL HIGH (ref 70–99)
Potassium: 3.5 mmol/L (ref 3.5–5.1)
Sodium: 138 mmol/L (ref 135–145)

## 2023-10-20 LAB — BRAIN NATRIURETIC PEPTIDE: B Natriuretic Peptide: 833.4 pg/mL — ABNORMAL HIGH (ref 0.0–100.0)

## 2023-10-20 LAB — PROCALCITONIN: Procalcitonin: <0.1 ng/mL

## 2023-10-20 LAB — MAGNESIUM: Magnesium: 2 mg/dL (ref 1.7–2.4)

## 2023-10-20 MED ORDER — DIGOXIN 0.25 MG/ML IJ SOLN
0.5000 mg | Freq: Once | INTRAMUSCULAR | Status: AC
  Administered 2023-10-20: 0.5 mg via INTRAVENOUS
  Filled 2023-10-20: qty 2

## 2023-10-20 MED ORDER — PREDNISONE 20 MG PO TABS
30.0000 mg | ORAL_TABLET | Freq: Every day | ORAL | Status: DC
  Administered 2023-10-21: 30 mg via ORAL
  Filled 2023-10-20: qty 1

## 2023-10-20 MED ORDER — PREDNISONE 10 MG PO TABS: 10.0000 mg | ORAL_TABLET | Freq: Every day | ORAL | Status: DC

## 2023-10-20 MED ORDER — DIGOXIN 0.25 MG/ML IJ SOLN
0.2500 mg | Freq: Four times a day (QID) | INTRAMUSCULAR | Status: AC
  Administered 2023-10-20 – 2023-10-21 (×2): 0.25 mg via INTRAVENOUS
  Filled 2023-10-20 (×2): qty 2

## 2023-10-20 MED ORDER — POTASSIUM CHLORIDE CRYS ER 20 MEQ PO TBCR
60.0000 meq | EXTENDED_RELEASE_TABLET | Freq: Once | ORAL | Status: AC
  Administered 2023-10-20: 60 meq via ORAL
  Filled 2023-10-20: qty 3

## 2023-10-20 MED ORDER — PREDNISONE 20 MG PO TABS: 20.0000 mg | ORAL_TABLET | Freq: Every day | ORAL | Status: DC

## 2023-10-20 MED ORDER — MELATONIN 3 MG PO TABS
3.0000 mg | ORAL_TABLET | Freq: Once | ORAL | Status: AC
  Administered 2023-10-20: 3 mg via ORAL
  Filled 2023-10-20: qty 1

## 2023-10-20 NOTE — Progress Notes (Signed)
Mobility Specialist Progress Note:    10/20/23 1150  Mobility  Activity Ambulated with assistance in hallway  Level of Assistance Standby assist, set-up cues, supervision of patient - no hands on  Assistive Device Other (Comment) (IV Pole)  Distance Ambulated (ft) 500 ft  Activity Response Tolerated well  Mobility Referral Yes  Mobility visit 1 Mobility  Mobility Specialist Start Time (ACUTE ONLY) 1000  Mobility Specialist Stop Time (ACUTE ONLY) 1015  Mobility Specialist Time Calculation (min) (ACUTE ONLY) 15 min   Pt received in bed agreeable to mobility no physical assistance needed throughout session. Ambulated on 3L/min to keep SPO2 above 88%. No c/o throughout. Stated their hip was feeling much better. Returned to room w/o fault. Seated in chair while NT makes bed. All needs met. NT in room.  Thompson Grayer Mobility Specialist  Please contact vis Secure Chat or  Rehab Office [phone]

## 2023-10-20 NOTE — Progress Notes (Signed)
Rounding Note    Patient Name: Erin Li Date of Encounter: 10/20/2023  [facility] Cardiologist: Yates Decamp, MD   Subjective   SOB improving but not resolved  Inpatient Medications    Scheduled Meds:  apixaban  5 mg Oral BID   arformoterol  15 mcg Nebulization BID   atorvastatin  10 mg Oral Daily   bisoprolol  5 mg Oral Daily   budesonide (PULMICORT) nebulizer solution  0.25 mg Nebulization BID   fluticasone  1 spray Each Nare Daily   furosemide  40 mg Intravenous Daily   guaiFENesin  600 mg Oral BID   levalbuterol  0.63 mg Nebulization BID   And   ipratropium  0.5 mg Nebulization BID   pantoprazole  40 mg Oral Q1200   [START ON 10/21/2023] predniSONE  30 mg Oral Q breakfast   Followed by   [name] ON 10/24/2023] predniSONE  20 mg Oral Q breakfast   Followed by   [name] ON 10/27/2023] predniSONE  10 mg Oral Q breakfast   revefenacin  175 mcg Nebulization Daily   sodium chloride flush  3 mL Intravenous Q12H   Continuous Infusions:  amiodarone 30 mg/hr (10/20/23 0915)   PRN Meds: acetaminophen **OR** acetaminophen, ondansetron **OR** ondansetron (ZOFRAN) IV, polyethylene glycol, sodium chloride flush   Vital Signs    Vitals:   10/20/23 0718 10/20/23 0738 10/20/23 0900 10/20/23 1221  BP: 113/76   (!) 104/50  Pulse: 98   (!) 131  Resp: 20   18  Temp: 97.6 F (36.4 C)   98.3 F (36.8 C)  TempSrc: Oral   Oral  SpO2: 93% 98% 96% 93%  Weight:      Height:        Intake/Output Summary (Last 24 hours) at 10/20/2023 1244 Last data filed at 10/20/2023 1024 Gross per 24 hour  Intake 633 ml  Output 1350 ml  Net -717 ml      10/20/2023    4:04 AM 10/19/2023    4:18 AM 10/18/2023    5:16 AM  Last 3 Weights  Weight (lbs) 144 lb 10 oz 147 lb 8 oz 147 lb 3.2 oz  Weight (kg) 65.6 kg 66.906 kg 66.769 kg      Telemetry    Afib with RVR - Personally Reviewed  ECG    N/a - Personally Reviewed  Physical Exam   GEN: No acute distress.   Neck:  No JVD Cardiac: irreg, tachy  Respiratory: crackles bilateral bases GI: Soft, nontender, non-distended  MS: No edema; No deformity. Neuro:  Nonfocal  Psych: Normal affect   Labs    High Sensitivity Troponin:   Recent Labs  Lab 10/17/23 0805 10/17/23 1114  TROPONINIHS 13 12     Chemistry Recent Labs  Lab 10/18/23 0247 10/19/23 0242 10/20/23 0225  NA 139 139 138  K 3.8 3.9 3.5  CL 107 107 105  CO2 21* 23 24  GLUCOSE 154* 124* 135*  BUN 18 20 25*  CREATININE 0.92 0.84 0.93  CALCIUM 8.7* 8.7* 8.4*  MG  --  2.0 2.0  GFRNONAA >60 >60 >60  ANIONGAP 11 9 9     Lipids  Recent Labs  Lab 10/19/23 0242  CHOL 128  TRIG 56  HDL 39*  LDLCALC 78  CHOLHDL 3.3    Hematology Recent Labs  Lab 10/17/23 0805 10/19/23 0242 10/20/23 0225  WBC 5.9 10.3 8.8  RBC 4.02 3.36* 3.75*  HGB 13.6 11.5* 12.4  HCT 39.7 33.5*  37.0  MCV 98.8 99.7 98.7  MCH 33.8 34.2* 33.1  MCHC 34.3 34.3 33.5  RDW 14.2 14.5 14.3  PLT 264 230 236   Thyroid  Recent Labs  Lab 10/18/23 0247  TSH 0.738    BNP Recent Labs  Lab 10/17/23 0810 10/20/23 0225  BNP 206.7* 833.4*    DDimer No results for input(s): "DDIMER" in the last 168 hours.   Radiology    ECHOCARDIOGRAM COMPLETE Result Date: 10/18/2023    ECHOCARDIOGRAM REPORT   Patient Name:   [name] Date of Exam: 10/18/2023 Medical Rec #:  [id number]          Height:       65.0 in Accession #:    [id number]         Weight:       147.2 lb Date of Birth:  1941-07-22         BSA:          1.737 m Patient Age:    82 years           BP:           105/57 mmHg Patient Gender: F                  HR:           85 bpm. Exam Location:  Inpatient Procedure: 2D Echo, Cardiac Doppler and Color Doppler (Both Spectral and Color            Flow Doppler were utilized during procedure). Indications:    atrial fibrillation  History:        Patient has prior history of Echocardiogram examinations, most                 recent 06/07/2017. COPD, Arrythmias:Atrial  Fibrillation; Risk                 Factors:Former Smoker, Hypertension and Dyslipidemia.  Sonographer:    Delcie Roch RDCS Referring Phys: Sharla Kidney Dewayne Shorter M GHIMIRE IMPRESSIONS  1. Left ventricular ejection fraction, by estimation, is 40 to 45%. The left ventricle has mildly decreased function. The left ventricle demonstrates global hypokinesis. Left ventricular diastolic parameters are consistent with Grade II diastolic dysfunction (pseudonormalization).  2. Right ventricular systolic function is mildly reduced. The right ventricular size is mildly enlarged. There is moderately elevated pulmonary artery systolic pressure. The estimated right ventricular systolic pressure is 59.3 mmHg.  3. Left atrial size was moderately dilated.  4. Right atrial size was moderately dilated.  5. The mitral valve is normal in structure. 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 No evidence of mitral stenosis. Moderate mitral annular calcification.  6. Tricuspid valve regurgitation is moderate.  7. The aortic valve is tricuspid. There is mild calcification of the aortic valve. Aortic valve regurgitation is not visualized. No aortic stenosis is present.  8. The inferior vena cava is normal in size with <50% respiratory variability, suggesting right atrial pressure of 8 mmHg. FINDINGS  Left Ventricle: Left ventricular ejection fraction, by estimation, is 40 to 45%. The left ventricle has mildly decreased function. The left ventricle demonstrates global hypokinesis. Strain imaging was not performed. The left ventricular internal cavity  size was normal in size. There is no left ventricular hypertrophy. Left ventricular diastolic parameters are consistent with Grade II diastolic dysfunction (pseudonormalization). Right Ventricle: The right ventricular size is mildly enlarged. No increase in right ventricular wall thickness. Right ventricular systolic function is mildly reduced. There is moderately  elevated  pulmonary artery systolic pressure. The tricuspid regurgitant velocity is 3.58 m/s, and with an assumed right atrial pressure of 8 mmHg, the estimated right ventricular systolic pressure is 59.3 mmHg. Left Atrium: Left atrial size was moderately dilated. Right Atrium: Right atrial size was moderately dilated. Pericardium: Trivial pericardial effusion is present. Mitral Valve: The mitral valve is normal in structure. Moderate mitral annular calcification. Moderate mitral valve regurgitation. No evidence of mitral valve stenosis. Tricuspid Valve: The tricuspid valve is normal in structure. Tricuspid valve regurgitation is moderate. Aortic Valve: The aortic valve is tricuspid. There is mild calcification of the aortic valve. Aortic valve regurgitation is not visualized. No aortic stenosis is present. Pulmonic Valve: The pulmonic valve was normal in structure. Pulmonic valve regurgitation is trivial. Aorta: The aortic root is normal in size and structure. Venous: The inferior vena cava is normal in size with less than 50% respiratory variability, suggesting right atrial pressure of 8 mmHg. IAS/Shunts: No atrial level shunt detected by color flow Doppler. Additional Comments: 3D imaging was not performed.  LEFT VENTRICLE PLAX 2D LVIDd:         4.70 cm LVIDs:         3.70 cm LV PW:         1.10 cm LV IVS:        1.00 cm LVOT diam:     1.80 cm LV SV:         41 LV SV Index:   24 LVOT Area:     2.54 cm  RIGHT VENTRICLE          IVC RV Basal diam:  2.50 cm  IVC diam: 1.90 cm LEFT ATRIUM             Index        RIGHT ATRIUM           Index LA diam:        3.90 cm 2.25 cm/m   RA Area:     16.40 cm LA Vol (A2C):   55.9 ml 32.19 ml/m  RA Volume:   43.10 ml  24.82 ml/m LA Vol (A4C):   52.4 ml 30.18 ml/m LA Biplane Vol: 57.9 ml 33.34 ml/m  AORTIC VALVE LVOT Vmax:   82.90 cm/s LVOT Vmean:  50.900 cm/s LVOT VTI:    0.162 m  AORTA Ao Root diam: 2.70 cm Ao Asc diam:  3.20 cm TRICUSPID VALVE TR Peak grad:   51.3 mmHg TR Vmax:         358.00 cm/s  SHUNTS Systemic VTI:  0.16 m Systemic Diam: 1.80 cm Dalton McleanMD Electronically signed by Wilfred Lacy Signature Date/Time: 10/18/2023/2:34:54 PM    Final     Cardiac Studies     Patient Profile     ILEIGH METTLER is a 83 y.o. 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 Lynne Logan.   Assessment & Plan    1.Afib - new onset this admission - has been paroxysmal this admission - on bisoprolol for rate control. Dosing limited due to soft bp's.  - appears started on amio yesterday for recurrent afib yesterday. History of COPD and pulmonary fibrosis though from notes mild, would look to avoid amio.  - d/c amio, with low LVEF digoxin reasonable option to add to bisoprolol. If fails rate control would consult  EP for antiarrhythmic options. Load IV digoxin 0.5mg  x 1 then 0.25mg  every 6 hrs IV x 2 additional doses.  - started on eliquis  2. Acute HFmrEF - 10/2023 echo: LVEF 40-45%, global hypokinesis. Grade II dd. Mild RV dysfunction. Mod pulm HTN, mod BAE, mod MR -CXR chronic changes with suggestion of pulm edema. BNP 833 - neg 2.5 L, neg 4.1 L since admission. She is on IV lasix 40mg  daily. Renal function is stable. Remains fluid overloaded, continue IV lasix  - medical therapy with bisoprolol 5mg . SOft bp's at times, holding other meds.  - drop in LVEF may be tachy mediated. With just mild dysfunction would treat medically and recheck echo next few months    3.CAD - history of prior angioplasty remotely 40 years ago   4. ILD/COPD - followed by pulmonary as outpatient.   5. Mitral regurgitation - moderate by echo - monitor with management of her systolic dysfunction    For questions or updates, please contact [facility] Please consult [email or web address] for contact info under         Signed, Dina Rich, MD  10/20/2023, 12:44 PM

## 2023-10-20 NOTE — Progress Notes (Signed)
   10/20/23 1927  Vitals  Temp (!) 97.2 F (36.2 C)  Temp Source Axillary  BP 100/61  MAP (mmHg) 70  BP Location Left Arm  BP Method Automatic  Patient Position (if appropriate) Lying  Pulse Rate 100  Pulse Rate Source Monitor  ECG Heart Rate (!) 125  Resp 18  Level of Consciousness  Level of Consciousness Alert  MEWS COLOR  MEWS Score Color Yellow  Oxygen Therapy  SpO2 100 %  O2 Device Room Air  Pain Assessment  Pain Scale 0-10  Pain Score 0  MEWS Score  MEWS Temp 0  MEWS Systolic 1  MEWS Pulse 2  MEWS RR 0  MEWS LOC 0  MEWS Score 3

## 2023-10-20 NOTE — Progress Notes (Signed)
PROGRESS NOTE  Erin Li:[id number] DOB: 07-22-1941   PCP: [name], DO  Patient is from: Home.  DOA: 10/17/2023 LOS: 3  Chief complaints Chief Complaint  Patient presents with   Shortness of Breath     Brief Narrative / Interim history: 83 year old F with PMH of COPD, CAD, HTN and HLD presented to [facility] with SOB, productive cough, DOE and edema, and admitted with working diagnosis of A-fib with RVR with new onset CHF exacerbation and possible COPD exacerbation.  She was started on steroid, bronchodilators, Cardizem drip and IV heparin, and admitted for further care.    Patient was evaluated by cardiology.  TTE with LVEF of 40 to 45%, G2 DD, RVSP of 59 mmHg and moderate MR.  TSH within normal.  Patient was transitioned to p.o. beta-blocker but went back into A-fib with RVR.  Eventually, started on IV amiodarone.  Subjective: Seen and examined earlier this morning.  No major events overnight of this morning.  Remains in A-fib with RVR with HR ranging from 80s to 120s.  Continues to endorse productive cough with yellowish phlegm.  She denies chest pain.  Reports improvement in her breathing.  Objective: Vitals:   10/20/23 0412 10/20/23 0718 10/20/23 0738 10/20/23 0900  BP: 130/70 113/76    Pulse: 77 98    Resp: 18 20    Temp: 98.8 F (37.1 C) 97.6 F (36.4 C)    TempSrc: Oral Oral    SpO2: 93% 93% 98% 96%  Weight:      Height:        Examination:  GENERAL: No apparent distress.  Nontoxic. HEENT: MMM.  Vision and hearing grossly intact.  NECK: Supple.  No apparent JVD.  RESP:  No IWOB.  Fair aeration bilaterally.  Rhonchi/crackles bilateral. CVS: Irregular rhythm.  HR ranges from 80s to 120s.  Heart sounds normal.  ABD/GI/GU: BS+. Abd soft, NTND.  MSK/EXT:  Moves extremities. No apparent deformity. No edema.  SKIN: no apparent skin lesion or wound NEURO: Awake, alert and oriented appropriately.  No apparent focal neuro deficit. PSYCH: Calm.  Normal affect.   Procedures:  None  Microbiology summarized: COVID-19, influenza and RSV PCR nonreactive  Assessment and plan: New onset A-fib with RVR: Initially treated with IV Cardizem and transition to p.o. beta-blocker.  She went back into A-fib with RVR again.  TTE with LVEF of 40 to 45%, GH, G2 DD, RVSP of 59 and moderate MR.  TSH within normal.  CHA2DS2-VASc score > 5 -Cardiology on board-on amiodarone drip and p.o. 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  Presented with dyspnea, DOE and edema.  Initial BNP about 200.  CXR redemonstrated chronic ILD with increased interstitial marking, new bilateral trace pleural effusion concerning for superimposed CHF/pulmonary edema.  Appears euvolemic on exam. -Cardiology on board -Continue IV Lasix 40 mg daily -Recheck BNP and CXR. -Strict intake and output, daily weight, renal functions and electrolytes.  Possible COPD exacerbation Interstitial lung disease?  Followed by Dr. Marchelle Gearing outpatient. -CRP negative.  COVID-19, influenza and RSV PCR nonreactive. -Repeat CXR, BMP and procal -Resume prednisone.  Will taper slowly. -Continue scheduled and as needed nebulizers. -Continue PPI  Acute respiratory failure with hypoxia: Multifactorial including A-fib, COPD and CHF. -Management as above -Incentive spirometry -Wean off oxygen as able.   Essential hypertension: Normotensive. -Continue bisoprolol   Hyperlipidemia -Continue Lipitor   GERD -Continue PPI     Body mass index  is 24.07 kg/m.           DVT prophylaxis:   apixaban (ELIQUIS) tablet 5 mg  Code Status: Full code Family Communication: None at bedside Level of care: Progressive Status is: Inpatient Remains inpatient appropriate because: A-fib with RVR   Final disposition: Home Consultants:  Cardiology  55 minutes with more than 50% spent in reviewing records,  counseling patient/family and coordinating care.   Sch Meds:  Scheduled Meds:  apixaban  5 mg Oral BID   arformoterol  15 mcg Nebulization BID   atorvastatin  10 mg Oral Daily   bisoprolol  5 mg Oral Daily   budesonide (PULMICORT) nebulizer solution  0.25 mg Nebulization BID   fluticasone  1 spray Each Nare Daily   furosemide  40 mg Intravenous Daily   guaiFENesin  600 mg Oral BID   levalbuterol  0.63 mg Nebulization BID   And   ipratropium  0.5 mg Nebulization BID   pantoprazole  40 mg Oral Q1200   [START ON 10/21/2023] predniSONE  30 mg Oral Q breakfast   Followed by   [name] ON 10/24/2023] predniSONE  20 mg Oral Q breakfast   Followed by   [name] ON 10/27/2023] predniSONE  10 mg Oral Q breakfast   revefenacin  175 mcg Nebulization Daily   sodium chloride flush  3 mL Intravenous Q12H   Continuous Infusions:  amiodarone 30 mg/hr (10/20/23 0915)   PRN Meds:.acetaminophen **OR** acetaminophen, ondansetron **OR** ondansetron (ZOFRAN) IV, polyethylene glycol, sodium chloride flush  Antimicrobials: Anti-infectives (From admission, onward)    None        I have personally reviewed the following labs and images: CBC: Recent Labs  Lab 10/17/23 0805 10/19/23 0242 10/20/23 0225  WBC 5.9 10.3 8.8  NEUTROABS 3.6  --   --   HGB 13.6 11.5* 12.4  HCT 39.7 33.5* 37.0  MCV 98.8 99.7 98.7  PLT 264 230 236   BMP &GFR Recent Labs  Lab 10/17/23 0801 10/18/23 0247 10/19/23 0242 10/20/23 0225  NA 141 139 139 138  K 4.0 3.8 3.9 3.5  CL 108 107 107 105  CO2 23 21* 23 24  GLUCOSE 111* 154* 124* 135*  BUN 19 18 20  25*  CREATININE 0.81 0.92 0.84 0.93  CALCIUM 9.3 8.7* 8.7* 8.4*  MG  --   --  2.0 2.0   Estimated Creatinine Clearance: 42.7 mL/min (by C-G formula based on SCr of 0.93 mg/dL). Liver & Pancreas: No results for input(s): "AST", "ALT", "ALKPHOS", "BILITOT", "PROT", "ALBUMIN" in the last 168 hours. No results for input(s): "LIPASE", "AMYLASE" in the last 168  hours. No results for input(s): "AMMONIA" in the last 168 hours. Diabetic: No results for input(s): "HGBA1C" in the last 72 hours. No results for input(s): "GLUCAP" in the last 168 hours. Cardiac Enzymes: No results for input(s): "CKTOTAL", "CKMB", "CKMBINDEX", "TROPONINI" in the last 168 hours. No results for input(s): "PROBNP" in the last 8760 hours. Coagulation Profile: No results for input(s): "INR", "PROTIME" in the last 168 hours. Thyroid Function Tests: Recent Labs    10/18/23 0247  TSH 0.738   Lipid Profile: Recent Labs    10/19/23 0242  CHOL 128  HDL 39*  LDLCALC 78  TRIG 56  CHOLHDL 3.3   Anemia Panel: No results for input(s): "VITAMINB12", "FOLATE", "FERRITIN", "TIBC", "IRON", "RETICCTPCT" in the last 72 hours. Urine analysis:    Component Value Date/Time   COLORURINE YELLOW 08/12/2016 1253   APPEARANCEUR CLEAR 08/12/2016 1253  LABSPEC 1.016 08/12/2016 1253   PHURINE 5.0 08/12/2016 1253   GLUCOSEU NEGATIVE 08/12/2016 1253   HGBUR NEGATIVE 08/12/2016 1253   BILIRUBINUR NEGATIVE 08/12/2016 1253   KETONESUR NEGATIVE 08/12/2016 1253   PROTEINUR NEGATIVE 08/12/2016 1253   NITRITE NEGATIVE 08/12/2016 1253   LEUKOCYTESUR NEGATIVE 08/12/2016 1253   Sepsis Labs: Invalid input(s): "PROCALCITONIN", "LACTICIDVEN"  Microbiology: Recent Results (from the past 240 hours)  Resp panel by RT-PCR (RSV, Flu A&B, Covid) Anterior Nasal Swab     Status: None   Collection Time: 10/17/23  8:01 AM   Specimen: Anterior Nasal Swab  Result Value Ref Range Status   SARS Coronavirus 2 by RT PCR NEGATIVE NEGATIVE Final    Comment: (NOTE) SARS-CoV-2 target nucleic acids are NOT DETECTED.  The SARS-CoV-2 RNA is generally detectable in upper respiratory specimens during the acute phase of infection. The lowest concentration of SARS-CoV-2 viral copies this assay can detect is 138 copies/mL. A negative result does not preclude SARS-Cov-2 infection and should not be used as the sole  basis for treatment or other patient management decisions. A negative result may occur with  improper specimen collection/handling, submission of specimen other than nasopharyngeal swab, presence of viral mutation(s) within the areas targeted by this assay, and inadequate number of viral copies(<138 copies/mL). A negative result must be combined with clinical observations, patient history, and epidemiological information. The expected result is Negative.  Fact Sheet for Patients:  BloggerCourse.com  Fact Sheet for Healthcare Providers:  SeriousBroker.it  This test is no t yet approved or cleared by the [place] FDA and  has been authorized for detection and/or diagnosis of SARS-CoV-2 by FDA under an Emergency Use Authorization (EUA). This EUA will remain  in effect (meaning this test can be used) for the duration of the COVID-19 declaration under Section 564(b)(1) of the Act, 21 U.S.C.section 360bbb-3(b)(1), unless the authorization is terminated  or revoked sooner.       Influenza A by PCR NEGATIVE NEGATIVE Final   Influenza B by PCR NEGATIVE NEGATIVE Final    Comment: (NOTE) The Xpert Xpress SARS-CoV-2/FLU/RSV plus assay is intended as an aid in the diagnosis of influenza from Nasopharyngeal swab specimens and should not be used as a sole basis for treatment. Nasal washings and aspirates are unacceptable for Xpert Xpress SARS-CoV-2/FLU/RSV testing.  Fact Sheet for Patients: BloggerCourse.com  Fact Sheet for Healthcare Providers: SeriousBroker.it  This test is not yet approved or cleared by the [place] FDA and has been authorized for detection and/or diagnosis of SARS-CoV-2 by FDA under an Emergency Use Authorization (EUA). This EUA will remain in effect (meaning this test can be used) for the duration of the COVID-19 declaration under Section 564(b)(1) of the Act,  21 U.S.C. section 360bbb-3(b)(1), unless the authorization is terminated or revoked.     Resp Syncytial Virus by PCR NEGATIVE NEGATIVE Final    Comment: (NOTE) Fact Sheet for Patients: BloggerCourse.com  Fact Sheet for Healthcare Providers: SeriousBroker.it  This test is not yet approved or cleared by the [place] FDA and has been authorized for detection and/or diagnosis of SARS-CoV-2 by FDA under an Emergency Use Authorization (EUA). This EUA will remain in effect (meaning this test can be used) for the duration of the COVID-19 declaration under Section 564(b)(1) of the Act, 21 U.S.C. section 360bbb-3(b)(1), unless the authorization is terminated or revoked.  Performed at [facility], [address] [place], [place] [address]     Radiology Studies: No results found.  Yukie Bergeron T. Liandra Mendia [facility]  If 7PM-7AM, please contact night-coverage [email or web address] 10/20/2023, 12:03 PM

## 2023-10-20 NOTE — Plan of Care (Signed)

## 2023-10-20 NOTE — Plan of Care (Signed)

## 2023-10-21 DIAGNOSIS — I5043 Acute on chronic combined systolic (congestive) and diastolic (congestive) heart failure: Secondary | ICD-10-CM | POA: Diagnosis not present

## 2023-10-21 DIAGNOSIS — I5021 Acute systolic (congestive) heart failure: Secondary | ICD-10-CM | POA: Diagnosis not present

## 2023-10-21 DIAGNOSIS — I4891 Unspecified atrial fibrillation: Secondary | ICD-10-CM | POA: Diagnosis not present

## 2023-10-21 DIAGNOSIS — J441 Chronic obstructive pulmonary disease with (acute) exacerbation: Secondary | ICD-10-CM | POA: Diagnosis not present

## 2023-10-21 LAB — RENAL FUNCTION PANEL
Albumin: 2.8 g/dL — ABNORMAL LOW (ref 3.5–5.0)
Anion gap: 13 (ref 5–15)
BUN: 22 mg/dL (ref 8–23)
CO2: 21 mmol/L — ABNORMAL LOW (ref 22–32)
Calcium: 9 mg/dL (ref 8.9–10.3)
Chloride: 106 mmol/L (ref 98–111)
Creatinine, Ser: 0.82 mg/dL (ref 0.44–1.00)
GFR, Estimated: >60 mL/min (ref >60)
Glucose, Bld: 118 mg/dL — ABNORMAL HIGH (ref 70–99)
Phosphorus: 3.1 mg/dL (ref 2.5–4.6)
Potassium: 4.1 mmol/L (ref 3.5–5.1)
Sodium: 140 mmol/L (ref 135–145)

## 2023-10-21 LAB — CBC
HCT: 38.7 % (ref 36.0–46.0)
Hemoglobin: 13 g/dL (ref 12.0–15.0)
MCH: 33.1 pg (ref 26.0–34.0)
MCHC: 33.6 g/dL (ref 30.0–36.0)
MCV: 98.5 fL (ref 80.0–100.0)
Platelets: 259 10*3/uL (ref 150–400)
RBC: 3.93 MIL/uL (ref 3.87–5.11)
RDW: 14 % (ref 11.5–15.5)
WBC: 8.2 10*3/uL (ref 4.0–10.5)
nRBC: 0 % (ref 0.0–0.2)

## 2023-10-21 LAB — MAGNESIUM: Magnesium: 1.9 mg/dL (ref 1.7–2.4)

## 2023-10-21 MED ORDER — MELATONIN 5 MG PO TABS
5.0000 mg | ORAL_TABLET | Freq: Every evening | ORAL | Status: DC | PRN
Start: 2023-10-21 — End: 2023-10-23
  Administered 2023-10-21 – 2023-10-22 (×2): 5 mg via ORAL
  Filled 2023-10-21 (×2): qty 1

## 2023-10-21 MED ORDER — PREDNISONE 20 MG PO TABS
30.0000 mg | ORAL_TABLET | Freq: Every day | ORAL | Status: AC
  Administered 2023-10-22: 30 mg via ORAL
  Filled 2023-10-21: qty 1

## 2023-10-21 MED ORDER — BISOPROLOL FUMARATE 5 MG PO TABS
7.5000 mg | ORAL_TABLET | Freq: Every day | ORAL | Status: DC
  Administered 2023-10-22 – 2023-10-23 (×2): 7.5 mg via ORAL
  Filled 2023-10-21 (×2): qty 2

## 2023-10-21 MED ORDER — PREDNISONE 10 MG PO TABS
10.0000 mg | ORAL_TABLET | Freq: Every day | ORAL | Status: DC
  Filled 2023-10-21: qty 1

## 2023-10-21 MED ORDER — FUROSEMIDE 10 MG/ML IJ SOLN
20.0000 mg | Freq: Once | INTRAMUSCULAR | Status: AC
  Administered 2023-10-21: 20 mg via INTRAVENOUS
  Filled 2023-10-21: qty 2

## 2023-10-21 MED ORDER — PREDNISONE 20 MG PO TABS
20.0000 mg | ORAL_TABLET | Freq: Every day | ORAL | Status: AC
  Administered 2023-10-23: 20 mg via ORAL
  Filled 2023-10-21 (×2): qty 1

## 2023-10-21 MED ORDER — BISOPROLOL FUMARATE 5 MG PO TABS
2.5000 mg | ORAL_TABLET | Freq: Once | ORAL | Status: AC
  Administered 2023-10-21: 2.5 mg via ORAL
  Filled 2023-10-21: qty 1

## 2023-10-21 MED ORDER — DIGOXIN 125 MCG PO TABS
0.1250 mg | ORAL_TABLET | Freq: Every day | ORAL | Status: DC
  Administered 2023-10-21: 0.125 mg via ORAL
  Filled 2023-10-21: qty 1

## 2023-10-21 NOTE — Progress Notes (Signed)
Patient verbalized that her palms are red which is an indication of a possible  allergy to new medication. Patient did received digoxin today, which she said this is something she never taken before. Cardiology paged. VSS. She denied any SOB, chest tightness, or itching. VSS. Card team aware. Digoxin added to her allergies list. Will continue to monitor.

## 2023-10-21 NOTE — Progress Notes (Signed)
Rounding Note    Patient Name: Erin Li Date of Encounter: 10/21/2023  [facility] Cardiologist: Yates Decamp, MD   Subjective   Some ongoign SOB  Inpatient Medications    Scheduled Meds:  apixaban  5 mg Oral BID   arformoterol  15 mcg Nebulization BID   atorvastatin  10 mg Oral Daily   bisoprolol  5 mg Oral Daily   budesonide (PULMICORT) nebulizer solution  0.25 mg Nebulization BID   fluticasone  1 spray Each Nare Daily   furosemide  40 mg Intravenous Daily   guaiFENesin  600 mg Oral BID   levalbuterol  0.63 mg Nebulization BID   And   ipratropium  0.5 mg Nebulization BID   pantoprazole  40 mg Oral Q1200   predniSONE  30 mg Oral Q breakfast   Followed by   [name] ON 10/24/2023] predniSONE  20 mg Oral Q breakfast   Followed by   [name] ON 10/27/2023] predniSONE  10 mg Oral Q breakfast   revefenacin  175 mcg Nebulization Daily   sodium chloride flush  3 mL Intravenous Q12H   Continuous Infusions:  PRN Meds: acetaminophen **OR** acetaminophen, ondansetron **OR** ondansetron (ZOFRAN) IV, polyethylene glycol, sodium chloride flush   Vital Signs    Vitals:   10/21/23 0055 10/21/23 0420 10/21/23 0730 10/21/23 0750  BP: 110/84 125/85 (!) 137/96   Pulse: 67 70 87   Resp: 18 18 18    Temp: 97.8 F (36.6 C) 97.7 F (36.5 C) 97.8 F (36.6 C)   TempSrc: Oral Oral Oral   SpO2: 92% 94% 93% 99%  Weight:  65 kg    Height:        Intake/Output Summary (Last 24 hours) at 10/21/2023 1042 Last data filed at 10/21/2023 0851 Gross per 24 hour  Intake 1519.55 ml  Output 1700 ml  Net -180.45 ml      10/21/2023    4:20 AM 10/20/2023    4:04 AM 10/19/2023    4:18 AM  Last 3 Weights  Weight (lbs) 143 lb 4.8 oz 144 lb 10 oz 147 lb 8 oz  Weight (kg) 65 kg 65.6 kg 66.906 kg      Telemetry    Afib variable rates - Personally Reviewed  ECG    N/a - Personally Reviewed  Physical Exam   GEN: No acute distress.   Neck: No JVD Cardiac: irregular,  tachy Respiratory: mild crackles bilatearlly GI: Soft, nontender, non-distended  MS: No edema; No deformity. Neuro:  Nonfocal  Psych: Normal affect   Labs    High Sensitivity Troponin:   Recent Labs  Lab 10/17/23 0805 10/17/23 1114  TROPONINIHS 13 12     Chemistry Recent Labs  Lab 10/19/23 0242 10/20/23 0225 10/21/23 0221  NA 139 138 140  K 3.9 3.5 4.1  CL 107 105 106  CO2 23 24 21*  GLUCOSE 124* 135* 118*  BUN 20 25* 22  CREATININE 0.84 0.93 0.82  CALCIUM 8.7* 8.4* 9.0  MG 2.0 2.0 1.9  ALBUMIN  --   --  2.8*  GFRNONAA >60 >60 >60  ANIONGAP 9 9 13     Lipids  Recent Labs  Lab 10/19/23 0242  CHOL 128  TRIG 56  HDL 39*  LDLCALC 78  CHOLHDL 3.3    Hematology Recent Labs  Lab 10/19/23 0242 10/20/23 0225 10/21/23 0221  WBC 10.3 8.8 8.2  RBC 3.36* 3.75* 3.93  HGB 11.5* 12.4 13.0  HCT 33.5* 37.0 38.7  MCV  99.7 98.7 98.5  MCH 34.2* 33.1 33.1  MCHC 34.3 33.5 33.6  RDW 14.5 14.3 14.0  PLT 230 236 259   Thyroid  Recent Labs  Lab 10/18/23 0247  TSH 0.738    BNP Recent Labs  Lab 10/17/23 0810 10/20/23 0225  BNP 206.7* 833.4*    DDimer No results for input(s): "DDIMER" in the last 168 hours.   Radiology    DG Chest Port 1 View Result Date: 10/20/2023 CLINICAL DATA:  Shortness of breath. EXAM: PORTABLE CHEST 1 VIEW COMPARISON:  10/17/2023 FINDINGS: Stable cardiac enlargement. Stable chronic lung disease. Less prominent bilateral pleural effusions with potential trace pleural fluid remaining bilaterally. No overt pulmonary edema, focal airspace consolidation or pneumothorax. IMPRESSION: Less prominent bilateral pleural effusions with potential trace pleural fluid remaining bilaterally. Stable cardiac enlargement and chronic lung disease. Electronically Signed   By: Irish Lack M.D.   On: 10/20/2023 13:18    Cardiac Studies     Patient Profile     Erin Li is a 83 y.o. 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 Lynne Logan.   Assessment & Plan    1.Afib - new onset this admission - has been paroxysmal this admission - on bisoprolol for rate control in setting of lung disease. Dosing limited due to soft bp's.  - appears started on amio for recurrent afib History of COPD and pulmonary fibrosis though from notes mild, would look to avoid amio.  -  amio disocntinued, with low LVEF digoxin reasonable option to add to bisoprolol. If fails rate control would consult EP for antiarrhythmic options. - Loaded IV digoxin 0.5mg  x 1 then 0.25mg  every 6 hrs IV x 2 additional doses.  - started on eliquis  -rates low 100s most of day yesterday, early this AM elevated 130s to 140s.  - bps up, will try increase bisoprolol to 7.5mg  daily and add digoxin 0.125mg  daily - if fails rate control would consult EP for antiarrhythmic options other than amio. Avoid class IC with HFmrEF and coronary calcifications on CT. 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 Grade II dd. Mild RV dysfunction. Mod pulm HTN, mod BAE, mod MR -CXR chronic changes with suggestion of pulm edema. BNP 833 - neg , neg 4.5 L since admission. She is on IV lasix 40mg  daily. Renal function is stable. Remains fluid overloaded, continue IV lasix. Limited net diuresis yesterday, add additional 20mg  to the 40 she got this AM  -  She is on bisoprolol 7.5mg  daily. Soft bp's at times, holding off on other HF meds.  - drop in LVEF may be tachy mediated. With just mild dysfunction would treat medically and recheck echo next few months       3.CAD - history of prior angioplasty remotely 40 years ago     4. ILD/COPD - followed by pulmonary as outpatient.    5. Mitral regurgitation - moderate by echo - monitor with  management of her systolic dysfunction  For questions or updates, please contact [facility] Please consult [email or web address] for contact info under        Signed, Dina Rich, MD  10/21/2023, 10:42 AM

## 2023-10-21 NOTE — Progress Notes (Signed)
SATURATION QUALIFICATIONS: (This note is used to comply with regulatory documentation for home oxygen)  Patient Saturations on Room Air at Rest = 89-94%  Patient Saturations on Room Air while Ambulating = 86-89%  Patient Saturations on 2 Liters of oxygen while Ambulating = 93%

## 2023-10-21 NOTE — Plan of Care (Signed)

## 2023-10-21 NOTE — Progress Notes (Signed)
PROGRESS NOTE  Erin Li VQQ:[id number] DOB: 03/21/1941   PCP: [name], DO  Patient is from: Home.  DOA: 10/17/2023 LOS: 4  Chief complaints Chief Complaint  Patient presents with   Shortness of Breath     Brief Narrative / Interim history: 83 year old F with PMH of COPD, CAD, HTN and HLD presented to [facility] with SOB, productive cough, DOE and edema, and admitted with working diagnosis of A-fib with RVR with new onset CHF exacerbation and possible COPD exacerbation.  She was started on steroid, bronchodilators, Cardizem drip and IV heparin, and admitted for further care.    Patient was evaluated by cardiology.  TTE with LVEF of 40 to 45%, G2 DD, RVSP of 59 mmHg and moderate MR.  TSH within normal.  Patient was transitioned to p.o. beta-blocker but went back into A-fib with RVR.  Eventually, started on IV amiodarone which was discontinued due to underlying COPD/ILD.  Now on bisoprolol and digoxin.  Cardiology following..  Subjective: Seen and examined earlier this morning.  No major events overnight.  Heart rate remained in the range of 80s to 100s but that went up to 140s with ambulation in the hallway.  Objective: Vitals:   10/21/23 0055 10/21/23 0420 10/21/23 0730 10/21/23 0750  BP: 110/84 125/85 (!) 137/96   Pulse: 67 70 87   Resp: 18 18 18    Temp: 97.8 F (36.6 C) 97.7 F (36.5 C) 97.8 F (36.6 C)   TempSrc: Oral Oral Oral   SpO2: 92% 94% 93% 99%  Weight:  65 kg    Height:        Examination:  GENERAL: No apparent distress.  Nontoxic. HEENT: MMM.  Vision and hearing grossly intact.  NECK: Supple.  No apparent JVD.  RESP:  No IWOB.  Fair aeration bilaterally.  Mild rhonchi bilaterally. CVS: Irregular rhythm.  HR in 80s at rest.  Heart sounds normal.  ABD/GI/GU: BS+. Abd soft, NTND.  MSK/EXT:  Moves extremities. No apparent deformity. No edema.  SKIN: no apparent skin lesion or wound NEURO: Awake, alert and oriented appropriately.  No apparent  focal neuro deficit. PSYCH: Calm. Normal affect.   Procedures:  None  Microbiology summarized: COVID-19, influenza and RSV PCR nonreactive  Assessment and plan: New onset A-fib with RVR: Initially treated with IV Cardizem and transition to p.o. beta-blocker.  She went back into A-fib with RVR again.  TTE with LVEF of 40 to 45%, GH, G2 DD, RVSP of 59 and moderate MR.  TSH within normal.  CHA2DS2-VASc score > 5 -Cardiology on board-increase bisoprolol and scheduled digoxin. 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  Presented with dyspnea, DOE and edema.  Initial BNP about 200.  Initial CXR redemonstrated chronic ILD with increased interstitial marking, new bilateral trace pleural effusion concerning for superimposed CHF/pulmonary edema.  Repeat CXR with some improvement.  BNP trended up.  Appears euvolemic on exam. -Cardiology on board-on IV Lasix. -Continue IV Lasix 40 mg daily -Strict intake and output, daily weight, renal functions and electrolytes.  Possible COPD exacerbation/interstitial lung disease?  Followed by Dr. Marchelle Gearing outpatient. CRP negative.  COVID-19, influenza and RSV PCR nonreactive.  BNP trended up.  Some improvement on CXR. -Taper off steroid. -Continue scheduled and as needed nebulizers. -Continue PPI  Acute respiratory failure with hypoxia: Multifactorial including A-fib, COPD and CHF.  Desaturated to 86% with ambulation on room air requiring  2 L to recover. -Management as above -Incentive spirometry -Wean off oxygen as able.   Essential hypertension: Normotensive. -Continue bisoprolol per cardiology   Hyperlipidemia -Continue Lipitor   GERD -Continue PPI  Insomnia -Melatonin at night     Body mass index is 23.85 kg/m.           DVT prophylaxis:   apixaban (ELIQUIS) tablet 5 mg  Code Status: Full code Family  Communication: None at bedside Level of care: Progressive Status is: Inpatient Remains inpatient appropriate because: A-fib with RVR   Final disposition: Home Consultants:  Cardiology  55 minutes with more than 50% spent in reviewing records, counseling patient/family and coordinating care.   Sch Meds:  Scheduled Meds:  apixaban  5 mg Oral BID   arformoterol  15 mcg Nebulization BID   atorvastatin  10 mg Oral Daily   [START ON 10/22/2023] bisoprolol  7.5 mg Oral Daily   budesonide (PULMICORT) nebulizer solution  0.25 mg Nebulization BID   digoxin  0.125 mg Oral Daily   fluticasone  1 spray Each Nare Daily   furosemide  20 mg Intravenous Once   furosemide  40 mg Intravenous Daily   guaiFENesin  600 mg Oral BID   levalbuterol  0.63 mg Nebulization BID   And   ipratropium  0.5 mg Nebulization BID   pantoprazole  40 mg Oral Q1200   predniSONE  30 mg Oral Q breakfast   Followed by   [name] ON 10/24/2023] predniSONE  20 mg Oral Q breakfast   Followed by   [name] ON 10/27/2023] predniSONE  10 mg Oral Q breakfast   revefenacin  175 mcg Nebulization Daily   sodium chloride flush  3 mL Intravenous Q12H   Continuous Infusions:   PRN Meds:.acetaminophen **OR** acetaminophen, melatonin, ondansetron **OR** ondansetron (ZOFRAN) IV, polyethylene glycol, sodium chloride flush  Antimicrobials: Anti-infectives (From admission, onward)    None        I have personally reviewed the following labs and images: CBC: Recent Labs  Lab 10/17/23 0805 10/19/23 0242 10/20/23 0225 10/21/23 0221  WBC 5.9 10.3 8.8 8.2  NEUTROABS 3.6  --   --   --   HGB 13.6 11.5* 12.4 13.0  HCT 39.7 33.5* 37.0 38.7  MCV 98.8 99.7 98.7 98.5  PLT 264 230 236 259   BMP &GFR Recent Labs  Lab 10/17/23 0801 10/18/23 0247 10/19/23 0242 10/20/23 0225 10/21/23 0221  NA 141 139 139 138 140  K 4.0 3.8 3.9 3.5 4.1  CL 108 107 107 105 106  CO2 23 21* 23 24 21*  GLUCOSE 111* 154* 124* 135* 118*  BUN 19  18 20  25* 22  CREATININE 0.81 0.92 0.84 0.93 0.82  CALCIUM 9.3 8.7* 8.7* 8.4* 9.0  MG  --   --  2.0 2.0 1.9  PHOS  --   --   --   --  3.1   Estimated Creatinine Clearance: 48.4 mL/min (by C-G formula based on SCr of 0.82 mg/dL). Liver & Pancreas: Recent Labs  Lab 10/21/23 0221  ALBUMIN 2.8*   No results for input(s): "LIPASE", "AMYLASE" in the last 168 hours. No results for input(s): "AMMONIA" in the last 168 hours. Diabetic: No results for input(s): "HGBA1C" in the last 72 hours. No results for input(s): "GLUCAP" in the last 168 hours. Cardiac Enzymes: No results for input(s): "CKTOTAL", "CKMB", "CKMBINDEX", "TROPONINI" in the last 168 hours. No results for input(s): "PROBNP" in the last 8760 hours. Coagulation Profile: No results for  input(s): "INR", "PROTIME" in the last 168 hours. Thyroid Function Tests: No results for input(s): "TSH", "T4TOTAL", "FREET4", "T3FREE", "THYROIDAB" in the last 72 hours.  Lipid Profile: Recent Labs    10/19/23 0242  CHOL 128  HDL 39*  LDLCALC 78  TRIG 56  CHOLHDL 3.3   Anemia Panel: No results for input(s): "VITAMINB12", "FOLATE", "FERRITIN", "TIBC", "IRON", "RETICCTPCT" in the last 72 hours. Urine analysis:    Component Value Date/Time   COLORURINE YELLOW 08/12/2016 1253   APPEARANCEUR CLEAR 08/12/2016 1253   LABSPEC 1.016 08/12/2016 1253   PHURINE 5.0 08/12/2016 1253   GLUCOSEU NEGATIVE 08/12/2016 1253   HGBUR NEGATIVE 08/12/2016 1253   BILIRUBINUR NEGATIVE 08/12/2016 1253   KETONESUR NEGATIVE 08/12/2016 1253   PROTEINUR NEGATIVE 08/12/2016 1253   NITRITE NEGATIVE 08/12/2016 1253   LEUKOCYTESUR NEGATIVE 08/12/2016 1253   Sepsis Labs: Invalid input(s): "PROCALCITONIN", "LACTICIDVEN"  Microbiology: Recent Results (from the past 240 hours)  Resp panel by RT-PCR (RSV, Flu A&B, Covid) Anterior Nasal Swab     Status: None   Collection Time: 10/17/23  8:01 AM   Specimen: Anterior Nasal Swab  Result Value Ref Range Status   SARS  Coronavirus 2 by RT PCR NEGATIVE NEGATIVE Final    Comment: (NOTE) SARS-CoV-2 target nucleic acids are NOT DETECTED.  The SARS-CoV-2 RNA is generally detectable in upper respiratory specimens during the acute phase of infection. The lowest concentration of SARS-CoV-2 viral copies this assay can detect is 138 copies/mL. A negative result does not preclude SARS-Cov-2 infection and should not be used as the sole basis for treatment or other patient management decisions. A negative result may occur with  improper specimen collection/handling, submission of specimen other than nasopharyngeal swab, presence of viral mutation(s) within the areas targeted by this assay, and inadequate number of viral copies(<138 copies/mL). A negative result must be combined with clinical observations, patient history, and epidemiological information. The expected result is Negative.  Fact Sheet for Patients:  BloggerCourse.com  Fact Sheet for Healthcare Providers:  SeriousBroker.it  This test is no t yet approved or cleared by the [place] FDA and  has been authorized for detection and/or diagnosis of SARS-CoV-2 by FDA under an Emergency Use Authorization (EUA). This EUA will remain  in effect (meaning this test can be used) for the duration of the COVID-19 declaration under Section 564(b)(1) of the Act, 21 U.S.C.section 360bbb-3(b)(1), unless the authorization is terminated  or revoked sooner.       Influenza A by PCR NEGATIVE NEGATIVE Final   Influenza B by PCR NEGATIVE NEGATIVE Final    Comment: (NOTE) The Xpert Xpress SARS-CoV-2/FLU/RSV plus assay is intended as an aid in the diagnosis of influenza from Nasopharyngeal swab specimens and should not be used as a sole basis for treatment. Nasal washings and aspirates are unacceptable for Xpert Xpress SARS-CoV-2/FLU/RSV testing.  Fact Sheet for  Patients: BloggerCourse.com  Fact Sheet for Healthcare Providers: SeriousBroker.it  This test is not yet approved or cleared by the [place] FDA and has been authorized for detection and/or diagnosis of SARS-CoV-2 by FDA under an Emergency Use Authorization (EUA). This EUA will remain in effect (meaning this test can be used) for the duration of the COVID-19 declaration under Section 564(b)(1) of the Act, 21 U.S.C. section 360bbb-3(b)(1), unless the authorization is terminated or revoked.     Resp Syncytial Virus by PCR NEGATIVE NEGATIVE Final    Comment: (NOTE) Fact Sheet for Patients: BloggerCourse.com  Fact Sheet for Healthcare Providers: SeriousBroker.it  This  test is not yet approved or cleared by the [place] and has been authorized for detection and/or diagnosis of SARS-CoV-2 by FDA under an Emergency Use Authorization (EUA). This EUA will remain in effect (meaning this test can be used) for the duration of the COVID-19 declaration under Section 564(b)(1) of the Act, 21 U.S.C. section 360bbb-3(b)(1), unless the authorization is terminated or revoked.  Performed at [facility], [address] [place], [place] [address]     Radiology Studies: [facility], [facility], [facility] Chest [facility] 1 View Result Date: 10/20/2023 CLINICAL DATA:  Shortness of breath. EXAM: PORTABLE CHEST 1 VIEW COMPARISON:  10/17/2023 FINDINGS: Stable cardiac enlargement. Stable chronic lung disease. Less prominent bilateral pleural effusions with potential trace pleural fluid remaining bilaterally. No overt pulmonary edema, focal airspace consolidation or pneumothorax. IMPRESSION: Less prominent bilateral pleural effusions with potential trace pleural fluid remaining bilaterally. Stable cardiac enlargement and chronic lung disease. Electronically Signed   By: Irish Lack M.D.   On: 10/20/2023 13:18       Amana Bouska T. [place] [facility]  If 7PM-7AM, please contact night-coverage [email or web address] 10/21/2023, 11:08 AM

## 2023-10-21 NOTE — Progress Notes (Signed)
Mobility Specialist Progress Note:    10/21/23 1335  Mobility  Activity Ambulated with assistance in hallway  Level of Assistance Standby assist, set-up cues, supervision of patient - no hands on  Assistive Device None  Distance Ambulated (ft) 400 ft  Activity Response Tolerated well  Mobility Referral Yes  Mobility visit 1 Mobility  Mobility Specialist Start Time (ACUTE ONLY) 0902  Mobility Specialist Stop Time (ACUTE ONLY) 0919  Mobility Specialist Time Calculation (min) (ACUTE ONLY) 17 min   Received pt in bed having no complaints and agreeable to mobility. Pt was asymptomatic throughout ambulation. Was able to ambulate on 2L/min SPO2 WFL. Returned to room w/o fault. Left in bed w/ call bell in reach and all needs met. Left on RA, SPO2 98%.  Thompson Grayer Mobility Specialist  Please contact vis Secure Chat or  Rehab Office [phone]

## 2023-10-22 ENCOUNTER — Encounter (HOSPITAL_COMMUNITY): Payer: Self-pay | Admitting: Student

## 2023-10-22 DIAGNOSIS — I4891 Unspecified atrial fibrillation: Secondary | ICD-10-CM | POA: Diagnosis not present

## 2023-10-22 DIAGNOSIS — E782 Mixed hyperlipidemia: Secondary | ICD-10-CM

## 2023-10-22 DIAGNOSIS — I2510 Atherosclerotic heart disease of native coronary artery without angina pectoris: Secondary | ICD-10-CM | POA: Diagnosis not present

## 2023-10-22 DIAGNOSIS — J849 Interstitial pulmonary disease, unspecified: Secondary | ICD-10-CM

## 2023-10-22 DIAGNOSIS — I5021 Acute systolic (congestive) heart failure: Secondary | ICD-10-CM

## 2023-10-22 LAB — RENAL FUNCTION PANEL
Albumin: 3 g/dL — ABNORMAL LOW (ref 3.5–5.0)
Anion gap: 9 (ref 5–15)
BUN: 33 mg/dL — ABNORMAL HIGH (ref 8–23)
CO2: 28 mmol/L (ref 22–32)
Calcium: 8.9 mg/dL (ref 8.9–10.3)
Chloride: 104 mmol/L (ref 98–111)
Creatinine, Ser: 1.18 mg/dL — ABNORMAL HIGH (ref 0.44–1.00)
GFR, Estimated: 46 mL/min — ABNORMAL LOW (ref >60)
Glucose, Bld: 108 mg/dL — ABNORMAL HIGH (ref 70–99)
Phosphorus: 4.3 mg/dL (ref 2.5–4.6)
Potassium: 4.5 mmol/L (ref 3.5–5.1)
Sodium: 141 mmol/L (ref 135–145)

## 2023-10-22 LAB — MAGNESIUM: Magnesium: 2.1 mg/dL (ref 1.7–2.4)

## 2023-10-22 NOTE — Progress Notes (Signed)
Physical Therapy Treatment Patient Details Name: Erin Li MRN: [id number] DOB: 1941/03/06 Today's Date: 10/22/2023   History of Present Illness Erin Li is a 83 y.o. year old female admitted on 10/17/2023 with c/o SOB and cough. She was found to have A-fib with RVR and possible new onset CHF/COPD exacerbation. Volume overloaded with 1-2+ pitting edema and new onset of exertional dyspnea. PMH significant for COPD, HTN, HLD, and CAD.    PT Comments  Pt ambulated without an AD, with supervision, on RA with SpO2 89-94%, no DOE/SOB, and HR in the 90s. She significantly increased her ambulatory distance to ~548ft without any rest breaks. Pt engaged in stair training with supervision, using unilateral handrail and ascending/descending with a step-to pattern. Discussed HEP including a walking program and BLE exercises, energy conservation techniques, the importance of rest, and monitoring SpO2/HR using a pulse oximeter. Pt will continue to benefit from acute skilled PT to increase her independence and safety with mobility to allow d/c Home.     If plan is discharge home, recommend the following: A little help with walking and/or transfers;A little help with bathing/dressing/bathroom;Help with stairs or ramp for entrance   Can travel by private vehicle        Equipment Recommendations  None recommended by PT (Pt already has DME)    Recommendations for Other Services       Precautions / Restrictions Precautions Precautions: Fall Recall of Precautions/Restrictions: Intact Restrictions Weight Bearing Restrictions Per Provider Order: No     Mobility  Bed Mobility               General bed mobility comments: Not assess. Pt greeted in chair    Transfers Overall transfer level: Independent Equipment used: None Transfers: Sit to/from Stand Sit to Stand: Independent           General transfer comment: STS from chair. Pushed up with BUE and demonstrated good  eccentric control.    Ambulation/Gait Ambulation/Gait assistance: Supervision Gait Distance (Feet): 500 Feet Assistive device: None Gait Pattern/deviations: Step-through pattern, Decreased stride length Gait velocity: Pt reports this is her baseline pace Gait velocity interpretation: 1.31 - 2.62 ft/sec, indicative of limited community ambulator   General Gait Details: Pt ambulated with a symmetrical gait pattern, feet shoulder width apart, upright posture, and good foot clearence. She intermittently utilized the handrail in the hallway, "just because." Pt utilized PLB throughout gait. She was unable to dual-task or participate in a conversation; however, denies SOB/DOE. No standing recovery period utilized.   Stairs Stairs: Yes Stairs assistance: Supervision Stair Management: One rail Right, Forwards, Step to pattern Number of Stairs: 5 (x2) General stair comments: Ascending/Descending pt led with her RLE and maintained RUE on handrail. Steady and controlled when turning around on the steps.   Wheelchair Mobility     Tilt Bed    Modified Rankin (Stroke Patients Only)       Balance Overall balance assessment: Mild deficits observed, not formally tested                                          Communication Communication Communication: No apparent difficulties  Cognition Arousal: Alert Behavior During Therapy: WFL for tasks assessed/performed   PT - Cognitive impairments: No apparent impairments  Following commands: Intact      Cueing Cueing Techniques: Verbal cues  Exercises      General Comments General comments (skin integrity, edema, etc.): Pt ambulated on RA, SpO2 89-94% with pt utilizing PLB throughout. HR was steady in the 90s throughout gait.      Pertinent Vitals/Pain Pain Assessment Pain Assessment: No/denies pain    Home Living                          Prior Function            PT  Goals (current goals can now be found in the care plan section) Acute Rehab PT Goals Patient Stated Goal: Return Home independently Progress towards PT goals: Progressing toward goals    Frequency    Min 1X/week      PT Plan      Co-evaluation              AM-PAC PT "6 Clicks" Mobility   Outcome Measure  Help needed turning from your back to your side while in a flat bed without using bedrails?: A Little Help needed moving from lying on your back to sitting on the side of a flat bed without using bedrails?: A Little Help needed moving to and from a bed to a chair (including a wheelchair)?: None Help needed standing up from a chair using your arms (e.g., wheelchair or bedside chair)?: None Help needed to walk in hospital room?: A Little Help needed climbing 3-5 steps with a railing? : A Little 6 Click Score: 20    End of Session Equipment Utilized During Treatment: Gait belt Activity Tolerance: Patient tolerated treatment well Patient left: in chair;with call bell/phone within reach;with family/visitor present Nurse Communication: Mobility status PT Visit Diagnosis: Unsteadiness on feet (R26.81);Other abnormalities of gait and mobility (R26.89)     Time: 1478-2956 PT Time Calculation (min) (ACUTE ONLY): 21 min  Charges:    $Gait Training: 8-22 mins PT General Charges $$ ACUTE PT VISIT: 1 Visit                     Cheri Guppy, PT, DPT Acute Rehabilitation Services Office: [phone] Secure Chat Preferred   Richardson Chiquito 10/22/2023, 1:21 PM

## 2023-10-22 NOTE — Plan of Care (Signed)

## 2023-10-22 NOTE — Consult Note (Incomplete)
ELECTROPHYSIOLOGY CONSULT NOTE    Patient ID: LARUA COLLIER MRN: [id number], DOB/AGE: Aug 06, 1941 83 y.o.  Admit date: 10/17/2023 Date of Consult: 10/22/2023  Primary Physician: [name], DO Primary Cardiologist: Yates Decamp, MD  Electrophysiologist: Dr. Lalla Brothers   Referring Provider: Dr. Odis Hollingshead   Patient Profile: Erin Li is a 83 y.o. 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 Odis Hollingshead.  HPI:  TAHIRA OLIVAREZ is a 83 y.o. female who presented to [facility] ER on 10/17/23 with reports of shortness of breath and cough.   The patient reported cough that was progressive from her baseline and new grey sputum production.  She subsequently developed SOB, LE swelling and palpitations. On presentation, she was noted to be in [facility].  She was started on IV cardizem + heparin for AF.  She was admitted per [facility] for further evaluation.  She was initially started on amiodarone but this was stopped due to hx of pulmonary fibrosis. TSH was wnl. PCT negative. COVID / influenza / RSV screening was negative. Extended RVP not completed. CXR showed increased interstitial markings since prior exam and new trace pleural effusions concerning for pulmonary edema. While in ER, she converted to SR with PAC's. Cardiology was consulted.  The pt was transitioned to oral cardizem.  She was started on digoxin but it was stopped due to redness of the palms as a possible allergy. Bisoprolol was continued but little room to up titrate due to soft BP. ECHO showed LVEF 40-45%, GIIDD, LA moderately dilated, RA moderately dilated.   She denies chest pain, palpitations, dyspnea, PND, orthopnea, nausea, vomiting, dizziness, syncope, edema, weight gain, or early satiety.   Labs Potassium4.5 (02/17 0221) Magnesium  2.1 (02/17  0221) Creatinine, ser  1.18* (02/17 0221) PLT  259 (02/16 0221) HGB  13.0 (02/16 0221) WBC 8.2 (02/16 0221)  .    Past Medical History:  Diagnosis Date   COPD (chronic obstructive pulmonary disease) (HCC)    Coronary artery disease    Diverticulosis    HTN (hypertension)    Lung nodule      Surgical History:  Past Surgical History:  Procedure Laterality Date   CARDIAC CATHETERIZATION     CORONARY ANGIOPLASTY     FRACTURE SURGERY     INTRAMEDULLARY (IM) NAIL INTERTROCHANTERIC Right 02/26/2022   Procedure: INTRAMEDULLARY (IM) NAIL INTERTROCHANTRIC;  Surgeon: Bjorn Pippin, MD;  Location: [facility] OR;  Service: Orthopedics;  Laterality: Right;   TONSILLECTOMY AND ADENOIDECTOMY       Medications Prior to Admission  Medication Sig Dispense Refill Last Dose/Taking   Ascorbic Acid (VITAMIN C) 1000 MG tablet Take 1,000 mg by mouth daily.   10/17/2023 Morning   b complex vitamins tablet Take 2 tablets by mouth daily.   10/17/2023 Morning   calcium-vitamin D 250-100 MG-UNIT per tablet Take 1 tablet by mouth daily.    10/17/2023 Morning   diltiazem (CARDIZEM) 60 MG tablet Take 1 tablet (60 mg total) by mouth 3 (three) times daily. (Patient taking differently: Take 60 mg by mouth 2 (two) times daily.)   10/17/2023 Morning   diphenhydrAMINE (BENADRYL) 25 MG tablet Take 50 mg by mouth at bedtime.   Past Month   docusate sodium (COLACE) 100 MG capsule Take 1 capsule (100 mg total) by mouth 2 (two) times daily. 10 capsule  0 10/17/2023 Morning   fish oil-omega-3 fatty acids 1000 MG capsule Take 1,000 mg by mouth daily.   10/17/2023 Morning   fluticasone furoate-vilanterol (BREO ELLIPTA) 100-25 MCG/ACT AEPB inhale ONE PUFF into THE lungs EVERY DAY 60 each 11 10/17/2023 Morning   glucosamine-chondroitin 500-400 MG tablet Take 1 tablet by mouth every morning.   10/17/2023 Morning   LUTEIN PO Take 1 tablet by mouth daily.   10/17/2023 Morning   magnesium 30 MG tablet Take 30 mg by mouth daily in the afternoon.    10/17/2023 Morning   Melatonin 10 MG TABS Take 10 mg by mouth at bedtime.   10/16/2023 Bedtime   omeprazole (PRILOSEC) 20 MG capsule Take 20 mg by mouth daily.   10/17/2023 Morning   OVER THE COUNTER MEDICATION Take 2 mg by mouth daily in the afternoon. Biotin 2 mg   10/17/2023 Morning   OVER THE COUNTER MEDICATION Place 1 drop into both eyes daily as needed (dry eye). OTC eye drop   Past Week   PAPAYA PO Take 3 tablets by mouth daily as needed (heartburn).   Taking As Needed   selenium 50 MCG TABS Take 50 mcg by mouth daily.   10/17/2023 Morning   tiotropium (SPIRIVA) 18 MCG inhalation capsule Place 18 mcg into inhaler and inhale daily.   10/16/2023 Evening   vitamin B-12 (CYANOCOBALAMIN) 1000 MCG tablet Take 1,000 mcg by mouth daily.   10/17/2023 Morning   vitamin E 200 UNIT capsule Take 200 Units by mouth daily.   10/17/2023 Morning   albuterol (VENTOLIN HFA) 108 (90 Base) MCG/ACT inhaler Inhale 2 puffs into the lungs every 6 (six) hours as needed for wheezing or shortness of breath. (Patient not taking: Reported on 10/17/2023) 8 g 2 Not Taking   amoxicillin-clavulanate (AUGMENTIN) 875-125 MG tablet Take 1 tablet by mouth 2 (two) times daily. (Patient not taking: Reported on 10/17/2023) 14 tablet 0 Not Taking   amoxicillin-clavulanate (AUGMENTIN) 875-125 MG tablet Take 1 tablet by mouth 2 (two) times daily. (Patient not taking: Reported on 10/17/2023) 14 tablet 0 Not Taking   atorvastatin (LIPITOR) 10 MG tablet TAKE 1 TABLET BY MOUTH  EVERY EVENING AFTER DINNER (Patient not taking: Reported on 10/17/2023) 30 tablet 1 Not Taking   doxycycline (VIBRA-TABS) 100 MG tablet Take 1 tablet (100 mg total) by mouth 2 (two) times daily. (Patient not taking: Reported on 10/17/2023) 10 tablet 0 Not Taking   doxycycline (VIBRA-TABS) 100 MG tablet Take 1 tablet (100 mg total) by mouth 2 (two) times daily. (Patient not taking: Reported on 10/17/2023) 10 tablet 0 Not Taking   oxyCODONE (OXY IR/ROXICODONE) 5 MG immediate release  tablet Take 0.5 tablets (2.5 mg total) by mouth every 6 (six) hours as needed for severe pain. (Patient not taking: Reported on 10/17/2023) 6 tablet 0 Not Taking   predniSONE (DELTASONE) 10 MG tablet 4 x 1 day, 3 x 1 day, 2 x 1 day, 1 x 1 day, 1/2 x 1 day then stop (Patient not taking: Reported on 10/17/2023) 11 tablet 0 Not Taking   trimethoprim-polymyxin b (POLYTRIM) ophthalmic solution Place 2 drops into both eyes every 4 (four) hours. (Patient not taking: Reported on 10/17/2023) 10 mL 0 Not Taking    Inpatient Medications:   apixaban  5 mg Oral BID   arformoterol  15 mcg Nebulization BID   atorvastatin  10 mg Oral Daily   bisoprolol  7.5 mg Oral Daily   budesonide (PULMICORT) nebulizer solution  0.25 mg Nebulization BID  fluticasone  1 spray Each Nare Daily   guaiFENesin  600 mg Oral BID   pantoprazole  40 mg Oral Q1200   [START ON 10/23/2023] predniSONE  20 mg Oral Q breakfast   Followed by   [name] ON 10/24/2023] predniSONE  10 mg Oral Q breakfast   revefenacin  175 mcg Nebulization Daily   sodium chloride flush  3 mL Intravenous Q12H    Allergies:  Allergies  Allergen Reactions   Digoxin And Related Other (See Comments)    Red palms    Family History  Problem Relation Age of Onset   Heart attack Father    Kidney disease Father    COPD Mother    Lung cancer Brother 55   Pulmonary fibrosis Son        had lung transplant     Physical Exam: Vitals:   10/22/23 0010 10/22/23 0419 10/22/23 0725 10/22/23 0738  BP:  110/76 (!) 128/94   Pulse: 62 83 80   Resp:  18 18   Temp:  97.7 F (36.5 C) 97.8 F (36.6 C)   TempSrc:  Oral Oral   SpO2:  96% 92% 96%  Weight:  64.2 kg    Height:        GEN- pleasant elderly female sitting up in chair in NAD, awake/alert, normal affect  HEENT: Normocephalic, atraumatic Lungs- CTAB, Normal effort.  Heart- Regular rate and rhythm, No M/G/R.  GI- Soft, NT, ND.  Extremities- No clubbing, cyanosis, or edema   Radiology/Studies: DG  Chest Port 1 View Result Date: 10/20/2023 CLINICAL DATA:  Shortness of breath. EXAM: PORTABLE CHEST 1 VIEW COMPARISON:  10/17/2023 FINDINGS: Stable cardiac enlargement. Stable chronic lung disease. Less prominent bilateral pleural effusions with potential trace pleural fluid remaining bilaterally. No overt pulmonary edema, focal airspace consolidation or pneumothorax. IMPRESSION: Less prominent bilateral pleural effusions with potential trace pleural fluid remaining bilaterally. Stable cardiac enlargement and chronic lung disease. Electronically Signed   By: Irish Lack M.D.   On: 10/20/2023 13:18   ECHOCARDIOGRAM COMPLETE Result Date: 10/18/2023    ECHOCARDIOGRAM REPORT   Patient Name:   CHEALSEA PASKE Date of Exam: 10/18/2023 Medical Rec #:  [id number]          Height:       65.0 in Accession #:    [id number]         Weight:       147.2 lb Date of Birth:  Jul 10, 1941         BSA:          1.737 m Patient Age:    82 years           BP:           105/57 mmHg Patient Gender: F                  HR:           85 bpm. Exam Location:  Inpatient Procedure: 2D Echo, Cardiac Doppler and Color Doppler (Both Spectral and Color            Flow Doppler were utilized during procedure). Indications:    atrial fibrillation  History:        Patient has prior history of Echocardiogram examinations, most                 recent 06/07/2017. COPD, Arrythmias:Atrial Fibrillation; Risk                 Factors:Former  Smoker, Hypertension and Dyslipidemia.  Sonographer:    Delcie Roch RDCS Referring Phys: Sharla Kidney Dewayne Shorter M GHIMIRE IMPRESSIONS  1. Left ventricular ejection fraction, by estimation, is 40 to 45%. The left ventricle has mildly decreased function. The left ventricle demonstrates global hypokinesis. Left ventricular diastolic parameters are consistent with Grade II diastolic dysfunction (pseudonormalization).  2. Right ventricular systolic function is mildly reduced. The right ventricular size is mildly enlarged.  There is moderately elevated pulmonary artery systolic pressure. The estimated right ventricular systolic pressure is 59.3 mmHg.  3. Left atrial size was moderately dilated.  4. Right atrial size was moderately dilated.  5. The mitral valve is normal in structure. 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 No evidence of mitral stenosis. Moderate mitral annular calcification.  6. Tricuspid valve regurgitation is moderate.  7. The aortic valve is tricuspid. There is mild calcification of the aortic valve. Aortic valve regurgitation is not visualized. No aortic stenosis is present.  8. The inferior vena cava is normal in size with <50% respiratory variability, suggesting right atrial pressure of 8 mmHg. FINDINGS  Left Ventricle: Left ventricular ejection fraction, by estimation, is 40 to 45%. The left ventricle has mildly decreased function. The left ventricle demonstrates global hypokinesis. Strain imaging was not performed. The left ventricular internal cavity  size was normal in size. There is no left ventricular hypertrophy. Left ventricular diastolic parameters are consistent with Grade II diastolic dysfunction (pseudonormalization). Right Ventricle: The right ventricular size is mildly enlarged. No increase in right ventricular wall thickness. Right ventricular systolic function is mildly reduced. There is moderately elevated pulmonary artery systolic pressure. The tricuspid regurgitant velocity is 3.58 m/s, and with an assumed right atrial pressure of 8 mmHg, the estimated right ventricular systolic pressure is 59.3 mmHg. Left Atrium: Left atrial size was moderately dilated. Right Atrium: Right atrial size was moderately dilated. Pericardium: Trivial pericardial effusion is present. Mitral Valve: The mitral valve is normal in structure. Moderate mitral annular calcification. Moderate mitral valve regurgitation. No evidence of mitral valve stenosis. Tricuspid Valve: The tricuspid valve is  normal in structure. Tricuspid valve regurgitation is moderate. Aortic Valve: The aortic valve is tricuspid. There is mild calcification of the aortic valve. Aortic valve regurgitation is not visualized. No aortic stenosis is present. Pulmonic Valve: The pulmonic valve was normal in structure. Pulmonic valve regurgitation is trivial. Aorta: The aortic root is normal in size and structure. Venous: The inferior vena cava is normal in size with less than 50% respiratory variability, suggesting right atrial pressure of 8 mmHg. IAS/Shunts: No atrial level shunt detected by color flow Doppler. Additional Comments: 3D imaging was not performed.  LEFT VENTRICLE PLAX 2D LVIDd:         4.70 cm LVIDs:         3.70 cm LV PW:         1.10 cm LV IVS:        1.00 cm LVOT diam:     1.80 cm LV SV:         41 LV SV Index:   24 LVOT Area:     2.54 cm  RIGHT VENTRICLE          IVC RV Basal diam:  2.50 cm  IVC diam: 1.90 cm LEFT ATRIUM             Index        RIGHT ATRIUM           Index LA diam:  3.90 cm 2.25 cm/m   RA Area:     16.40 cm LA Vol (A2C):   55.9 ml 32.19 ml/m  RA Volume:   43.10 ml  24.82 ml/m LA Vol (A4C):   52.4 ml 30.18 ml/m LA Biplane Vol: 57.9 ml 33.34 ml/m  AORTIC VALVE LVOT Vmax:   82.90 cm/s LVOT Vmean:  50.900 cm/s LVOT VTI:    0.162 m  AORTA Ao Root diam: 2.70 cm Ao Asc diam:  3.20 cm TRICUSPID VALVE TR Peak grad:   51.3 mmHg TR Vmax:        358.00 cm/s  SHUNTS Systemic VTI:  0.16 m Systemic Diam: 1.80 cm Dalton McleanMD Electronically signed by Wilfred Lacy Signature Date/Time: 10/18/2023/2:34:54 PM    Final    DG Chest 2 View Result Date: 10/17/2023 CLINICAL DATA:  Shortness of breath.  Productive cough. EXAM: CHEST - 2 VIEW COMPARISON:  CT scan chest from 09/28/2023. FINDINGS: There are increased interstitial markings throughout bilateral lungs, which appears more pronounced than the prior exams and concerning for underlying mild pulmonary edema. Patient has mild underlying chronic  interstitial lung disease, better evaluated on the recent chest CT scan. There is also blunting of bilateral costophrenic angles, suggesting bilateral trace pleural effusions, which is also new since the prior study. There are probable atelectatic changes at the lung bases. No dense consolidation or lung collapse. No pneumothorax. Mildly enlarged cardio-mediastinal silhouette. No acute osseous abnormalities. The soft tissues are within normal limits. IMPRESSION: *Redemonstration of chronic interstitial lung disease. However, there are increased interstitial markings since the prior exam and new bilateral trace pleural effusions, concerning for superimposed congestive heart failure/pulmonary edema. Correlate clinically. Electronically Signed   By: Jules Schick M.D.   On: 10/17/2023 09:18   CT Chest High Resolution Result Date: 10/06/2023 CLINICAL DATA:  83 year old female with history of chronic interstitial lung disease. EXAM: CT CHEST WITHOUT CONTRAST TECHNIQUE: Multidetector CT imaging of the chest was performed following the standard protocol without intravenous contrast. High resolution imaging of the lungs, as well as inspiratory and expiratory imaging, was performed. RADIATION DOSE REDUCTION: This exam was performed according to the departmental dose-optimization program which includes automated exposure control, adjustment of the mA and/or kV according to patient size and/or use of iterative reconstruction technique. COMPARISON:  High-resolution chest CT 02/25/2022. FINDINGS: Cardiovascular: Heart size is normal. There is no significant pericardial fluid, thickening or pericardial calcification. There is aortic atherosclerosis, as well as atherosclerosis of the great vessels of the mediastinum and the coronary arteries, including calcified atherosclerotic plaque in the left main, left anterior descending, left circumflex and right coronary arteries. Calcifications of the aortic valve and mitral annulus.  Mediastinum/Nodes: No pathologically enlarged mediastinal or hilar lymph nodes. Please note that accurate exclusion of hilar adenopathy is limited on noncontrast CT scans. Esophagus is unremarkable in appearance. No axillary lymphadenopathy. Lungs/Pleura: High-resolution images again demonstrate widespread but patchy areas of ground-glass attenuation, septal thickening, thickening of the peribronchovascular interstitium, cylindrical bronchiectasis and peripheral bronchiolectasis most evident throughout the mid to lower lungs bilaterally. These findings appear mildly progressive compared to the prior examination. No frank honeycombing confidently identified at this time. Inspiratory and expiratory imaging demonstrates some mild air trapping indicative of mild small airways disease. No acute consolidative airspace disease. No pleural effusions. In the superior segment of the right lower lobe (axial image 68 of series 4) there is a 10 x 8 mm pulmonary nodule which is stable compared to prior examinations. No other larger more suspicious appearing  pulmonary nodules or masses are noted. Moderate centrilobular and paraseptal emphysema. Upper Abdomen: Aortic atherosclerosis. Musculoskeletal: Multiple old healed bilateral rib fractures. There are no aggressive appearing lytic or blastic lesions noted in the visualized portions of the skeleton. IMPRESSION: 1. 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 Mild progression of disease compared to the prior study. 2. There is also moderate centrilobular and paraseptal emphysema. 3. Aortic atherosclerosis, in addition to left main and three-vessel coronary artery disease. 4. There are calcifications of the aortic valve and mitral annulus. Echocardiographic correlation for evaluation of potential valvular dysfunction may be warranted if clinically  indicated. Aortic Atherosclerosis (ICD10-I70.0) and Emphysema (ICD10-J43.9). Electronically Signed   By: Trudie Reed M.D.   On: 10/06/2023 09:42    EKG:AF with RVR 163 bpm (personally reviewed)  TELEMETRY: AF 90-110's (personally reviewed)  DEVICE HISTORY: n/a  Assessment/Plan:  New Onset Atrial Fibrillation  CHA2DS2-VASc 5.  Query if pulmonary symptoms driving vs AF then worsening edema & subsequent resp symptoms.  -continue bisoprolol 7.5mg  daily, ventricular rates controlled  -given hx of CAD, her only option for rhythm control would be amiodarone but this may be limited by ILD.  Her QTc is borderline for Tikosyn and she would not be a candidate for ablation due to co-morbidities  -digoxin stopped due to palmer erythema 10/2023  CHFmrEF -diuresis per Cardiology  -avoid diltiazem with moderately reduced EF  CAD s/p Angioplasty  -remote in 1985, vessel unknown  -no anginal symptoms   HLD -per Cardiology       For questions or updates, please contact [facility] Please consult [email or web address] for contact info under Cardiology/STEMI.  Signed, Canary Brim, NP-C, AGACNP-BC [facility] - Electrophysiology  10/22/2023, 12:45 PM

## 2023-10-22 NOTE — Progress Notes (Signed)
Heart Failure Nurse Navigator Progress Note  PCP: [name], DO PCP-Cardiologist: Jacinto Halim Admission Diagnosis: New onset atrial fibrillation, COPD with acute exacerbation Admitted from: Home   Presentation:   Erin Li presented with shortness of breath, palpitations,  and cough x 1 week,On arrival in A-fib with RVR.  BNP 206.7, reports to being compliant with all her medications, CXR report concerning for superimposed congestive heart failure/ pulmonary edema.   Patient educated on the sign and symptoms of heart failure, daily weights, when to call her doctor or go to the ED, Diet/ fluid restrictions, taking all medications as prescribed and attending all medical appointments. Patient verbalized her understanding, a HF TOC appointment was scheduled for 10/31/2023 @ 2 pm , per patient request after her pulmonary appointment for driving purposes, She lives in [place].   ECHO/ LVEF: 40-45% New  Clinical Course:  Past Medical History:  Diagnosis Date   COPD (chronic obstructive pulmonary disease) (HCC)    Coronary artery disease    Diverticulosis    HTN (hypertension)    Lung nodule      Social History   Socioeconomic History   Marital status: Married    Spouse name: Not on file   Number of children: Not on file   Years of education: Not on file   Highest education level: Not on file  Occupational History   Not on file  Tobacco Use   Smoking status: Former    Current packs/day: 0.00    Average packs/day: 0.8 packs/day for 50.0 years (37.5 ttl pk-yrs)    Types: Cigarettes    Start date: 11/10/1961    Quit date: 11/11/2011    Years since quitting: 11.9   Smokeless tobacco: Never   Tobacco comments:    Encouraged to remain smoke free  Substance and Sexual Activity   Alcohol use: Yes   Drug use: Not on file   Sexual activity: Not on file  Other Topics Concern   Not on file  Social History Narrative   Not on file   Social Drivers of Health   Financial Resource  Strain: Not on file  Food Insecurity: No Food Insecurity (10/17/2023)   Hunger Vital Sign    Worried About Running Out of Food in the Last Year: Never true    Ran Out of Food in the Last Year: Never true  Transportation Needs: No Transportation Needs (10/17/2023)   PRAPARE - Administrator, Civil Service (Medical): No    Lack of Transportation (Non-Medical): No  Physical Activity: Not on file  Stress: Not on file  Social Connections: Socially Integrated (10/17/2023)   Social Connection and Isolation Panel [NHANES]    Frequency of Communication with Friends and Family: More than three times a week    Frequency of Social Gatherings with Friends and Family: More than three times a week    Attends Religious Services: More than 4 times per year    Active Member of [employer] or Organizations: Yes    Attends Banker Meetings: Patient declined    Marital Status: Married   Water engineer and Provision:  Detailed education and instructions provided on heart failure disease management including the following:  Signs and symptoms of Heart Failure When to call the physician Importance of daily weights Low sodium diet Fluid restriction Medication management Anticipated future follow-up appointments  Patient education given on each of the above topics.  Patient acknowledges understanding via teach back method and acceptance of all instructions.  Education  Materials:  "Living Better With Heart Failure" Booklet, HF zone tool, & Daily Weight Tracker Tool.  Patient has scale at home: yes Patient has pill box at home: yes    High Risk Criteria for Readmission and/or Poor Patient Outcomes: Heart failure hospital admissions (last 6 months): 0  No Show rate: 5 % Difficult social situation: No, lives her husband.  Demonstrates medication adherence: yes Primary Language: English Literacy level: Reading, writing, and comprehension.   Barriers of Care:   Diet/ fluid  restrictions Daily weights  Considerations/Referrals:   Referral made to Heart Failure Pharmacist Stewardship: Yes Referral made to Heart Failure CSW/NCM TOC: No Referral made to Heart & Vascular TOC clinic: Yes, 10/31/2023 @ 2 pm. Per patient she wanted her appointment after her Pulmonology appt on the [address].   Items for Follow-up on DC/TOC: Continued HF education Diet/ fluid restrictions/ daily weights   Rhae Hammock, BSN, RN Heart Failure Print production planner Chat Only

## 2023-10-22 NOTE — Consult Note (Signed)
   Electrophysiology Consultation   Patient ID: Erin Li MRN: [id number]; DOB: July 24, 1941  Admit date: 10/17/2023 Date of Consult: 10/22/2023  PCP:  [name], DO   History of Present Illness:   Ms. Erin Li is an 83 year old woman who I am seeing today for an evaluation of atrial fibrillation at the request of Dr. Raynaldo Opitz.  The patient has a history of coronary artery disease with prior PCI, COPD, interstitial lung disease on home oxygen, prior tobacco abuse.  She was admitted on October 17, 2023 with shortness of breath and cough.  She had new sputum production.  She was transiently on amiodarone but this was stopped given her history of lung disease.  She has been in atrial fibrillation with rapid ventricular rates.  She is on bisoprolol.  She was also on IV calcium channel blocker during this hospitalization.  Today she is sitting in a chair.  She is without complaint.  She feels better than when she arrived.   Past medical, surgical, social and family history reviewed.  ROS:  Please see the history of present illness.  All other ROS reviewed and negative.     Physical Exam/Data:   Vitals:   10/22/23 0419 10/22/23 0725 10/22/23 0738 10/22/23 1545  BP: 110/76 (!) 128/94  (!) 123/58  Pulse: 83 80  65  Resp: 18 18  18   Temp: 97.7 F (36.5 C) 97.8 F (36.6 C)  97.8 F (36.6 C)  TempSrc: Oral Oral  Oral  SpO2: 96% 92% 96% 92%  Weight: 64.2 kg     Height:        General:  Well nourished, well developed, in no acute distress.  Appears younger than stated age Cardiac: Irregularly irregular normal S1, S2; RRR; no murmur  Psych:  Normal affect   EKG:  The EKG was personally reviewed and demonstrates: Atrial fibrillation with rapid ventricular rates Telemetry:  Telemetry was personally reviewed and demonstrates: Atrial fibrillation.  Heart rates have been in the 90s to 110s.    Assessment and Plan:   #New onset atrial fibrillation New diagnosis likely driven by  her extensive lung disease.  CHA2DS2-VASc is 5 and she is now on Eliquis for stroke prophylaxis.  Given her extensive lung disease, noninvasive management is preferred. For now, continue bisoprolol If rates become problematic or limit her rehab/discharge, I have discussed the possibility of the amiodarone will be necessary.  Tikosyn is not a good option for her given baseline QTc prolongation.  Her ejection fraction is reduced making Multaq or sotalol not an option.  Class Ic drugs are not possible given her history of coronary artery disease with prior PCI.  She understands that if amiodarone is used, close monitoring will be required of her liver thyroid and lung function.  #Coronary artery disease No ischemic symptoms today.  Continue bisoprolol.  Limits class Ic agents.   Sheria Lang T. Lalla Brothers, MD, [facility], [facility] Cardiac Electrophysiology

## 2023-10-22 NOTE — Progress Notes (Signed)
Mobility Specialist Progress Note:   10/22/23 1018  Mobility  Activity Ambulated with assistance in hallway  Level of Assistance Contact guard assist, steadying assist  Assistive Device None  Distance Ambulated (ft) 200 ft  Activity Response Tolerated well  Mobility Referral Yes  Mobility visit 1 Mobility  Mobility Specialist Start Time (ACUTE ONLY) 1018  Mobility Specialist Stop Time (ACUTE ONLY) 1030  Mobility Specialist Time Calculation (min) (ACUTE ONLY) 12 min   Pt agreeable to mobility session. Required only minG assist for safety, no physical assistance needed. SpO2 maintained 92-98% on RA. Pt back in chair with all needs met.  Addison Lank Mobility Specialist Please contact via SecureChat or  Rehab office at [phone]

## 2023-10-22 NOTE — Progress Notes (Signed)
PROGRESS NOTE  Erin Li WGN:[id number] DOB: February 23, 1941   PCP: [name], DO  Patient is from: Home.  DOA: 10/17/2023 LOS: 5  Chief complaints Chief Complaint  Patient presents with   Shortness of Breath     Brief Narrative / Interim history: 83 year old F with PMH of COPD, CAD, HTN and HLD presented to [facility] with SOB, productive cough, DOE and edema, and admitted with working diagnosis of A-fib with RVR with new onset CHF exacerbation and possible COPD exacerbation.  She was started on steroid, bronchodilators, Cardizem drip and IV heparin, and admitted for further care.    Patient was evaluated by cardiology.  TTE with LVEF of 40 to 45%, G2 DD, RVSP of 59 mmHg and moderate MR.  TSH within normal.  Patient was transitioned to p.o. beta-blocker but went back into A-fib with RVR.  Eventually, started on IV amiodarone which was discontinued due to underlying COPD/ILD.   Cardiology following.  Currently on bisoprolol.  Digoxin discontinued due to concern for allergic reaction.  Subjective: Seen and examined earlier this morning.  No major events overnight of this morning.  HR has been in 70s to 100s overnight.  Patient has no complaints.  Denies chest pain, shortness of breath, palpitation or dizziness.  Objective: Vitals:   10/22/23 0010 10/22/23 0419 10/22/23 0725 10/22/23 0738  BP:  110/76 (!) 128/94   Pulse: 62 83 80   Resp:  18 18   Temp:  97.7 F (36.5 C) 97.8 F (36.6 C)   TempSrc:  Oral Oral   SpO2:  96% 92% 96%  Weight:  64.2 kg    Height:        Examination:  GENERAL: No apparent distress.  Nontoxic. HEENT: MMM.  Vision and hearing grossly intact.  NECK: Supple.  No apparent JVD.  RESP:  No IWOB.  Fair aeration bilaterally.  Mild rhonchi bilaterally. CVS: Irregular rhythm.  HR in 80s at rest.  Heart sounds normal.  ABD/GI/GU: BS+. Abd soft, NTND.  MSK/EXT:  Moves extremities. No apparent deformity. No edema.  SKIN: no apparent skin lesion or  wound NEURO: Awake, alert and oriented appropriately.  No apparent focal neuro deficit. PSYCH: Calm. Normal affect.   Procedures:  None  Microbiology summarized: COVID-19, influenza and RSV PCR nonreactive  Assessment and plan: New onset A-fib with RVR: Initially treated with IV Cardizem and transition to p.o. beta-blocker.  She went back into A-fib with RVR again.  TTE with LVEF of 40 to 45%, GH, G2 DD, RVSP of 59 and moderate MR.  TSH within normal.  CHA2DS2-VASc score > 5 -Cardiology on board-increase bisoprolol to 7.5 mg daily. -Amiodarone discontinued due to underlying lung disease. -Digoxin discontinued due to possible allergic reaction. -On Eliquis for anticoagulation -Weaning off steroid weaker. -Optimize electrolytes -Check pulse with ambulation.   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  Presented with dyspnea, DOE and edema.  Initial BNP about 200.  Initial CXR redemonstrated chronic ILD with increased interstitial marking, new bilateral trace pleural effusion concerning for superimposed CHF/pulmonary edema.  Repeat CXR with some improvement.  BNP trended up.  Diuresed with IV Lasix.  Net -6 L.  Creatinine slightly up. -Cardiology on board -Holding Lasix due to AKI pending cardiology evaluation. -Strict intake and output, daily weight, renal functions and electrolytes.  Possible COPD exacerbation/interstitial lung disease?  Followed by Dr. Marchelle Gearing outpatient. CRP negative. COVID-19, influenza and RSV negative.  BNP trended up.  Diuresed with IV Lasix.  Some improvement  on CXR. -Holding diuretics due to AKI. -Taper off steroid. -Continue scheduled and as needed nebulizers. -Continue PPI  Acute respiratory failure with hypoxia: Multifactorial including A-fib, COPD and CHF.  Desaturated to 86% with ambulation on room air requiring 2 L to recover. -Management as above -Incentive spirometry -Wean off oxygen as able.  AKI: Baseline Cr about 0.8.  Likely  due to diuretics. Recent Labs    10/17/23 0801 10/18/23 0247 10/19/23 0242 10/20/23 0225 10/21/23 0221 10/22/23 0221  BUN 19 18 20  25* 22 33*  CREATININE 0.81 0.92 0.84 0.93 0.82 1.18*  -Hold diuretics today  Essential hypertension: Normotensive. -Continue bisoprolol per cardiology   Hyperlipidemia -Continue Lipitor   GERD -Continue PPI  Insomnia -Melatonin at night     Body mass index is 23.55 kg/m.           DVT prophylaxis:   apixaban (ELIQUIS) tablet 5 mg  Code Status: Full code Family Communication: None at bedside Level of care: Progressive Status is: Inpatient Remains inpatient appropriate because: A-fib with RVR   Final disposition: Home Consultants:  Cardiology  55 minutes with more than 50% spent in reviewing records, counseling patient/family and coordinating care.   Sch Meds:  Scheduled Meds:  apixaban  5 mg Oral BID   arformoterol  15 mcg Nebulization BID   atorvastatin  10 mg Oral Daily   bisoprolol  7.5 mg Oral Daily   budesonide (PULMICORT) nebulizer solution  0.25 mg Nebulization BID   fluticasone  1 spray Each Nare Daily   guaiFENesin  600 mg Oral BID   pantoprazole  40 mg Oral Q1200   [START ON 10/23/2023] predniSONE  20 mg Oral Q breakfast   Followed by   [name] ON 10/24/2023] predniSONE  10 mg Oral Q breakfast   revefenacin  175 mcg Nebulization Daily   sodium chloride flush  3 mL Intravenous Q12H   Continuous Infusions:   PRN Meds:.acetaminophen **OR** acetaminophen, melatonin, ondansetron **OR** ondansetron (ZOFRAN) IV, polyethylene glycol, sodium chloride flush  Antimicrobials: Anti-infectives (From admission, onward)    None        I have personally reviewed the following labs and images: CBC: Recent Labs  Lab 10/17/23 0805 10/19/23 0242 10/20/23 0225 10/21/23 0221  WBC 5.9 10.3 8.8 8.2  NEUTROABS 3.6  --   --   --   HGB 13.6 11.5* 12.4 13.0  HCT 39.7 33.5* 37.0 38.7  MCV 98.8 99.7 98.7 98.5  PLT 264  230 236 259   BMP &GFR Recent Labs  Lab 10/18/23 0247 10/19/23 0242 10/20/23 0225 10/21/23 0221 10/22/23 0221  NA 139 139 138 140 141  K 3.8 3.9 3.5 4.1 4.5  CL 107 107 105 106 104  CO2 21* 23 24 21* 28  GLUCOSE 154* 124* 135* 118* 108*  BUN 18 20 25* 22 33*  CREATININE 0.92 0.84 0.93 0.82 1.18*  CALCIUM 8.7* 8.7* 8.4* 9.0 8.9  MG  --  2.0 2.0 1.9 2.1  PHOS  --   --   --  3.1 4.3   Estimated Creatinine Clearance: 33.6 mL/min (A) (by C-G formula based on SCr of 1.18 mg/dL (H)). Liver & Pancreas: Recent Labs  Lab 10/21/23 0221 10/22/23 0221  ALBUMIN 2.8* 3.0*   No results for input(s): "LIPASE", "AMYLASE" in the last 168 hours. No results for input(s): "AMMONIA" in the last 168 hours. Diabetic: No results for input(s): "HGBA1C" in the last 72 hours. No results for input(s): "GLUCAP" in the last 168 hours. Cardiac  Enzymes: No results for input(s): "CKTOTAL", "CKMB", "CKMBINDEX", "TROPONINI" in the last 168 hours. No results for input(s): "PROBNP" in the last 8760 hours. Coagulation Profile: No results for input(s): "INR", "PROTIME" in the last 168 hours. Thyroid Function Tests: No results for input(s): "TSH", "T4TOTAL", "FREET4", "T3FREE", "THYROIDAB" in the last 72 hours.  Lipid Profile: No results for input(s): "CHOL", "HDL", "LDLCALC", "TRIG", "CHOLHDL", "LDLDIRECT" in the last 72 hours.  Anemia Panel: No results for input(s): "VITAMINB12", "FOLATE", "FERRITIN", "TIBC", "IRON", "RETICCTPCT" in the last 72 hours. Urine analysis:    Component Value Date/Time   COLORURINE YELLOW 08/12/2016 1253   APPEARANCEUR CLEAR 08/12/2016 1253   LABSPEC 1.016 08/12/2016 1253   PHURINE 5.0 08/12/2016 1253   GLUCOSEU NEGATIVE 08/12/2016 1253   HGBUR NEGATIVE 08/12/2016 1253   BILIRUBINUR NEGATIVE 08/12/2016 1253   KETONESUR NEGATIVE 08/12/2016 1253   PROTEINUR NEGATIVE 08/12/2016 1253   NITRITE NEGATIVE 08/12/2016 1253   LEUKOCYTESUR NEGATIVE 08/12/2016 1253   Sepsis  Labs: Invalid input(s): "PROCALCITONIN", "LACTICIDVEN"  Microbiology: Recent Results (from the past 240 hours)  Resp panel by RT-PCR (RSV, Flu A&B, Covid) Anterior Nasal Swab     Status: None   Collection Time: 10/17/23  8:01 AM   Specimen: Anterior Nasal Swab  Result Value Ref Range Status   SARS Coronavirus 2 by RT PCR NEGATIVE NEGATIVE Final    Comment: (NOTE) SARS-CoV-2 target nucleic acids are NOT DETECTED.  The SARS-CoV-2 RNA is generally detectable in upper respiratory specimens during the acute phase of infection. The lowest concentration of SARS-CoV-2 viral copies this assay can detect is 138 copies/mL. A negative result does not preclude SARS-Cov-2 infection and should not be used as the sole basis for treatment or other patient management decisions. A negative result may occur with  improper specimen collection/handling, submission of specimen other than nasopharyngeal swab, presence of viral mutation(s) within the areas targeted by this assay, and inadequate number of viral copies(<138 copies/mL). A negative result must be combined with clinical observations, patient history, and epidemiological information. The expected result is Negative.  Fact Sheet for Patients:  BloggerCourse.com  Fact Sheet for Healthcare Providers:  SeriousBroker.it  This test is no t yet approved or cleared by the [place] FDA and  has been authorized for detection and/or diagnosis of SARS-CoV-2 by FDA under an Emergency Use Authorization (EUA). This EUA will remain  in effect (meaning this test can be used) for the duration of the COVID-19 declaration under Section 564(b)(1) of the Act, 21 U.S.C.section 360bbb-3(b)(1), unless the authorization is terminated  or revoked sooner.       Influenza A by PCR NEGATIVE NEGATIVE Final   Influenza B by PCR NEGATIVE NEGATIVE Final    Comment: (NOTE) The Xpert Xpress SARS-CoV-2/FLU/RSV plus  assay is intended as an aid in the diagnosis of influenza from Nasopharyngeal swab specimens and should not be used as a sole basis for treatment. Nasal washings and aspirates are unacceptable for Xpert Xpress SARS-CoV-2/FLU/RSV testing.  Fact Sheet for Patients: BloggerCourse.com  Fact Sheet for Healthcare Providers: SeriousBroker.it  This test is not yet approved or cleared by the [place] FDA and has been authorized for detection and/or diagnosis of SARS-CoV-2 by FDA under an Emergency Use Authorization (EUA). This EUA will remain in effect (meaning this test can be used) for the duration of the COVID-19 declaration under Section 564(b)(1) of the Act, 21 U.S.C. section 360bbb-3(b)(1), unless the authorization is terminated or revoked.     Resp Syncytial Virus by PCR NEGATIVE  NEGATIVE Final    Comment: (NOTE) Fact Sheet for Patients: BloggerCourse.com  Fact Sheet for Healthcare Providers: SeriousBroker.it  This test is not yet approved or cleared by the [place] FDA and has been authorized for detection and/or diagnosis of SARS-CoV-2 by FDA under an Emergency Use Authorization (EUA). This EUA will remain in effect (meaning this test can be used) for the duration of the COVID-19 declaration under Section 564(b)(1) of the Act, 21 U.S.C. section 360bbb-3(b)(1), unless the authorization is terminated or revoked.  Performed at [facility], [address], [place], [place] [address]     Radiology Studies: No results found.     Dailey Buccheri T. Arvil Utz [facility]  If 7PM-7AM, please contact night-coverage [email or web address] 10/22/2023, 10:35 AM

## 2023-10-22 NOTE — Progress Notes (Signed)
Rounding Note    Patient Name: Erin Li Date of Encounter: 10/22/2023  [facility] Cardiologist: Yates Decamp, MD   Cc: Shortness of breath Consult: New onset of atrial fibrillation and congestive heart failure  Subjective   Shortness of breath significantly improved. Ambulating without oxygen. Husband at bedside. Denies anginal chest pain, orthopnea, PND, lower extremity swelling Digoxin discontinued secondary to concerns for redness in the palms?  Allergy  Inpatient Medications    Scheduled Meds:  apixaban  5 mg Oral BID   arformoterol  15 mcg Nebulization BID   atorvastatin  10 mg Oral Daily   bisoprolol  7.5 mg Oral Daily   budesonide (PULMICORT) nebulizer solution  0.25 mg Nebulization BID   fluticasone  1 spray Each Nare Daily   guaiFENesin  600 mg Oral BID   pantoprazole  40 mg Oral Q1200   [START ON 10/23/2023] predniSONE  20 mg Oral Q breakfast   Followed by   [name] ON 10/24/2023] predniSONE  10 mg Oral Q breakfast   revefenacin  175 mcg Nebulization Daily   sodium chloride flush  3 mL Intravenous Q12H   Continuous Infusions:  PRN Meds: acetaminophen **OR** acetaminophen, melatonin, ondansetron **OR** ondansetron (ZOFRAN) IV, polyethylene glycol, sodium chloride flush   Vital Signs    Vitals:   10/22/23 0010 10/22/23 0419 10/22/23 0725 10/22/23 0738  BP:  110/76 (!) 128/94   Pulse: 62 83 80   Resp:  18 18   Temp:  97.7 F (36.5 C) 97.8 F (36.6 C)   TempSrc:  Oral Oral   SpO2:  96% 92% 96%  Weight:  64.2 kg    Height:        Intake/Output Summary (Last 24 hours) at 10/22/2023 1156 Last data filed at 10/22/2023 0803 Gross per 24 hour  Intake 620 ml  Output 2000 ml  Net -1380 ml      10/22/2023    4:19 AM 10/21/2023    4:20 AM 10/20/2023    4:04 AM  Last 3 Weights  Weight (lbs) 141 lb 8.6 oz 143 lb 4.8 oz 144 lb 10 oz  Weight (kg) 64.2 kg 65 kg 65.6 kg      Telemetry    Atrial fibrillation, rate controlled- Personally  Reviewed  ECG    N/a - Personally Reviewed  Physical Exam   GEN: No acute distress.   Neck: No JVD Cardiac: Irregularly irregular, positive S1-S2, soft holosystolic murmur heard at the apex, no rubs or gallops appreciated Respiratory: Clear to auscultation bilaterally. GI: Soft, nontender, non-distended  MS: No edema; No deformity. Neuro:  Nonfocal  Psych: Normal affect   Labs    High Sensitivity Troponin:   Recent Labs  Lab 10/17/23 0805 10/17/23 1114  TROPONINIHS 13 12     Chemistry Recent Labs  Lab 10/20/23 0225 10/21/23 0221 10/22/23 0221  NA 138 140 141  K 3.5 4.1 4.5  CL 105 106 104  CO2 24 21* 28  GLUCOSE 135* 118* 108*  BUN 25* 22 33*  CREATININE 0.93 0.82 1.18*  CALCIUM 8.4* 9.0 8.9  MG 2.0 1.9 2.1  ALBUMIN  --  2.8* 3.0*  GFRNONAA >60 >60 46*  ANIONGAP 9 13 9     Lipids  Recent Labs  Lab 10/19/23 0242  CHOL 128  TRIG 56  HDL 39*  LDLCALC 78  CHOLHDL 3.3    Hematology Recent Labs  Lab 10/19/23 0242 10/20/23 0225 10/21/23 0221  WBC 10.3 8.8 8.2  RBC  3.36* 3.75* 3.93  HGB 11.5* 12.4 13.0  HCT 33.5* 37.0 38.7  MCV 99.7 98.7 98.5  MCH 34.2* 33.1 33.1  MCHC 34.3 33.5 33.6  RDW 14.5 14.3 14.0  PLT 230 236 259   Thyroid  Recent Labs  Lab 10/18/23 0247  TSH 0.738    BNP Recent Labs  Lab 10/17/23 0810 10/20/23 0225  BNP 206.7* 833.4*    DDimer No results for input(s): "DDIMER" in the last 168 hours.   Radiology    No results found.   Cardiac Studies   NA  Patient Profile     Erin Li is a 83 y.o. 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 Lynne Logan.   Assessment & Plan    1.Atrial fibrillation Newly discovered during his hospitalization. Rate control: Bisoprolol 7.5 mg p.o. daily. Rhythm control: N/A. Thromboembolic prophylaxis: Eliquis 5 mg p.o. twice  daily. Amiodarone initially utilized but later discontinued given her pulmonary comorbidities. Digoxin discontinued secondary to concerns for possible allergic reaction-redness in palms. Her ventricular rates are well-controlled over the last 24 hours. If she continues to be well-controlled with a heart rate less than 110 bpm could be discharged home. Will need outpatient follow-up with primary cardiologist Dr. Jacinto Halim plus or minus A-fib clinic to consider rhythm management if she spontaneously does not convert to sinus rhythm. Plan of care discussed with patient and husband at bedside. No prior history of intracranial or gastrointestinal bleeding.  Risks, benefits, alternatives to anticoagulation discussed. If rate control strategy is difficult to maintain with AV nodal blocking agents will need to consult EP for consideration for other antiarrhythmic medications as she is not an ideal candidate for amiodarone and 1C agents given her history of coronary artery disease.     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 Grade II diastolic dysfunction, Mild RV dysfunction. Mod pulm HTN, mod BAE, mod MR CXR chronic changes with suggestion of pulm edema.  BNP 833 Net IO Since Admission: -5,743.55 mL [10/22/23 1156] Likely precipitated by newly discovered atrial fibrillation. Blood pressures are soft and therefore we will hold off on up titration of GDMT. Would like to focus on rate control strategy for her principal problem of A-fib.  I am very hopeful that the LVEF will improve once we optimize rate and rhythm control strategy. Recommend outpatient echocardiogram to reevaluate LVEF.   3. Coronary artery disease without angina pectoris History of prior angioplasty  High sensitive troponins negative x 2. Not on aspirin as she is currently on anticoagulation. Denies anginal chest pain or heart failure symptoms. Continue statin therapy Reemphasized the importance of secondary  prevention with focus on improving her modifiable cardiovascular risk factors such as glycemic control, lipid management, blood pressure control.   4. ILD/COPD - followed by pulmonary as outpatient.    5. Mitral regurgitation - moderate by echo - monitor with management of her systolic dysfunction  For questions or updates, please contact [facility] Please consult [email or web address] for contact info under        Signed, Ellyssa Zagal, DO  10/22/2023, 11:56 AM

## 2023-10-23 ENCOUNTER — Other Ambulatory Visit (HOSPITAL_COMMUNITY): Payer: Self-pay

## 2023-10-23 ENCOUNTER — Ambulatory Visit: Payer: Medicare Other | Admitting: Internal Medicine

## 2023-10-23 DIAGNOSIS — I2510 Atherosclerotic heart disease of native coronary artery without angina pectoris: Secondary | ICD-10-CM

## 2023-10-23 DIAGNOSIS — I5021 Acute systolic (congestive) heart failure: Secondary | ICD-10-CM | POA: Diagnosis not present

## 2023-10-23 DIAGNOSIS — I4891 Unspecified atrial fibrillation: Secondary | ICD-10-CM | POA: Diagnosis not present

## 2023-10-23 DIAGNOSIS — I10 Essential (primary) hypertension: Secondary | ICD-10-CM

## 2023-10-23 DIAGNOSIS — J9601 Acute respiratory failure with hypoxia: Secondary | ICD-10-CM

## 2023-10-23 DIAGNOSIS — J849 Interstitial pulmonary disease, unspecified: Secondary | ICD-10-CM

## 2023-10-23 DIAGNOSIS — J449 Chronic obstructive pulmonary disease, unspecified: Secondary | ICD-10-CM

## 2023-10-23 LAB — CBC
HCT: 41.4 % (ref 36.0–46.0)
Hemoglobin: 13.8 g/dL (ref 12.0–15.0)
MCH: 32.4 pg (ref 26.0–34.0)
MCHC: 33.3 g/dL (ref 30.0–36.0)
MCV: 97.2 fL (ref 80.0–100.0)
Platelets: 319 10*3/uL (ref 150–400)
RBC: 4.26 MIL/uL (ref 3.87–5.11)
RDW: 13.9 % (ref 11.5–15.5)
WBC: 7.6 10*3/uL (ref 4.0–10.5)
nRBC: 0 % (ref 0.0–0.2)

## 2023-10-23 LAB — RENAL FUNCTION PANEL
Albumin: 3 g/dL — ABNORMAL LOW (ref 3.5–5.0)
Anion gap: 12 (ref 5–15)
BUN: 30 mg/dL — ABNORMAL HIGH (ref 8–23)
CO2: 23 mmol/L (ref 22–32)
Calcium: 8.9 mg/dL (ref 8.9–10.3)
Chloride: 105 mmol/L (ref 98–111)
Creatinine, Ser: 0.89 mg/dL (ref 0.44–1.00)
GFR, Estimated: >60 mL/min (ref >60)
Glucose, Bld: 96 mg/dL (ref 70–99)
Phosphorus: 3.8 mg/dL (ref 2.5–4.6)
Potassium: 4.4 mmol/L (ref 3.5–5.1)
Sodium: 140 mmol/L (ref 135–145)

## 2023-10-23 LAB — MAGNESIUM: Magnesium: 2.2 mg/dL (ref 1.7–2.4)

## 2023-10-23 MED ORDER — PREDNISONE 10 MG PO TABS
10.0000 mg | ORAL_TABLET | Freq: Every day | ORAL | 0 refills | Status: AC
  Filled 2023-10-23: qty 2, 2d supply, fill #0

## 2023-10-23 MED ORDER — APIXABAN 5 MG PO TABS
5.0000 mg | ORAL_TABLET | Freq: Two times a day (BID) | ORAL | 0 refills | Status: AC
  Filled 2023-10-23: qty 60, 30d supply, fill #0

## 2023-10-23 MED ORDER — BISOPROLOL FUMARATE 5 MG PO TABS
7.5000 mg | ORAL_TABLET | Freq: Every day | ORAL | 0 refills | Status: DC
  Filled 2023-10-23: qty 135, 90d supply, fill #0

## 2023-10-23 NOTE — Progress Notes (Signed)
Mobility Specialist Progress Note:   10/23/23 0915  Mobility  Activity Ambulated with assistance in hallway  Level of Assistance Standby assist, set-up cues, supervision of patient - no hands on  Assistive Device None  Distance Ambulated (ft) 500 ft  Activity Response Tolerated well  Mobility Referral Yes  Mobility visit 1 Mobility  Mobility Specialist Start Time (ACUTE ONLY) 0915  Mobility Specialist Stop Time (ACUTE ONLY) 0925  Mobility Specialist Time Calculation (min) (ACUTE ONLY) 10 min   Pt agreeable to mobility session. Required only supervision during ambulation. No supplemental O2 required during session. SpO2 92%, HR 125bpm with exertion. Pt back in chair with all needs met.   Addison Lank Mobility Specialist Please contact via SecureChat or  Rehab office at [phone]

## 2023-10-23 NOTE — Discharge Summary (Signed)
Physician Discharge Summary  CRESENCIA ASMUS Li:[id number] DOB: Jul 26, 1941 DOA: 10/17/2023  PCP: [name], DO  Admit date: 10/17/2023 Discharge date: 10/23/23  Admitted From: Home Disposition: Home Recommendations for Outpatient Follow-up:  Follow up with PCP in 1 to 2 weeks. Cardiology to arrange outpatient follow-up Outpatient follow-up with pulmonology as previously planned or sooner Check CMP and CBC at follow-up Please follow up on the following pending results: None  Home Health: None Equipment/Devices: None  Discharge Condition: Stable CODE STATUS: Full code  Follow-up Information     [facility] Follow up.   Why: home oxygen Contact information: [address] [place] [address] [phone]         [facility] Follow up in 1 week(s).   Specialty: Cardiology Why: Hospital follow up 10/31/2023 @ 2 pm PLEASE bring a current medication list to appointment FREE valet parking, Entrance C, off [employer] information: [address] [place] [address] [phone]        [name], DO. Schedule an appointment as soon as possible for a visit in 1 week(s).   Specialty: Family Medicine Contact information: [address] [place] [address] [place] [address] [phone]                 Hospital course 83 year old F with PMH of COPD, CAD, HTN and HLD presented [facility] with SOB, productive cough, DOE and edema, and admitted with working diagnosis of A-fib with RVR with new onset CHF exacerbation and possible COPD exacerbation.  She was started on steroid, bronchodilators, Cardizem drip and IV heparin, and admitted for further care.     Patient was evaluated by cardiology.  TTE with LVEF of 40 to 45%, G2 DD, RVSP of 59 mmHg and moderate MR.  TSH within normal.  Patient was transitioned to p.o. beta-blocker but went back into A-fib with RVR.   Cardiology consulted and started on IV amiodarone which was discontinued due to underlying COPD/ILD.   Bisoprolol increased to 7.5 mg daily.  She was also started on digoxin but developed erythema in her fingers bilaterally that raise concern for allergic reaction.  Digoxin discontinued.    Patient has had fluctuating heart rate.  Evaluate by electrophysiologist, and they recommended continue bisoprolol 7.5 mg daily.  She is not a candidate for Tikosyn due to prolonged QT.  Not a good candidate for amiodarone due to underlying lung disease.  Not a candidate for Multaq or sotalol due to reduced ejection fraction.   On the day of discharge, patient felt well.   Ambulated 500 feet in the hallway.  HR went up to 125 briefly but down to 90s shortly after.  She was not symptomatic.  Also maintain saturation above 92% with ambulation on room air.  She is cleared for discharge by cardiology and electrophysiology for outpatient follow-up.  See individual problem list below for more.   Problems addressed during this hospitalization New onset A-fib with RVR: Initially treated with IV Cardizem and transition to p.o. beta-blocker.  She went back into A-fib with RVR again.  TTE with LVEF of 40 to 45%, GH, G2 DD, RVSP of 59 and moderate MR.  TSH within normal.  CHA2DS2-VASc score > 5.  HR remained in 60s to 90s for most part.  -Cardiology on board-increase bisoprolol to 7.5 mg daily. -Continue Eliquis for anticoagulation.   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  Presented with dyspnea, DOE and  edema.  Initial BNP about 200.  Initial CXR redemonstrated chronic ILD with increased interstitial marking, new bilateral trace pleural effusion concerning for superimposed CHF/pulmonary edema.  Repeat CXR with some improvement.  BNP trended up.  Diuresed with IV Lasix.  Net -6 L.  Creatinine slightly up.  Diuretics discontinued. -Reassess fluid status and outpatient follow-up   Possible COPD  exacerbation/interstitial lung disease?  Followed by Dr. Marchelle Gearing outpatient. CRP negative. COVID-19, influenza and RSV negative.  BNP trended up.  Diuresed with IV Lasix.  Some improvement on CXR. -Diuretics discontinued due to AKI.  -Weaning off steroid.  Discharged on prednisone 10 mg daily for 2 more days -Continue home inhalers and PPI.   Acute respiratory failure with hypoxia: Multifactorial including A-fib, COPD and CHF.  Desaturated to 86% with ambulation on room air requiring 2 L to recover.  Resolved.   AKI: Baseline Cr about 0.8.  Likely due to diuretics.  Resolved.  Essential hypertension: Normotensive. -Continue bisoprolol per cardiology   Hyperlipidemia -Continue Lipitor   GERD -Continue PPI   Insomnia -Melatonin at night          Time spent 35 minutes  Vital signs Vitals:   10/23/23 0027 10/23/23 0413 10/23/23 0800 10/23/23 0955  BP: 117/74 126/66 111/65   Pulse: 69 82 66   Temp: 98.2 F (36.8 C) 98.2 F (36.8 C) 98.4 F (36.9 C)   Resp: 18 18 17    Height:      Weight:  64.5 kg    SpO2: 94% 92% 94% 99%  TempSrc: Oral Oral Oral   BMI (Calculated):  23.65       Discharge exam  GENERAL: No apparent distress.  Nontoxic. HEENT: MMM.  Vision and hearing grossly intact.  NECK: Supple.  No apparent JVD.  RESP:  No IWOB.  Fair aeration bilaterally. CVS: Irregular rhythm.  HR in 90s.  Heart sounds normal.  ABD/GI/GU: BS+. Abd soft, NTND.  MSK/EXT:  Moves extremities. No apparent deformity. No edema.  SKIN: no apparent skin lesion or wound NEURO: Awake and alert. Oriented appropriately.  No apparent focal neuro deficit. PSYCH: Calm. Normal affect.   Discharge Instructions Discharge Instructions     Diet - low sodium heart healthy   Complete by: As directed    Discharge instructions   Complete by: As directed    It has been a pleasure taking care of you!  You have to treated for atrial fibrillation and COPD exacerbation.  Your breathing and heart  rate improved with treatment.  We are discharging you on medication for your heart rate and prednisone for your COPD.  Please review your new medication list and the directions on your medications before you take them.  We recommend you minimize or avoid over-the-counter medications which could potentially affect your heart rate/atrial fibrillation. Follow-up with your primary care doctor in 1 to 2 weeks or sooner if needed Follow-up with your pulmonologist as previously planned Follow-up with cardiologist per their recommendation.   Take care,   Increase activity slowly   Complete by: As directed       Allergies as of 10/23/2023       Reactions   Digoxin And Related Other (See Comments)   Red palms        Medication List     STOP taking these medications    diltiazem 60 MG tablet Commonly known as: CARDIZEM   diphenhydrAMINE 25 MG tablet Commonly known as: BENADRYL       TAKE these medications  atorvastatin 10 MG tablet Commonly known as: LIPITOR TAKE 1 TABLET BY MOUTH  EVERY EVENING AFTER DINNER   b complex vitamins tablet Take 2 tablets by mouth daily.   bisoprolol 5 MG tablet Commonly known as: ZEBETA Take 1.5 tablets (7.5 mg total) by mouth daily. Start taking on: October 24, 2023   Breo Ellipta 100-25 MCG/ACT Aepb Generic drug: fluticasone furoate-vilanterol inhale ONE PUFF into THE lungs EVERY DAY   calcium-vitamin D 250-100 MG-UNIT tablet Take 1 tablet by mouth daily.   cyanocobalamin 1000 MCG tablet Commonly known as: VITAMIN B12 Take 1,000 mcg by mouth daily.   docusate sodium 100 MG capsule Commonly known as: COLACE Take 1 capsule (100 mg total) by mouth 2 (two) times daily.   Eliquis 5 MG Tabs tablet Generic drug: apixaban Take 1 tablet (5 mg total) by mouth 2 (two) times daily.   fish oil-omega-3 fatty acids 1000 MG capsule Take 1,000 mg by mouth daily.   glucosamine-chondroitin 500-400 MG tablet Take 1 tablet by mouth every  morning.   LUTEIN PO Take 1 tablet by mouth daily.   magnesium 30 MG tablet Take 30 mg by mouth daily in the afternoon.   Melatonin 10 MG Tabs Take 10 mg by mouth at bedtime.   omeprazole 20 MG capsule Commonly known as: PRILOSEC Take 20 mg by mouth daily.   OVER THE COUNTER MEDICATION Take 2 mg by mouth daily in the afternoon. Biotin 2 mg   OVER THE COUNTER MEDICATION Place 1 drop into both eyes daily as needed (dry eye). OTC eye drop   PAPAYA PO Take 3 tablets by mouth daily as needed (heartburn).   predniSONE 10 MG tablet Commonly known as: DELTASONE Take 1 tablet (10 mg total) by mouth daily with breakfast for 2 days. Start taking on: October 24, 2023   selenium 50 MCG Tabs tablet Take 50 mcg by mouth daily.   tiotropium 18 MCG inhalation capsule Commonly known as: SPIRIVA Place 18 mcg into inhaler and inhale daily.   vitamin C 1000 MG tablet Take 1,000 mg by mouth daily.   vitamin E 200 UNIT capsule Take 200 Units by mouth daily.               Durable Medical Equipment  (From admission, onward)           Start     Ordered   10/19/23 1301  For home use only DME Other see comment  Once       Comments: POC with setting of 2 liters.  Question:  Length of Need  Answer:  Lifetime   10/19/23 1300   10/18/23 1634  For home use only DME oxygen  Once       Question Answer Comment  Length of Need Lifetime   Mode or (Route) Nasal cannula   Liters per Minute 2   Frequency Continuous (stationary and portable oxygen unit needed)   Oxygen conserving device Yes   Oxygen delivery system Gas      10/18/23 1633            Consultations: Cardiology Electrophysiology  Procedures/Studies:   [facility] Chest Port 1 View Result Date: 10/20/2023 CLINICAL DATA:  Shortness of breath. EXAM: PORTABLE CHEST 1 VIEW COMPARISON:  10/17/2023 FINDINGS: Stable cardiac enlargement. Stable chronic lung disease. Less prominent bilateral pleural effusions with potential  trace pleural fluid remaining bilaterally. No overt pulmonary edema, focal airspace consolidation or pneumothorax. IMPRESSION: Less prominent bilateral pleural effusions with potential trace pleural fluid  remaining bilaterally. Stable cardiac enlargement and chronic lung disease. Electronically Signed   By: Irish Lack M.D.   On: 10/20/2023 13:18   ECHOCARDIOGRAM COMPLETE Result Date: 10/18/2023    ECHOCARDIOGRAM REPORT   Patient Name:   Erin Li Date of Exam: 10/18/2023 Medical Rec #:  [id number]          Height:       65.0 in Accession #:    [id number]         Weight:       147.2 lb Date of Birth:  May 11, 1941         BSA:          1.737 m Patient Age:    82 years           BP:           105/57 mmHg Patient Gender: F                  HR:           85 bpm. Exam Location:  Inpatient Procedure: 2D Echo, Cardiac Doppler and Color Doppler (Both Spectral and Color            Flow Doppler were utilized during procedure). Indications:    atrial fibrillation  History:        Patient has prior history of Echocardiogram examinations, most                 recent 06/07/2017. COPD, Arrythmias:Atrial Fibrillation; Risk                 Factors:Former Smoker, Hypertension and Dyslipidemia.  Sonographer:    Delcie Roch RDCS Referring Phys: Sharla Kidney Dewayne Shorter M GHIMIRE IMPRESSIONS  1. Left ventricular ejection fraction, by estimation, is 40 to 45%. The left ventricle has mildly decreased function. The left ventricle demonstrates global hypokinesis. Left ventricular diastolic parameters are consistent with Grade II diastolic dysfunction (pseudonormalization).  2. Right ventricular systolic function is mildly reduced. The right ventricular size is mildly enlarged. There is moderately elevated pulmonary artery systolic pressure. The estimated right ventricular systolic pressure is 59.3 mmHg.  3. Left atrial size was moderately dilated.  4. Right atrial size was moderately dilated.  5. The mitral valve is normal in  structure. 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 No evidence of mitral stenosis. Moderate mitral annular calcification.  6. Tricuspid valve regurgitation is moderate.  7. The aortic valve is tricuspid. There is mild calcification of the aortic valve. Aortic valve regurgitation is not visualized. No aortic stenosis is present.  8. The inferior vena cava is normal in size with <50% respiratory variability, suggesting right atrial pressure of 8 mmHg. FINDINGS  Left Ventricle: Left ventricular ejection fraction, by estimation, is 40 to 45%. The left ventricle has mildly decreased function. The left ventricle demonstrates global hypokinesis. Strain imaging was not performed. The left ventricular internal cavity  size was normal in size. There is no left ventricular hypertrophy. Left ventricular diastolic parameters are consistent with Grade II diastolic dysfunction (pseudonormalization). Right Ventricle: The right ventricular size is mildly enlarged. No increase in right ventricular wall thickness. Right ventricular systolic function is mildly reduced. There is moderately elevated pulmonary artery systolic pressure. The tricuspid regurgitant velocity is 3.58 m/s, and with an assumed right atrial pressure of 8 mmHg, the estimated right ventricular systolic pressure is 59.3 mmHg. Left Atrium: Left atrial size was moderately dilated. Right Atrium: Right atrial size was moderately dilated. Pericardium: Trivial pericardial  effusion is present. Mitral Valve: The mitral valve is normal in structure. Moderate mitral annular calcification. Moderate mitral valve regurgitation. No evidence of mitral valve stenosis. Tricuspid Valve: The tricuspid valve is normal in structure. Tricuspid valve regurgitation is moderate. Aortic Valve: The aortic valve is tricuspid. There is mild calcification of the aortic valve. Aortic valve regurgitation is not visualized. No aortic stenosis is present. Pulmonic Valve:  The pulmonic valve was normal in structure. Pulmonic valve regurgitation is trivial. Aorta: The aortic root is normal in size and structure. Venous: The inferior vena cava is normal in size with less than 50% respiratory variability, suggesting right atrial pressure of 8 mmHg. IAS/Shunts: No atrial level shunt detected by color flow Doppler. Additional Comments: 3D imaging was not performed.  LEFT VENTRICLE PLAX 2D LVIDd:         4.70 cm LVIDs:         3.70 cm LV PW:         1.10 cm LV IVS:        1.00 cm LVOT diam:     1.80 cm LV SV:         41 LV SV Index:   24 LVOT Area:     2.54 cm  RIGHT VENTRICLE          IVC RV Basal diam:  2.50 cm  IVC diam: 1.90 cm LEFT ATRIUM             Index        RIGHT ATRIUM           Index LA diam:        3.90 cm 2.25 cm/m   RA Area:     16.40 cm LA Vol (A2C):   55.9 ml 32.19 ml/m  RA Volume:   43.10 ml  24.82 ml/m LA Vol (A4C):   52.4 ml 30.18 ml/m LA Biplane Vol: 57.9 ml 33.34 ml/m  AORTIC VALVE LVOT Vmax:   82.90 cm/s LVOT Vmean:  50.900 cm/s LVOT VTI:    0.162 m  AORTA Ao Root diam: 2.70 cm Ao Asc diam:  3.20 cm TRICUSPID VALVE TR Peak grad:   51.3 mmHg TR Vmax:        358.00 cm/s  SHUNTS Systemic VTI:  0.16 m Systemic Diam: 1.80 cm Dalton McleanMD Electronically signed by Wilfred Lacy Signature Date/Time: 10/18/2023/2:34:54 PM    Final    DG Chest 2 View Result Date: 10/17/2023 CLINICAL DATA:  Shortness of breath.  Productive cough. EXAM: CHEST - 2 VIEW COMPARISON:  CT scan chest from 09/28/2023. FINDINGS: There are increased interstitial markings throughout bilateral lungs, which appears more pronounced than the prior exams and concerning for underlying mild pulmonary edema. Patient has mild underlying chronic interstitial lung disease, better evaluated on the recent chest CT scan. There is also blunting of bilateral costophrenic angles, suggesting bilateral trace pleural effusions, which is also new since the prior study. There are probable atelectatic changes at  the lung bases. No dense consolidation or lung collapse. No pneumothorax. Mildly enlarged cardio-mediastinal silhouette. No acute osseous abnormalities. The soft tissues are within normal limits. IMPRESSION: *Redemonstration of chronic interstitial lung disease. However, there are increased interstitial markings since the prior exam and new bilateral trace pleural effusions, concerning for superimposed congestive heart failure/pulmonary edema. Correlate clinically. Electronically Signed   By: Jules Schick M.D.   On: 10/17/2023 09:18   CT Chest High Resolution Result Date: 10/06/2023 CLINICAL DATA:  83 year old female with history of chronic interstitial  lung disease. EXAM: CT CHEST WITHOUT CONTRAST TECHNIQUE: Multidetector CT imaging of the chest was performed following the standard protocol without intravenous contrast. High resolution imaging of the lungs, as well as inspiratory and expiratory imaging, was performed. RADIATION DOSE REDUCTION: This exam was performed according to the departmental dose-optimization program which includes automated exposure control, adjustment of the mA and/or kV according to patient size and/or use of iterative reconstruction technique. COMPARISON:  High-resolution chest CT 02/25/2022. FINDINGS: Cardiovascular: Heart size is normal. There is no significant pericardial fluid, thickening or pericardial calcification. There is aortic atherosclerosis, as well as atherosclerosis of the great vessels of the mediastinum and the coronary arteries, including calcified atherosclerotic plaque in the left main, left anterior descending, left circumflex and right coronary arteries. Calcifications of the aortic valve and mitral annulus. Mediastinum/Nodes: No pathologically enlarged mediastinal or hilar lymph nodes. Please note that accurate exclusion of hilar adenopathy is limited on noncontrast CT scans. Esophagus is unremarkable in appearance. No axillary lymphadenopathy. Lungs/Pleura:  High-resolution images again demonstrate widespread but patchy areas of ground-glass attenuation, septal thickening, thickening of the peribronchovascular interstitium, cylindrical bronchiectasis and peripheral bronchiolectasis most evident throughout the mid to lower lungs bilaterally. These findings appear mildly progressive compared to the prior examination. No frank honeycombing confidently identified at this time. Inspiratory and expiratory imaging demonstrates some mild air trapping indicative of mild small airways disease. No acute consolidative airspace disease. No pleural effusions. In the superior segment of the right lower lobe (axial image 68 of series 4) there is a 10 x 8 mm pulmonary nodule which is stable compared to prior examinations. No other larger more suspicious appearing pulmonary nodules or masses are noted. Moderate centrilobular and paraseptal emphysema. Upper Abdomen: Aortic atherosclerosis. Musculoskeletal: Multiple old healed bilateral rib fractures. There are no aggressive appearing lytic or blastic lesions noted in the visualized portions of the skeleton. IMPRESSION: 1. 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 Mild progression of disease compared to the prior study. 2. There is also moderate centrilobular and paraseptal emphysema. 3. Aortic atherosclerosis, in addition to left main and three-vessel coronary artery disease. 4. There are calcifications of the aortic valve and mitral annulus. Echocardiographic correlation for evaluation of potential valvular dysfunction may be warranted if clinically indicated. Aortic Atherosclerosis (ICD10-I70.0) and Emphysema (ICD10-J43.9). Electronically Signed   By: Trudie Reed M.D.   On: 10/06/2023 09:42       The results of significant diagnostics from this hospitalization (including imaging, microbiology,  ancillary and laboratory) are listed below for reference.     Microbiology: Recent Results (from the past 240 hours)  Resp panel by RT-PCR (RSV, Flu A&B, Covid) Anterior Nasal Swab     Status: None   Collection Time: 10/17/23  8:01 AM   Specimen: Anterior Nasal Swab  Result Value Ref Range Status   SARS Coronavirus 2 by RT PCR NEGATIVE NEGATIVE Final    Comment: (NOTE) SARS-CoV-2 target nucleic acids are NOT DETECTED.  The SARS-CoV-2 RNA is generally detectable in upper respiratory specimens during the acute phase of infection. The lowest concentration of SARS-CoV-2 viral copies this assay can detect is 138 copies/mL. A negative result does not preclude SARS-Cov-2 infection and should not be used as the sole basis for treatment or other patient management decisions. A negative result may occur with  improper specimen collection/handling, submission of specimen other than nasopharyngeal swab, presence of viral mutation(s) within the areas  targeted by this assay, and inadequate number of viral copies(<138 copies/mL). A negative result must be combined with clinical observations, patient history, and epidemiological information. The expected result is Negative.  Fact Sheet for Patients:  BloggerCourse.com  Fact Sheet for Healthcare Providers:  SeriousBroker.it  This test is no t yet approved or cleared by the [place] FDA and  has been authorized for detection and/or diagnosis of SARS-CoV-2 by FDA under an Emergency Use Authorization (EUA). This EUA will remain  in effect (meaning this test can be used) for the duration of the COVID-19 declaration under Section 564(b)(1) of the Act, 21 U.S.C.section 360bbb-3(b)(1), unless the authorization is terminated  or revoked sooner.       Influenza A by PCR NEGATIVE NEGATIVE Final   Influenza B by PCR NEGATIVE NEGATIVE Final    Comment: (NOTE) The Xpert Xpress SARS-CoV-2/FLU/RSV  plus assay is intended as an aid in the diagnosis of influenza from Nasopharyngeal swab specimens and should not be used as a sole basis for treatment. Nasal washings and aspirates are unacceptable for Xpert Xpress SARS-CoV-2/FLU/RSV testing.  Fact Sheet for Patients: BloggerCourse.com  Fact Sheet for Healthcare Providers: SeriousBroker.it  This test is not yet approved or cleared by the [place] FDA and has been authorized for detection and/or diagnosis of SARS-CoV-2 by FDA under an Emergency Use Authorization (EUA). This EUA will remain in effect (meaning this test can be used) for the duration of the COVID-19 declaration under Section 564(b)(1) of the Act, 21 U.S.C. section 360bbb-3(b)(1), unless the authorization is terminated or revoked.     Resp Syncytial Virus by PCR NEGATIVE NEGATIVE Final    Comment: (NOTE) Fact Sheet for Patients: BloggerCourse.com  Fact Sheet for Healthcare Providers: SeriousBroker.it  This test is not yet approved or cleared by the [place] FDA and has been authorized for detection and/or diagnosis of SARS-CoV-2 by FDA under an Emergency Use Authorization (EUA). This EUA will remain in effect (meaning this test can be used) for the duration of the COVID-19 declaration under Section 564(b)(1) of the Act, 21 U.S.C. section 360bbb-3(b)(1), unless the authorization is terminated or revoked.  Performed at [facility], [address] [place], [place] [address]      Labs:  CBC: Recent Labs  Lab 10/17/23 0805 10/19/23 0242 10/20/23 0225 10/21/23 0221 10/23/23 0243  WBC 5.9 10.3 8.8 8.2 7.6  NEUTROABS 3.6  --   --   --   --   HGB 13.6 11.5* 12.4 13.0 13.8  HCT 39.7 33.5* 37.0 38.7 41.4  MCV 98.8 99.7 98.7 98.5 97.2  PLT 264 230 236 259 319   BMP &GFR Recent Labs  Lab 10/19/23 0242 10/20/23 0225  10/21/23 0221 10/22/23 0221 10/23/23 0243  NA 139 138 140 141 140  K 3.9 3.5 4.1 4.5 4.4  CL 107 105 106 104 105  CO2 23 24 21* 28 23  GLUCOSE 124* 135* 118* 108* 96  BUN 20 25* 22 33* 30*  CREATININE 0.84 0.93 0.82 1.18* 0.89  CALCIUM 8.7* 8.4* 9.0 8.9 8.9  MG 2.0 2.0 1.9 2.1 2.2  PHOS  --   --  3.1 4.3 3.8   Estimated Creatinine Clearance: 44.6 mL/min (by C-G formula based on SCr of 0.89 mg/dL). Liver & Pancreas: Recent Labs  Lab 10/21/23 0221 10/22/23 0221 10/23/23 0243  ALBUMIN 2.8* 3.0* 3.0*   No results for input(s): "LIPASE", "AMYLASE" in the last 168 hours. No results for input(s): "AMMONIA" in the last 168 hours.  Diabetic: No results for input(s): "HGBA1C" in the last 72 hours. No results for input(s): "GLUCAP" in the last 168 hours. Cardiac Enzymes: No results for input(s): "CKTOTAL", "CKMB", "CKMBINDEX", "TROPONINI" in the last 168 hours. No results for input(s): "PROBNP" in the last 8760 hours. Coagulation Profile: No results for input(s): "INR", "PROTIME" in the last 168 hours. Thyroid Function Tests: No results for input(s): "TSH", "T4TOTAL", "FREET4", "T3FREE", "THYROIDAB" in the last 72 hours. Lipid Profile: No results for input(s): "CHOL", "HDL", "LDLCALC", "TRIG", "CHOLHDL", "LDLDIRECT" in the last 72 hours. Anemia Panel: No results for input(s): "VITAMINB12", "FOLATE", "FERRITIN", "TIBC", "IRON", "RETICCTPCT" in the last 72 hours. Urine analysis:    Component Value Date/Time   COLORURINE YELLOW 08/12/2016 1253   APPEARANCEUR CLEAR 08/12/2016 1253   LABSPEC 1.016 08/12/2016 1253   PHURINE 5.0 08/12/2016 1253   GLUCOSEU NEGATIVE 08/12/2016 1253   HGBUR NEGATIVE 08/12/2016 1253   BILIRUBINUR NEGATIVE 08/12/2016 1253   KETONESUR NEGATIVE 08/12/2016 1253   PROTEINUR NEGATIVE 08/12/2016 1253   NITRITE NEGATIVE 08/12/2016 1253   LEUKOCYTESUR NEGATIVE 08/12/2016 1253   Sepsis Labs: Invalid input(s): "PROCALCITONIN", "LACTICIDVEN"   SIGNED:  Almon Hercules, MD  [facility] 10/23/2023, 1:23 PM

## 2023-10-23 NOTE — TOC Transition Note (Addendum)
Transition of Care [facility]) - Discharge Note   Patient Details  Name: Erin Li MRN: [id number] Date of Birth: 15-Sep-1940  [facility]) CM/SW Contact:  Leone Haven, RN Phone Number: 10/23/2023, 10:24 AM   Clinical Narrative:    For dc today has oxygen in the room, has transportation.  Eliquis copay is 47.00.  NCM was informed by the unit secretary that this patient left the oxygen tank stating she is satting 99 percent and will not need it.  NCM contacted Zollie Beckers with [facility] to let him know he will come to pick up the oxygen tank and notify the [employer] rep who dropped off the home oxygen at patient's home to go pick it up.      Barriers to Discharge: Continued Medical Work up   Patient Goals and CMS Choice Patient states their goals for this hospitalization and ongoing recovery are:: return home   Choice offered to / list presented to : Patient      Discharge Placement                       Discharge Plan and Services Additional resources added to the After Visit Summary for     Discharge Planning Services: CM Consult Post Acute Care Choice: Durable Medical Equipment          DME Arranged: Oxygen DME Agency: [facility] Date DME Agency Contacted: 10/18/23 Time DME Agency Contacted: [id number] Representative spoke with at DME Agency: Michail Jewels Arranged: NA          Social Drivers of Health (SDOH) Interventions SDOH Screenings   Food Insecurity: No Food Insecurity (10/17/2023)  Housing: Unknown (10/17/2023)  Transportation Needs: No Transportation Needs (10/22/2023)  Utilities: Not At Risk (10/17/2023)  Financial Resource Strain: Low Risk  (10/22/2023)  Social Connections: Socially Integrated (10/17/2023)  Tobacco Use: Medium Risk (10/17/2023)     Readmission Risk Interventions     No data to display

## 2023-10-23 NOTE — Plan of Care (Signed)
  Problem: Health Behavior/Discharge Planning: Goal: Ability to manage health-related needs will improve Outcome: Progressing   Problem: Clinical Measurements: Goal: Will remain free from infection Outcome: Progressing Goal: Respiratory complications will improve Outcome: Progressing Goal: Cardiovascular complication will be avoided Outcome: Progressing   Problem: Safety: Goal: Ability to remain free from injury will improve Outcome: Progressing   Problem: Skin Integrity: Goal: Risk for impaired skin integrity will decrease Outcome: Progressing

## 2023-10-23 NOTE — Progress Notes (Signed)
   Rounding Note    Patient Name: Erin Li Date of Encounter: 10/23/2023  [facility] Cardiologist: Yates Decamp, MD   Subjective   NAEO. In chair this AM. HR improved.   Vital Signs    Vitals:   10/23/23 0027 10/23/23 0413 10/23/23 0800 10/23/23 0955  BP: 117/74 126/66 111/65   Pulse: 69 82 66   Resp: 18 18 17    Temp: 98.2 F (36.8 C) 98.2 F (36.8 C) 98.4 F (36.9 C)   TempSrc: Oral Oral Oral   SpO2: 94% 92% 94% 99%  Weight:  64.5 kg    Height:        Intake/Output Summary (Last 24 hours) at 10/23/2023 1048 Last data filed at 10/23/2023 1308 Gross per 24 hour  Intake 410 ml  Output 1550 ml  Net -1140 ml      10/23/2023    4:13 AM 10/22/2023    4:19 AM 10/21/2023    4:20 AM  Last 3 Weights  Weight (lbs) 142 lb 1.6 oz 141 lb 8.6 oz 143 lb 4.8 oz  Weight (kg) 64.456 kg 64.2 kg 65 kg      Telemetry    HR 80-100 on average. AF. Personally Reviewed  ECG    Personally Reviewed  Physical Exam    GEN: No acute distress.   Cardiac: irregularly irregular Psych: Normal affect   Assessment & Plan    #AF Improved rates.  Continue bisoprolol.  #CAD No ischemic symptoms. Continue bisoprolol.  EP will sign off. Please call with questions/concerns.    Sheria Lang T. Lalla Brothers, MD, [facility], [facility] Cardiac Electrophysiology

## 2023-10-24 ENCOUNTER — Telehealth: Payer: Self-pay

## 2023-10-24 NOTE — Transitions of Care (Post Inpatient/ED Visit) (Signed)
10/24/2023  Name: Erin Li MRN: [id number] DOB: 1941/06/15  Today's TOC FU Call Status: Today's TOC FU Call Status:: Successful TOC FU Call Completed Patient's Name and Date of Birth confirmed.  Transition Care Management Follow-up Telephone Call Date of Discharge: 10/23/23 Discharge Facility: [facility]) Type of Discharge: Inpatient Admission Primary Inpatient Discharge Diagnosis:: COPD, Atrial Fibrillation How have you been since you were released from the hospital?: Better Any questions or concerns?: No  Items Reviewed: Did you receive and understand the discharge instructions provided?: Yes Medications obtained,verified, and reconciled?: Yes (Medications Reviewed) (Full medication review/reconciliation completed) Any new allergies since your discharge?: No Dietary orders reviewed?: Yes Type of Diet Ordered:: Low Salt, Heart Healthy Do you have support at home?: Yes People in Home: spouse  Medications Reviewed Today: Medications Reviewed Today     Reviewed by Wyline Mood, RN (Case Manager) on 10/24/23 at 1309  Med List Status: <None>   Medication Order Taking? Sig Documenting Provider Last Dose Status Informant  apixaban (ELIQUIS) 5 MG TABS tablet [id number] Yes Take 1 tablet (5 mg total) by mouth 2 (two) times daily. Almon Hercules, MD Taking Active   Ascorbic Acid (VITAMIN C) 1000 MG tablet [id number] Yes Take 1,000 mg by mouth daily. [provider] Taking Active Self, Pharmacy Records  atorvastatin (LIPITOR) 10 MG tablet [id number] No TAKE 1 TABLET BY MOUTH  EVERY EVENING AFTER DINNER  Patient not taking: No sig reported   Yates Decamp, MD Not Taking Active Self, Pharmacy Records  b complex vitamins tablet [id number] Yes Take 2 tablets by mouth daily. [provider] Taking Active Self, Pharmacy Records  bisoprolol (ZEBETA) 5 MG tablet [id number] Yes Take 1.5 tablets (7.5 mg total) by mouth daily. Almon Hercules, MD Taking Active    calcium-vitamin D 250-100 MG-UNIT per tablet [id number] Yes Take 1 tablet by mouth daily.  [provider] Taking Active Self, Pharmacy Records  docusate sodium (COLACE) 100 MG capsule [id number] Yes Take 1 capsule (100 mg total) by mouth 2 (two) times daily. Pokhrel, Laxman, MD Taking Active Self  fish oil-omega-3 fatty acids 1000 MG capsule [id number] Yes Take 1,000 mg by mouth daily. [provider] Taking Active Self, Pharmacy Records  fluticasone furoate-vilanterol (BREO ELLIPTA) 100-25 MCG/ACT AEPB [id number] Yes inhale ONE PUFF into THE lungs EVERY Azzie Almas, MD Taking Active Self  glucosamine-chondroitin 500-400 MG tablet [id number] Yes Take 1 tablet by mouth every morning. [provider] Taking Active Self, Pharmacy Records  LUTEIN PO [id number] Yes Take 1 tablet by mouth daily. [provider] Taking Active Self, Pharmacy Records  magnesium 30 MG tablet [id number] Yes Take 30 mg by mouth daily in the afternoon. [provider] Taking Active Self, Pharmacy Records  Melatonin 10 MG TABS [id number] Yes Take 10 mg by mouth at bedtime. [provider] Taking Active Self, Pharmacy Records  omeprazole (PRILOSEC) 20 MG capsule [id number] Yes Take 20 mg by mouth daily. [provider] Taking Active Self  OVER THE COUNTER MEDICATION [id number] Yes Take 2 mg by mouth daily in the afternoon. Biotin 2 mg [provider] Taking Active Self, Pharmacy Records  OVER THE COUNTER MEDICATION [id number] Yes Place 1 drop into both eyes daily as needed (dry eye). OTC eye drop [provider] Taking Active Self, Pharmacy Records  PAPAYA PO [id number] Yes Take 3 tablets by mouth daily as needed (heartburn). [provider] Taking Active Self, Pharmacy Records  Med Note Merlene Morse, KIMBERLY L   Wed Oct 17, 2023 11:44 AM) prn  predniSONE (DELTASONE) 10 MG tablet [id number] Yes Take 1 tablet (10 mg total) by mouth daily with  breakfast for 2 days. Almon Hercules, MD Taking Active   selenium 50 MCG TABS [id number] Yes Take 50 mcg by mouth daily. [provider] Taking Active Self, Pharmacy Records  tiotropium [facility]) 18 MCG inhalation capsule [id number] Yes Place 18 mcg into inhaler and inhale daily. [provider] Taking Active Self  vitamin B-12 (CYANOCOBALAMIN) 1000 MCG tablet [id number] Yes Take 1,000 mcg by mouth daily. [provider] Taking Active Self, Pharmacy Records  vitamin E 200 UNIT capsule [id number] Yes Take 200 Units by mouth daily. [provider] Taking Active Self, Pharmacy Records            Home Care and Equipment/Supplies: Were Home Health Services Ordered?: NA Any new equipment or medical supplies ordered?: Yes Name of Medical supply agency?: [facility] Were you able to get the equipment/medical supplies?: Yes (Oxygen concentrator delivered to her home prior to discharge.  However, patient was able to be weaned off oxygen prior to discharge. No home O2 ordered) Do you have any questions related to the use of the equipment/supplies?: No  Functional Questionnaire: Do you need assistance with bathing/showering or dressing?: No Do you need assistance with meal preparation?: No Do you need assistance with eating?: No Do you have difficulty maintaining continence: No Do you need assistance with getting out of bed/getting out of a chair/moving?: No Do you have difficulty managing or taking your medications?: No  Follow up appointments reviewed: PCP Follow-up appointment confirmed?: Yes Date of PCP follow-up appointment?: 11/06/23 Follow-up Provider: Dr. [name] Specialist [facility] Follow-up appointment confirmed?: Yes Date of Specialist follow-up appointment?: 10/31/23 Follow-Up Specialty Provider:: Pulmonologist, Dr. Marchelle Gearing Do you need transportation to your follow-up appointment?: No Do you understand care options if your condition(s)  worsen?: Yes-patient verbalized understanding  SDOH Interventions Today    Flowsheet Row Most Recent Value  SDOH Interventions   Food Insecurity Interventions Intervention Not Indicated  Housing Interventions Intervention Not Indicated  Transportation Interventions Intervention Not Indicated  Utilities Interventions Intervention Not Indicated      Interventions Today    Flowsheet Row Most Recent Value  Chronic Disease   Chronic disease during today's visit Chronic Obstructive Pulmonary Disease (COPD), Atrial Fibrillation (AFib)  General Interventions   General Interventions Discussed/Reviewed General Interventions Discussed, General Interventions Reviewed, Doctor Visits  Doctor Visits Discussed/Reviewed Doctor Visits Discussed, Doctor Visits Reviewed, PCP, Specialist  PCP/Specialist Visits Compliance with follow-up visit  Exercise Interventions   Exercise Discussed/Reviewed Physical Activity  Physical Activity Discussed/Reviewed Physical Activity Reviewed, Physical Activity Discussed  Education Interventions   Education Provided Provided Education  Provided Verbal Education On Labs, Medication, When to see the doctor, Insurance Plans, Nutrition  Labs Reviewed --  [Repeat lab work, CMP, CBC, at follow up.]  Nutrition Interventions   Nutrition Discussed/Reviewed Decreasing salt, Nutrition Discussed, Nutrition Reviewed  [Heart Healthy Diet]  Pharmacy Interventions   Pharmacy Dicussed/Reviewed Medications and their functions, Medication Adherence  Safety Interventions   Safety Discussed/Reviewed Safety Discussed, Safety Reviewed       TOC Interventions Today    Flowsheet Row Most Recent Value  TOC Interventions   TOC Interventions Discussed/Reviewed TOC Interventions Discussed, TOC Interventions Reviewed, Post discharge activity limitations per provider, S/S of infection      Patient declines enrollment in 30-day TOC program at this time.  RNCM contact  information provided  should there be questions or concerns after our call today.  Wyline Mood BSN, RN Medical illustrator, Transition of Care [place] [facility], Population Health Direct Dial:  [phone] / Fax: [phone] Website: [email or web address]

## 2023-10-31 ENCOUNTER — Encounter: Payer: Self-pay | Admitting: Internal Medicine

## 2023-10-31 ENCOUNTER — Telehealth: Payer: Self-pay | Admitting: Internal Medicine

## 2023-10-31 ENCOUNTER — Ambulatory Visit (HOSPITAL_COMMUNITY)
Admission: RE | Admit: 2023-10-31 | Discharge: 2023-10-31 | Disposition: A | Discharge status: Home / Self Care | Payer: Medicare Other | Source: Ambulatory Visit | Attending: Physician Assistant | Admitting: Physician Assistant

## 2023-10-31 ENCOUNTER — Encounter (HOSPITAL_COMMUNITY): Payer: Self-pay

## 2023-10-31 ENCOUNTER — Other Ambulatory Visit (HOSPITAL_COMMUNITY): Payer: Self-pay

## 2023-10-31 ENCOUNTER — Ambulatory Visit: Payer: Medicare Other | Admitting: Internal Medicine

## 2023-10-31 ENCOUNTER — Telehealth (HOSPITAL_COMMUNITY): Payer: Self-pay | Admitting: Pharmacy Technician

## 2023-10-31 VITALS — BP 98/64 | HR 92 | Wt 146.2 lb

## 2023-10-31 VITALS — HR 87 | Ht 65.0 in | Wt 146.0 lb

## 2023-10-31 DIAGNOSIS — Z79899 Other long term (current) drug therapy: Secondary | ICD-10-CM

## 2023-10-31 DIAGNOSIS — I5022 Chronic systolic (congestive) heart failure: Secondary | ICD-10-CM | POA: Insufficient documentation

## 2023-10-31 DIAGNOSIS — Z87891 Personal history of nicotine dependence: Secondary | ICD-10-CM | POA: Insufficient documentation

## 2023-10-31 DIAGNOSIS — J449 Chronic obstructive pulmonary disease, unspecified: Secondary | ICD-10-CM | POA: Insufficient documentation

## 2023-10-31 DIAGNOSIS — Z955 Presence of coronary angioplasty implant and graft: Secondary | ICD-10-CM | POA: Insufficient documentation

## 2023-10-31 DIAGNOSIS — J8410 Pulmonary fibrosis, unspecified: Secondary | ICD-10-CM

## 2023-10-31 DIAGNOSIS — I110 Hypertensive heart disease with heart failure: Secondary | ICD-10-CM | POA: Insufficient documentation

## 2023-10-31 DIAGNOSIS — I4891 Unspecified atrial fibrillation: Secondary | ICD-10-CM | POA: Diagnosis not present

## 2023-10-31 DIAGNOSIS — Z5181 Encounter for therapeutic drug level monitoring: Secondary | ICD-10-CM | POA: Diagnosis not present

## 2023-10-31 DIAGNOSIS — J439 Emphysema, unspecified: Secondary | ICD-10-CM | POA: Diagnosis not present

## 2023-10-31 DIAGNOSIS — Z7901 Long term (current) use of anticoagulants: Secondary | ICD-10-CM | POA: Diagnosis not present

## 2023-10-31 DIAGNOSIS — J84112 Idiopathic pulmonary fibrosis: Secondary | ICD-10-CM

## 2023-10-31 DIAGNOSIS — Z8249 Family history of ischemic heart disease and other diseases of the circulatory system: Secondary | ICD-10-CM | POA: Insufficient documentation

## 2023-10-31 DIAGNOSIS — I2510 Atherosclerotic heart disease of native coronary artery without angina pectoris: Secondary | ICD-10-CM | POA: Diagnosis not present

## 2023-10-31 LAB — HEPATIC FUNCTION PANEL
ALT: 100 U/L — ABNORMAL HIGH (ref 0–35)
AST: 65 U/L — ABNORMAL HIGH (ref 0–37)
Albumin: 3.8 g/dL (ref 3.5–5.2)
Alkaline Phosphatase: 81 U/L (ref 39–117)
Bilirubin, Direct: 0.1 mg/dL (ref 0.0–0.3)
Total Bilirubin: 0.5 mg/dL (ref 0.2–1.2)
Total Protein: 7.2 g/dL (ref 6.0–8.3)

## 2023-10-31 LAB — BASIC METABOLIC PANEL
Anion gap: 6 (ref 5–15)
BUN: 14 mg/dL (ref 8–23)
CO2: 26 mmol/L (ref 22–32)
Calcium: 8.7 mg/dL — ABNORMAL LOW (ref 8.9–10.3)
Chloride: 108 mmol/L (ref 98–111)
Creatinine, Ser: 0.97 mg/dL (ref 0.44–1.00)
GFR, Estimated: 59 mL/min — ABNORMAL LOW (ref >60)
Glucose, Bld: 107 mg/dL — ABNORMAL HIGH (ref 70–99)
Potassium: 3.9 mmol/L (ref 3.5–5.1)
Sodium: 140 mmol/L (ref 135–145)

## 2023-10-31 LAB — BRAIN NATRIURETIC PEPTIDE: B Natriuretic Peptide: 464.6 pg/mL — ABNORMAL HIGH (ref 0.0–100.0)

## 2023-10-31 MED ORDER — FUROSEMIDE 40 MG PO TABS: 40.0000 mg | ORAL_TABLET | Freq: Every day | ORAL | 0 refills | Status: DC | PRN

## 2023-10-31 MED ORDER — JARDIANCE 10 MG PO TABS: 10.0000 mg | ORAL_TABLET | Freq: Every day | ORAL | 11 refills | Status: AC

## 2023-10-31 MED ORDER — DOXYCYCLINE HYCLATE 100 MG PO TABS: 100.0000 mg | ORAL_TABLET | Freq: Two times a day (BID) | ORAL | 1 refills | Status: DC

## 2023-10-31 MED ORDER — POTASSIUM CHLORIDE CRYS ER 20 MEQ PO TBCR: 40.0000 meq | EXTENDED_RELEASE_TABLET | Freq: Every day | ORAL | 0 refills | Status: DC | PRN

## 2023-10-31 MED ORDER — PREDNISONE 10 MG PO TABS: ORAL_TABLET | ORAL | 1 refills | Status: DC

## 2023-10-31 NOTE — Telephone Encounter (Signed)
 Rx team - low dose esriet start for IPF  Thanks    SIGNATURE    Dr. Kalman Shan, M.D., F.C.C.P,  Pulmonary and Critical Care Medicine Staff Physician, [facility] Director - Interstitial Lung Disease  Program  Pulmonary Fibrosis [facility] at [facility] [place], [place], [address]   Pager: [phone], If no answer  -> Check AMION or Try [phone] Telephone (clinical office): [phone] Telephone (research): [phone]  12:11 PM 10/31/2023

## 2023-10-31 NOTE — Patient Instructions (Addendum)
 Great to see you today!!!  Medication Changes:  START Furosemide 40 mg Daily AS NEEDED   START Potassium 40 meq (2 tabs) Daily AS NEEDED when you take Furosemide  START Jardiance 10 mg daily  Referrals:  You have been referred to the [facility], they will call you for an appointment  Special Instructions // Education:  Do the following things EVERYDAY: Weigh yourself in the morning before breakfast. Write it down and keep it in a log. Take your medicines as prescribed Eat low salt foods--Limit salt (sodium) to 2000 mg per day.  Stay as active as you can everyday Limit all fluids for the day to less than 2 liters   Follow-Up in: Thank you for allowing [place] to provider your heart failure care after your recent hospitalization. Please follow-up with Dr Jacinto Halim at [facility], they will call you for an appointment.   If you have any questions, issues, or concerns before your next appointment please call our office at [phone], opt. 2 and leave a message for the triage nurse.

## 2023-10-31 NOTE — Progress Notes (Signed)
 OV 04/10/2017   Chief Complaint  Patient presents with   Follow-up    Pt states her breathing is unchanged since last OV. Pt states she has mild non prod cough. Pt denies CP/tightness, f/c/s, and chest congestion.    Follow-up COPD but now has concerned that she has developed interstitial lung disease  She is here with her husband. Back in 2015 a high-resolution CT chest because I thought I heard crackles and thoracic radiology ruled out interstitial lung disease. In the interim she's had lung cancer screening CT chest at overall stability in her pulmonary function test. But there was concern because of crackles that she might have interstitial lung disease so we did a high-resolution CT chest and July 2018 and thoracic radiologist reported as definite precedence of ILD although it is not a UIP pattern. [name] She does say that her son and over the lung transplant in [place] 8 years ago at age of 20 because of IPF. She did have autoimmune panel that is only trace positive for ENP . 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 She denies any humidifier or mildew or mold in the house. The CT chest also shows coronary artery calcification. Of note I personally visualized the CT chest.  ACCP ILD questiin  - symptioms: occ cough x 2 years with some night cough and mild phleogn ona nd off. Very mild non troubling dyspnea x 3 years - pasth medical : GERD +, mild dysphagia + - personal exposure x -: +ver for remote use of rec / street drugs. Cig smoked age 52 though 83 at 86 cigs/dat - fam hx: COPD +, son with transplant for ?  IPF at age 18 - home exposurE: does have hot tube - uses it once a month. 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 Range 04/03/2017 10:18  CK Total Latest Ref Range: 7 - 177 U/L 63  CK-MB Latest Ref Range: 0.3 - 4.0 ng/mL 2.4  Aldolase Latest Ref Range: <=8.1 U/L 3.5  Sed Rate Latest Ref Range: 0 - 30 mm/hr 20  Anit Nuclear Antibody(ANA) Latest Ref Range: NEGATIVE  NEG  Angiotensin-Converting Enzyme Latest Ref Range: 9 - 67 U/L 67  Cyclic Citrullin Peptide Ab Latest Units: Units <16  ds DNA Ab Latest Units: IU/mL 1  ENA RNP Ab Latest Ref Range: 0.0 - 0.9 AI 1.4 (H)  RA Latex Turbid. Latest Ref Range: <14 IU/mL <14  SSA (Ro) (ENA) Antibody, IgG Latest Ref Range: <1.0 NEG AI  <1.0 NEG  SSB (La) (ENA) Antibody, IgG Latest Ref Range: <1.0 NEG AI  <1.0 NEG  Scleroderma (Scl-70) (ENA) Antibody, IgG Latest Ref Range: <1.0 NEG AI  <1.0 NEG    OV 09/28/2017  Chief Complaint  Patient presents with   Follow-up    PFT done today. States she has been doing good and has no complaints of cough, SOB, or CP.    Fu mixed empyhsema and pulmonary fibrosis  For  years of only known who is a COPD patient with a family history of pulmonary fibrosis.  But it appears that in 2018 she started developing signs of pulmonary fibrosis with crackles  and findings of indeterminate UIP on CT chest July 2018.  She has family history of pulmonary fibrosis and that her son had lung transplant for the same.  She now presents for follow-up.  In the interim she started feeling better.  She is less dyspneic.  She continues with the Brio.  There are no new issues.  Pulmonary function test done as documented below shows an improvement.  Therefore instead of biopsy she feels keeping an eye on this is a better approach.    Walking desaturation test on 09/28/2017 185 feet x 3 laps on ROOM AIR:  did NOT desaturate. Rest pulse ox was 99%, final pulse ox was96 %. HR response 78 /min at rest to 105/min at peak exertion. Patient Erin Li  Did nto Desaturate < 88% . Erin Li yes  Desaturated </= 3% points.  Erin Li yes did get tachyardic   OV 01/01/2018  Chief Complaint  Patient presents with   Follow-up    PFT done today.  Pt states she has been doing good since last visit. Had a cold mid January 2019 but has been better since. Denies any complaints of cough, SOB, or CP.   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 She continues on Brio. Since last visit January 2019 she continues to feel better. COPD cat score is very minimal as documented below. She had pulmonary function test today and this shows stability in her FVC after initial improvement. Her DLCO is even better. She is on observation course regarding her ILD given the improvement in her pulmonary function test. Her last CT scan of the chest was July 2018        OV 07/09/2018  Subjective:  Patient ID: Erin Li, female , DOB: May 05, 1941 , age 83 y.o. , MRN: [id number] , ADDRESS: [address] [place] [address]   07/09/2018 -   Chief Complaint  Patient presents with   Consult    Alot of coughing producing mucus no color at this time.     HPI Erin Li 83 y.o. 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  She tells me that for the last 3 weeks has had increased cough with congestion and white sputum more than baseline but activities of daily living and shortness of breath or guarding is all unchanged and she has good effort tolerance.  There are no new other issues.  In terms of ILD: Last visit I thought I will be in improved based on improvement in pulmonary function test but actually that might of been a red herring because today's pulmonary function test shows a decline which is in line with previous pulmonary function test showing stability.  She certainly feels stable.  In  July 2019 she had a low-dose CT scan for lung cancer and this ruled out lung cancer on screening.  They were able to report some mildly changes at the base.  She is not interested in surgical lung biopsy because her son who had pulmonary fibrosis finally died from 8 years of transplant from pulmonary embolism, stroke and acute renal failure and chronic critical illness in the rehab facility.      OV 10/08/2019  Subjective:  Patient ID: Erin Li, female , DOB: August 09, 1941 , age 28 y.o. ,  MRN: [id number] , ADDRESS: [address] [place] [address]   10/08/2019 -   Chief Complaint  Patient presents with   Follow-up    Pt states she has been doing well since last visit and states her breathing has been doing well. Pt will have an occ cough.   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  Last seen in #2019.  She presents with her husband.  Since the onset of the COVID-19 pandemic there have been in the house and socially isolated.  She has been doing well without any problems overall stable.  She not exercising as much as she used to.  She not taking any inhalers for her COPD.  She only takes Mucinex.  Therefore her cough symptom with mucus is slightly worse than before.  She had high-resolution CT scan of the chest which I reviewed the report and agree with the findings and I interpreted as well with visualization: She has emphysema associated with alternate diagnosis pattern.  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  She still grieving from her son who died in Sep 17, 2019from pulmonary fibrosis.    OV 06/01/2020   Subjective:  Patient ID: Erin Li, female , DOB: 17-Aug-1941, age 83 y.o. years. , MRN: [id number],  ADDRESS: [address] [place]  [address] PCP  Pearson Grippe, MD Providers : Treatment Team:  Attending Provider: Kalman Shan, MD   Chief Complaint  Patient presents with   Follow-up    ILD, no concerns    predominantly COPD with some amount of basal pulmonary fibrosis  N(atlernate to UIP)  in the setting of her son having pulmonary fibrosis alt- decieasedd 2019   HPI Erin Li 83 y.o. -presents for follow-up.  Last seen February 2021.  Since then she is doing stable.  She continues to be stable.  Her symptom scores are very minimal and shows stability.  She had pulmonary function test August 2021 and compared to November 2019 she is stable.  Her walking desaturation test is also stable.  Her last CT scan was January 2021.  She prefers to have monitoring for pulmonary fibrosis because of her son's issue.  She likes the strategy of alternating pulmonary function test and also CT scan of the chest.      OV 12/06/2021  Subjective:  Patient ID: Erin Li, female , DOB: 1941/02/20 , age 42 y.o. , MRN: [id number] , ADDRESS: [address] [place] [address] PCP Pearson Grippe, MD Patient Care Team: Pearson Grippe, MD as PCP - General (Internal Medicine)  This Provider for this visit: Treatment Team:  Attending Provider: Kalman Shan, MD   Follow-up emphysema with some amount of ILD.  Last seen in 21-May-2020.  It is now 18 months since I last saw her.  Doing well overall.  Last month she did have a flareup requiring antibiotics and prednisone for COPD exacerbation.  She continues on Chelsea.  No new health issues.  April 23, 2022 she plans to go to [place].  She is wanting antibiotic and prednisone.  Her last pulmonary function test was a year and a half ago.  Her last CT scan of the chest was a year ago or more.  She is agreeable to have follow-up reassessment done.  Symptom score  is as below and stable.  Walking desaturation test shows a tendency to desaturate but still acceptable.    OV  05/18/2022   Subjective:  Patient ID: Erin Li, female , DOB: Oct 10, 1940, age 8 y.o. years. , MRN: [id number],  ADDRESS: [address] [place] [address] PCP  [name], DO Providers : Treatment Team:  Attending Provider: Kalman Shan, MD Patient Care Team: [name], DO as PCP - General (Family Medicine)    Chief Complaint  Patient presents with   Follow-up    PFT performed today.  Pt states she has been doing okay since last visit. States for the past 2 weeks she has been coughing a lot getting up white phlegm and states cough is worse at night.    Follow-up emphysema with some amount of ILD   HPI Erin Li 59 y.o. -[son also has ILD].  Returns for follow-up.  In August 2023 she went to [place] with her female side of the family.  She return May 02, 2022.  She had a great time.  No issues walking around the.  Then after coming back for the last 2 weeks she has respiratory symptoms particularly increased cough for the last 2 to 3 weeks associated with white sputum occasionally yellow.  Definitely worse than baseline.  Moderate in severity and but no fever.  No chills no worsening shortness of breath.  No wheezing.  No edema no orthopnea no chills.  However pulmonary function test today shows a decline [see below].  Despite this increase in symptoms her subjective symptom score below is stable.  Of note her husband is here with her today.    PFT   Narrative & Impression  CLINICAL DATA:  Interstitial lung disease   EXAM: CT CHEST WITHOUT CONTRAST   TECHNIQUE: Multidetector CT imaging of the chest was performed following the standard protocol without intravenous contrast. High resolution imaging of the lungs, as well as inspiratory and expiratory imaging, was performed.   RADIATION DOSE REDUCTION: This exam was performed according to the departmental dose-optimization program which includes automated exposure control, adjustment of the  mA and/or kV according to patient size and/or use of iterative reconstruction technique.   COMPARISON:  10/01/2020, 09/23/2019, 09/27/2018   FINDINGS: Cardiovascular: Aortic atherosclerosis. Normal heart size. Left and right coronary artery calcifications. No pericardial effusion.   Mediastinum/Nodes: No enlarged mediastinal, hilar, or axillary lymph nodes. Thyroid gland, trachea, and esophagus demonstrate no significant findings.   Lungs/Pleura: Moderate centrilobular and paraseptal emphysema. Unchanged mild pulmonary fibrosis in a pattern with apical to basal gradient, featuring irregular peripheral interstitial opacity, septal thickening, and ground-glass, mild, tubular bronchiectasis, with small areas of subpleural bronchiolectasis at the lung bases. No significant air trapping on expiratory phase imaging. Multiple small bilateral pulmonary nodules are stable and benign, largest a 0.9 cm nodule of the superior segment right lower lobe (series 4, image 57). No pleural effusion or pneumothorax.   Upper Abdomen: No acute abnormality.   Musculoskeletal: No chest wall abnormality. No suspicious osseous lesions identified.   IMPRESSION: 1. Unchanged mild pulmonary fibrosis in a pattern with apical to basal gradient, featuring irregular peripheral interstitial opacity, septal thickening, and ground-glass, with small areas of subpleural bronchiolectasis at the lung bases. Fibrotic findings are not significantly changed compared to immediate prior examination, although as previously reported are worsened over a longer period of time dating back to at least 2015. Findings are categorized as probable UIP per consensus guidelines: Diagnosis of Idiopathic Pulmonary Fibrosis:  An Official ATS/ERS/JRS/ALAT Clinical Practice Guideline. Am Rosezetta Schlatter Crit Care Med Vol 198, Iss 5, [phone], May 05 2017. 2. Multiple small bilateral pulmonary nodules are stable and definitively benign. 3.  Emphysema. 4. Coronary artery disease.   Aortic Atherosclerosis (ICD10-I70.0) and Emphysema (ICD10-J43.9).     Electronically Signed   By: Jearld Lesch M.D.   On: 02/25/2022 17:51    No results found.     OV 08/01/2022  Subjective:  Patient ID: Erin Li, female , DOB: 1941/02/16 , age 30 y.o. , MRN: [id number] , ADDRESS: [address] [place] [address] PCP [name], DO Patient Care Team: [name], DO as PCP - General (Family Medicine)  This Provider for this visit: Treatment Team:  Attending Provider: Kalman Shan, MD  Follow-up emphysema with some amount of ILD  08/01/2022 -   Chief Complaint  Patient presents with   Follow-up     HPI Erin Li 83 y.o. -presents with her husband.  After the recent flareup she is better.  However she ended up with back pain and sciatica 6 weeks ago.  She got prednisone course but it did not help.  Since then she has not acupuncture with massage therapy and she is significantly better although she still requiring a cane.  Therefore we did not walk her today.  She had a repeat pulmonary function test that shows slight improvement in FVC but the DLCO is still the same.  Her FVC over the last 5 to 8 years is range bound but the DLCO seems to taken a turn for the worse compared to several years ago.  Still the decline is not greater than 5 to 10% in the last 1 or 2 years to meet progressive phenotype definition.  I did visualize the CT scan currently with and also compared it to the old one.  There is definitely more ILD findings now compared to 2015 but is reported she does not meet the classic definition of progressive phenotype.  The pet bird she had is now deceased for the last 5 years.  Her son himself had IPF/ILDand had lung transplant several years ago.  She is willing to see genetics counselor.  Her husband prefers an aggressive approach with potential antifibrotic's because of the slow progression in  the future course being uncertain.  She prefers a more wait and watch approach.  We took a shared decision making to discuss in case conference repeat pulmonary function test and then decide    Husband is particularly worried about chronic cough.  Is particular worse in the night.  Is been going on for a long time but worse in the last 6 months definitely much worse at night.  She taking fish oil for many years but recently she has also been having heartburn.  There is no wheezing.  He is worried if it could be related to ILD.  Xxxxxxxxxxxxxxxxxxxxxxxxxxxxxxxx  DEC 2023 Brief History: "MR patient for > 18 years. Followed as gold stage 2 copd. but had bird that died 5 years ago. Son had transplant for IPF in [place] 10-15 years ago. She even was in copd trial.  Ratios always obstrucive with fev1 1.4L/67% and current DLCO 10.  Several years ago heard crackles in lung base but CT always read as no ILD and just atelectasis. Now CT says ILD and worse. FVC in 2018 - 2.2-2.38L -> now 2.44 DLC in 2018- 11.5-12.5 -> now 10.8 Does not meet Progr Phenotype definition for ILD (>  5-10% decline in 1-2 years) Questions:  Does she have ILD  Is this UIP  Would you biopsy OR Rx OR watch?"  iscussion synopsis:  2018 -> june 2023: Ches CT bibasalar ILD (TB, GGO, subplerua reticualtion, No HC. No air trapping). Per Poff - not progressing.  in retrospect even in 2018 ILD +.. In 2015: ILA. Definite progression since 2015.  NEW RLL NODULAR OPACITY - June 2023 reported in conference but not in officia report - What is the final conclusion per 2018 ATS/Fleischner Criteria - PROBABLE UIP  - Concordance with official report: DISCORDANT - Conference has picked up a new RLL NODULE  Pathology discussion of biopsy NO:    MDD Impression/Recs: 1) nodule - consier PET/ repeat CT 2) Prob UIP (fam hx,  emphysema, age > 65,s eroplogy) = IPF -> Rx with antifibotic. 3) emphysema + -> currently ILD > emphysema but barely      IMPRESSION: Dominant nodule in the superior segment of the right lower lobe has been present since 2014 consistent with a benign process. Other nodules also demonstrated long-term presence consistent with benign process.   Underlying emphysematous lung changes. Interstitial process also seen as on the prior high-resolution chest CT scan.     Electronically Signed   By: Karen Kays M.D.   On: 09/05/2022 18:24 OV 09/22/2022  Subjective:  Patient ID: Erin Li, female , DOB: [birth year] , age 9 y.o. , MRN: [id number] , ADDRESS: [address] [place] [address] PCP [name], DO Patient Care Team: [name], DO as PCP - General (Family Medicine)  This Provider for this visit: Treatment Team:  Attending Provider: Kalman Shan, MD    09/22/2022 -   Chief Complaint  Patient presents with   Follow-up    Pft review,      HPI Erin Li 83 y.o. -returns for follow-up.  At this visit she is here with her husband.  He is acting as an independent historian.  She tells me that overall she is stable but she is having chronic cough.  She is quite bothered by the chronic cough.  She tells me that over the last year or 2 it slowly gotten worse but since last visit it is stable.  Nighttime cough is worse than daytime.  It does wake her husband up.  She does bring "junk".  It is clear but sometimes slightly yellow-tinged.  There is no wheezing.  Happening despite Breo.  Happening despite stopping fish oil.  Her shortness of breath is stable.  We discussed several things today  -Chronic cough: Okay to restart fish oil but will also check blood allergy panel and CBC with differential -COPD: Is stable.  She will continue inhaler therapy  -Right lower lobe lung nodule: This was parted during the case conference.  We did a super D CT chest and the current radiologist has said it has been stable for 10 years.  No further follow-up  -Pulmonary fibrosis: The  concern at the conference is that she has IPF based on probable UIP on the CT chest, slow emergence and progression and also son having pulmonary fibrosis IPF.  Last serologies showed trace ANA positive but that was 10 years ago.  She is agreeable for repeat.  We discussed therapeutic options including standard of care nintedanib.  She said that many years ago she did have a cardiac cath and she did have a blockage but no stent was placed or angioplasty was done.  We also  discussed standard of care pirfenidone and the side effects.  We then discussed a modified pirfenidone through a clinical trial [facility].  The side effect profile here is less but for the first 6 months she could get placebo standard of care pirfenidone or the study drug.  Then after 6 months should be randomized to different doses of the study drug which research is on show for his one third less side effect profile than standard of care pirfenidone.  She is more interested in this option.[name]  However we do need to get serologies.       OV 12/07/2022  Subjective:  Patient ID: Erin Li, female , DOB: November 10, 1940 , age 39 y.o. , MRN: [id number] , ADDRESS: [address] [place] [address] PCP [name], DO Patient Care Team: [name], DO as PCP - General (Family Medicine)  This Provider for this visit: Treatment Team:  Attending Provider: Kalman Shan, MD    12/07/2022 -   Chief Complaint  Patient presents with   Follow-up    F/up PFT     HPI Erin Li 83 y.o. -returns for follow-up.  At last visit after multidisciplinary case conference gave her a definite of IPF diagnosis.  This is slowly crept in.  Should try to join a clinical trial but she did not qualify because of the combined emphysema.  Then we applied for nintedanib but she was told in February 2024 that her co-pay was over $3000.  I later found out and explained to her today is because of the new inflation reduction act and the donut  hole issues around co-pay before they reach catastrophic coverage.  She and her husband process the information but they do not believe they have the capital resources to spend $3000 on co-pay.  So she is opting to stay on supportive care for her ILD.  She feels stable.  In fact her pulmonary function test is slightly better/stable compared to the recent several months.  She is reassured by this.  Last summer she traveled to [place] but this summer they are not going to travel anywhere.  She has not had RSV vaccine.  I advocated for this.  She says she will have it.  Of note her walking desaturation test today was stable.  For her emphysema she continues on Breo     OV 07/12/2023  Subjective:  Patient ID: Erin Li, female , DOB: 1941/03/05 , age 52 y.o. , MRN: [id number] , ADDRESS: [address] [place] [address] PCP [name], DO Patient Care Team: [name], DO as PCP - General (Family Medicine)  This Provider for this visit: Treatment Team:  Attending Provider: Kalman Shan, MD    07/12/2023 -   Chief Complaint  Patient presents with   Follow-up    Pt denies any concerns     HPI Erin Li 83 y.o. -returns for follow-up.  Presents with her husband.  In August 2024 she went to [place] and also [place] with her daughter and granddaughter.  The husband did not accompany her according to him.  He is an independent historian today.  Both shortness of breath and cough are stable [see symptom score below].  However the cough is affecting her quality of life.  Husband states at night he is not able to sleep with her because of the cough and he also hears rattling.  Record review shows normal blood eosinophil and normal allergy testing essentially.  She wants ways  to control the cough.  The cough is made worse by eating sweets particularly chocolates.  She admits that she might have acid reflux.  She did try to stop the fish oil to see if the cough would  improve but it did not.  She does not think fish oil is the cause for this.  Otherwise no hospitalizations emergency room visits.  Only new medical problems conjunctivitis.     OV 10/31/2023  Subjective:  Patient ID: Erin Li, female , DOB: 25-Aug-1941 , age 61 y.o. , MRN: [id number] , ADDRESS: [address] [place] [address] PCP [name], DO Patient Care Team: [name], DO as PCP - General (Family Medicine) Yates Decamp, MD as PCP - Cardiology (Cardiology)  This Provider for this visit: Treatment Team:  Attending Provider: Kalman Shan, MD  #Pulmonary emphysema  -On Breo  #IPF with history of family of pulmonary fibrosis -indolent and slowly increased.   - Son with interstitial lung disease and status post transplant died 12-Nov-2017 approx - AGreed to do low dose esbreit Feb 2025  10/31/2023 -   Chief Complaint  Patient presents with   Hospitalization Follow-up    Hospitalized from 2/12- 10/23/23 new onset AFib. Breathing has improved back to her baseline. She has occ cough with yellow to white sputum.      HPI Erin Li 83 y.o. -returns for follow-up for combined pulmonary fibrosis emphysema.  I last saw her in November 2024.  Then based on husband's story he is an independent historian and also her story and also review of the external medical record she was discharged 10/23/2023 after admission for atrial fibrillation.  She is now on Eliquis and bisoprolol.  Shortness of breath was worsening before the A-fib admission but is now back to baseline.  Her symptoms are minimal.  Her exercise hypoxemia test is stable.  However she did tell me that she had a COPD flareup while in the hospital.  She now wants antibiotic and prednisone handy.  I did give her a prescription for this doxycycline and short course prednisone with a refill.  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  There is progression.  It is still mild in severity and she is not feeling it.  However it is there and it has progressed.  Her son had transplant from fibrosis.  In the past she had declined antifibrotic's but we went over this again.  Went over the inflation reduction act and the total co-pay burden.  Went over pirfenidone and it side effect profile.  Based on all this she is willing to try the low-dose protocol.  We did discuss the risk benefits and limitations.   Esbriet/Pirfenidone requires intensive drug monitoring due to high concerns for Adverse effects of , including  Drug Induced Liver Injury, significant GI side effects that include but not limited to Diarrhea, Nausea, Vomiting,  and other system side effects that include Fatigue, headaches, weight loss and other side effects such as skin rash. These will be monitored with  blood work such as LFT initially once a month for 6 months and then quarterly     SYMPTOM SCALE -  06/01/2020  12/06/2021  05/18/2022 Copd exac  08/01/2022  07/12/2023  10/31/2023   O2 use ra ra0 ra ra ra ra  Shortness of Breath 0 -> 5 scale with 5 being worst (score 6 If unable to do) 0      At rest 0 0 0 0 0 0  Simple tasks - showers, clothes change, eating, shaving 0 00 0 0 0 0  Household (dishes, doing bed, laundry) 0  0 0 0 0  Shopping 0 0 0 0 0 0  Walking level at own pace 0 0 0 0 0 0  Walking up Stairs 2 2 2 3 3 2   Total (30-36) Dyspnea Score 2 2 2 3 3 2   How bad is your cough? 2 3 3  0 3 4  How bad is your fatigue 0 0 0 00 00 2  How bad is nausea 00 0 0 0 0 0  How bad is vomiting?  0 0 0 0 0 0  How bad is diarrhea? 0 0 0 0 0 0  How bad is anxiety? 0 0 0 0 0 0  How bad is depression 0 0 0 0 0 0  ra ra  Simple office walk 185 feet x  3 laps goal with forehead  probe 12/06/2021  12/07/2022  07/12/2023  10/31/2023   O2 used ra ra ra ra  Number laps completed 3 3 Sit stand x 10-15 Sitt and stand x 15  Comments about pace avg     Resting Pulse Ox/HR 97% and 91/min 100% and HR 72 93% and HR 79 97% and HR 70  Final Pulse Ox/HR 93% and 112/min 94% and HR 101 90% and HR 101 93% and HR 125  Desaturated </= 88% no no  no  Desaturated <= 3% points yes Yes, 6  yes  Got Tachycardic >/= 90/min yes yes  yes  Symptoms at end of test No complaints N compaints  No complaint  Miscellaneous comments x   x     CT Chest data from date: Jan 2024  - personally visualized and independently interpreted : yes - my findings are: as below Narrative & Impression  CLINICAL DATA:  83 year old female with history of chronic interstitial lung disease.   EXAM: CT CHEST WITHOUT CONTRAST   TECHNIQUE: Multidetector CT imaging of the chest was performed following the standard protocol without intravenous contrast. High resolution imaging of the lungs, as well as inspiratory and expiratory imaging, was performed.   RADIATION DOSE REDUCTION: This exam was performed according to the departmental dose-optimization program which includes automated exposure control, adjustment of the mA and/or kV according to patient size and/or use of iterative reconstruction technique.   COMPARISON:  High-resolution chest CT 02/25/2022.   FINDINGS: Cardiovascular: Heart size is normal. There is no significant pericardial fluid, thickening or pericardial calcification. There is aortic atherosclerosis, as well as atherosclerosis of the great vessels of the mediastinum and the coronary arteries, including calcified atherosclerotic plaque in the left main, left anterior descending, left circumflex and right coronary arteries. Calcifications of the aortic valve and mitral annulus.   Mediastinum/Nodes: No pathologically enlarged mediastinal or hilar lymph nodes. Please note that accurate  exclusion of hilar adenopathy is limited on noncontrast CT scans. Esophagus is unremarkable in appearance. No axillary lymphadenopathy.   Lungs/Pleura: High-resolution images again demonstrate widespread but patchy areas of ground-glass attenuation, septal thickening, thickening of the peribronchovascular interstitium, cylindrical bronchiectasis and peripheral bronchiolectasis most evident throughout the mid to lower lungs bilaterally. These findings appear mildly progressive compared to  the prior examination. No frank honeycombing confidently identified at this time. Inspiratory and expiratory imaging demonstrates some mild air trapping indicative of mild small airways disease. No acute consolidative airspace disease. No pleural effusions. In the superior segment of the right lower lobe (axial image 68 of series 4) there is a 10 x 8 mm pulmonary nodule which is stable compared to prior examinations. No other larger more suspicious appearing pulmonary nodules or masses are noted. Moderate centrilobular and paraseptal emphysema.   Upper Abdomen: Aortic atherosclerosis.   Musculoskeletal: Multiple old healed bilateral rib fractures. There are no aggressive appearing lytic or blastic lesions noted in the visualized portions of the skeleton.   IMPRESSION: 1. 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 Mild progression of disease compared to the prior study. 2. There is also moderate centrilobular and paraseptal emphysema. 3. Aortic atherosclerosis, in addition to left main and three-vessel coronary artery disease. 4. There are calcifications of the aortic valve and mitral annulus. Echocardiographic correlation for evaluation of potential valvular dysfunction may be warranted if clinically indicated.   Aortic Atherosclerosis (ICD10-I70.0) and Emphysema  (ICD10-J43.9).     Electronically Signed   By: Trudie Reed M.D.   On: 10/06/2023 09:42      PFT     Latest Ref Rng & Units 12/04/2022    8:48 AM 09/22/2022   11:43 AM 08/01/2022    9:17 AM 05/18/2022    3:08 PM 04/06/2020   10:48 AM 07/09/2018   12:49 PM 01/01/2018    8:51 AM  PFT Results  FVC-Pre L 2.47  2.40  2.44  2.17  2.38  2.33  2.38   FVC-Predicted Pre % 90  87  87  78  83  79  81   Pre FEV1/FVC % % 62  59  57  65  65  59  68   FEV1-Pre L 1.53  1.41  1.40  1.41  1.55  1.38  1.63   FEV1-Predicted Pre % 75  69  67  67  72  62  74   DLCO uncorrected ml/min/mmHg 11.27  10.61  10.86  10.58  13.11  12.18  20.54   DLCO UNC% % 57  54  55  54  66  47  80   DLCO corrected ml/min/mmHg 11.27  10.61  10.86  10.58  13.11     DLCO COR %Predicted % 57  54  55  54  66     DLVA Predicted % 65  66  64  63  73  57  59        LAB RESULTS last 96 hours No results found.       has a past medical history of COPD (chronic obstructive pulmonary disease) (HCC), Coronary artery disease, Diverticulosis, HTN (hypertension), and Lung nodule.   reports that she quit smoking about 11 years ago. Her smoking use included cigarettes. She started smoking about 62 years ago. She has a 37.5 pack-year smoking history. She has never used smokeless tobacco.  Past Surgical History:  Procedure Laterality Date   CARDIAC CATHETERIZATION     CORONARY ANGIOPLASTY     FRACTURE SURGERY     INTRAMEDULLARY (IM) NAIL INTERTROCHANTERIC Right 02/26/2022   Procedure: INTRAMEDULLARY (IM) NAIL INTERTROCHANTRIC;  Surgeon: Bjorn Pippin, MD;  Location: [facility] OR;  Service: Orthopedics;  Laterality: Right;   TONSILLECTOMY AND ADENOIDECTOMY  Allergies  Allergen Reactions   Digoxin And Related Other (See Comments)    Red palms    Immunization History  Administered Date(s) Administered   Fluad Quad(high Dose 65+) 06/20/2022   Influenza Split 06/04/2013, 05/05/2014   Influenza Whole 05/28/2012   Influenza,  High Dose Seasonal PF 06/05/2016, 06/04/2017, 05/20/2018, 05/06/2019   Influenza, Quadrivalent, Recombinant, Inj, Pf 05/25/2021   Influenza,inj,Quad PF,6+ Mos 06/05/2015   Influenza,trivalent, recombinat, inj, PF 06/19/2023   Moderna Sars-Covid-2 Vaccination 11/10/2019, 12/08/2019, 06/30/2020, 01/11/2021, 08/02/2021   PNEUMOCOCCAL CONJUGATE-20 11/17/2020   Pfizer(Comirnaty)Fall Seasonal Vaccine 12 years and older 06/19/2023   Pneumococcal Conjugate-13 01/26/2015   Pneumococcal Polysaccharide-23 09/04/2005, 09/24/2012   Tdap 03/20/2012    Family History  Problem Relation Age of Onset   Heart attack Father    Kidney disease Father    COPD Mother    Lung cancer Brother 68   Pulmonary fibrosis Son        had lung transplant     Current Outpatient Medications:    apixaban (ELIQUIS) 5 MG TABS tablet, Take 1 tablet (5 mg total) by mouth 2 (two) times daily., Disp: 180 tablet, Rfl: 0   Ascorbic Acid (VITAMIN C) 1000 MG tablet, Take 1,000 mg by mouth daily., Disp: , Rfl:    atorvastatin (LIPITOR) 10 MG tablet, TAKE 1 TABLET BY MOUTH  EVERY EVENING AFTER DINNER, Disp: 30 tablet, Rfl: 1   b complex vitamins tablet, Take 2 tablets by mouth daily., Disp: , Rfl:    bisoprolol (ZEBETA) 5 MG tablet, Take 1.5 tablets (7.5 mg total) by mouth daily., Disp: 135 tablet, Rfl: 0   calcium-vitamin D 250-100 MG-UNIT per tablet, Take 1 tablet by mouth daily. , Disp: , Rfl:    docusate sodium (COLACE) 100 MG capsule, Take 1 capsule (100 mg total) by mouth 2 (two) times daily., Disp: 10 capsule, Rfl: 0   doxycycline (VIBRA-TABS) 100 MG tablet, Take 1 tablet (100 mg total) by mouth 2 (two) times daily., Disp: 5 tablet, Rfl: 1   fish oil-omega-3 fatty acids 1000 MG capsule, Take 1,000 mg by mouth daily., Disp: , Rfl:    fluticasone furoate-vilanterol (BREO ELLIPTA) 100-25 MCG/ACT AEPB, inhale ONE PUFF into THE lungs EVERY DAY, Disp: 60 each, Rfl: 11   glucosamine-chondroitin 500-400 MG tablet, Take 1 tablet by  mouth every morning., Disp: , Rfl:    LUTEIN PO, Take 1 tablet by mouth daily., Disp: , Rfl:    magnesium 30 MG tablet, Take 30 mg by mouth daily in the afternoon., Disp: , Rfl:    Melatonin 10 MG TABS, Take 10 mg by mouth at bedtime., Disp: , Rfl:    omeprazole (PRILOSEC) 20 MG capsule, Take 20 mg by mouth daily., Disp: , Rfl:    OVER THE COUNTER MEDICATION, Take 2 mg by mouth daily in the afternoon. Biotin 2 mg, Disp: , Rfl:    OVER THE COUNTER MEDICATION, Place 1 drop into both eyes daily as needed (dry eye). OTC eye drop, Disp: , Rfl:    PAPAYA PO, Take 3 tablets by mouth daily as needed (heartburn)., Disp: , Rfl:    predniSONE (DELTASONE) 10 MG tablet, Please take prednisone 40 mg x1 day, then 30 mg x1 day, then 20 mg x1 day, then 10 mg x1 day, and then 5 mg x1 day and stop, Disp: 10 tablet, Rfl: 1   Red Yeast Rice 600 MG TABS, Take 1,200 mg by mouth daily., Disp: , Rfl:    selenium 50 MCG TABS,  Take 50 mcg by mouth daily., Disp: , Rfl:    tiotropium (SPIRIVA) 18 MCG inhalation capsule, Place 18 mcg into inhaler and inhale daily., Disp: , Rfl:    vitamin B-12 (CYANOCOBALAMIN) 1000 MCG tablet, Take 1,000 mcg by mouth daily., Disp: , Rfl:    vitamin E 200 UNIT capsule, Take 200 Units by mouth daily., Disp: , Rfl:       Objective:   Vitals:   10/31/23 1119 10/31/23 1120  Pulse: 87   SpO2: 96%   Weight:  146 lb (66.2 kg)  Height:  5\' 5"  (1.651 m)    Estimated body mass index is 24.3 kg/m as calculated from the following:   Height as of this encounter: 5\' 5"  (1.651 m).   Weight as of this encounter: 146 lb (66.2 kg).  @WEIGHTCHANGE @  [employer]   10/31/23 1120  Weight: 146 lb (66.2 kg)     Physical Exam   General: No distress. Looks well O2 at rest: no Cane present: no Sitting in wheel chair: no Frail: no Obese: no Neuro: Alert and Oriented x 3. GCS 15. Speech normal Psych: Pleasant Resp:  Barrel Chest - no.  Wheeze - no, Crackles - YES, No overt respiratory  distress CVS: Normal heart sounds. Murmurs - no Ext: Stigmata of Connective Tissue Disease - no HEENT: Normal upper airway. PEERL +. No post nasal drip        Assessment:       ICD-10-CM   1. IPF (idiopathic pulmonary fibrosis) (HCC)  C16.606 Pulmonary function test    Hepatic function panel    Hepatic function panel    2. 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 Encounter for therapeutic drug monitoring  Z51.81 Hepatic function panel    Hepatic function panel    5. High risk medication use  Z79.899 Hepatic function panel    Hepatic function panel         Plan:     Patient Instructions     ICD-10-CM   1. IPF (idiopathic pulmonary fibrosis) (HCC)  T01.601 Pulmonary function test    2. 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  But ever so slightly since  2015 it is slowly gotten worse.  Continuied worsening JAn 2025 CT chest At conference the concern is that this Is IPF   -Did  not qualify for clinical trial on account of combined emphysema  -  Glad yo uare now intersted in approved anti-fibrotic - wil start with low dose protocol   Plan -- start esbriet low dose protocl - (max 2 pills three times daily) - check LFT   RLL lung ndoule - dec 2023 =- sstable x 10 days  - stable x 10 years  Plan  - no  further followup   Emphysema   Plan  - for futre flare up prevention  - Please take prednisone 40 mg x1 day, then 30 mg x1 day, then 20 mg x1 day, then 10 mg x1 day, and then 5 mg x1 day and stop  -= Take doxycycline 100mg  po twice daily x 5 days; take after meals and avoid sunlight    Plan -Return to see APP in 6-8 weeks to monitor esbreit uptake  -Symptom questionnaire and walking desaturation test at follow-up   FOLLOWUP Return in about 6 years (around 10/30/2029) for with any of the APPS for esbeit uptake, Face to Face OR Video Visit.   HIGh Complexity  OFFICE   2021 E/M guidelines, first released in 2021, with minor revisions added in 2023. Must meet the requirements for 2 out of 3 dimensions to qualify.    Number and complexity of problems addressed Amount and/or complexity of data reviewed Risk of complications and/or morbidity  Severe exacerbation of chronic illness  Acute or chronic illnesses that may pose a threat to life or bodily function, e.g., multiple trauma, acute MI, pulmonary embolus, severe respiratory distress, progressive rheumatoid arthritis, psychiatric illness with potential threat to self or others, peritonitis, acute renal failure, abrupt change in neurological status Must meet the requirements for 2 of 3 of the categories)  Category 1: Tests and documents, historian  Any combination of 3 of the following:  Assessment requiring an independent historian  Review of prior external note(s) from  each unique source  Review of results of each unique test  Ordering of each unique test    Category 2: Interpretation of tests    Independent interpretation of a test performed by another physician/other qualified health care professional (not separately reported)  Category 3: Discuss management/tests  Discussion of management or test interpretation with external physician/other qualified health care professional/appropriate source (not separately reported) - hrmacy  HIGH risk of morbidity from additional diagnostic testing or treatment Examples only:  Drug therapy requiring intensive monitoring for toxicity  Decision for elective major surgery with identified pateint or procedure risk factors  Decision regarding hospitalization or escalation of level of care  Decision for DNR or to de-escalate care   Parenteral controlled  substances            SIGNATURE    Dr. Kalman Shan, M.D., F.C.C.P,  Pulmonary and Critical Care Medicine Staff Physician, [facility] Director - Interstitial Lung Disease  Program  Pulmonary Fibrosis [facility] at [facility] [place], [place], [address]  Pager: [phone], If no answer or between  15:00h - 7:00h: call [phone] Telephone: [phone]  12:11 PM 10/31/2023

## 2023-10-31 NOTE — Patient Instructions (Addendum)
 ICD-10-CM   1. IPF (idiopathic pulmonary fibrosis) (HCC)  W09.811 Pulmonary function test    2. 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  But ever so slightly since 2015 it is slowly gotten worse.  Continuied worsening JAn 2025 CT chest At conference the concern is that this Is IPF   -Did not qualify for clinical trial on account of combined emphysema  -  Glad yo uare now intersted in approved anti-fibrotic - wil start with low dose protocol   Plan -- start esbriet low dose protocl - (max 2 pills three times daily) - check LFT   RLL lung ndoule - dec 2023 =- sstable x 10 days  - stable x 10 years  Plan  - no  further followup   Emphysema   Plan  - for futre flare up prevention  - Please take prednisone 40 mg x1 day, then 30 mg x1 day, then 20 mg x1 day, then 10 mg x1 day, and then 5 mg x1 day and stop  -= Take doxycycline 100mg  po twice daily x 5 days; take after meals and avoid sunlight    Plan -Return to see APP in 6-8 weeks to monitor esbreit uptake  -Symptom questionnaire and walking desaturation test  at follow-up

## 2023-10-31 NOTE — Telephone Encounter (Signed)
 Advanced Heart Failure Patient Advocate Encounter  Patient was seen in clinic and started on Jardiance. Called pharmacy, 30 day co-pay $47.  Archer Asa, CPhT

## 2023-10-31 NOTE — Progress Notes (Signed)
 HEART & VASCULAR TRANSITION OF CARE CONSULT NOTE     Referring Physician: Dr. Alanda Slim PCP: Dr. [name] Cardiologist: Dr. Jacinto Halim  Chief Complaint:   HPI: Referred to clinic by Dr. Alanda Slim with [facility] for heart failure consultation.   Erin Li is a 83 y.o. female with prior history of CAD s/p angioplasty to LAD in remote past (she brought diagram from procedure years ago), COPD/IPF, prior tobacco use.   Presented to the ED earlier this month with shortness of breath and cough and found to have new CHF and Afib with RVR. She was diuresed. GDMT limited by low BP. Transiently on amiodarone but stopped d/t history of pulmonary disease. She converted to SR but later had recurrence. EP consulted, Afib felt to be driven by significant pulmonary disease. Antiarrhythmic options limited. Bisoprolol continued.  Echo with EF 40-45%, mildly reduced RV, moderate MR. 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  Here today for post-hospital CHF follow-up.  She is accompanied by her husband who assists with the history. Her weight has been between 140-142 lb since follow-up. Shortness of breath significantly improved. No orthopnea, PND or lower extremity edema.  She has kardia mobile at home, she checks her rhythm/pulse daily and states it is almost always less than 100, typically in 90s. She is watching salt and fluid intake.    Remote tobacco use, drinks a couple of glasses of wine a week.  Past Medical History:  Diagnosis Date   COPD (chronic obstructive pulmonary disease) (HCC)    Coronary artery disease    Diverticulosis    HTN (hypertension)    Lung nodule     Current Outpatient Medications  Medication Sig Dispense Refill   apixaban (ELIQUIS) 5 MG TABS tablet Take 1 tablet (5 mg total) by mouth 2 (two) times daily. 180 tablet 0   Ascorbic Acid (VITAMIN C) 1000 MG tablet Take  1,000 mg by mouth daily.     bisoprolol (ZEBETA) 5 MG tablet Take 1.5 tablets (7.5 mg total) by mouth daily. 135 tablet 0   calcium-vitamin D 250-100 MG-UNIT per tablet Take 1 tablet by mouth daily.      docusate sodium (COLACE) 100 MG capsule Take 1 capsule (100 mg total) by mouth 2 (two) times daily. 10 capsule 0   doxycycline (VIBRA-TABS) 100 MG tablet Take 1 tablet (100 mg total) by mouth 2 (two) times daily. 5 tablet 1   fish oil-omega-3 fatty acids 1000 MG capsule Take 1,000 mg by mouth daily.     fluticasone furoate-vilanterol (BREO ELLIPTA) 100-25 MCG/ACT AEPB inhale ONE PUFF into THE lungs EVERY DAY 60 each 11   furosemide (LASIX) 40 MG tablet Take 1 tablet (40 mg total) by mouth daily as needed. 30 tablet 0   glucosamine-chondroitin 500-400 MG tablet Take 1 tablet by mouth every morning.     LUTEIN PO Take 1 tablet by mouth daily.     magnesium 30 MG tablet Take 30 mg by mouth daily in the afternoon.     Melatonin 10 MG TABS Take 10 mg by mouth at bedtime.     omeprazole (PRILOSEC) 20 MG capsule Take 20 mg by mouth daily.     OVER THE COUNTER MEDICATION Take 2 mg by mouth daily in the afternoon. Biotin 2 mg     OVER THE COUNTER MEDICATION Place 1 drop into both eyes daily as needed (dry  eye). OTC eye drop     PAPAYA PO Take 3 tablets by mouth daily as needed (heartburn).     potassium chloride SA (KLOR-CON M) 20 MEQ tablet Take 2 tablets (40 mEq total) by mouth daily as needed (when you take Furosemide). 60 tablet 0   Red Yeast Rice 600 MG TABS Take 1,200 mg by mouth daily.     selenium 50 MCG TABS Take 50 mcg by mouth daily.     tiotropium (SPIRIVA) 18 MCG inhalation capsule Place 18 mcg into inhaler and inhale daily.     vitamin B-12 (CYANOCOBALAMIN) 1000 MCG tablet Take 1,000 mcg by mouth daily.     vitamin E 200 UNIT capsule Take 200 Units by mouth daily.     atorvastatin (LIPITOR) 10 MG tablet TAKE 1 TABLET BY MOUTH  EVERY EVENING AFTER DINNER 30 tablet 1   b complex vitamins  tablet Take 2 tablets by mouth daily.     No current facility-administered medications for this encounter.    Allergies  Allergen Reactions   Digoxin And Related Other (See Comments)    Red palms      Social History   Socioeconomic History   Marital status: Married    Spouse name: Kerry Hough   Number of children: 4   Years of education: Not on file   Highest education level: High school graduate  Occupational History   Occupation: Retired  Tobacco Use   Smoking status: Former    Current packs/day: 0.00    Average packs/day: 0.8 packs/day for 50.0 years (37.5 ttl pk-yrs)    Types: Cigarettes    Start date: 11/10/1961    Quit date: 11/11/2011    Years since quitting: 11.9   Smokeless tobacco: Never   Tobacco comments:    Encouraged to remain smoke free  Vaping Use   Vaping status: Never Used  Substance and Sexual Activity   Alcohol use: Yes    Alcohol/week: 5.0 standard drinks of alcohol    Types: 5 Glasses of wine per week    Comment: weekends   Drug use: Not on file   Sexual activity: Not on file  Other Topics Concern   Not on file  Social History Narrative   Not on file   Social Drivers of Health   Financial Resource Strain: Low Risk  (10/22/2023)   Overall Financial Resource Strain (CARDIA)    Difficulty of Paying Living Expenses: Not very hard  Food Insecurity: No Food Insecurity (10/24/2023)   Hunger Vital Sign    Worried About Running Out of Food in the Last Year: Never true    Ran Out of Food in the Last Year: Never true  Transportation Needs: No Transportation Needs (10/24/2023)   PRAPARE - Administrator, Civil Service (Medical): No    Lack of Transportation (Non-Medical): No  Physical Activity: Not on file  Stress: Not on file  Social Connections: Socially Integrated (10/17/2023)   Social Connection and Isolation Panel [NHANES]    Frequency of Communication with Friends and Family: More than three times a week    Frequency of Social  Gatherings with Friends and Family: More than three times a week    Attends Religious Services: More than 4 times per year    Active Member of [employer] or Organizations: Yes    Attends Banker Meetings: Patient declined    Marital Status: Married  Catering manager Violence: Not At Risk (10/24/2023)   Humiliation, Afraid, Rape, and Kick  questionnaire    Fear of Current or Ex-Partner: No    Emotionally Abused: No    Physically Abused: No    Sexually Abused: No      Family History  Problem Relation Age of Onset   Heart attack Father    Kidney disease Father    COPD Mother    Lung cancer Brother 108   Pulmonary fibrosis Son        had lung transplant    Vitals:   10/31/23 1407  BP: 98/64  Pulse: 92  SpO2: 91%  Weight: 66.3 kg (146 lb 3.2 oz)    PHYSICAL EXAM: General:  Appears younger than stated age. Neck: no JVD.  Cor: Irregularly irregular rhythm. No rubs, gallops or murmurs. Lungs: scattered crackles Abdomen: soft, nontender, nondistended.  Extremities: no edema Neuro: alert & oriented x 3. Affect pleasant.   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 Rate now better controlled with bisoprolol. Ischemic also a consideration with known history of CAD and coronary calcifications on CT in January.  -Would repeat echo in next couple of months after restoration of sinus rhythm and optimization of GDMT. -NYHA II currently. Not currently on diuretic. She was given a prescription for lasix 40 mg PRN and potassium 40 mEq PRN. -Start Robinson or Farxiga, choice depending on copay -Continue bisoprolol 7.5 mg daily -GDMT limited by soft BP  -BMET and BNP today  Atrial fibrillation -Diagnosed earlier this month. Pulmonary disease likely contributing. -Rate 110s elevated in 110s on ECG today. However, ventricular rate consistently less  than 100 on home monitoring with her KardiaMobile app. -Continue bisoprolol 7.5 mg daily -Continue Eliquis 5 mg BID -Referred to Afib clinic. Seen by EP during recent admit. Only potential option for AAD would be Amiodarone, but thyroid/lung function would need to be closely followed  CAD -Remote history of PCI to LAD  -Multivessel coronary calcifications on recent CT -No aspirin with anticoagulation. On atorvastatin. -Denies angina.  COPD IPF -Planning to start esbriet per Pulmonary    Referred to HFSW (PCP, Medications, Transportation, ETOH Abuse, Drug Abuse, Insurance, Financial ): No Refer to Pharmacy: Yes, cost of Jardiance vs Farxiga Refer to Home Health: No Refer to Advanced Heart Failure Clinic: No Refer to General Cardiology: Yes, referred for follow-up  Follow up  PRN, referred for follow-up with Afib clinic and Cardiology

## 2023-11-01 ENCOUNTER — Telehealth: Payer: Self-pay | Admitting: Pharmacist

## 2023-11-01 DIAGNOSIS — Z5181 Encounter for therapeutic drug level monitoring: Secondary | ICD-10-CM

## 2023-11-01 DIAGNOSIS — J84112 Idiopathic pulmonary fibrosis: Secondary | ICD-10-CM

## 2023-11-01 NOTE — Telephone Encounter (Signed)
 Received new start paperwork for pirfenidone  Will be low dose for maintenance (534mg  three times daily)  Take 1 tab three times daily for 7 days, then 2 tabs three times daily thereafter  Submitted a Prior Authorization request to [facility] for PIRFENIDONE via CoverMyMeds. Will update once we receive a response.  Key: [id number]   Chesley Mires, PharmD, MPH, BCPS, CPP Clinical Pharmacist (Rheumatology and Pulmonology)

## 2023-11-01 NOTE — Telephone Encounter (Signed)
 Received new start paperwork. Pharmacy team will start benefits investigation

## 2023-11-02 ENCOUNTER — Other Ambulatory Visit (HOSPITAL_COMMUNITY): Payer: Self-pay

## 2023-11-02 NOTE — Telephone Encounter (Signed)
 Received notification from [facility] regarding a prior authorization for PIRFENIDONE. Authorization has been APPROVED from 11/01/2023 to 09/03/2024. Approval letter sent to scan center.  Per test claim, copay for 30 days supply is $113.63  Patient can fill through [facility]: [phone]   Authorization # [id number]  Contacted pt and discussed, she is comfortable with this copay. Will route to [facility] to begin new start process.

## 2023-11-05 NOTE — Telephone Encounter (Signed)
 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 For patient inquiries, contact PAF at [phone].   Patient needs to repeat LFTs before starting treatment     Latest Ref Rng & Units 10/31/2023   12:11 PM 10/23/2023    2:43 AM 10/22/2023    2:21 AM  Hepatic Function  Total Protein 6.0 - 8.3 g/dL 7.2     Albumin 3.5 - 5.2 g/dL 3.8  3.0  3.0   AST 0 - 37 U/L 65     ALT 0 - 35 U/L 100     Alk Phosphatase 39 - 117 U/L 81     Total Bilirubin 0.2 - 1.2 mg/dL 0.5     Bilirubin, Direct 0.0 - 0.3 mg/dL 0.1      ATC patient to discuss. Unable to reach. Left VM with my callback number  Chesley Mires, PharmD, MPH, BCPS, CPP Clinical Pharmacist (Rheumatology and Pulmonology)

## 2023-11-06 ENCOUNTER — Ambulatory Visit (HOSPITAL_COMMUNITY)
Admission: RE | Admit: 2023-11-06 | Discharge: 2023-11-06 | Disposition: A | Discharge status: Home / Self Care | Payer: Medicare Other | Source: Ambulatory Visit | Attending: Internal Medicine | Admitting: Internal Medicine

## 2023-11-06 VITALS — BP 108/66 | HR 95 | Ht 65.0 in | Wt 146.2 lb

## 2023-11-06 DIAGNOSIS — I2510 Atherosclerotic heart disease of native coronary artery without angina pectoris: Secondary | ICD-10-CM | POA: Diagnosis not present

## 2023-11-06 DIAGNOSIS — I4819 Other persistent atrial fibrillation: Secondary | ICD-10-CM | POA: Diagnosis not present

## 2023-11-06 DIAGNOSIS — J84112 Idiopathic pulmonary fibrosis: Secondary | ICD-10-CM | POA: Diagnosis not present

## 2023-11-06 DIAGNOSIS — E785 Hyperlipidemia, unspecified: Secondary | ICD-10-CM | POA: Diagnosis not present

## 2023-11-06 DIAGNOSIS — Z7901 Long term (current) use of anticoagulants: Secondary | ICD-10-CM | POA: Insufficient documentation

## 2023-11-06 DIAGNOSIS — I110 Hypertensive heart disease with heart failure: Secondary | ICD-10-CM | POA: Insufficient documentation

## 2023-11-06 DIAGNOSIS — I4891 Unspecified atrial fibrillation: Secondary | ICD-10-CM | POA: Diagnosis not present

## 2023-11-06 DIAGNOSIS — G720 Drug-induced myopathy: Secondary | ICD-10-CM | POA: Diagnosis not present

## 2023-11-06 DIAGNOSIS — D6869 Other thrombophilia: Secondary | ICD-10-CM | POA: Insufficient documentation

## 2023-11-06 DIAGNOSIS — I509 Heart failure, unspecified: Secondary | ICD-10-CM | POA: Diagnosis not present

## 2023-11-06 DIAGNOSIS — J449 Chronic obstructive pulmonary disease, unspecified: Secondary | ICD-10-CM | POA: Insufficient documentation

## 2023-11-06 DIAGNOSIS — I5022 Chronic systolic (congestive) heart failure: Secondary | ICD-10-CM | POA: Insufficient documentation

## 2023-11-06 DIAGNOSIS — Z09 Encounter for follow-up examination after completed treatment for conditions other than malignant neoplasm: Secondary | ICD-10-CM | POA: Diagnosis not present

## 2023-11-06 NOTE — Progress Notes (Signed)
 Primary Care Physician: [name], DO Primary Cardiologist: Yates Decamp, MD Electrophysiologist: Dr. Lalla Brothers    Referring Physician: Dr. Candis Musa is a 83 y.o. female with a history of COPD, systolic HF, CAD, HTN, HLD, and atrial fibrillation who presents for consultation in the [facility]. Recent hospital admission 2/12-18/25 for SOB and edema found in new Afib with RVR and new CHF exacerbation with possible COPD exacerbation. Patient was started on IV amiodarone but discontinued due to underlying COPD/ILD. Bisoprolol was increased to 7.5 mg daily for rate control. Evaluated by Dr. Lalla Brothers in hospital and patient noted overall to have very limited options for rhythm control. Patient is on Eliquis 5 mg BID for a CHADS2VASC score of 6.  On evaluation today, she is currently in rate controlled Afib. Patient notes intermittent SOB and fatigue which has been going on before hospital admission. No missed doses of Eliquis.    Today, she denies symptoms of palpitations, chest pain, shortness of breath, orthopnea, PND, lower extremity edema, dizziness, presyncope, syncope, snoring, daytime somnolence, bleeding, or neurologic sequela. The patient is tolerating medications without difficulties and is otherwise without complaint today.    she has a BMI of Body mass index is 24.33 kg/m.[name] Filed Weights   11/06/23 1350  Weight: 66.3 kg    Current Outpatient Medications  Medication Sig Dispense Refill   apixaban (ELIQUIS) 5 MG TABS tablet Take 1 tablet (5 mg total) by mouth 2 (two) times daily. 180 tablet 0   Ascorbic Acid (VITAMIN C) 1000 MG tablet Take 1,000 mg by mouth daily.     b complex vitamins tablet Take 2 tablets by mouth daily.     bisoprolol (ZEBETA) 5 MG tablet Take 1.5 tablets (7.5 mg total) by mouth daily. 135 tablet 0   calcium-vitamin D 250-100 MG-UNIT per tablet Take 1 tablet by mouth daily.      docusate sodium (COLACE) 100 MG  capsule Take 1 capsule (100 mg total) by mouth 2 (two) times daily. 10 capsule 0   fish oil-omega-3 fatty acids 1000 MG capsule Take 1,000 mg by mouth daily.     fluticasone furoate-vilanterol (BREO ELLIPTA) 100-25 MCG/ACT AEPB inhale ONE PUFF into THE lungs EVERY DAY 60 each 11   furosemide (LASIX) 40 MG tablet Take 1 tablet (40 mg total) by mouth daily as needed. 30 tablet 0   glucosamine-chondroitin 500-400 MG tablet Take 1 tablet by mouth every morning.     JARDIANCE 10 MG TABS tablet Take 1 tablet (10 mg total) by mouth daily before breakfast. 30 tablet 11   LUTEIN PO Take 1 tablet by mouth daily.     magnesium 30 MG tablet Take 30 mg by mouth daily in the afternoon.     Melatonin 10 MG TABS Take 10 mg by mouth at bedtime.     omeprazole (PRILOSEC) 20 MG capsule Take 20 mg by mouth daily.     OVER THE COUNTER MEDICATION Take 2 mg by mouth daily in the afternoon. Biotin 2 mg     OVER THE COUNTER MEDICATION Place 1 drop into both eyes daily as needed (dry eye). OTC eye drop     PAPAYA PO Take 3 tablets by mouth daily as needed (heartburn).     potassium chloride SA (KLOR-CON M) 20 MEQ tablet Take 2 tablets (40 mEq total) by mouth daily as needed (when you take Furosemide). 60 tablet 0   Red Yeast Rice 600 MG  TABS Take 1,200 mg by mouth daily.     selenium 50 MCG TABS Take 50 mcg by mouth daily.     tiotropium (SPIRIVA) 18 MCG inhalation capsule Place 18 mcg into inhaler and inhale daily.     vitamin B-12 (CYANOCOBALAMIN) 1000 MCG tablet Take 1,000 mcg by mouth daily.     vitamin E 200 UNIT capsule Take 200 Units by mouth daily.     guaiFENesin (MUCINEX) 600 MG 12 hr tablet Take 600 mg by mouth 2 (two) times daily.     No current facility-administered medications for this encounter.    Atrial Fibrillation Management history:  Previous antiarrhythmic drugs: amiodarone Previous cardioversions: none Previous ablations: none Anticoagulation history: Eliquis 5 mg BID   ROS- All systems  are reviewed and negative except as per the HPI above.  Physical Exam: BP 108/66   Pulse 95   Ht 5\' 5"  (1.651 m)   Wt 66.3 kg   LMP 09/04/1988 (Approximate)   BMI 24.33 kg/m   GEN: Well nourished, well developed in no acute distress NECK: No JVD; No carotid bruits CARDIAC: Irregularly irregular rate and rhythm, no murmurs, rubs, gallops RESPIRATORY:  Clear to auscultation without rales, wheezing or rhonchi  ABDOMEN: Soft, non-tender, non-distended EXTREMITIES:  No edema; No deformity   EKG today demonstrates  Vent. rate 95 BPM PR interval * ms QRS duration 70 ms QT/QTcB 366/459 ms P-R-T axes * 102 -2 Atrial fibrillation with premature ventricular or aberrantly conducted complexes Rightward axis Low voltage QRS Abnormal QRS-T angle, consider primary T wave abnormality Abnormal ECG When compared with ECG of 31-Oct-2023 14:08, PREVIOUS ECG IS PRESENT  Echo 10/18/23 demonstrated  1. Left ventricular ejection fraction, by estimation, is 40 to 45%. The  left ventricle has mildly decreased function. The left ventricle  demonstrates global hypokinesis. Left ventricular diastolic parameters are  consistent with Grade II diastolic  dysfunction (pseudonormalization).   2. Right ventricular systolic function is mildly reduced. The right  ventricular size is mildly enlarged. There is moderately elevated  pulmonary artery systolic pressure. The estimated right ventricular  systolic pressure is 59.3 mmHg.   3. Left atrial size was moderately dilated.   4. Right atrial size was moderately dilated.   5. The mitral valve is normal in structure. 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 No evidence of mitral  stenosis. Moderate mitral annular calcification.   6. Tricuspid valve regurgitation is moderate.   7. The aortic valve is tricuspid. There is mild calcification of the  aortic valve. Aortic valve regurgitation is not visualized. No aortic  stenosis is  present.   8. The inferior vena cava is normal in size with <50% respiratory  variability, suggesting right atrial pressure of 8 mmHg.   ASSESSMENT & PLAN CHA2DS2-VASc Score = 5  The patient's score is based upon: CHF History: 0 HTN History: 1 Diabetes History: 0 Stroke History: 0 Vascular Disease History: 1 Age Score: 2 Gender Score: 1      Chads 6 with CHF   ASSESSMENT AND PLAN: Persistent Atrial Fibrillation (ICD10:  I48.19) The patient's CHA2DS2-VASc score is 5, indicating a 7.2% annual risk of stroke.    She is currently in rate controlled Afib. She is not a candidate for Tikosyn given prolonged Qtc interval at baseline. She has reduced EF which is prohibitive of Multaq. Class IC drugs are not indicated due to CAD history. Amiodarone would be the only option for rhythm control however she has history of COPD/ILD which  makes amiodarone less favorable and probably not indicated. Continue rate control for now with bisoprolol 7.5 mg daily. If patient decides that she wishes to re consider AAD therapy (amiodarone), this would have to be cleared by her pulmonologist and EP.   Secondary Hypercoagulable State (ICD10:  D68.69) The patient is at significant risk for stroke/thromboembolism based upon her CHA2DS2-VASc Score of 5.  Continue Apixaban (Eliquis).  Continue Eliquis 5 mg BID without interruption.      Follow up Afib clinic prn.   Lake Bells, PA-C  Afib Clinic [facility] [address] [place], [place] [address] [phone]

## 2023-11-06 NOTE — Patient Instructions (Addendum)
 Continue bisoprolol 7.5 mg daily

## 2023-11-06 NOTE — Telephone Encounter (Signed)
 Returned call to patient about the previous note.  Patient advised that we will repeat LFTs. She had bloodwork completed this morning PCP and will call to ask if LFTs were included  She denies increases Tylenol use or increased alcohol intake  Will f/u with patient in a few days  Chesley Mires, PharmD, MPH, BCPS, CPP Clinical Pharmacist (Rheumatology and Pulmonology)

## 2023-11-08 LAB — LAB REPORT - SCANNED: EGFR: 60

## 2023-11-14 ENCOUNTER — Ambulatory Visit: Attending: Cardiology | Admitting: Cardiology

## 2023-11-14 ENCOUNTER — Encounter: Payer: Self-pay | Admitting: Cardiology

## 2023-11-14 ENCOUNTER — Telehealth: Payer: Self-pay | Admitting: Cardiology

## 2023-11-14 VITALS — BP 110/74 | HR 52 | Resp 16 | Ht 65.0 in | Wt 147.6 lb

## 2023-11-14 DIAGNOSIS — Z01812 Encounter for preprocedural laboratory examination: Secondary | ICD-10-CM | POA: Diagnosis not present

## 2023-11-14 DIAGNOSIS — I4819 Other persistent atrial fibrillation: Secondary | ICD-10-CM | POA: Diagnosis not present

## 2023-11-14 DIAGNOSIS — J432 Centrilobular emphysema: Secondary | ICD-10-CM

## 2023-11-14 DIAGNOSIS — I2510 Atherosclerotic heart disease of native coronary artery without angina pectoris: Secondary | ICD-10-CM | POA: Diagnosis not present

## 2023-11-14 DIAGNOSIS — I739 Peripheral vascular disease, unspecified: Secondary | ICD-10-CM | POA: Diagnosis not present

## 2023-11-14 DIAGNOSIS — I5022 Chronic systolic (congestive) heart failure: Secondary | ICD-10-CM

## 2023-11-14 LAB — CBC

## 2023-11-14 MED ORDER — SOTALOL HCL 80 MG PO TABS: 80.0000 mg | ORAL_TABLET | Freq: Every day | ORAL | 1 refills | Status: DC

## 2023-11-14 MED ORDER — SIMVASTATIN 10 MG PO TABS
10.0000 mg | ORAL_TABLET | Freq: Every day | ORAL | 2 refills | Status: DC
Start: 2023-11-14 — End: 2024-01-15

## 2023-11-14 MED ORDER — VALSARTAN 40 MG PO TABS: 40.0000 mg | ORAL_TABLET | Freq: Every evening | ORAL | 2 refills | Status: DC

## 2023-11-14 NOTE — H&P (View-Only) (Signed)
 Cardiology Office Note:  .   Date:  11/14/2023  ID:  Erin Li, DOB May 26, 1941, MRN [id number] PCP: Erin Reichmann, DO  [facility] Providers Cardiologist:  Erin Decamp, MD   History of Present Illness: Erin Li   Erin Li is a 83 y.o. Caucasian female patient who is admitted to the hospital recently on 10/17/2023 and discharged on 10/23/2023 with new onset A-fib with RVR and acute diastolic heart failure along with COPD exacerbation.  Past medical history significant for COPD, CAD with remote angioplasty, details not available but she was in [place], hypertension and hypercholesterolemia.  In view of CHA2DS2-VASc rescore of 5, she was started on Eliquis.  She now presents to establish care in the outpatient clinic with me.  Discussed the use of AI scribe software for clinical note transcription with the patient, who gave verbal consent to proceed.  History of Present Illness   Erin Li, a patient with a history of smoking and recent hospitalization due to atrial fibrillation (AFib), presents with ongoing fatigue and shortness of breath. The patient has been on Eliquis for AFib for about a month. She expresses concern about a possible allergic reaction to the medication, suggesting it may be contributing to her fatigue. The patient also reports having blockages in her heart and legs, likely due to her history of smoking. Despite these health issues, the patient continues to work at a [employer], although she notes increased breathlessness with exertion.      Labs   Lab Results  Component Value Date   CHOL 128 10/19/2023   HDL 39 (L) 10/19/2023   LDLCALC 78 10/19/2023   TRIG 56 10/19/2023   CHOLHDL 3.3 10/19/2023   Lab Results  Component Value Date   NA 140 10/31/2023   K 3.9 10/31/2023   CO2 26 10/31/2023   GLUCOSE 107 (H) 10/31/2023   BUN 14 10/31/2023   CREATININE 0.97 10/31/2023   CALCIUM 8.7 (L) 10/31/2023   GFR 80.50 09/22/2022    GFRNONAA 59 (L) 10/31/2023      Latest Ref Rng & Units 10/31/2023    3:22 PM 10/23/2023    2:43 AM 10/22/2023    2:21 AM  BMP  Glucose 70 - 99 mg/dL 409  96  811   BUN 8 - 23 mg/dL 14  30  33   Creatinine 0.44 - 1.00 mg/dL 9.14  7.82  9.56   Sodium 135 - 145 mmol/L 140  140  141   Potassium 3.5 - 5.1 mmol/L 3.9  4.4  4.5   Chloride 98 - 111 mmol/L 108  105  104   CO2 22 - 32 mmol/L 26  23  28    Calcium 8.9 - 10.3 mg/dL 8.7  8.9  8.9       Latest Ref Rng & Units 10/23/2023    2:43 AM 10/21/2023    2:21 AM 10/20/2023    2:25 AM  CBC  WBC 4.0 - 10.5 K/uL 7.6  8.2  8.8   Hemoglobin 12.0 - 15.0 g/dL 21.3  08.6  57.8   Hematocrit 36.0 - 46.0 % 41.4  38.7  37.0   Platelets 150 - 400 K/uL 319  259  236    No results found for: "HGBA1C"  Lab Results  Component Value Date   TSH 0.738 10/18/2023   Review of Systems  Cardiovascular:  Positive for dyspnea on exertion. Negative for chest pain and leg swelling.   Physical Exam:   VS:  BP 110/74 (BP Location: Left Arm, Patient Position: Sitting, Cuff Size: Normal)   Pulse (!) 52   Resp 16   Ht 5\' 5"  (1.651 m)   Wt 147 lb 9.6 oz (67 kg)   LMP 09/04/1988 (Approximate)   SpO2 96%   BMI 24.56 kg/m    Wt Readings from Last 3 Encounters:  11/14/23 147 lb 9.6 oz (67 kg)  11/06/23 146 lb 3.2 oz (66.3 kg)  10/31/23 146 lb 3.2 oz (66.3 kg)    Physical Exam Neck:     Vascular: No carotid bruit or JVD.  Cardiovascular:     Rate and Rhythm: Normal rate and regular rhythm.     Pulses:          Femoral pulses are 0 on the right side and 0 on the left side.      Popliteal pulses are 0 on the right side and 0 on the left side.       Dorsalis pedis pulses are 0 on the right side and 0 on the left side.       Posterior tibial pulses are 0 on the right side and 0 on the left side.     Heart sounds: Normal heart sounds. No murmur heard.    No gallop.  Pulmonary:     Effort: Pulmonary effort is normal.     Breath sounds: Normal breath sounds.   Abdominal:     General: Bowel sounds are normal.     Palpations: Abdomen is soft.  Musculoskeletal:     Right lower leg: No edema.     Left lower leg: No edema.    Studies Reviewed: Erin Li    ECHOCARDIOGRAM COMPLETE 10/18/2023  1. Left ventricular ejection fraction, by estimation, is 40 to 45%. The left ventricle has mildly decreased function. The left ventricle demonstrates global hypokinesis. Left ventricular diastolic parameters are consistent with Grade II diastolic dysfunction (pseudonormalization). 2. Right ventricular systolic function is mildly reduced. The right ventricular size is mildly enlarged. There is moderately elevated pulmonary artery systolic pressure. The estimated right ventricular systolic pressure is 59.3 mmHg. 3. Left atrial size was moderately dilated. 4. Right atrial size was moderately dilated. 5. The mitral valve is normal in structure. 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 No evidence of mitral stenosis. Moderate mitral annular calcification. 6. Tricuspid valve regurgitation is moderate. 7. The aortic valve is tricuspid. There is mild calcification of the aortic valve. Aortic valve regurgitation is not visualized. No aortic stenosis is present. 8. The inferior vena cava is normal in size with <50% respiratory variability, suggesting right atrial pressure of 8 mmHg.   High-resolution CT scan of the chest 10/06/2023: Cardiovascular: Heart size is normal. There is no significant pericardial fluid, thickening or pericardial calcification. There is aortic atherosclerosis, as well as atherosclerosis of the great vessels of the mediastinum and the coronary arteries, including calcified atherosclerotic plaque in the left main, left anterior descending, left circumflex and right coronary arteries. Calcifications of the aortic valve and mitral annulus.  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 Mild progression of disease compared to the prior study. There is also moderate centrilobular and paraseptal emphysema   EKG:   EKG 11/06/2023: Atrial fibrillation with controlled ventricular response at the rate of 95 bpm, normal axis, nonspecific T abnormality.  Low-voltage complexes, pulmonary disease pattern.  Occasional PVCs (2).  Medications and allergies  Allergies  Allergen Reactions   Digoxin And Related Other (See Comments)    Red palms   Metoprolol Tartrate     Other Reaction(s): 2nd degree HB     Current Outpatient Medications:    apixaban (ELIQUIS) 5 MG TABS tablet, Take 1 tablet (5 mg total) by mouth 2 (two) times daily., Disp: 180 tablet, Rfl: 0   Ascorbic Acid (VITAMIN C) 1000 MG tablet, Take 1,000 mg by mouth daily., Disp: , Rfl:    b complex vitamins tablet, Take 2 tablets by mouth daily., Disp: , Rfl:    bisoprolol (ZEBETA) 5 MG tablet, Take 1.5 tablets (7.5 mg total) by mouth daily., Disp: 135 tablet, Rfl: 0   calcium-vitamin D 250-100 MG-UNIT per tablet, Take 1 tablet by mouth daily. , Disp: , Rfl:    docusate sodium (COLACE) 100 MG capsule, Take 1 capsule (100 mg total) by mouth 2 (two) times daily., Disp: 10 capsule, Rfl: 0   fluticasone furoate-vilanterol (BREO ELLIPTA) 100-25 MCG/ACT AEPB, inhale ONE PUFF into THE lungs EVERY DAY, Disp: 60 each, Rfl: 11   furosemide (LASIX) 40 MG tablet, Take 1 tablet (40 mg total) by mouth daily as needed., Disp: 30 tablet, Rfl: 0   glucosamine-chondroitin 500-400 MG tablet, Take 1 tablet by mouth every morning., Disp: , Rfl:    guaiFENesin (MUCINEX) 600 MG 12 hr tablet, Take 600 mg by mouth 2 (two) times daily., Disp: , Rfl:    JARDIANCE 10 MG TABS tablet, Take 1 tablet (10 mg total) by mouth daily before breakfast., Disp: 30 tablet, Rfl: 11   LUTEIN PO, Take 1 tablet by mouth daily., Disp: , Rfl:    magnesium 30 MG tablet, Take 30 mg by mouth daily  in the afternoon., Disp: , Rfl:    Melatonin 10 MG TABS, Take 10 mg by mouth at bedtime., Disp: , Rfl:    omeprazole (PRILOSEC) 20 MG capsule, Take 20 mg by mouth daily., Disp: , Rfl:    OVER THE COUNTER MEDICATION, Take 2 mg by mouth daily in the afternoon. Biotin 2 mg, Disp: , Rfl:    OVER THE COUNTER MEDICATION, Place 1 drop into both eyes daily as needed (dry eye). OTC eye drop, Disp: , Rfl:    PAPAYA PO, Take 3 tablets by mouth daily as needed (heartburn)., Disp: , Rfl:    potassium chloride SA (KLOR-CON M) 20 MEQ tablet, Take 2 tablets (40 mEq total) by mouth daily as needed (when you take Furosemide)., Disp: 60 tablet, Rfl: 0   Red Yeast Rice 600 MG TABS, Take 1,200 mg by mouth daily., Disp: , Rfl:    selenium 50 MCG TABS, Take 50 mcg by mouth daily., Disp: , Rfl:    simvastatin (ZOCOR) 10 MG tablet, Take 1 tablet (10 mg total) by mouth at bedtime., Disp: 30 tablet, Rfl: 2   sotalol (BETAPACE) 80 MG tablet, Take 1 tablet (80 mg total) by mouth daily., Disp: 30 tablet, Rfl: 1   tiotropium (SPIRIVA) 18 MCG inhalation capsule, Place 18 mcg into inhaler and inhale daily., Disp: , Rfl:    valsartan (DIOVAN) 40 MG tablet, Take 1 tablet (40 mg total) by mouth every evening., Disp: 30 tablet, Rfl: 2   vitamin B-12 (CYANOCOBALAMIN) 1000 MCG tablet, Take 1,000 mcg by mouth daily., Disp: , Rfl:    vitamin E 200 UNIT capsule, Take 200 Units by mouth daily., Disp: , Rfl:    ASSESSMENT AND PLAN: .      ICD-10-CM   1.  Persistent atrial fibrillation (HCC)  I48.19 sotalol (BETAPACE) 80 MG tablet    VAS [place] AAA DUPLEX    2. Chronic heart failure with mildly reduced ejection fraction (HFmrEF, 41-49%) (HCC)  I50.22 valsartan (DIOVAN) 40 MG tablet    Basic metabolic panel    Pro b natriuretic peptide (BNP)    3. Coronary artery disease involving native coronary artery of native heart without angina pectoris  I25.10 simvastatin (ZOCOR) 10 MG tablet    Basic metabolic panel    Pro b natriuretic peptide  (BNP)    VAS [place] AAA DUPLEX    4. Centrilobular emphysema (HCC)  J43.2     5. PAD (peripheral artery disease) (HCC)  I73.9 VAS [place] ABI WITH/WO TBI    VAS [place] LOWER EXTREMITY ARTERIAL DUPLEX    6. Pre-procedure lab exam  [phone] Basic metabolic panel    CBC      Assessment and Plan    Atrial Fibrillation (AFib)   She has recent onset atrial fibrillation, diagnosed last month, which required hospitalization. Currently on Eliquis for anticoagulation, she experiences fatigue and dyspnea, possibly related to medication or AFib. Initiate sotalol under supervision to manage rhythm and prepare for potential cardioversion. Cardioversion is planned in two weeks to restore normal rhythm, with a very low risk of complications. The procedure involves sedation and electrical shock to the heart, with a good chance of success due to the recent onset of AFib. Prescribe sotalol and administer the first dose in-office under supervision for 4-5 hours. Continue Eliquis for anticoagulation.  Heart Failure   She has heart failure with reduced ejection fraction, indicated by the need for Jardiance. Symptoms of fatigue and shortness of breath may be related to heart failure. Continue Jardiance and bisoprolol for heart failure management.  I also added valsartan 40 mg daily, her blood pressure is very soft at 110/74 mmHg.  Eventually once she tolerates sotalol, I could discontinue her beta-blocker completely versus continuing both bisoprolol and sotalol together.  Will avoid amiodarone in view of underlying COPD and centrilobular emphysema.  Will obtain a baseline BNP and also perform BMP as a serum creatinine slightly risen while she was in the hospital  Coronary Artery Disease (CAD)   She has coronary artery disease with previous balloon angioplasty that was [facility] [place] [facility]. A recent CT scan shows significant coronary atherosclerosis which is to be expected. Currently asymptomatic for  angina but has smoking-related vascular issues.  Her lipids are normal on red yeast rice however would like to add statins, she had previously not tolerated statins, will start her on Simvastatin 10 mg daily for cholesterol management. Discontinue red yeast rice if simvastatin is tolerated.  Will increase the dose to 20 mg or 140 mg depending upon her tolerability.  Peripheral Artery Disease (PAD)   She has smoking-related PAD.Erin Li Currently asymptomatic for claudication but has circulation issues. Order abdominal aortic duplex and ABI to assess PAD. Advise on smoking cessation to prevent further vascular damage.  I could not feel her femoral pulses or popliteal or pedal pulses although capillary refill is <2 seconds and there is no clinical evidence of critical limb ischemia.  She has occasional weakness and cramping in the left hip.  Hyperlipidemia   She has mildly elevated cholesterol levels with previous intolerance to statins due to musculoskeletal side effects. Currently using red yeast rice for cholesterol management. Prescribe simvastatin 10 mg daily as a low-dose alternative to manage cholesterol, with potential cardiovascular protection.  Monitor for musculoskeletal side effects and adjust treatment if necessary.  Follow-up   Schedule a follow-up visit in 6-8 weeks post-cardioversion. Ensure she returns for sotalol administration and monitoring in-office. Coordinate the cardioversion procedure in two weeks.  Her husband is present and all questions answered.          Signed,  Erin Decamp, MD, [facility] 11/14/2023, 11:59 AM [facility] [address] [place], [place] [address] Phone: [phone]. Fax:  [phone]

## 2023-11-14 NOTE — Telephone Encounter (Signed)
 Pt c/o medication issue:  1. Name of Medication: Valsartan, Simvastatin and Sotalol  2. How are you currently taking this medication (dosage and times per day)?   3. Are you having a reaction (difficulty breathing--STAT)?   4. What is your medication issue? Patient said she saw Dr Jacinto Halim today and he is starting her on these medicine.  She says she have some questions please

## 2023-11-14 NOTE — Telephone Encounter (Signed)
 Spoke with patient and discussed medication instructions given by Dr. Jacinto Halim today.  VORB with Dr. Jacinto Halim: Patient may start simvastatin and valsartan this evening. She will take first dose of sotalol at 10 am on 11/20/23 in office and will have nurse visit for follow-up EKG to monitor effect. Nurse visit scheduled for same day Dr. Jacinto Halim is in the office.   Patient also sent MyChart message with medication questions asked here. Reply sent, then updated reply on sotalol instructions sent after this phone conversation.  Patient verbalized understanding of the above and expressed appreciation for follow-up.

## 2023-11-14 NOTE — Patient Instructions (Signed)
 Medication Instructions:  Your physician has recommended you make the following change in your medication:   1) START sotalol 80 mg daily 2) START simvastatin 10 mg daily 3) START valsartan 40 mg daily  *If you need a refill on your cardiac medications before your next appointment, please call your pharmacy*  Lab Work: TODAY: CBC, BMET, BNP If you have labs (blood work) drawn today and your tests are completely normal, you will receive your results only by: MyChart Message (if you have MyChart) OR A paper copy in the mail If you have any lab test that is abnormal or we need to change your treatment, we will call you to review the results.  Testing/Procedures: Your physician has recommended that you have a Cardioversion (DCCV) in 2 weeks. Electrical Cardioversion uses a jolt of electricity to your heart either through paddles or wired patches attached to your chest. This is a controlled, usually prescheduled, procedure. Defibrillation is done under light anesthesia in the hospital, and you usually go home the day of the procedure. This is done to get your heart back into a normal rhythm. You are not awake for the procedure. Please see the instruction sheet given to you today.  Your physician has requested that you have an abdominal aorta duplex. During this test, an ultrasound is used to evaluate the aorta. Allow 30 minutes for this exam. Do not eat after midnight the day before and avoid carbonated beverages.  Please note: We ask at that you not bring children with you during ultrasound (echo/ vascular) testing. Due to room size and safety concerns, children are not allowed in the ultrasound rooms during exams. Our front office staff cannot provide observation of children in our lobby area while testing is being conducted. An adult accompanying a patient to their appointment will only be allowed in the ultrasound room at the discretion of the ultrasound technician under special circumstances. We  apologize for any inconvenience.   Your physician has requested that you have a lower extremity arterial duplex. This test is an ultrasound of the arteries in the legs or arms. It looks at arterial blood flow in the legs and arms. Allow one hour for Lower and Upper Arterial scans. There are no restrictions or special instructions.  Please note: We ask at that you not bring children with you during ultrasound (echo/ vascular) testing. Due to room size and safety concerns, children are not allowed in the ultrasound rooms during exams. Our front office staff cannot provide observation of children in our lobby area while testing is being conducted. An adult accompanying a patient to their appointment will only be allowed in the ultrasound room at the discretion of the ultrasound technician under special circumstances. We apologize for any inconvenience.   Your physician has requested that you have an ankle brachial index (ABI). During this test an ultrasound and blood pressure cuff are used to evaluate the arteries that supply the arms and legs with blood. Allow thirty minutes for this exam. There are no restrictions or special instructions.  Please note: We ask at that you not bring children with you during ultrasound (echo/ vascular) testing. Due to room size and safety concerns, children are not allowed in the ultrasound rooms during exams. Our front office staff cannot provide observation of children in our lobby area while testing is being conducted. An adult accompanying a patient to their appointment will only be allowed in the ultrasound room at the discretion of the ultrasound technician under special circumstances.  We apologize for any inconvenience.   Follow-Up: At [facility], you and your health needs are our priority.  As part of our continuing mission to provide you with exceptional heart care, we have created designated Provider Care Teams.  These Care Teams include your primary  Cardiologist (physician) and Advanced Practice Providers (APPs -  Physician Assistants and Nurse Practitioners) who all work together to provide you with the care you need, when you need it.  Your next appointment:   6-8  week(s)  The format for your next appointment:   In Person  Provider:   Yates Decamp, MD {  Other Instructions You are scheduled for a nurse visit on Tuesday November 20, 2023 at 2:00 PM. Please take your sotalol around 10:00 AM this day and we will get an EKG at nurse visit for Dr. Jacinto Halim to review.    Dear [name]  You are scheduled for a Cardioversion on Wednesday, March 26 with Dr. Rennis Golden.  Please arrive at the [facility] (Main Entrance A) at [facility]: [address] [place], [place] [address] at 8:00 AM (This time is 1 hour(s) before your procedure to ensure your preparation).   Free valet parking service is available. You will check in at ADMITTING.   *Please Note: You will receive a call the day before your procedure to confirm the appointment time. That time may have changed from the original time based on the schedule for that day.*   DIET:  Nothing to eat or drink after midnight except a sip of water with medications (see medication instructions below)  MEDICATION INSTRUCTIONS: !!IF ANY NEW MEDICATIONS ARE STARTED AFTER TODAY, PLEASE NOTIFY YOUR PROVIDER AS SOON AS POSSIBLE!!  FYI: Medications such as Semaglutide (Ozempic, [place]), Tirzepatide (Mounjaro, Zepbound), Dulaglutide (Trulicity), etc ("GLP1 agonists") AND Canagliflozin (Invokana), Dapagliflozin (Farxiga), Empagliflozin (Jardiance), Ertugliflozin (Ste[place]atro), Bexagliflozin [employer]) or any combination with one of these drugs such as Invokamet (Canagliflozin/Metformin), Synjardy (Empagliflozin/Metformin), etc ("SGLT2 inhibitors") must be held around the time of a procedure. This is not a comprehensive list of all of these drugs. Please review all of your medications and talk to your  provider if you take any one of these. If you are not sure, ask your provider.   HOLD: Empagliflozin (Jardiance) for 3 days prior to the procedure. Last dose on Saturday, March 22.   Continue taking your anticoagulant (blood thinner): Apixaban (Eliquis).  You will need to continue this after your procedure until you are told by your provider that it is safe to stop.    LABS:TODAY (CBC, BMET, BNP)  FYI:  For your safety, and to allow [place] to monitor your vital signs accurately during the surgery/procedure we request: If you have artificial nails, gel coating, SNS etc, please have those removed prior to your surgery/procedure. Not having the nail coverings /polish removed may result in cancellation or delay of your surgery/procedure.  Your support person will be asked to wait in the waiting room during your procedure.  It is OK to have someone drop you off and come back when you are ready to be discharged.  You cannot drive after the procedure and will need someone to drive you home.  Bring your insurance cards.  *Special Note: Every effort is made to have your procedure done on time. Occasionally there are emergencies that occur at the hospital that may cause delays. Please be patient if a delay does occur.       1st Floor: - Lobby - Registration  -  Pharmacy  - Lab - Cafe  2nd Floor: - PV Lab - Diagnostic Testing (echo, CT, nuclear med)  3rd Floor: - Vacant  4th Floor: - TCTS (cardiothoracic surgery) - AFib Clinic - Structural Heart Clinic - Vascular Surgery  - Vascular Ultrasound  5th Floor: - HeartCare Cardiology (general and EP) - Clinical Pharmacy for coumadin, hypertension, lipid, weight-loss medications, and med management appointments    Valet parking services will be available as well.

## 2023-11-14 NOTE — Progress Notes (Signed)
 Cardiology Office Note:  .   Date:  11/14/2023  ID:  [name], DOB May 26, 1941, MRN [id number] PCP: [name], DO  [facility] Providers Cardiologist:  Yates Decamp, MD   History of Present Illness: Erin Li   Erin Li is a 83 y.o. Caucasian female patient who is admitted to the hospital recently on 10/17/2023 and discharged on 10/23/2023 with new onset A-fib with RVR and acute diastolic heart failure along with COPD exacerbation.  Past medical history significant for COPD, CAD with remote angioplasty, details not available but she was in [place], hypertension and hypercholesterolemia.  In view of CHA2DS2-VASc rescore of 5, she was started on Eliquis.  She now presents to establish care in the outpatient clinic with me.  Discussed the use of AI scribe software for clinical note transcription with the patient, who gave verbal consent to proceed.  History of Present Illness   [name], a patient with a history of smoking and recent hospitalization due to atrial fibrillation (AFib), presents with ongoing fatigue and shortness of breath. The patient has been on Eliquis for AFib for about a month. She expresses concern about a possible allergic reaction to the medication, suggesting it may be contributing to her fatigue. The patient also reports having blockages in her heart and legs, likely due to her history of smoking. Despite these health issues, the patient continues to work at a [employer], although she notes increased breathlessness with exertion.      Labs   Lab Results  Component Value Date   CHOL 128 10/19/2023   HDL 39 (L) 10/19/2023   LDLCALC 78 10/19/2023   TRIG 56 10/19/2023   CHOLHDL 3.3 10/19/2023   Lab Results  Component Value Date   NA 140 10/31/2023   K 3.9 10/31/2023   CO2 26 10/31/2023   GLUCOSE 107 (H) 10/31/2023   BUN 14 10/31/2023   CREATININE 0.97 10/31/2023   CALCIUM 8.7 (L) 10/31/2023   GFR 80.50 09/22/2022    GFRNONAA 59 (L) 10/31/2023      Latest Ref Rng & Units 10/31/2023    3:22 PM 10/23/2023    2:43 AM 10/22/2023    2:21 AM  BMP  Glucose 70 - 99 mg/dL 409  96  811   BUN 8 - 23 mg/dL 14  30  33   Creatinine 0.44 - 1.00 mg/dL 9.14  7.82  9.56   Sodium 135 - 145 mmol/L 140  140  141   Potassium 3.5 - 5.1 mmol/L 3.9  4.4  4.5   Chloride 98 - 111 mmol/L 108  105  104   CO2 22 - 32 mmol/L 26  23  28    Calcium 8.9 - 10.3 mg/dL 8.7  8.9  8.9       Latest Ref Rng & Units 10/23/2023    2:43 AM 10/21/2023    2:21 AM 10/20/2023    2:25 AM  CBC  WBC 4.0 - 10.5 K/uL 7.6  8.2  8.8   Hemoglobin 12.0 - 15.0 g/dL 21.3  08.6  57.8   Hematocrit 36.0 - 46.0 % 41.4  38.7  37.0   Platelets 150 - 400 K/uL 319  259  236    No results found for: "HGBA1C"  Lab Results  Component Value Date   TSH 0.738 10/18/2023   Review of Systems  Cardiovascular:  Positive for dyspnea on exertion. Negative for chest pain and leg swelling.   Physical Exam:   VS:  BP 110/74 (BP Location: Left Arm, Patient Position: Sitting, Cuff Size: Normal)   Pulse (!) 52   Resp 16   Ht 5\' 5"  (1.651 m)   Wt 147 lb 9.6 oz (67 kg)   LMP 09/04/1988 (Approximate)   SpO2 96%   BMI 24.56 kg/m    Wt Readings from Last 3 Encounters:  11/14/23 147 lb 9.6 oz (67 kg)  11/06/23 146 lb 3.2 oz (66.3 kg)  10/31/23 146 lb 3.2 oz (66.3 kg)    Physical Exam Neck:     Vascular: No carotid bruit or JVD.  Cardiovascular:     Rate and Rhythm: Normal rate and regular rhythm.     Pulses:          Femoral pulses are 0 on the right side and 0 on the left side.      Popliteal pulses are 0 on the right side and 0 on the left side.       Dorsalis pedis pulses are 0 on the right side and 0 on the left side.       Posterior tibial pulses are 0 on the right side and 0 on the left side.     Heart sounds: Normal heart sounds. No murmur heard.    No gallop.  Pulmonary:     Effort: Pulmonary effort is normal.     Breath sounds: Normal breath sounds.   Abdominal:     General: Bowel sounds are normal.     Palpations: Abdomen is soft.  Musculoskeletal:     Right lower leg: No edema.     Left lower leg: No edema.    Studies Reviewed: Erin Li    ECHOCARDIOGRAM COMPLETE 10/18/2023  1. Left ventricular ejection fraction, by estimation, is 40 to 45%. The left ventricle has mildly decreased function. The left ventricle demonstrates global hypokinesis. Left ventricular diastolic parameters are consistent with Grade II diastolic dysfunction (pseudonormalization). 2. Right ventricular systolic function is mildly reduced. The right ventricular size is mildly enlarged. There is moderately elevated pulmonary artery systolic pressure. The estimated right ventricular systolic pressure is 59.3 mmHg. 3. Left atrial size was moderately dilated. 4. Right atrial size was moderately dilated. 5. The mitral valve is normal in structure. 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 No evidence of mitral stenosis. Moderate mitral annular calcification. 6. Tricuspid valve regurgitation is moderate. 7. The aortic valve is tricuspid. There is mild calcification of the aortic valve. Aortic valve regurgitation is not visualized. No aortic stenosis is present. 8. The inferior vena cava is normal in size with <50% respiratory variability, suggesting right atrial pressure of 8 mmHg.   High-resolution CT scan of the chest 10/06/2023: Cardiovascular: Heart size is normal. There is no significant pericardial fluid, thickening or pericardial calcification. There is aortic atherosclerosis, as well as atherosclerosis of the great vessels of the mediastinum and the coronary arteries, including calcified atherosclerotic plaque in the left main, left anterior descending, left circumflex and right coronary arteries. Calcifications of the aortic valve and mitral annulus.  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 Mild progression of disease compared to the prior study. There is also moderate centrilobular and paraseptal emphysema   EKG:   EKG 11/06/2023: Atrial fibrillation with controlled ventricular response at the rate of 95 bpm, normal axis, nonspecific T abnormality.  Low-voltage complexes, pulmonary disease pattern.  Occasional PVCs (2).  Medications and allergies  Allergies  Allergen Reactions   Digoxin And Related Other (See Comments)    Red palms   Metoprolol Tartrate     Other Reaction(s): 2nd degree HB     Current Outpatient Medications:    apixaban (ELIQUIS) 5 MG TABS tablet, Take 1 tablet (5 mg total) by mouth 2 (two) times daily., Disp: 180 tablet, Rfl: 0   Ascorbic Acid (VITAMIN C) 1000 MG tablet, Take 1,000 mg by mouth daily., Disp: , Rfl:    b complex vitamins tablet, Take 2 tablets by mouth daily., Disp: , Rfl:    bisoprolol (ZEBETA) 5 MG tablet, Take 1.5 tablets (7.5 mg total) by mouth daily., Disp: 135 tablet, Rfl: 0   calcium-vitamin D 250-100 MG-UNIT per tablet, Take 1 tablet by mouth daily. , Disp: , Rfl:    docusate sodium (COLACE) 100 MG capsule, Take 1 capsule (100 mg total) by mouth 2 (two) times daily., Disp: 10 capsule, Rfl: 0   fluticasone furoate-vilanterol (BREO ELLIPTA) 100-25 MCG/ACT AEPB, inhale ONE PUFF into THE lungs EVERY DAY, Disp: 60 each, Rfl: 11   furosemide (LASIX) 40 MG tablet, Take 1 tablet (40 mg total) by mouth daily as needed., Disp: 30 tablet, Rfl: 0   glucosamine-chondroitin 500-400 MG tablet, Take 1 tablet by mouth every morning., Disp: , Rfl:    guaiFENesin (MUCINEX) 600 MG 12 hr tablet, Take 600 mg by mouth 2 (two) times daily., Disp: , Rfl:    JARDIANCE 10 MG TABS tablet, Take 1 tablet (10 mg total) by mouth daily before breakfast., Disp: 30 tablet, Rfl: 11   LUTEIN PO, Take 1 tablet by mouth daily., Disp: , Rfl:    magnesium 30 MG tablet, Take 30 mg by mouth daily  in the afternoon., Disp: , Rfl:    Melatonin 10 MG TABS, Take 10 mg by mouth at bedtime., Disp: , Rfl:    omeprazole (PRILOSEC) 20 MG capsule, Take 20 mg by mouth daily., Disp: , Rfl:    OVER THE COUNTER MEDICATION, Take 2 mg by mouth daily in the afternoon. Biotin 2 mg, Disp: , Rfl:    OVER THE COUNTER MEDICATION, Place 1 drop into both eyes daily as needed (dry eye). OTC eye drop, Disp: , Rfl:    PAPAYA PO, Take 3 tablets by mouth daily as needed (heartburn)., Disp: , Rfl:    potassium chloride SA (KLOR-CON M) 20 MEQ tablet, Take 2 tablets (40 mEq total) by mouth daily as needed (when you take Furosemide)., Disp: 60 tablet, Rfl: 0   Red Yeast Rice 600 MG TABS, Take 1,200 mg by mouth daily., Disp: , Rfl:    selenium 50 MCG TABS, Take 50 mcg by mouth daily., Disp: , Rfl:    simvastatin (ZOCOR) 10 MG tablet, Take 1 tablet (10 mg total) by mouth at bedtime., Disp: 30 tablet, Rfl: 2   sotalol (BETAPACE) 80 MG tablet, Take 1 tablet (80 mg total) by mouth daily., Disp: 30 tablet, Rfl: 1   tiotropium (SPIRIVA) 18 MCG inhalation capsule, Place 18 mcg into inhaler and inhale daily., Disp: , Rfl:    valsartan (DIOVAN) 40 MG tablet, Take 1 tablet (40 mg total) by mouth every evening., Disp: 30 tablet, Rfl: 2   vitamin B-12 (CYANOCOBALAMIN) 1000 MCG tablet, Take 1,000 mcg by mouth daily., Disp: , Rfl:    vitamin E 200 UNIT capsule, Take 200 Units by mouth daily., Disp: , Rfl:    ASSESSMENT AND PLAN: .      ICD-10-CM   1.  Persistent atrial fibrillation (HCC)  I48.19 sotalol (BETAPACE) 80 MG tablet    VAS [place] AAA DUPLEX    2. Chronic heart failure with mildly reduced ejection fraction (HFmrEF, 41-49%) (HCC)  I50.22 valsartan (DIOVAN) 40 MG tablet    Basic metabolic panel    Pro b natriuretic peptide (BNP)    3. Coronary artery disease involving native coronary artery of native heart without angina pectoris  I25.10 simvastatin (ZOCOR) 10 MG tablet    Basic metabolic panel    Pro b natriuretic peptide  (BNP)    VAS [place] AAA DUPLEX    4. Centrilobular emphysema (HCC)  J43.2     5. PAD (peripheral artery disease) (HCC)  I73.9 VAS [place] ABI WITH/WO TBI    VAS [place] LOWER EXTREMITY ARTERIAL DUPLEX    6. Pre-procedure lab exam  [phone] Basic metabolic panel    CBC      Assessment and Plan    Atrial Fibrillation (AFib)   She has recent onset atrial fibrillation, diagnosed last month, which required hospitalization. Currently on Eliquis for anticoagulation, she experiences fatigue and dyspnea, possibly related to medication or AFib. Initiate sotalol under supervision to manage rhythm and prepare for potential cardioversion. Cardioversion is planned in two weeks to restore normal rhythm, with a very low risk of complications. The procedure involves sedation and electrical shock to the heart, with a good chance of success due to the recent onset of AFib. Prescribe sotalol and administer the first dose in-office under supervision for 4-5 hours. Continue Eliquis for anticoagulation.  Heart Failure   She has heart failure with reduced ejection fraction, indicated by the need for Jardiance. Symptoms of fatigue and shortness of breath may be related to heart failure. Continue Jardiance and bisoprolol for heart failure management.  I also added valsartan 40 mg daily, her blood pressure is very soft at 110/74 mmHg.  Eventually once she tolerates sotalol, I could discontinue her beta-blocker completely versus continuing both bisoprolol and sotalol together.  Will avoid amiodarone in view of underlying COPD and centrilobular emphysema.  Will obtain a baseline BNP and also perform BMP as a serum creatinine slightly risen while she was in the hospital  Coronary Artery Disease (CAD)   She has coronary artery disease with previous balloon angioplasty that was performed [facility] [place] [facility]. A recent CT scan shows significant coronary atherosclerosis which is to be expected. Currently asymptomatic for  angina but has smoking-related vascular issues.  Her lipids are normal on red yeast rice however would like to add statins, she had previously not tolerated statins, will start her on Simvastatin 10 mg daily for cholesterol management. Discontinue red yeast rice if simvastatin is tolerated.  Will increase the dose to 20 mg or 140 mg depending upon her tolerability.  Peripheral Artery Disease (PAD)   She has smoking-related PAD.Erin Li Currently asymptomatic for claudication but has circulation issues. Order abdominal aortic duplex and ABI to assess PAD. Advise on smoking cessation to prevent further vascular damage.  I could not feel her femoral pulses or popliteal or pedal pulses although capillary refill is <2 seconds and there is no clinical evidence of critical limb ischemia.  She has occasional weakness and cramping in the left hip.  Hyperlipidemia   She has mildly elevated cholesterol levels with previous intolerance to statins due to musculoskeletal side effects. Currently using red yeast rice for cholesterol management. Prescribe simvastatin 10 mg daily as a low-dose alternative to manage cholesterol, with potential cardiovascular protection.  Monitor for musculoskeletal side effects and adjust treatment if necessary.  Follow-up   Schedule a follow-up visit in 6-8 weeks post-cardioversion. Ensure she returns for sotalol administration and monitoring in-office. Coordinate the cardioversion procedure in two weeks.  Her husband is present and all questions answered.          Signed,  Yates Decamp, MD, [facility] 11/14/2023, 11:59 AM [facility] [address] [place], [place] [address] Phone: [phone]. Fax:  [phone]

## 2023-11-15 ENCOUNTER — Encounter: Payer: Self-pay | Admitting: Cardiology

## 2023-11-15 LAB — CBC
Hematocrit: 40.6 % (ref 34.0–46.6)
Hemoglobin: 13.2 g/dL (ref 11.1–15.9)
MCH: 32 pg (ref 26.6–33.0)
MCHC: 32.5 g/dL (ref 31.5–35.7)
MCV: 98 fL — ABNORMAL HIGH (ref 79–97)
Platelets: 285 10*3/uL (ref 150–450)
RBC: 4.13 x10E6/uL (ref 3.77–5.28)
RDW: 12.5 % (ref 11.7–15.4)
WBC: 8.7 10*3/uL (ref 3.4–10.8)

## 2023-11-15 LAB — BASIC METABOLIC PANEL
BUN/Creatinine Ratio: 20 (ref 12–28)
BUN: 22 mg/dL (ref 8–27)
CO2: 19 mmol/L — ABNORMAL LOW (ref 20–29)
Calcium: 9.4 mg/dL (ref 8.7–10.3)
Chloride: 108 mmol/L — ABNORMAL HIGH (ref 96–106)
Creatinine, Ser: 1.12 mg/dL — ABNORMAL HIGH (ref 0.57–1.00)
Glucose: 115 mg/dL — ABNORMAL HIGH (ref 70–99)
Potassium: 4.3 mmol/L (ref 3.5–5.2)
Sodium: 144 mmol/L (ref 134–144)
eGFR: 49 mL/min/{1.73_m2} — ABNORMAL LOW (ref >59)

## 2023-11-15 LAB — PRO B NATRIURETIC PEPTIDE: NT-Pro BNP: 5882 pg/mL — ABNORMAL HIGH (ref 0–738)

## 2023-11-15 NOTE — Progress Notes (Signed)
 Mild renal insufficiency overall appears to be stable from hospitalization.  Markedly elevated NT proBNP at 5800 suggest she still has persistent heart failure, would recommend continuing Lasix every day for the next [redacted] week along with potassium supplements, will reassess as we go along.  Suspect her heart failure is related to A-fib with RVR.  Eventually I plan to do stress testing however would like to maintain sinus rhythm first and control of her heart failure symptoms prior to doing a stress test.  Otherwise keep present management as dictated previously, she will come back with sotalol on hand and take the first dose at office as previously planned.

## 2023-11-16 ENCOUNTER — Encounter: Payer: Self-pay | Admitting: Cardiology

## 2023-11-20 ENCOUNTER — Ambulatory Visit: Attending: Cardiology

## 2023-11-20 VITALS — HR 92 | Wt 144.6 lb

## 2023-11-20 DIAGNOSIS — Z79899 Other long term (current) drug therapy: Secondary | ICD-10-CM | POA: Diagnosis not present

## 2023-11-20 NOTE — Progress Notes (Signed)
   Nurse Visit   Date of Encounter: 11/20/2023 ID: Erin Li, DOB 04-Oct-1940, MRN [id number]  PCP:  [name], DO   [facility] Providers Cardiologist:  Yates Decamp, MD      Visit Details   VS:  Pulse 92   Wt 144 lb 9.6 oz (65.6 kg)   LMP 09/04/1988 (Approximate)   BMI 24.06 kg/m  , BMI Body mass index is 24.06 kg/m.  Wt Readings from Last 3 Encounters:  11/20/23 144 lb 9.6 oz (65.6 kg)  11/14/23 147 lb 9.6 oz (67 kg)  11/06/23 146 lb 3.2 oz (66.3 kg)     Reason for visit: EKG Performed today: Vitals, EKG, Provider consulted:Dr.Ganji, and Education Changes (medications, testing, etc.) : Continue taking Sotalol 80 mg daily Length of Visit: 10 minutes    Medications Adjustments/Labs and Tests Ordered: Orders Placed This Encounter  Procedures   EKG 12-Lead   EKG 12-Lead   No orders of the defined types were placed in this encounter.    Signed, Kymiah Araiza Merilynn Finland, LPN  9/62/9528 4:13 PM

## 2023-11-21 DIAGNOSIS — K8020 Calculus of gallbladder without cholecystitis without obstruction: Secondary | ICD-10-CM | POA: Diagnosis not present

## 2023-11-21 DIAGNOSIS — R748 Abnormal levels of other serum enzymes: Secondary | ICD-10-CM | POA: Diagnosis not present

## 2023-11-21 DIAGNOSIS — R945 Abnormal results of liver function studies: Secondary | ICD-10-CM | POA: Diagnosis not present

## 2023-11-21 NOTE — Telephone Encounter (Signed)
 Called [facility] for lab results.  Chesley Mires, PharmD, MPH, BCPS, CPP Clinical Pharmacist (Rheumatology and Pulmonology)

## 2023-11-21 NOTE — Telephone Encounter (Signed)
 Labs from 11/06/2023 AST 96 ALT 159  LFTs increased from 10/31/2023 AST 65 ALT 100  Appears that PCP is rechecking today. Will f/u  Chesley Mires, PharmD, MPH, BCPS, CPP Clinical Pharmacist (Rheumatology and Pulmonology)

## 2023-11-22 ENCOUNTER — Encounter: Payer: Self-pay | Admitting: Internal Medicine

## 2023-11-23 NOTE — Telephone Encounter (Signed)
 Received labs from PCP. Patient also had abdominal ultrasound completed on 11/21/23   Labs from 11/21/23 AST 35 ALT 44  Labs from 11/06/2023 AST 96 ALT 159   LFTs increased from 10/31/2023 AST 65 ALT 100

## 2023-11-26 ENCOUNTER — Other Ambulatory Visit: Payer: Self-pay

## 2023-11-27 ENCOUNTER — Other Ambulatory Visit: Payer: Self-pay

## 2023-11-27 MED ORDER — SPIRIVA RESPIMAT 2.5 MCG/ACT IN AERS: 2.0000 | INHALATION_SPRAY | Freq: Every day | RESPIRATORY_TRACT | 1 refills | Status: DC

## 2023-11-27 NOTE — Progress Notes (Signed)
 Called patient with pre-procedure instructions for tomorrow.   Patient informed of:   Time to arrive for procedure. 0800 Remain NPO past midnight.  Must have a ride home and a responsible adult to remain with them for 24 hours post procedure.  Confirmed blood thinner. Eliquis Confirmed no breaks in taking blood thinner for 3+ weeks prior to procedure. Confirmed patient stopped all GLP-1s and GLP-2s for at least one week before procedure. Last Jardiance dose 11/24/23

## 2023-11-28 ENCOUNTER — Ambulatory Visit (HOSPITAL_COMMUNITY): Admitting: Anesthesiology

## 2023-11-28 ENCOUNTER — Encounter (HOSPITAL_COMMUNITY): Payer: Self-pay | Admitting: Internal Medicine

## 2023-11-28 ENCOUNTER — Encounter (HOSPITAL_COMMUNITY)
Admission: RE | Disposition: A | Discharge status: Home / Self Care | Payer: Self-pay | Source: Home / Self Care | Attending: Internal Medicine

## 2023-11-28 ENCOUNTER — Ambulatory Visit (HOSPITAL_COMMUNITY)
Admission: RE | Admit: 2023-11-28 | Discharge: 2023-11-28 | Disposition: A | Discharge status: Home / Self Care | Attending: Internal Medicine | Admitting: Internal Medicine

## 2023-11-28 ENCOUNTER — Other Ambulatory Visit: Payer: Self-pay

## 2023-11-28 DIAGNOSIS — E7800 Pure hypercholesterolemia, unspecified: Secondary | ICD-10-CM | POA: Diagnosis not present

## 2023-11-28 DIAGNOSIS — J432 Centrilobular emphysema: Secondary | ICD-10-CM | POA: Insufficient documentation

## 2023-11-28 DIAGNOSIS — Z79899 Other long term (current) drug therapy: Secondary | ICD-10-CM | POA: Insufficient documentation

## 2023-11-28 DIAGNOSIS — I5022 Chronic systolic (congestive) heart failure: Secondary | ICD-10-CM | POA: Diagnosis not present

## 2023-11-28 DIAGNOSIS — I4819 Other persistent atrial fibrillation: Secondary | ICD-10-CM | POA: Diagnosis not present

## 2023-11-28 DIAGNOSIS — I4891 Unspecified atrial fibrillation: Secondary | ICD-10-CM | POA: Diagnosis not present

## 2023-11-28 DIAGNOSIS — Z87891 Personal history of nicotine dependence: Secondary | ICD-10-CM | POA: Insufficient documentation

## 2023-11-28 DIAGNOSIS — Z7901 Long term (current) use of anticoagulants: Secondary | ICD-10-CM | POA: Diagnosis not present

## 2023-11-28 DIAGNOSIS — I739 Peripheral vascular disease, unspecified: Secondary | ICD-10-CM | POA: Diagnosis not present

## 2023-11-28 DIAGNOSIS — I2510 Atherosclerotic heart disease of native coronary artery without angina pectoris: Secondary | ICD-10-CM | POA: Insufficient documentation

## 2023-11-28 DIAGNOSIS — I110 Hypertensive heart disease with heart failure: Secondary | ICD-10-CM

## 2023-11-28 DIAGNOSIS — I5023 Acute on chronic systolic (congestive) heart failure: Secondary | ICD-10-CM | POA: Diagnosis not present

## 2023-11-28 HISTORY — PX: CARDIOVERSION: EP1203

## 2023-11-28 SURGERY — CARDIOVERSION (CATH LAB)
Anesthesia: General

## 2023-11-28 MED ORDER — PROPOFOL 10 MG/ML IV BOLUS
INTRAVENOUS | Status: DC | PRN
  Administered 2023-11-28: 10 mg via INTRAVENOUS
  Administered 2023-11-28: 30 mg via INTRAVENOUS
  Administered 2023-11-28: 10 mg via INTRAVENOUS

## 2023-11-28 MED ORDER — SOTALOL HCL 80 MG PO TABS
80.0000 mg | ORAL_TABLET | ORAL | Status: DC
  Filled 2023-11-28: qty 1

## 2023-11-28 MED ORDER — SODIUM CHLORIDE 0.9% FLUSH: 3.0000 mL | Freq: Two times a day (BID) | INTRAVENOUS | Status: DC

## 2023-11-28 MED ORDER — SODIUM CHLORIDE 0.9% FLUSH: 3.0000 mL | INTRAVENOUS | Status: DC | PRN

## 2023-11-28 SURGICAL SUPPLY — 1 items: PAD DEFIB RADIO PHYSIO CONN (PAD) ×1 IMPLANT

## 2023-11-28 NOTE — Anesthesia Preprocedure Evaluation (Signed)
 Anesthesia Evaluation  Patient identified by MRN, date of birth, ID band  Reviewed: Allergy & Precautions, Patient's Chart, lab work & pertinent test results  History of Anesthesia Complications Negative for: history of anesthetic complications  Airway        Dental   Pulmonary COPD, former smoker          Cardiovascular hypertension, Pt. on medications + CAD     1. Left ventricular ejection fraction, by estimation, is 40 to 45%. The  left ventricle has mildly decreased function. The left ventricle  demonstrates global hypokinesis. Left ventricular diastolic parameters are  consistent with Grade II diastolic  dysfunction (pseudonormalization).   2. Right ventricular systolic function is mildly reduced. The right  ventricular size is mildly enlarged. There is moderately elevated  pulmonary artery systolic pressure. The estimated right ventricular  systolic pressure is 59.3 mmHg.   3. Left atrial size was moderately dilated.   4. Right atrial size was moderately dilated.   5. The mitral valve is normal in structure. 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 No evidence of mitral  stenosis. Moderate mitral annular calcification.   6. Tricuspid valve regurgitation is moderate.   7. The aortic valve is tricuspid. There is mild calcification of the  aortic valve. Aortic valve regurgitation is not visualized. No aortic  stenosis is present.   8. The inferior vena cava is normal in size with <50% respiratory  variability, suggesting right atrial pressure of 8 mmHg.     Neuro/Psych    GI/Hepatic negative GI ROS, Neg liver ROS,,,  Endo/Other    Renal/GU Renal InsufficiencyRenal diseaseLab Results      Component                Value               Date                      NA                       144                 11/14/2023                K                        4.3                 11/14/2023                 CO2                      19 (L)              11/14/2023                GLUCOSE                  115 (H)             11/14/2023                BUN                      22                  11/14/2023  CREATININE               1.12 (H)            11/14/2023                CALCIUM                  9.4                 11/14/2023                GFR                      80.50               09/22/2022                EGFR                     49 (L)              11/14/2023                GFRNONAA                 59 (L)              10/31/2023                Musculoskeletal   Abdominal   Peds  Hematology  (+) Blood dyscrasia Lab Results      Component                Value               Date                      WBC                      8.7                 11/14/2023                HGB                      13.2                11/14/2023                HCT                      40.6                11/14/2023                MCV                      98 (H)              11/14/2023                PLT                      285                 11/14/2023             eliquis   Anesthesia Other Findings   Reproductive/Obstetrics  Anesthesia Physical Anesthesia Plan  ASA: 3  Anesthesia Plan: General   Post-op Pain Management:    Induction: Intravenous  PONV Risk Score and Plan: 3 and Treatment may vary due to age or medical condition  Airway Management Planned: Nasal Cannula, Simple Face Mask and Natural Airway  Additional Equipment: None  Intra-op Plan:   Post-operative Plan:   Informed Consent:   Plan Discussed with:   Anesthesia Plan Comments:          Anesthesia Quick Evaluation

## 2023-11-28 NOTE — Interval H&P Note (Signed)
 History and Physical Interval Note:  11/28/2023 8:39 AM  Erin Li  has presented today for surgery, with the diagnosis of AFIB.  The various methods of treatment have been discussed with the patient and family. After consideration of risks, benefits and other options for treatment, the patient has consented to  Procedure(s): CARDIOVERSION (N/A) as a surgical intervention.  The patient's history has been reviewed, patient examined, no change in status, stable for surgery.  I have reviewed the patient's chart and labs.  Questions were answered to the patient's satisfaction.     Chrystie Nose

## 2023-11-28 NOTE — CV Procedure (Signed)
    CARDIOVERSION NOTE  Procedure: Electrical Cardioversion Indications:  Atrial Fibrillation  Procedure Details:  Consent: Risks of procedure as well as the alternatives and risks of each were explained to the (patient/caregiver).  Consent for procedure obtained.  Time Out: Verified patient identification, verified procedure, site/side was marked, verified correct patient position, special equipment/implants available, medications/allergies/relevent history reviewed, required imaging and test results available.  Performed  Patient placed on cardiac monitor, pulse oximetry, supplemental oxygen as necessary.  Sedation given:  propofol per anesthesia Pacer pads placed anterior and posterior chest.  Cardioverted 1 time(s).  Cardioverted at 300J biphasic.  Impression: Findings: Post procedure EKG shows: NSR Complications: None Patient did tolerate procedure well.  Plan: Successful DCCV with a single 300J biphasic shock to NSR.  Time Spent Directly with the Patient:  30 minutes   Chrystie Nose, MD, [facility], FACP  [place]  [facility]  Medical Director of the [facility] &  Cardiovascular Risk Reduction Clinic Diplomate of the American Board of Clinical Lipidology Attending Cardiologist  Direct Dial: [phone]  Fax: [phone]  Website:  [email or web address] Nicely Shadie Sweatman 11/28/2023, 9:24 AM

## 2023-11-28 NOTE — Discharge Instructions (Signed)

## 2023-11-28 NOTE — Transfer of Care (Signed)
 Immediate Anesthesia Transfer of Care Note  Patient: Erin Li  Procedure(s) Performed: CARDIOVERSION  Patient Location: PACU and Cath Lab  Anesthesia Type:General  Level of Consciousness: awake, alert , and oriented  Airway & Oxygen Therapy: Patient Spontanous Breathing and Patient connected to nasal cannula oxygen  Post-op Assessment: Report given to RN and Post -op Vital signs reviewed and stable  Post vital signs: Reviewed and stable  Last Vitals:  Vitals Value Taken Time  BP    Temp    Pulse    Resp    SpO2      Last Pain:  Vitals:   11/28/23 0810  TempSrc: Temporal         Complications: No notable events documented.

## 2023-11-29 ENCOUNTER — Ambulatory Visit (HOSPITAL_BASED_OUTPATIENT_CLINIC_OR_DEPARTMENT_OTHER)
Admission: RE | Admit: 2023-11-29 | Discharge: 2023-11-29 | Disposition: A | Discharge status: Home / Self Care | Source: Ambulatory Visit | Attending: Cardiology | Admitting: Cardiology

## 2023-11-29 ENCOUNTER — Encounter: Payer: Self-pay | Admitting: Cardiology

## 2023-11-29 ENCOUNTER — Ambulatory Visit (HOSPITAL_BASED_OUTPATIENT_CLINIC_OR_DEPARTMENT_OTHER)
Admission: RE | Admit: 2023-11-29 | Discharge: 2023-11-29 | Disposition: A | Discharge status: Home / Self Care | Source: Ambulatory Visit | Attending: Cardiovascular Disease | Admitting: Cardiovascular Disease

## 2023-11-29 ENCOUNTER — Ambulatory Visit (HOSPITAL_COMMUNITY)
Admission: RE | Admit: 2023-11-29 | Discharge: 2023-11-29 | Disposition: A | Discharge status: Home / Self Care | Source: Ambulatory Visit | Attending: Cardiology | Admitting: Cardiology

## 2023-11-29 DIAGNOSIS — I2510 Atherosclerotic heart disease of native coronary artery without angina pectoris: Secondary | ICD-10-CM | POA: Diagnosis not present

## 2023-11-29 DIAGNOSIS — I739 Peripheral vascular disease, unspecified: Secondary | ICD-10-CM | POA: Diagnosis not present

## 2023-11-29 DIAGNOSIS — I4819 Other persistent atrial fibrillation: Secondary | ICD-10-CM | POA: Diagnosis not present

## 2023-11-29 LAB — VAS US ABI WITH/WO TBI
Left ABI: 0.87
Right ABI: 1.02

## 2023-11-29 NOTE — Telephone Encounter (Signed)
 Thanks. They both are now < 45 and ok to start anti fibrotic but probably check 1 -2 weeks after start

## 2023-11-29 NOTE — Anesthesia Postprocedure Evaluation (Signed)
 Anesthesia Post Note  Patient: [name]  Procedure(s) Performed: CARDIOVERSION     Patient location during evaluation: Cath Lab Anesthesia Type: General Level of consciousness: awake and alert Pain management: pain level controlled Vital Signs Assessment: post-procedure vital signs reviewed and stable Respiratory status: spontaneous breathing, nonlabored ventilation and respiratory function stable Cardiovascular status: blood pressure returned to baseline and stable Postop Assessment: no apparent nausea or vomiting Anesthetic complications: no   No notable events documented.  Last Vitals:  Vitals:   11/28/23 0957 11/28/23 1000  BP: 97/71 (!) 96/58  Pulse: (!) 57 (!) 56  Resp: 15 15  Temp:    SpO2: 97% 93%    Last Pain:  Vitals:   11/28/23 0810  TempSrc: Temporal                 Keison Glendinning

## 2023-11-29 NOTE — Progress Notes (Signed)
 Findings suggest significant PAD in the left lower extremity but does not explain her left hip pain.  Continue medical therapy.  Abdominal aortic duplex and lower extremity arterial duplex with ABI 11/29/2023: Multiphasic waveform pattern in abdominal aorta and both right and left common iliac artery. No evidence of abdominal attic aneurysm.  Abdominal aortic atherosclerosis.  Bilateral iliac arteries without evidence of focal stenosis.  Right lower extremity:  Atherosclerosis of the right lower extremity arteries. 30-49% stenosis noted in the common femoral artery. Left lower extremity: Atherosclerosis of the left lower extremity arteries. 50-74% stenosis noted in the prox/mid superficial femoral artery. Occlusion of the distal posterior tibial artery. ABI/TBI  Right-1.02/0.50; Left- 0.87/0.36

## 2023-11-29 NOTE — Telephone Encounter (Signed)
 Where did you get the 11/21/23 labs showing improvement? All I hve is ending 2/26 and that is the only 1 abnormal    Latest Reference Range & Units 08/12/16 11:56 02/26/22 00:30 09/22/22 14:13 10/31/23 12:11  AST 0 - 37 U/L 23 29 27  65 (H)  ALT 0 - 35 U/L 19 23 22  100 (H)  (H): Data is abnormally high

## 2023-11-29 NOTE — Progress Notes (Signed)
 Findings suggest significant PAD in the left lower extremity but does not explain her left hip pain.  Continue medical therapy.If left leg pain continues we can consider peripheral ateriorgram  Abdominal aortic duplex and lower extremity arterial duplex with ABI 11/29/2023: Multiphasic waveform pattern in abdominal aorta and both right and left common iliac artery. No evidence of abdominal attic aneurysm.  Abdominal aortic atherosclerosis.  Bilateral iliac arteries without evidence of focal stenosis.  Right lower extremity:  Atherosclerosis of the right lower extremity arteries. 30-49% stenosis noted in the common femoral artery. Left lower extremity: Atherosclerosis of the left lower extremity arteries. 50-74% stenosis noted in the prox/mid superficial femoral artery. Occlusion of the distal posterior tibial artery. ABI/TBI  Right-1.02/0.50; Left- 0.87/0.36

## 2023-12-01 NOTE — Progress Notes (Signed)
 Labs 11/06/2023 from PCP:  AST elevated at 96, ALT elevated at 159

## 2023-12-04 ENCOUNTER — Encounter: Payer: Self-pay | Admitting: Cardiology

## 2023-12-04 ENCOUNTER — Encounter: Payer: Self-pay | Admitting: Internal Medicine

## 2023-12-04 ENCOUNTER — Other Ambulatory Visit (HOSPITAL_COMMUNITY): Payer: Self-pay

## 2023-12-04 MED ORDER — PIRFENIDONE 267 MG PO TABS
534.0000 mg | ORAL_TABLET | Freq: Three times a day (TID) | ORAL | 4 refills | Status: DC
  Filled 2023-12-07 – 2023-12-26 (×3): qty 180, 30d supply, fill #0
  Filled 2024-01-25: qty 180, 30d supply, fill #1
  Filled 2024-02-22: qty 180, 30d supply, fill #2
  Filled 2024-03-31 (×2): qty 180, 30d supply, fill #3
  Filled 2024-04-24: qty 180, 30d supply, fill #4

## 2023-12-04 MED ORDER — PIRFENIDONE 267 MG PO TABS
ORAL_TABLET | ORAL | 0 refills | Status: DC
  Filled 2023-12-07: qty 159, 30d supply, fill #0

## 2023-12-04 NOTE — Telephone Encounter (Signed)
 I spoke with patient.  She reports shortness of breath and feeling tired are the symptoms she had initially prior to hospitalization.  Never really improved.  She reports shortness of breath happens at night and first thing in the morning.  She is OK during the day and shortness of breath is not related to her activity during the day.  Cough started several weeks ago.  Last BNP was 5882 on 3/12 and message was sent to patient to continue lasix every day for the next week along with potassium supplement.  Patient did not make change and has been taking lasix and potassium as needed for weight gain of greater than 3 lbs. She thinks she has taken it about every 3-4 days for a total of 9 times since hospital discharge on 2/18.  Last took on 3/30.  Shortness of breath is better on the days she takes Lasix. I advised her to take Lasix and potassium daily for the next 3 days and I would send message to Dr Jacinto Halim for further recommendations.  Patient will also send update through my chart.

## 2023-12-04 NOTE — Telephone Encounter (Signed)
 Spoke with patient. She had afib admission on 11/28/2023 and has started multiple new cardiac medications.  Her LFTs have now normalized and she can start pirfenidone. Denies any further workup needed based on ultrasound.  No drug interactions between pirfenidone and medication list - updated med list as needed based on recent medical changes.  Rx sent to [facility] for onboarding.  Chesley Mires, PharmD, MPH, BCPS, CPP Clinical Pharmacist (Rheumatology and Pulmonology)

## 2023-12-05 ENCOUNTER — Other Ambulatory Visit (HOSPITAL_COMMUNITY): Payer: Self-pay

## 2023-12-05 NOTE — Telephone Encounter (Signed)
 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

## 2023-12-06 MED ORDER — AZITHROMYCIN 250 MG PO TABS: 250.0000 mg | ORAL_TABLET | ORAL | 0 refills | Status: DC

## 2023-12-06 MED ORDER — PREDNISONE 10 MG PO TABS: ORAL_TABLET | ORAL | 0 refills | Status: DC

## 2023-12-06 NOTE — Telephone Encounter (Signed)
 I called and spoke with the pt and notified of response per MR  She would like pred/zpack  I have sent to her preferred pharm  Nothing further needed

## 2023-12-07 ENCOUNTER — Other Ambulatory Visit: Payer: Self-pay

## 2023-12-07 ENCOUNTER — Encounter: Payer: Self-pay | Admitting: Cardiology

## 2023-12-07 ENCOUNTER — Other Ambulatory Visit (HOSPITAL_COMMUNITY): Payer: Self-pay

## 2023-12-07 ENCOUNTER — Other Ambulatory Visit: Payer: Self-pay | Admitting: Cardiology

## 2023-12-07 DIAGNOSIS — I4819 Other persistent atrial fibrillation: Secondary | ICD-10-CM

## 2023-12-07 MED ORDER — PIRFENIDONE 267 MG PO TABS
ORAL_TABLET | ORAL | 0 refills | Status: DC
  Filled 2023-12-07: qty 159, 30d supply, fill #0

## 2023-12-07 NOTE — Addendum Note (Signed)
 Addended by: Murrell Redden on: 12/07/2023 10:54 AM   Modules accepted: Orders

## 2023-12-07 NOTE — Progress Notes (Signed)
 Patient counseled regarding prifenidone on 12/04/2023  She had afib admission on 11/28/2023 and has started multiple new cardiac medications.   Her LFTs have now normalized and she can start pirfenidone. Denies any further workup needed based on ultrasound.   No drug interactions between pirfenidone and medication list - updated med list as needed based on recent medical changes.   Repeat LFTs in 2 weeks x 2 , then monthly x 5 ,then every 3 months

## 2023-12-07 NOTE — Progress Notes (Signed)
 Specialty Pharmacy Initial Fill Coordination Note  Erin Li is a 83 y.o. female contacted today regarding initial fill of specialty medication(s) Pirfenidone   Patient requested Delivery   Delivery date: 12/11/23   Verified address: [address]   [place] [address]   Medication will be filled on 12/10/23.   Patient is enrolled into a grant and is aware of $0 copayment.

## 2023-12-08 ENCOUNTER — Other Ambulatory Visit (HOSPITAL_COMMUNITY): Payer: Self-pay | Admitting: Physician Assistant

## 2023-12-10 ENCOUNTER — Other Ambulatory Visit: Payer: Self-pay

## 2023-12-18 DIAGNOSIS — R739 Hyperglycemia, unspecified: Secondary | ICD-10-CM | POA: Diagnosis not present

## 2023-12-18 DIAGNOSIS — E559 Vitamin D deficiency, unspecified: Secondary | ICD-10-CM | POA: Diagnosis not present

## 2023-12-18 DIAGNOSIS — Z0000 Encounter for general adult medical examination without abnormal findings: Secondary | ICD-10-CM | POA: Diagnosis not present

## 2023-12-18 DIAGNOSIS — D649 Anemia, unspecified: Secondary | ICD-10-CM | POA: Diagnosis not present

## 2023-12-18 DIAGNOSIS — E7800 Pure hypercholesterolemia, unspecified: Secondary | ICD-10-CM | POA: Diagnosis not present

## 2023-12-18 DIAGNOSIS — I10 Essential (primary) hypertension: Secondary | ICD-10-CM | POA: Diagnosis not present

## 2023-12-18 LAB — LAB REPORT - SCANNED
A1c: 6.2
EGFR: 68

## 2023-12-25 ENCOUNTER — Encounter: Payer: Self-pay | Admitting: Cardiology

## 2023-12-25 ENCOUNTER — Other Ambulatory Visit: Payer: Self-pay

## 2023-12-25 DIAGNOSIS — I4891 Unspecified atrial fibrillation: Secondary | ICD-10-CM | POA: Diagnosis not present

## 2023-12-25 DIAGNOSIS — Z0000 Encounter for general adult medical examination without abnormal findings: Secondary | ICD-10-CM | POA: Diagnosis not present

## 2023-12-25 DIAGNOSIS — I509 Heart failure, unspecified: Secondary | ICD-10-CM | POA: Diagnosis not present

## 2023-12-25 DIAGNOSIS — G720 Drug-induced myopathy: Secondary | ICD-10-CM | POA: Diagnosis not present

## 2023-12-25 DIAGNOSIS — E7800 Pure hypercholesterolemia, unspecified: Secondary | ICD-10-CM | POA: Diagnosis not present

## 2023-12-25 DIAGNOSIS — E559 Vitamin D deficiency, unspecified: Secondary | ICD-10-CM | POA: Diagnosis not present

## 2023-12-25 DIAGNOSIS — J449 Chronic obstructive pulmonary disease, unspecified: Secondary | ICD-10-CM | POA: Diagnosis not present

## 2023-12-25 DIAGNOSIS — J84112 Idiopathic pulmonary fibrosis: Secondary | ICD-10-CM | POA: Diagnosis not present

## 2023-12-25 DIAGNOSIS — I10 Essential (primary) hypertension: Secondary | ICD-10-CM | POA: Diagnosis not present

## 2023-12-25 DIAGNOSIS — M810 Age-related osteoporosis without current pathological fracture: Secondary | ICD-10-CM | POA: Diagnosis not present

## 2023-12-25 DIAGNOSIS — I2510 Atherosclerotic heart disease of native coronary artery without angina pectoris: Secondary | ICD-10-CM | POA: Diagnosis not present

## 2023-12-26 ENCOUNTER — Other Ambulatory Visit (HOSPITAL_COMMUNITY): Payer: Self-pay

## 2023-12-26 ENCOUNTER — Other Ambulatory Visit: Payer: Self-pay

## 2023-12-26 NOTE — Progress Notes (Signed)
 Specialty Pharmacy Refill Coordination Note  Erin Li is a 83 y.o. female contacted today regarding refills of specialty medication(s) Pirfenidone .  Patient requested (Patient-Rptd) Delivery   Delivery date: (Patient-Rptd) 01/08/24   Verified address: (Patient-Rptd) [address] [place],  [address]   Medication will be filled on 01/07/24.

## 2024-01-02 ENCOUNTER — Other Ambulatory Visit: Payer: Self-pay | Admitting: Cardiology

## 2024-01-02 DIAGNOSIS — I5022 Chronic systolic (congestive) heart failure: Secondary | ICD-10-CM

## 2024-01-07 ENCOUNTER — Other Ambulatory Visit: Payer: Self-pay

## 2024-01-08 ENCOUNTER — Other Ambulatory Visit (HOSPITAL_COMMUNITY): Payer: Self-pay

## 2024-01-08 ENCOUNTER — Other Ambulatory Visit: Payer: Self-pay

## 2024-01-11 ENCOUNTER — Encounter: Payer: Medicare Other | Admitting: Internal Medicine

## 2024-01-11 NOTE — Progress Notes (Signed)
 This encounter was created in error - please disregard.

## 2024-01-11 NOTE — Patient Instructions (Addendum)
 ICD-10-CM   1. IPF (idiopathic pulmonary fibrosis) (HCC)  U98.119 Pulmonary function test    2. 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  But ever so slightly since 2015 it is slowly gotten worse.  Continuied worsening JAn 2025 CT chest At conference the concern is that this Is IPF   -Did not qualify for clinical trial on account of combined emphysema  -  Glad yo uare now intersted in approved anti-fibrotic - wil start with low dose protocol   Plan -- start esbriet  low dose protocl - (max 2 pills three times daily) - check LFT   RLL lung ndoule - dec 2023 =- sstable x 10 days  - stable x 10 years  Plan  - no  further followup   Emphysema   Plan    Plan -Return to see APP in 6-8 weeks to monitor esbreit uptake  -Symptom questionnaire and walking desaturation test at follow-up

## 2024-01-13 ENCOUNTER — Encounter: Payer: Self-pay | Admitting: Internal Medicine

## 2024-01-15 ENCOUNTER — Ambulatory Visit: Attending: Cardiology | Admitting: Cardiology

## 2024-01-15 ENCOUNTER — Encounter: Payer: Self-pay | Admitting: Cardiology

## 2024-01-15 VITALS — BP 116/71 | HR 73 | Resp 16 | Ht 65.0 in | Wt 137.4 lb

## 2024-01-15 DIAGNOSIS — D6869 Other thrombophilia: Secondary | ICD-10-CM | POA: Diagnosis not present

## 2024-01-15 DIAGNOSIS — I480 Paroxysmal atrial fibrillation: Secondary | ICD-10-CM

## 2024-01-15 DIAGNOSIS — I5022 Chronic systolic (congestive) heart failure: Secondary | ICD-10-CM

## 2024-01-15 DIAGNOSIS — I739 Peripheral vascular disease, unspecified: Secondary | ICD-10-CM

## 2024-01-15 DIAGNOSIS — I4819 Other persistent atrial fibrillation: Secondary | ICD-10-CM

## 2024-01-15 DIAGNOSIS — I5020 Unspecified systolic (congestive) heart failure: Secondary | ICD-10-CM

## 2024-01-15 DIAGNOSIS — E7800 Pure hypercholesterolemia, unspecified: Secondary | ICD-10-CM

## 2024-01-15 MED ORDER — SOTALOL HCL 80 MG PO TABS: 80.0000 mg | ORAL_TABLET | Freq: Every day | ORAL | 2 refills | Status: DC

## 2024-01-15 MED ORDER — VALSARTAN 40 MG PO TABS
40.0000 mg | ORAL_TABLET | Freq: Every evening | ORAL | 2 refills | Status: AC
Start: 2024-01-15 — End: ?

## 2024-01-15 MED ORDER — VALSARTAN 40 MG PO TABS: 40.0000 mg | ORAL_TABLET | Freq: Every evening | ORAL | 0 refills | Status: DC

## 2024-01-15 MED ORDER — SOTALOL HCL 80 MG PO TABS: 80.0000 mg | ORAL_TABLET | Freq: Every day | ORAL | 1 refills | Status: DC

## 2024-01-15 NOTE — Progress Notes (Unsigned)
 Cardiology Office Note:  .   Date:  01/17/2024  ID:  Erin Li, DOB [birth year], MRN [id number] PCP: Pete Brand, DO  [facility] Providers Cardiologist:  Knox Perl, MD   History of Present Illness: Erin Li   Erin Li is a 83 y.o. Patient with acute on chronic diastolic heart failure, new onset atrial fibrillation with RVR on 10/17/2023, COPD exacerbation, CAD with remote angioplasty details not available, was started on sotalol  on November 14, 2023 and was scheduled for direct-current cardioversion which was successful on 11/28/2023.  In view of symptoms suggestive claudication, she also underwent lower extremity arterial duplex and abdominal aortic duplex and now presents for follow-up.  Patient states that she is doing well and has not had any further palpitations, states that her energy level is also improved, does not think that she is back in atrial fibrillation.  Dyspnea has remained stable.  Continues to have weakness in her lower extremities and also hips and arthritis.  No rest pain.  No ulcerations.  Accompanied by her husband.  Labs   Lab Results  Component Value Date   CHOL 128 10/19/2023   HDL 39 (L) 10/19/2023   LDLCALC 78 10/19/2023   TRIG 56 10/19/2023   CHOLHDL 3.3 10/19/2023   Lab Results  Component Value Date   NA 144 11/14/2023   K 4.3 11/14/2023   CO2 19 (L) 11/14/2023   GLUCOSE 115 (H) 11/14/2023   BUN 22 11/14/2023   CREATININE 1.12 (H) 11/14/2023   CALCIUM  9.4 11/14/2023   GFR 80.50 09/22/2022   EGFR 68.0 12/18/2023   GFRNONAA 59 (L) 10/31/2023      Latest Ref Rng & Units 11/14/2023   10:14 AM 10/31/2023    3:22 PM 10/23/2023    2:43 AM  BMP  Glucose 70 - 99 mg/dL 324  401  96   BUN 8 - 27 mg/dL 22  14  30    Creatinine 0.57 - 1.00 mg/dL 0.27  2.53  6.64   BUN/Creat Ratio 12 - 28 20     Sodium 134 - 144 mmol/L 144  140  140   Potassium 3.5 - 5.2 mmol/L 4.3  3.9  4.4   Chloride 96 - 106 mmol/L 108  108  105   CO2 20 - 29 mmol/L 19   26  23    Calcium  8.7 - 10.3 mg/dL 9.4  8.7  8.9       Latest Ref Rng & Units 11/14/2023   10:14 AM 10/23/2023    2:43 AM 10/21/2023    2:21 AM  CBC  WBC 3.4 - 10.8 x10E3/uL 8.7  7.6  8.2   Hemoglobin 11.1 - 15.9 g/dL 40.3  47.4  25.9   Hematocrit 34.0 - 46.6 % 40.6  41.4  38.7   Platelets 150 - 450 x10E3/uL 285  319  259    No results found for: "HGBA1C"  Lab Results  Component Value Date   TSH 0.738 10/18/2023    ROS  Review of Systems  Cardiovascular:  Positive for claudication (bilateral hip) and dyspnea on exertion (chronic). Negative for chest pain and leg swelling.    Physical Exam:   VS:  BP 116/71 (BP Location: Left Arm, Patient Position: Sitting, Cuff Size: Normal)   Pulse 73   Resp 16   Ht 5\' 5"  (1.651 m)   Wt 137 lb 6.4 oz (62.3 kg)   LMP 09/04/1988 (Approximate)   SpO2 95%   BMI 22.86 kg/m  Wt Readings from Last 3 Encounters:  01/15/24 137 lb 6.4 oz (62.3 kg)  11/20/23 144 lb 9.6 oz (65.6 kg)  11/14/23 147 lb 9.6 oz (67 kg)    Physical Exam Neck:     Vascular: No carotid bruit or JVD.  Cardiovascular:     Rate and Rhythm: Normal rate and regular rhythm.     Pulses: Intact distal pulses.          Popliteal pulses are 0 on the right side and 0 on the left side.       Dorsalis pedis pulses are 1+ on the right side and 0 on the left side.       Posterior tibial pulses are 1+ on the right side and 1+ on the left side.     Heart sounds: Normal heart sounds. No murmur heard.    No gallop.  Pulmonary:     Effort: Pulmonary effort is normal.     Breath sounds: Normal breath sounds.  Abdominal:     General: Bowel sounds are normal.     Palpations: Abdomen is soft.  Musculoskeletal:     Right lower leg: No edema.     Left lower leg: No edema.    Studies Reviewed: Erin Li    ECHOCARDIOGRAM COMPLETE 10/18/2023  1. Left ventricular ejection fraction, by estimation, is 40 to 45%. The left ventricle has mildly decreased function. The left ventricle demonstrates  global hypokinesis. Left ventricular diastolic parameters are consistent with Grade II diastolic dysfunction (pseudonormalization). 2. Right ventricular systolic function is mildly reduced. The right ventricular size is mildly enlarged. There is moderately elevated pulmonary artery systolic pressure. The estimated right ventricular systolic pressure is 59.3 mmHg. 3. Left atrial size was moderately dilated. 4. Right atrial size was moderately dilated. 5. The mitral valve is normal in structure. 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 No evidence of mitral stenosis. Moderate mitral annular calcification.   Abdominal aortic duplex and lower extremity arterial duplex with ABI 11/29/2023: Multiphasic waveform pattern in abdominal aorta and both right and left common iliac artery. No evidence of abdominal attic aneurysm.  Abdominal aortic atherosclerosis.  Bilateral iliac arteries without evidence of focal stenosis. Right lower extremity:  Atherosclerosis of the right lower extremity arteries. 30-49% stenosis noted in the common femoral artery. Left lower extremity: Atherosclerosis of the left lower extremity arteries. 50-74% stenosis noted in the prox/mid superficial femoral artery. Occlusion of the distal posterior tibial artery. ABI/TBI: Right-1.02/0.50; Left- 0.87/0.36  EKG:    EKG Interpretation Date/Time:  Tuesday Jan 15 2024 16:25:15 EDT Ventricular Rate:  92 PR Interval:  132 QRS Duration:  72 QT Interval:  378 QTC Calculation: 467 R Axis:   32  Text Interpretation: EKG 01/15/2024: Normal sinus rhythm at the rate of 92 bpm, normal axis, poor R wave progression, cannot exclude anterior infarct old.  Low-voltage complexes.  Pulmonary disease pattern.  Normal QT interval.  Compared to 11/28/2023, heart rate has increased from 58 bpm to the present 92 bpm. Confirmed by Ceola Para, Jagadeesh ([id number]) on 01/15/2024 4:27:34 PM    Medications and allergies    Allergies   Allergen Reactions   Simvastatin  Other (See Comments)    Severe myalgia eve3n with 10 mg daily   Digoxin  And Related Other (See Comments)    Red palms   Metoprolol  Tartrate     Other Reaction(s): 2nd degree HB     Current Outpatient Medications:    apixaban  (ELIQUIS ) 5 MG TABS tablet, Take 1 tablet (  5 mg total) by mouth 2 (two) times daily., Disp: 180 tablet, Rfl: 0   Ascorbic Acid (VITAMIN C) 1000 MG tablet, Take 1,000 mg by mouth daily., Disp: , Rfl:    b complex vitamins tablet, Take 2 tablets by mouth daily., Disp: , Rfl:    bisoprolol  (ZEBETA ) 5 MG tablet, Take 1.5 tablets (7.5 mg total) by mouth daily., Disp: 135 tablet, Rfl: 0   calcium -vitamin D  250-100 MG-UNIT per tablet, Take 1 tablet by mouth daily. , Disp: , Rfl:    docusate sodium  (COLACE) 100 MG capsule, Take 1 capsule (100 mg total) by mouth 2 (two) times daily., Disp: 10 capsule, Rfl: 0   fluticasone  furoate-vilanterol (BREO ELLIPTA ) 100-25 MCG/ACT AEPB, inhale ONE PUFF into THE lungs EVERY DAY, Disp: 60 each, Rfl: 11   furosemide  (LASIX ) 40 MG tablet, TAKE 1 TABLET BY MOUTH DAILY AS NEEDED, Disp: 90 tablet, Rfl: 1   glucosamine-chondroitin 500-400 MG tablet, Take 1 tablet by mouth every morning., Disp: , Rfl:    guaiFENesin  (MUCINEX ) 600 MG 12 hr tablet, Take 600 mg by mouth 2 (two) times daily., Disp: , Rfl:    JARDIANCE  10 MG TABS tablet, Take 1 tablet (10 mg total) by mouth daily before breakfast., Disp: 30 tablet, Rfl: 11   LUTEIN PO, Take 1 tablet by mouth daily., Disp: , Rfl:    magnesium  30 MG tablet, Take 30 mg by mouth daily in the afternoon., Disp: , Rfl:    Melatonin 10 MG TABS, Take 10 mg by mouth at bedtime., Disp: , Rfl:    omeprazole (PRILOSEC) 20 MG capsule, Take 20 mg by mouth daily., Disp: , Rfl:    OVER THE COUNTER MEDICATION, Take 2 mg by mouth daily in the afternoon. Biotin 2 mg, Disp: , Rfl:    OVER THE COUNTER MEDICATION, Place 1 drop into both eyes daily as needed (dry eye). OTC eye drop, Disp: ,  Rfl:    PAPAYA PO, Take 3 tablets by mouth daily as needed (heartburn)., Disp: , Rfl:    Pirfenidone  267 MG TABS, Take 2 tablets (534 mg total) by mouth 3 (three) times daily with meals., Disp: 180 tablet, Rfl: 4   Pirfenidone  267 MG TABS, Take 1 tab three times daily for 7 days, then 2 tabs three times daily thereafter **low dose as maintenance**, Disp: 159 tablet, Rfl: 0   potassium chloride  SA (KLOR-CON  M) 20 MEQ tablet, TAKE 2 TABLETS (40 MEQ TOTAL) BY MOUTH DAILY AS NEEDED (WHEN YOU TAKE FUROSEMIDE )., Disp: 180 tablet, Rfl: 1   predniSONE  (DELTASONE ) 10 MG tablet, 4 x 1 day, 3 x 1 day, 2 x 1 day, 1 x 1 day, 1/2 x 1 day then stop, Disp: 11 tablet, Rfl: 0   Red Yeast Rice 600 MG TABS, Take 2,200 mg by mouth every evening., Disp: , Rfl:    selenium 50 MCG TABS, Take 50 mcg by mouth daily., Disp: , Rfl:    Tiotropium Bromide  Monohydrate (SPIRIVA  RESPIMAT) 2.5 MCG/ACT AERS, Inhale 2 puffs into the lungs daily., Disp: 12 g, Rfl: 1   vitamin B-12 (CYANOCOBALAMIN) 1000 MCG tablet, Take 1,000 mcg by mouth daily., Disp: , Rfl:    vitamin E 200 UNIT capsule, Take 200 Units by mouth daily., Disp: , Rfl:    sotalol  (BETAPACE ) 80 MG tablet, Take 1 tablet (80 mg total) by mouth daily., Disp: 90 tablet, Rfl: 2   valsartan  (DIOVAN ) 40 MG tablet, Take 1 tablet (40 mg total) by mouth every evening. Pt  has an office visit in May. Please keep your scheduled appt for further refills, Disp: 90 tablet, Rfl: 2   Meds ordered this encounter  Medications   DISCONTD: sotalol  (BETAPACE ) 80 MG tablet    Sig: Take 1 tablet (80 mg total) by mouth daily.    Dispense:  30 tablet    Refill:  1    This prescription was filled on 12/07/2023. Any refills authorized will be placed on file.   DISCONTD: valsartan  (DIOVAN ) 40 MG tablet    Sig: Take 1 tablet (40 mg total) by mouth every evening. Pt has an office visit in May. Please keep your scheduled appt for further refills    Dispense:  30 tablet    Refill:  0    Pt has an  office visit in May. Please keep your scheduled appt for further refills   valsartan  (DIOVAN ) 40 MG tablet    Sig: Take 1 tablet (40 mg total) by mouth every evening. Pt has an office visit in May. Please keep your scheduled appt for further refills    Dispense:  90 tablet    Refill:  2   sotalol  (BETAPACE ) 80 MG tablet    Sig: Take 1 tablet (80 mg total) by mouth daily.    Dispense:  90 tablet    Refill:  2    This prescription was filled on 12/07/2023. Any refills authorized will be placed on file.     Medications Discontinued During This Encounter  Medication Reason   azithromycin  (ZITHROMAX ) 250 MG tablet Completed Course   simvastatin  (ZOCOR ) 10 MG tablet Side effect (s)   sotalol  (BETAPACE ) 80 MG tablet Reorder   valsartan  (DIOVAN ) 40 MG tablet Reorder   sotalol  (BETAPACE ) 80 MG tablet Reorder   valsartan  (DIOVAN ) 40 MG tablet Reorder     ASSESSMENT AND PLAN: .      ICD-10-CM   1. Chronic heart failure with mildly reduced ejection fraction (HFmrEF, 41-49%) (HCC)  I50.22 EKG 12-Lead    ECHOCARDIOGRAM COMPLETE    2. Paroxysmal atrial fibrillation (HCC)  I48.0 sotalol  (BETAPACE ) 80 MG tablet    ECHOCARDIOGRAM COMPLETE    DISCONTINUED: sotalol  (BETAPACE ) 80 MG tablet    3. 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 PAD (peripheral artery disease) (HCC)  I73.9     5. Hypercholesteremia  E78.00       Assessment and Plan  1. Chronic heart failure with mildly reduced ejection fraction (HFmrEF, 41-49%) (HCC) (Primary) Patient is presently doing well we will repeat echocardiogram to follow-up on LVEF.  She is maintaining sinus rhythm on sotalol . - EKG 12-Lead - ECHOCARDIOGRAM COMPLETE; Future  2. Paroxysmal atrial fibrillation (HCC) Continue sotalol  and also Eliquis  in view of high chads vascular score and embolic risk. - sotalol  (BETAPACE ) 80 MG tablet; Take 1 tablet (80 mg total) by mouth daily.  Dispense: 90 tablet;  Refill: 2 - ECHOCARDIOGRAM COMPLETE; Future  3. 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 PAD (peripheral artery disease) (HCC) Findings suggest significant PAD in the left lower extremity/left SFA but does not explain her left hip pain.  Continue medical therapy.If left leg pain continues we can consider peripheral arteriogram..  5. Hypercholesteremia Reviewed her lipids, LDL is near goal, she is 83 years of age, presently on 1 capsule of red yeast rice.  She could not tolerate any of the statins and she is not interested in PCSK9  inhibitors.  I have advised her to increase red yeast rice to 2 capsules at night.  Other orders - valsartan  (DIOVAN ) 40 MG tablet; Take 1 tablet (40 mg total) by mouth every evening. Pt has an office visit in May. Please keep your scheduled appt for further refills  Dispense: 90 tablet; Refill: 2  She is otherwise stable from cardiac standpoint.  Patient had a planned trip to flight to [place] sometime in August 2025, in view of her medical issues,'s COPD, atrial fibrillation, heart failure, frailty, I do not think she would be a good candidate to travel and I have advised against travel.  Aug 22nd [place]   Signed,  Knox Perl, MD, [facility] 01/17/2024, 5:10 PM [facility] [address] [place], [place] [address] Phone: [phone]. Fax:  [phone]

## 2024-01-15 NOTE — Patient Instructions (Addendum)
 Medication Instructions:  Your physician has recommended you make the following change in your medication: Stop Simvastatin   *If you need a refill on your cardiac medications before your next appointment, please call your pharmacy*  Lab Work: none If you have labs (blood work) drawn today and your tests are completely normal, you will receive your results only by: MyChart Message (if you have MyChart) OR A paper copy in the mail If you have any lab test that is abnormal or we need to change your treatment, we will call you to review the results.  Testing/Procedures: Your physician has requested that you have an echocardiogram in 6 months. . Echocardiography is a painless test that uses sound waves to create images of your heart. It provides your doctor with information about the size and shape of your heart and how well your heart's chambers and valves are working. This procedure takes approximately one hour. There are no restrictions for this procedure. Please do NOT wear cologne, perfume, aftershave, or lotions (deodorant is allowed). Please arrive 15 minutes prior to your appointment time.  Please note: We ask at that you not bring children with you during ultrasound (echo/ vascular) testing. Due to room size and safety concerns, children are not allowed in the ultrasound rooms during exams. Our front office staff cannot provide observation of children in our lobby area while testing is being conducted. An adult accompanying a patient to their appointment will only be allowed in the ultrasound room at the discretion of the ultrasound technician under special circumstances. We apologize for any inconvenience.   Follow-Up: At [facility], you and your health needs are our priority.  As part of our continuing mission to provide you with exceptional heart care, our providers are all part of one team.  This team includes your primary Cardiologist (physician) and Advanced Practice  Providers or APPs (Physician Assistants and Nurse Practitioners) who all work together to provide you with the care you need, when you need it.  Your next appointment:   6 month(s) after echo  Provider:   Knox Perl, MD    We recommend signing up for the patient portal called "MyChart".  Sign up information is provided on this After Visit Summary.  MyChart is used to connect with patients for Virtual Visits (Telemedicine).  Patients are able to view lab/test results, encounter notes, upcoming appointments, etc.  Non-urgent messages can be sent to your provider as well.   To learn more about what you can do with MyChart, go to [email or web address].   Other Instructions

## 2024-01-17 ENCOUNTER — Encounter: Payer: Self-pay | Admitting: Cardiology

## 2024-01-22 DIAGNOSIS — M5451 Vertebrogenic low back pain: Secondary | ICD-10-CM | POA: Diagnosis not present

## 2024-01-23 ENCOUNTER — Encounter: Payer: Self-pay | Admitting: Internal Medicine

## 2024-01-23 MED ORDER — PREDNISONE 10 MG PO TABS: ORAL_TABLET | ORAL | 0 refills | Status: DC

## 2024-01-23 MED ORDER — DOXYCYCLINE HYCLATE 100 MG PO TABS: 100.0000 mg | ORAL_TABLET | Freq: Two times a day (BID) | ORAL | 0 refills | Status: DC

## 2024-01-23 NOTE — Telephone Encounter (Signed)
 Please advise as pt is requesting Prednisone  and ABX

## 2024-01-23 NOTE — Telephone Encounter (Signed)
 Yes I will send in Doxycycline  100mg  twice daily x 7 days and prednisone  taper for acute bronchitis vs ILD flare. Monitor for fever recurrence. If respiratory symptoms or hemoptysis worsen please present to ED.

## 2024-01-25 ENCOUNTER — Other Ambulatory Visit: Payer: Self-pay

## 2024-01-25 ENCOUNTER — Other Ambulatory Visit (HOSPITAL_COMMUNITY): Payer: Self-pay

## 2024-01-25 NOTE — Progress Notes (Signed)
 Specialty Pharmacy Refill Coordination Note  Erin Li is a 83 y.o. female contacted today regarding refills of specialty medication(s) Pirfenidone    Patient requested Delivery   Delivery date: 01/31/24   Verified address: [address] [place], [place] [address]   Medication will be filled on 01/30/24.

## 2024-01-29 DIAGNOSIS — M5451 Vertebrogenic low back pain: Secondary | ICD-10-CM | POA: Diagnosis not present

## 2024-01-31 ENCOUNTER — Other Ambulatory Visit (HOSPITAL_COMMUNITY): Payer: Self-pay

## 2024-02-05 DIAGNOSIS — M5451 Vertebrogenic low back pain: Secondary | ICD-10-CM | POA: Diagnosis not present

## 2024-02-21 ENCOUNTER — Other Ambulatory Visit: Payer: Self-pay

## 2024-02-22 ENCOUNTER — Other Ambulatory Visit: Payer: Self-pay

## 2024-02-22 ENCOUNTER — Encounter (INDEPENDENT_AMBULATORY_CARE_PROVIDER_SITE_OTHER): Payer: Self-pay

## 2024-02-22 ENCOUNTER — Other Ambulatory Visit (HOSPITAL_COMMUNITY): Payer: Self-pay

## 2024-02-22 NOTE — Progress Notes (Signed)
 Specialty Pharmacy Refill Coordination Note  MyChart Questionnaire Submission  Erin Li is a 83 y.o. female contacted today regarding refills of specialty medication(s) Pirfenidone .  Patient requested: (Patient-Rptd) Delivery   Delivery date: 02/27/24   Verified address: (Patient-Rptd) [address] [place] [address]  Medication will be filled on 02/26/24.

## 2024-02-26 ENCOUNTER — Other Ambulatory Visit: Payer: Self-pay

## 2024-02-27 ENCOUNTER — Other Ambulatory Visit: Payer: Self-pay | Admitting: Internal Medicine

## 2024-03-06 ENCOUNTER — Ambulatory Visit: Admitting: Internal Medicine

## 2024-03-06 DIAGNOSIS — J84112 Idiopathic pulmonary fibrosis: Secondary | ICD-10-CM

## 2024-03-06 DIAGNOSIS — J439 Emphysema, unspecified: Secondary | ICD-10-CM

## 2024-03-06 LAB — PULMONARY FUNCTION TEST
DL/VA % pred: 54 %
DL/VA: 2.19 ml/min/mmHg/L
DLCO unc % pred: 43 %
DLCO unc: 8.35 ml/min/mmHg
FEF 25-75 Pre: 0.81 L/s
FEF2575-%Pred-Pre: 57 %
FEV1-%Pred-Pre: 77 %
FEV1-Pre: 1.55 L
FEV1FVC-%Pred-Pre: 88 %
FEV6-%Pred-Pre: 91 %
FEV6-Pre: 2.32 L
FEV6FVC-%Pred-Pre: 102 %
FVC-%Pred-Pre: 88 %
FVC-Pre: 2.39 L
Pre FEV1/FVC ratio: 65 %
Pre FEV6/FVC Ratio: 97 %

## 2024-03-06 NOTE — Patient Instructions (Signed)
Spiro/DLCO performed today. 

## 2024-03-06 NOTE — Progress Notes (Signed)
Spiro/DLCO performed today. 

## 2024-03-11 ENCOUNTER — Encounter: Payer: Self-pay | Admitting: Internal Medicine

## 2024-03-11 DIAGNOSIS — J441 Chronic obstructive pulmonary disease with (acute) exacerbation: Secondary | ICD-10-CM

## 2024-03-12 MED ORDER — PREDNISONE 10 MG PO TABS: ORAL_TABLET | ORAL | 0 refills | Status: DC

## 2024-03-12 MED ORDER — DOXYCYCLINE HYCLATE 100 MG PO TABS: ORAL_TABLET | ORAL | 0 refills | Status: DC

## 2024-03-12 NOTE — Telephone Encounter (Signed)
 Pirfenidone  should not caues green spputum Likely flare up   Allergies  Allergen Reactions   Simvastatin  Other (See Comments)    Severe myalgia eve3n with 10 mg daily   Digoxin  And Related Other (See Comments)    Red palms   Metoprolol  Tartrate     Other Reaction(s): 2nd degree HB     Plan  - Take doxycycline  100mg  po twice daily x 5 days; take after meals and avoid sunlight - Please take prednisone  40 mg x1 day, then 30 mg x1 day, then 20 mg x1 day, then 10 mg x1 day, and then 5 mg x1 day and stop  Sent

## 2024-03-13 ENCOUNTER — Telehealth: Payer: Self-pay

## 2024-03-13 NOTE — Telephone Encounter (Signed)
 Called the pt to go over recommendations and there was no answer- LMTCB.

## 2024-03-13 NOTE — Telephone Encounter (Signed)
 Copied from CRM [id number]. Topic: General - Other >> Mar 13, 2024  2:26 PM Rilla B wrote: Reason for CRM:  Patient returning a call to [facility], please call.  Handled in Mychart msg.NFN

## 2024-03-13 NOTE — Telephone Encounter (Signed)
 Patient was called, she verbalized understanding and no concern. Message sent to patient's Mychart.

## 2024-03-24 ENCOUNTER — Encounter: Payer: Self-pay | Admitting: Internal Medicine

## 2024-03-24 DIAGNOSIS — R053 Chronic cough: Secondary | ICD-10-CM

## 2024-03-24 DIAGNOSIS — J84112 Idiopathic pulmonary fibrosis: Secondary | ICD-10-CM

## 2024-03-24 DIAGNOSIS — J441 Chronic obstructive pulmonary disease with (acute) exacerbation: Secondary | ICD-10-CM

## 2024-03-24 NOTE — Telephone Encounter (Signed)
 Hmm.. Please arrange for   CXR Sputum gram stain and culture  Please let patien tknow and then order  Thanks    SIGNATURE    Dr. Dorethia Cave, M.D., F.C.C.P,  Pulmonary and Critical Care Medicine Staff Physician, [facility] Director - Interstitial Lung Disease  Program  Pulmonary Fibrosis [facility] at [facility] [place], [place], [address]   Pager: [phone], If no answer  -> Check AMION or Try [phone] Telephone (clinical office): [phone] Telephone (research): [phone]  4:51 PM 03/24/2024

## 2024-03-25 ENCOUNTER — Encounter: Payer: Self-pay | Admitting: Cardiology

## 2024-03-25 ENCOUNTER — Ambulatory Visit (INDEPENDENT_AMBULATORY_CARE_PROVIDER_SITE_OTHER)

## 2024-03-25 DIAGNOSIS — R053 Chronic cough: Secondary | ICD-10-CM | POA: Diagnosis not present

## 2024-03-25 DIAGNOSIS — J84112 Idiopathic pulmonary fibrosis: Secondary | ICD-10-CM

## 2024-03-25 DIAGNOSIS — J441 Chronic obstructive pulmonary disease with (acute) exacerbation: Secondary | ICD-10-CM

## 2024-03-25 DIAGNOSIS — R918 Other nonspecific abnormal finding of lung field: Secondary | ICD-10-CM | POA: Diagnosis not present

## 2024-03-25 DIAGNOSIS — R059 Cough, unspecified: Secondary | ICD-10-CM | POA: Diagnosis not present

## 2024-03-25 NOTE — Telephone Encounter (Signed)
 I have placed orders  Spoke with the pt and notified of recommendations  She will come in for cxr this afternoon and will get sputum cup with instructions  Nothing further needed

## 2024-03-26 ENCOUNTER — Encounter: Payer: Self-pay | Admitting: Cardiology

## 2024-03-27 ENCOUNTER — Telehealth: Payer: Self-pay | Admitting: Internal Medicine

## 2024-03-27 NOTE — Telephone Encounter (Signed)
  Chest x-ray is not fully explaining her persistent symptoms.  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  Therefore can she be seen next week or the week after in the office either by myself or one of the nurse practitioners or another physician is acute thank you   DG Chest 2 View Result Date: 03/25/2024 CLINICAL DATA:  Cough EXAM: CHEST - 2 VIEW COMPARISON:  Chest radiograph dated 10/20/2023 FINDINGS: Normal lung volumes. No focal consolidations. Bilateral pulmonary nodules are better evaluated on prior CT. Trace blunting of bilateral costophrenic angles. No pneumothorax. The heart size and mediastinal contours are within normal limits. No acute osseous abnormality. IMPRESSION: Trace blunting of bilateral costophrenic angles, which may represent trace pleural effusions or pleural thickening. Electronically Signed   By: Limin  Xu M.D.   On: 03/25/2024 13:46

## 2024-03-28 ENCOUNTER — Other Ambulatory Visit

## 2024-03-28 DIAGNOSIS — R053 Chronic cough: Secondary | ICD-10-CM | POA: Diagnosis not present

## 2024-03-28 DIAGNOSIS — J441 Chronic obstructive pulmonary disease with (acute) exacerbation: Secondary | ICD-10-CM

## 2024-03-28 DIAGNOSIS — J84112 Idiopathic pulmonary fibrosis: Secondary | ICD-10-CM

## 2024-03-28 NOTE — Telephone Encounter (Signed)
 Sched for Thursday 7/31 w/Dr. R. 15 min.

## 2024-03-29 ENCOUNTER — Other Ambulatory Visit (HOSPITAL_COMMUNITY): Payer: Self-pay

## 2024-03-31 ENCOUNTER — Other Ambulatory Visit: Payer: Self-pay

## 2024-03-31 LAB — RESPIRATORY CULTURE OR RESPIRATORY AND SPUTUM CULTURE
MICRO NUMBER:: 16746613
SPECIMEN QUALITY:: ADEQUATE

## 2024-03-31 NOTE — Progress Notes (Signed)
 Specialty Pharmacy Refill Coordination Note  Erin Li is a 83 y.o. female contacted today regarding refills of specialty medication(s) Pirfenidone    Patient requested Delivery   Delivery date: 04/02/24   Verified address: [address], [place], [address]   Medication will be filled on 04/01/24.

## 2024-04-03 ENCOUNTER — Ambulatory Visit: Admitting: Internal Medicine

## 2024-04-03 ENCOUNTER — Encounter: Payer: Self-pay | Admitting: Internal Medicine

## 2024-04-03 VITALS — BP 108/64 | HR 89 | Temp 97.9°F | Ht 65.0 in | Wt 133.4 lb

## 2024-04-03 DIAGNOSIS — J84112 Idiopathic pulmonary fibrosis: Secondary | ICD-10-CM

## 2024-04-03 DIAGNOSIS — J439 Emphysema, unspecified: Secondary | ICD-10-CM | POA: Diagnosis not present

## 2024-04-03 DIAGNOSIS — J441 Chronic obstructive pulmonary disease with (acute) exacerbation: Secondary | ICD-10-CM | POA: Diagnosis not present

## 2024-04-03 DIAGNOSIS — Z5181 Encounter for therapeutic drug level monitoring: Secondary | ICD-10-CM | POA: Diagnosis not present

## 2024-04-03 DIAGNOSIS — Z2239 Carrier of other specified bacterial diseases: Secondary | ICD-10-CM

## 2024-04-03 MED ORDER — CIPROFLOXACIN HCL 750 MG PO TABS: 750.0000 mg | ORAL_TABLET | Freq: Two times a day (BID) | ORAL | 0 refills | Status: DC

## 2024-04-03 NOTE — Progress Notes (Signed)
 OV 04/10/2017   Chief Complaint  Patient presents with   Follow-up    Pt states her breathing is unchanged since last OV. Pt states she has mild non prod cough. Pt denies CP/tightness, f/c/s, and chest congestion.    Follow-up COPD but now has concerned that she has developed interstitial lung disease  She is here with her husband. Back in 2015 a high-resolution CT chest because I thought I heard crackles and thoracic radiology ruled out interstitial lung disease. In the interim she's had lung cancer screening CT chest at overall stability in her pulmonary function test. But there was concern because of crackles that she might have interstitial lung disease so we did a high-resolution CT chest and July 2018 and thoracic radiologist reported as definite precedence of ILD although it is not a UIP pattern. [name] She does say that her son and over the lung transplant in [place] 8 years ago at age of 56 because of IPF. She did have autoimmune panel that is only trace positive for ENP . 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 She denies any humidifier or mildew or mold in the house. The CT chest also shows coronary artery calcification. Of note I personally visualized the CT chest.  ACCP ILD questiin  - symptioms: occ cough x 2 years with some night cough and mild phleogn ona nd off. Very mild non troubling dyspnea x 3 years - pasth medical : GERD +, mild dysphagia + - personal exposure x -: +ver for remote use of rec / street drugs. Cig smoked age 53 though 85 at 57 cigs/dat - fam hx: COPD +, son with transplant for ?  IPF at age 3 - home exposurE: does have hot tube - uses it once a month. 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 Range 04/03/2017 10:18  CK Total Latest Ref Range: 7 - 177 U/L 63  CK-MB Latest Ref Range: 0.3 - 4.0 ng/mL 2.4  Aldolase Latest Ref Range: <=8.1 U/L 3.5  Sed Rate Latest Ref Range: 0 - 30 mm/hr 20  Anit Nuclear Antibody(ANA) Latest Ref Range: NEGATIVE  NEG  Angiotensin-Converting Enzyme Latest Ref Range: 9 - 67 U/L 67  Cyclic Citrullin Peptide Ab Latest Units: Units <16  ds DNA Ab Latest Units: IU/mL 1  ENA RNP Ab Latest Ref Range: 0.0 - 0.9 AI 1.4 (H)  RA Latex Turbid. Latest Ref Range: <14 IU/mL <14  SSA (Ro) (ENA) Antibody, IgG Latest Ref Range: <1.0 NEG AI  <1.0 NEG  SSB (La) (ENA) Antibody, IgG Latest Ref Range: <1.0 NEG AI  <1.0 NEG  Scleroderma (Scl-70) (ENA) Antibody, IgG Latest Ref Range: <1.0 NEG AI  <1.0 NEG    OV 09/28/2017  Chief Complaint  Patient presents with   Follow-up    PFT done today. States she has been doing good and has no complaints of cough, SOB, or CP.    Fu mixed empyhsema and pulmonary fibrosis  For  years of only known who is a COPD patient with a family history of pulmonary fibrosis.  But it appears that in 2018 she started developing signs of pulmonary fibrosis with  crackles and findings of indeterminate UIP on CT chest July 2018.  She has family history of pulmonary fibrosis and that her son had lung transplant for the same.  She now presents for follow-up.  In the interim she started feeling better.  She is less dyspneic.  She continues with the Brio.  There are no new issues.  Pulmonary function test done as documented below shows an improvement.  Therefore instead of biopsy she feels keeping an eye on this is a better approach.    Walking desaturation test on 09/28/2017 185 feet x 3 laps on ROOM AIR:  did NOT desaturate. Rest pulse ox was 99%, final pulse ox was96 %. HR response 78 /min at rest to 105/min at peak exertion. Patient [name]  Did nto Desaturate < 88% . [name] yes  Desaturated </= 3% points.  [name] yes did get tachyardic   OV 01/01/2018  Chief Complaint  Patient presents with   Follow-up    PFT done today.  Pt states she has been doing good since last visit. Had a cold mid January 2019 but has been better since. Denies any complaints of cough, SOB, or CP.   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 She continues on Brio. Since last visit January 2019 she continues to feel better. COPD cat score is very minimal as documented below. She had pulmonary function test today and this shows stability in her FVC after initial improvement. Her DLCO is even better. She is on observation course regarding her ILD given the improvement in her pulmonary function test. Her last CT scan of the chest was July 2018        OV 07/09/2018  Subjective:  Patient ID: Erin Li, female , DOB: May 18, 1941 , age 83 y.o. , MRN: [id number] , ADDRESS: [address] [place] [address]   07/09/2018 -   Chief Complaint  Patient presents with   Consult    Alot of coughing producing mucus no color at this time.     HPI Erin Li 83 y.o. 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  She tells me that for the last 3 weeks has had increased cough with congestion and white sputum more than baseline but activities of daily living and shortness of breath or guarding is all unchanged and she has good effort tolerance.  There are no new other issues.  In terms of ILD: Last visit I thought I will be in improved based on improvement in pulmonary function test but actually that might of been a red herring because today's pulmonary function test shows a decline which is in line with previous pulmonary function test showing stability.  She certainly feels stable.  In  July 2019 she had a low-dose CT scan for lung cancer and this ruled out lung cancer on screening.  They were able to report some mildly changes at the base.  She is not interested in surgical lung biopsy because her son who had pulmonary fibrosis finally died from 8 years of transplant from pulmonary embolism, stroke and acute renal failure and chronic critical illness in the rehab facility.      OV 10/08/2019  Subjective:  Patient ID: Erin Li, female , DOB: 07/16/1941 , age 56  y.o. , MRN: [id number] , ADDRESS: [address] [place] [address]   10/08/2019 -   Chief Complaint  Patient presents with   Follow-up    Pt states she has been doing well since last visit and states her breathing has been doing well. Pt will have an occ cough.   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  Last seen in #2019.  She presents with her husband.  Since the onset of the COVID-19 pandemic there have been in the house and socially isolated.  She has been doing well without any problems overall stable.  She not exercising as much as she used to.  She not taking any inhalers for her COPD.  She only takes Mucinex .  Therefore her cough symptom with mucus is slightly worse than before.  She had high-resolution CT scan of the chest which I reviewed the report and agree with the findings and I interpreted as well with visualization: She has emphysema associated with alternate diagnosis pattern.  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  She still grieving from her son who died in 09/20/19from pulmonary fibrosis.    OV 06/01/2020   Subjective:  Patient ID: Erin Li, female , DOB: 08-17-1941, age 89 y.o. years. , MRN: [id number],  ADDRESS: [address] [place]  [address] PCP  Luke Agent, MD Providers : Treatment Team:  Attending Provider: Geronimo Amel, MD   Chief Complaint  Patient presents with   Follow-up    ILD, no concerns    predominantly COPD with some amount of basal pulmonary fibrosis  N(atlernate to UIP)  in the setting of her son having pulmonary fibrosis alt- decieasedd 2019   HPI Erin Li 83 y.o. -presents for follow-up.  Last seen February 2021.  Since then she is doing stable.  She continues to be stable.  Her symptom scores are very minimal and shows stability.  She had pulmonary function test August 2021 and compared to November 2019 she is stable.  Her walking desaturation test is also stable.  Her last CT scan was January 2021.  She prefers to have monitoring for pulmonary fibrosis because of her son's issue.  She likes the strategy of alternating pulmonary function test and also CT scan of the chest.      OV 12/06/2021  Subjective:  Patient ID: Erin Li, female , DOB: 1940/10/14 , age 20 y.o. , MRN: [id number] , ADDRESS: [address] [place] [address] PCP Luke Agent, MD Patient Care Team: Luke Agent, MD as PCP - General (Internal Medicine)  This Provider for this visit: Treatment Team:  Attending Provider: Geronimo Amel, MD   Follow-up emphysema with some amount of ILD.  Last seen in 2020/05/24.  It is now 18 months since I last saw her.  Doing well overall.  Last month she did have a flareup requiring antibiotics and prednisone  for COPD exacerbation.  She continues on Breo.  No new health issues.  April 23, 2022 she plans to go to [place].  She is wanting antibiotic and prednisone .  Her last pulmonary function test was a year and a half ago.  Her last CT scan of the chest was a year ago or more.  She is agreeable to have follow-up reassessment done.  Symptom score is as below and stable.  Walking desaturation test shows a tendency to desaturate but still acceptable.    OV  05/18/2022   Subjective:  Patient ID: Erin Li, female , DOB: 01-07-1941, age 24 y.o. years. , MRN: [id number],  ADDRESS: [address] [place] [address] PCP  Gerome Brunet, DO Providers : Treatment Team:  Attending Provider: Geronimo Amel, MD Patient Care Team: Gerome Brunet, DO as PCP - General (Family Medicine)    Chief Complaint  Patient presents with   Follow-up    PFT performed today.  Pt states she has been doing okay since last visit. States for the past 2 weeks she has been coughing a lot getting up white phlegm and states cough is worse at night.    Follow-up emphysema with some amount of ILD   HPI Erin Li 40 y.o. -[son also has ILD].  Returns for follow-up.  In August 2023 she went to [place] with her female side of the family.  She return May 02, 2022.  She had a great time.  No issues walking around the.  Then after coming back for the last 2 weeks she has respiratory symptoms particularly increased cough for the last 2 to 3 weeks associated with white sputum occasionally yellow.  Definitely worse than baseline.  Moderate in severity and but no fever.  No chills no worsening shortness of breath.  No wheezing.  No edema no orthopnea no chills.  However pulmonary function test today shows a decline [see below].  Despite this increase in symptoms her subjective symptom score below is stable.  Of note her husband is here with her today.    PFT   Narrative & Impression  CLINICAL DATA:  Interstitial lung disease   EXAM: CT CHEST WITHOUT CONTRAST   TECHNIQUE: Multidetector CT imaging of the chest was performed following the standard protocol without intravenous contrast. High resolution imaging of the lungs, as well as inspiratory and expiratory imaging, was performed.   RADIATION DOSE REDUCTION: This exam was performed according to the departmental dose-optimization program which includes automated exposure control, adjustment of the  mA and/or kV according to patient size and/or use of iterative reconstruction technique.   COMPARISON:  10/01/2020, 09/23/2019, 09/27/2018   FINDINGS: Cardiovascular: Aortic atherosclerosis. Normal heart size. Left and right coronary artery calcifications. No pericardial effusion.   Mediastinum/Nodes: No enlarged mediastinal, hilar, or axillary lymph nodes. Thyroid  gland, trachea, and esophagus demonstrate no significant findings.   Lungs/Pleura: Moderate centrilobular and paraseptal emphysema. Unchanged mild pulmonary fibrosis in a pattern with apical to basal gradient, featuring irregular peripheral interstitial opacity, septal thickening, and ground-glass, mild, tubular bronchiectasis, with small areas of subpleural bronchiolectasis at the lung bases. No significant air trapping on expiratory phase imaging. Multiple small bilateral pulmonary nodules are stable and benign, largest a 0.9 cm nodule of the superior segment right lower lobe (series 4, image 57). No pleural effusion or pneumothorax.   Upper Abdomen: No acute abnormality.   Musculoskeletal: No chest wall abnormality. No suspicious osseous lesions identified.   IMPRESSION: 1. Unchanged mild pulmonary fibrosis in a pattern with apical to basal gradient, featuring irregular peripheral interstitial opacity, septal thickening, and ground-glass, with small areas of subpleural bronchiolectasis at the lung bases. Fibrotic findings are not significantly changed compared to immediate prior examination, although as previously reported are worsened over a longer period of time dating back to at least 2015. Findings are categorized as probable UIP per consensus guidelines: Diagnosis of Idiopathic  Pulmonary Fibrosis: An Official ATS/ERS/JRS/ALAT Clinical Practice Guideline. Am JINNY Honey Crit Care Med Vol 198, Iss 5, [phone], May 05 2017. 2. Multiple small bilateral pulmonary nodules are stable and definitively benign. 3.  Emphysema. 4. Coronary artery disease.   Aortic Atherosclerosis (ICD10-I70.0) and Emphysema (ICD10-J43.9).     Electronically Signed   By: Marolyn JONETTA Jaksch M.D.   On: 02/25/2022 17:51    No results found.     OV 08/01/2022  Subjective:  Patient ID: Erin Li, female , DOB: 09-19-1940 , age 10 y.o. , MRN: [id number] , ADDRESS: [address] [place] [address] PCP Gerome Brunet, DO Patient Care Team: Gerome Brunet, DO as PCP - General (Family Medicine)  This Provider for this visit: Treatment Team:  Attending Provider: Geronimo Amel, MD  Follow-up emphysema with some amount of ILD  08/01/2022 -   Chief Complaint  Patient presents with   Follow-up     HPI Erin Li 83 y.o. -presents with her husband.  After the recent flareup she is better.  However she ended up with back pain and sciatica 6 weeks ago.  She got prednisone  course but it did not help.  Since then she has not acupuncture with massage therapy and she is significantly better although she still requiring a cane.  Therefore we did not walk her today.  She had a repeat pulmonary function test that shows slight improvement in FVC but the DLCO is still the same.  Her FVC over the last 5 to 8 years is range bound but the DLCO seems to taken a turn for the worse compared to several years ago.  Still the decline is not greater than 5 to 10% in the last 1 or 2 years to meet progressive phenotype definition.  I did visualize the CT scan currently with and also compared it to the old one.  There is definitely more ILD findings now compared to 2015 but is reported she does not meet the classic definition of progressive phenotype.  The pet bird she had is now deceased for the last 5 years.  Her son himself had IPF/ILDand had lung transplant several years ago.  She is willing to see genetics counselor.  Her husband prefers an aggressive approach with potential antifibrotic's because of the slow progression in  the future course being uncertain.  She prefers a more wait and watch approach.  We took a shared decision making to discuss in case conference repeat pulmonary function test and then decide    Husband is particularly worried about chronic cough.  Is particular worse in the night.  Is been going on for a long time but worse in the last 6 months definitely much worse at night.  She taking fish oil for many years but recently she has also been having heartburn.  There is no wheezing.  He is worried if it could be related to ILD.  Xxxxx[id number]  DEC 2023 Brief History: MR patient for > 18 years. Followed as gold stage 2 copd. but had bird that died 5 years ago. Son had transplant for IPF in [place] 10-15 years ago. She even was in copd trial.  Ratios always obstrucive with fev1 1.4L/67% and current DLCO 10.  Several years ago heard crackles in lung base but CT always read as no ILD and just atelectasis. Now CT says ILD and worse. FVC in 2018 - 2.2-2.38L -> now 2.44 DLC in 2018- 11.5-12.5 -> now 10.8 Does not meet Progr Phenotype definition  for ILD (> 5-10% decline in 1-2 years) Questions:  Does she have ILD  Is this UIP  Would you biopsy OR Rx OR watch?  iscussion synopsis:  2018 -> june 2023: Ches CT bibasalar ILD (TB, GGO, subplerua reticualtion, No HC. No air trapping). Per Poff - not progressing.  in retrospect even in 2018 ILD +.. In 2015: ILA. Definite progression since 2015.  NEW RLL NODULAR OPACITY - June 2023 reported in conference but not in officia report - What is the final conclusion per 2018 ATS/Fleischner Criteria - PROBABLE UIP  - Concordance with official report: DISCORDANT - Conference has picked up a new RLL NODULE  Pathology discussion of biopsy NO:    MDD Impression/Recs: 1) nodule - consier PET/ repeat CT 2) Prob UIP (fam hx,  emphysema, age > 65,s eroplogy) = IPF -> Rx with antifibotic. 3) emphysema + -> currently ILD > emphysema but barely      IMPRESSION: Dominant nodule in the superior segment of the right lower lobe has been present since 2014 consistent with a benign process. Other nodules also demonstrated long-term presence consistent with benign process.   Underlying emphysematous lung changes. Interstitial process also seen as on the prior high-resolution chest CT scan.     Electronically Signed   By: Ranell Bring M.D.   On: 09/05/2022 18:24 OV 09/22/2022  Subjective:  Patient ID: Erin Li, female , DOB: October 18, 1940 , age 35 y.o. , MRN: [id number] , ADDRESS: [address] [place] [address] PCP Gerome Brunet, DO Patient Care Team: Gerome Brunet, DO as PCP - General (Family Medicine)  This Provider for this visit: Treatment Team:  Attending Provider: Geronimo Amel, MD    09/22/2022 -   Chief Complaint  Patient presents with   Follow-up    Pft review,      HPI Erin Li 83 y.o. -returns for follow-up.  At this visit she is here with her husband.  He is acting as an independent historian.  She tells me that overall she is stable but she is having chronic cough.  She is quite bothered by the chronic cough.  She tells me that over the last year or 2 it slowly gotten worse but since last visit it is stable.  Nighttime cough is worse than daytime.  It does wake her husband up.  She does bring junk.  It is clear but sometimes slightly yellow-tinged.  There is no wheezing.  Happening despite Breo.  Happening despite stopping fish oil.  Her shortness of breath is stable.  We discussed several things today  -Chronic cough: Okay to restart fish oil but will also check blood allergy panel and CBC with differential -COPD: Is stable.  She will continue inhaler therapy  -Right lower lobe lung nodule: This was parted during the case conference.  We did a super D CT chest and the current radiologist has said it has been stable for 10 years.  No further follow-up  -Pulmonary fibrosis: The  concern at the conference is that she has IPF based on probable UIP on the CT chest, slow emergence and progression and also son having pulmonary fibrosis IPF.  Last serologies showed trace ANA positive but that was 10 years ago.  She is agreeable for repeat.  We discussed therapeutic options including standard of care nintedanib.  She said that many years ago she did have a cardiac cath and she did have a blockage but no stent was placed or angioplasty was done.  We also discussed standard of care pirfenidone  and the side effects.  We then discussed a modified pirfenidone  through a clinical trial [facility].  The side effect profile here is less but for the first 6 months she could get placebo standard of care pirfenidone  or the study drug.  Then after 6 months should be randomized to different doses of the study drug which research is on show for his one third less side effect profile than standard of care pirfenidone .  She is more interested in this option.SABRA  However we do need to get serologies.   FEB 2025   10/31/2023 -   Chief Complaint  Patient presents with   Hospitalization Follow-up    Hospitalized from 2/12- 10/23/23 new onset AFib. Breathing has improved back to her baseline. She has occ cough with yellow to white sputum.      HPI Erin Li 83 y.o. -returns for follow-up for combined pulmonary fibrosis emphysema.  I last saw her in November 2024.  Then based on husband's story he is an independent historian and also her story and also review of the external medical record she was discharged 10/23/2023 after admission for atrial fibrillation.  She is now on Eliquis  and bisoprolol .  Shortness of breath was worsening before the A-fib admission but is now back to baseline.  Her symptoms are minimal.  Her exercise hypoxemia test is stable.  However she did tell me that she had a COPD flareup while in the hospital.  She now wants antibiotic and prednisone  handy.  I did give her a  prescription for this doxycycline  and short course prednisone  with a refill.  Because of this admission she did not have pulmonary function test but she did have a high-resolution CT chest that I personally visualized and showed it to her and her husband compared to the high-resolution 10 years ago she definitely has frank ILD right now it is probable UIP I agree with the radiologist.  There is progression.  It is still mild in severity and she is not feeling it.  However it is there and it has progressed.  Her son had transplant from fibrosis.  In the past she had declined antifibrotic's but we went over this again.  Went over the inflation reduction act and the total co-pay burden.  Went over pirfenidone  and it side effect profile.  Based on all this she is willing to try the low-dose protocol.  We did discuss the risk benefits and limitations.   Esbriet /Pirfenidone  requires intensive drug monitoring due to high concerns for Adverse effects of , including  Drug Induced Liver Injury, significant GI side effects that include but not limited to Diarrhea, Nausea, Vomiting,  and other system side effects that include Fatigue, headaches, weight loss and other side effects such as skin rash. These will be monitored with  blood work such as LFT initially once a month for 6 months and then quarterly        CT Chest data from date: Jan 2024  - personally visualized and independently interpreted : yes - my findings are: as below Narrative & Impression  CLINICAL DATA:  83 year old female with history of chronic interstitial lung disease.   EXAM: CT CHEST WITHOUT CONTRAST   TECHNIQUE: Multidetector CT imaging of the chest was performed following the standard protocol without intravenous contrast. High resolution imaging of the lungs, as well as inspiratory and expiratory imaging, was performed.   RADIATION DOSE REDUCTION: This exam was performed according to the departmental  dose-optimization  program which includes automated exposure control, adjustment of the mA and/or kV according to patient size and/or use of iterative reconstruction technique.   COMPARISON:  High-resolution chest CT 02/25/2022.   FINDINGS: Cardiovascular: Heart size is normal. There is no significant pericardial fluid, thickening or pericardial calcification. There is aortic atherosclerosis, as well as atherosclerosis of the great vessels of the mediastinum and the coronary arteries, including calcified atherosclerotic plaque in the left main, left anterior descending, left circumflex and right coronary arteries. Calcifications of the aortic valve and mitral annulus.   Mediastinum/Nodes: No pathologically enlarged mediastinal or hilar lymph nodes. Please note that accurate exclusion of hilar adenopathy is limited on noncontrast CT scans. Esophagus is unremarkable in appearance. No axillary lymphadenopathy.   Lungs/Pleura: High-resolution images again demonstrate widespread but patchy areas of ground-glass attenuation, septal thickening, thickening of the peribronchovascular interstitium, cylindrical bronchiectasis and peripheral bronchiolectasis most evident throughout the mid to lower lungs bilaterally. These findings appear mildly progressive compared to the prior examination. No frank honeycombing confidently identified at this time. Inspiratory and expiratory imaging demonstrates some mild air trapping indicative of mild small airways disease. No acute consolidative airspace disease. No pleural effusions. In the superior segment of the right lower lobe (axial image 68 of series 4) there is a 10 x 8 mm pulmonary nodule which is stable compared to prior examinations. No other larger more suspicious appearing pulmonary nodules or masses are noted. Moderate centrilobular and paraseptal emphysema.   Upper Abdomen: Aortic atherosclerosis.   Musculoskeletal: Multiple old healed bilateral rib  fractures. There are no aggressive appearing lytic or blastic lesions noted in the visualized portions of the skeleton.   IMPRESSION: 1. 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 Mild progression of disease compared to the prior study. 2. There is also moderate centrilobular and paraseptal emphysema. 3. Aortic atherosclerosis, in addition to left main and three-vessel coronary artery disease. 4. There are calcifications of the aortic valve and mitral annulus. Echocardiographic correlation for evaluation of potential valvular dysfunction may be warranted if clinically indicated.   Aortic Atherosclerosis (ICD10-I70.0) and Emphysema (ICD10-J43.9).     Electronically Signed   By: Toribio Aye M.D.   On: 10/06/2023 09:42       OV 04/03/2024  Subjective:  Patient ID: Erin Li, female , DOB: 11-Sep-1940 , age 83 y.o. , MRN: [id number] , ADDRESS: [address] [place] [address] PCP Gerome Brunet, DO Patient Care Team: Gerome Brunet, DO as PCP - General (Family Medicine) Ladona Heinz, MD as PCP - Cardiology (Cardiology)  This Provider for this visit: Treatment Team:  Attending Provider: Geronimo Amel, MD    04/03/2024 -   Chief Complaint  Patient presents with   Acute Visit    Pt states this is a regular checkup Productive cough for a couple months with green/yellow phlegm. Worst at night.     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   - Son with interstitial lung disease and status post transplant died 04-24-2018 approx - AGreed to do low dose esbreit Feb 2025  HPI Erin Li 83 y.o.  acue visit. She had called in for aecopd. Rx with doxuy and pred but not improved. Got CXR - non revelaing. Got sputum culture - growing spedumonas (new first  time). Still having lot of cough with yellow bown  sputum. Also esbreit low dose protocl and comlaints of tiredness, low motivation , ftiague, dysguesia. Husband repors she coughs till 3am. And is very irritable  PFT stable thugh dlco down Sit stand test stable Dyspnea stable Cough worse Fatigue worse    SYMPTOM SCALE -  06/01/2020  12/06/2021  05/18/2022 Copd exac  08/01/2022  07/12/2023  10/31/2023  04/03/2024 Esbietr low dose  New pse pseudmonasas  O2 use ra ra0 ra ra ra ra ra  Shortness of Breath 0 -> 5 scale with 5 being worst (score 6 If unable to do) 0     0  At rest 0 0 0 0 0 0 0  Simple tasks - showers, clothes change, eating, shaving 0 00 0 0 0 0 0  Household (dishes, doing bed, laundry) 0  0 0 0 0 0  Shopping 0 0 0 0 0 0 0  Walking level at own pace 0 0 0 0 0 0 0  Walking up Stairs 2 2 2 3 3 2 2   Total (30-36) Dyspnea Score 2 2 2 3 3 2 2   How bad is your cough? 2 3 3  0 3 4 5   How bad is your fatigue 0 0 0 00 00 2 1  How bad is nausea 00 0 0 0 0 0 0  How bad is vomiting?  0 0 0 0 0 0 0  How bad is diarrhea? 0 0 0 0 0 0 0  How bad is anxiety? 0 0 0 0 0 0 0  How bad is depression 0 0 0 0 0 0 0  ra ra  Simple office walk 185 feet x  3 laps goal with forehead probe 12/06/2021  12/07/2022  07/12/2023  10/31/2023  04/03/2024   O2 used ra ra ra ra   Number laps completed 3 3 Sit stand x 10-15 Sitt and stand x 15   Comments about pace avg      Resting Pulse Ox/HR 97% and 91/min 100% and HR 72 93% and HR 79 97% and HR 70   Final Pulse Ox/HR 93% and 112/min 94% and HR 101 90% and HR 101 93% and HR 125   Desaturated </= 88% no no  no   Desaturated <= 3% points yes Yes, 6  yes   Got Tachycardic >/= 90/min yes yes  yes   Symptoms at end of test No complaints N compaints  No complaint   Miscellaneous comments x   x        SIT STAND TEST - goal 15 times   04/03/2024    O2 used ra   PRobe - finter or forehead finger   Number sit and stand completed - goal 15 15   Time  taken to complete 43 sec   Resting Pulse Ox/HR/Dyspnea  98% and 82/min and dyspnea of 0/10    Peak measures 95 % and and dyspnea of 0/10   Final Pulse Ox/HR 94% and 83/min and dyspnea of 0/10   Desaturated </= 88% no   Desaturated <= 3% points yes   Got Tachycardic >/= 90/min no   Miscellaneous comments Seeems sable      PFT     Latest Ref Rng & Units 03/06/2024    8:06 AM 12/04/2022    8:48 AM 09/22/2022   11:43 AM 08/01/2022    9:17 AM 05/18/2022    3:08 PM 04/06/2020   10:48 AM 07/09/2018   12:49 PM  PFT Results  FVC-Pre  L 2.39  2.47  2.40  2.44  2.17  2.38  2.33   FVC-Predicted Pre % 88  90  87  87  78  83  79   Pre FEV1/FVC % % 65  62  59  57  65  65  59   FEV1-Pre L 1.55  1.53  1.41  1.40  1.41  1.55  1.38   FEV1-Predicted Pre % 77  75  69  67  67  72  62   DLCO uncorrected ml/min/mmHg 8.35  11.27  10.61  10.86  10.58  13.11  12.18   DLCO UNC% % 43  57  54  55  54  66  47   DLCO corrected ml/min/mmHg  11.27  10.61  10.86  10.58  13.11    DLCO COR %Predicted %  57  54  55  54  66    DLVA Predicted % 54  65  66  64  63  73  57        LAB RESULTS last 96 hours No results found.       has a past medical history of COPD (chronic obstructive pulmonary disease) (HCC), Coronary artery disease, Diverticulosis, HTN (hypertension), and Lung nodule.   reports that she quit smoking about 12 years ago. Her smoking use included cigarettes. She started smoking about 62 years ago. She has a 37.5 pack-year smoking history. She has never used smokeless tobacco.  Past Surgical History:  Procedure Laterality Date   CARDIAC CATHETERIZATION     CARDIOVERSION N/A 11/28/2023   Procedure: CARDIOVERSION;  Surgeon: Mona Vinie BROCKS, MD;  Location: [facility];  Service: Cardiovascular;  Laterality: N/A;   CORONARY ANGIOPLASTY     FRACTURE SURGERY     INTRAMEDULLARY (IM) NAIL INTERTROCHANTERIC Right 02/26/2022   Procedure: INTRAMEDULLARY (IM) NAIL INTERTROCHANTRIC;  Surgeon:  Cristy Bonner DASEN, MD;  Location: [facility] OR;  Service: Orthopedics;  Laterality: Right;   TONSILLECTOMY AND ADENOIDECTOMY      Allergies  Allergen Reactions   Simvastatin  Other (See Comments)    Severe myalgia eve3n with 10 mg daily   Digoxin  And Related Other (See Comments)    Red palms   Esbriet  [Pirfenidone ]     Fatigue, loss of taste   Metoprolol  Tartrate     Other Reaction(s): 2nd degree HB    Immunization History  Administered Date(s) Administered   Fluad Quad(high Dose 65+) 06/20/2022   Influenza Split 06/04/2013, 05/05/2014   Influenza Whole 05/28/2012   Influenza, High Dose Seasonal PF 06/05/2016, 06/04/2017, 05/20/2018, 05/06/2019   Influenza, Quadrivalent, Recombinant, Inj, Pf 05/25/2021   Influenza,inj,Quad PF,6+ Mos 06/05/2015   Influenza,trivalent, recombinat, inj, PF 06/19/2023   Moderna Sars-Covid-2 Vaccination 11/10/2019, 12/08/2019, 06/30/2020, 01/11/2021, 08/02/2021   PNEUMOCOCCAL CONJUGATE-20 11/17/2020   Pfizer(Comirnaty)Fall Seasonal Vaccine 12 years and older 06/19/2023   Pneumococcal Conjugate-13 01/26/2015   Pneumococcal Polysaccharide-23 09/04/2005, 09/24/2012   Tdap 03/20/2012    Family History  Problem Relation Age of Onset   COPD Mother    Heart attack Father    Kidney disease Father    Lung cancer Brother 17   Pulmonary fibrosis Son        had lung transplant     Current Outpatient Medications:    apixaban  (ELIQUIS ) 5 MG TABS tablet, Take 1 tablet (5 mg total) by mouth 2 (two) times daily., Disp: 180 tablet, Rfl: 0   Ascorbic Acid (VITAMIN C) 1000 MG tablet, Take 1,000 mg by mouth daily.,  Disp: , Rfl:    b complex vitamins tablet, Take 2 tablets by mouth daily., Disp: , Rfl:    BREO ELLIPTA  100-25 MCG/ACT AEPB, inhale ONE PUFF into THE lungs EVERY DAY, Disp: 60 each, Rfl: 11   calcium -vitamin D  250-100 MG-UNIT per tablet, Take 1 tablet by mouth daily. , Disp: , Rfl:    ciprofloxacin  (CIPRO ) 750 MG tablet, Take 1 tablet (750 mg total) by mouth 2  (two) times daily., Disp: 28 tablet, Rfl: 0   docusate sodium  (COLACE) 100 MG capsule, Take 1 capsule (100 mg total) by mouth 2 (two) times daily., Disp: 10 capsule, Rfl: 0   doxycycline  (VIBRA -TABS) 100 MG tablet, Take doxycycline  100mg  po twice daily x 5 days; take after meals and avoid sunlight, Disp: 10 tablet, Rfl: 0   furosemide  (LASIX ) 40 MG tablet, TAKE 1 TABLET BY MOUTH DAILY AS NEEDED, Disp: 90 tablet, Rfl: 1   glucosamine-chondroitin 500-400 MG tablet, Take 1 tablet by mouth every morning., Disp: , Rfl:    guaiFENesin  (MUCINEX ) 600 MG 12 hr tablet, Take 600 mg by mouth 2 (two) times daily., Disp: , Rfl:    JARDIANCE  10 MG TABS tablet, Take 1 tablet (10 mg total) by mouth daily before breakfast., Disp: 30 tablet, Rfl: 11   LUTEIN PO, Take 1 tablet by mouth daily., Disp: , Rfl:    magnesium  30 MG tablet, Take 30 mg by mouth daily in the afternoon., Disp: , Rfl:    Melatonin 10 MG TABS, Take 10 mg by mouth at bedtime., Disp: , Rfl:    omeprazole (PRILOSEC) 20 MG capsule, Take 20 mg by mouth daily., Disp: , Rfl:    OVER THE COUNTER MEDICATION, Take 2 mg by mouth daily in the afternoon. Biotin 2 mg, Disp: , Rfl:    OVER THE COUNTER MEDICATION, Place 1 drop into both eyes daily as needed (dry eye). OTC eye drop, Disp: , Rfl:    PAPAYA PO, Take 3 tablets by mouth daily as needed (heartburn)., Disp: , Rfl:    Pirfenidone  267 MG TABS, Take 2 tablets (534 mg total) by mouth 3 (three) times daily with meals., Disp: 180 tablet, Rfl: 4   Pirfenidone  267 MG TABS, Take 1 tab three times daily for 7 days, then 2 tabs three times daily thereafter **low dose as maintenance**, Disp: 159 tablet, Rfl: 0   potassium chloride  SA (KLOR-CON  M) 20 MEQ tablet, TAKE 2 TABLETS (40 MEQ TOTAL) BY MOUTH DAILY AS NEEDED (WHEN YOU TAKE FUROSEMIDE )., Disp: 180 tablet, Rfl: 1   predniSONE  (DELTASONE ) 10 MG tablet, 4 tabs for 2 days, then 3 tabs for 2 days, 2 tabs for 2 days, then 1 tab for 2 days, then stop, Disp: 20  tablet, Rfl: 0   Red Yeast Rice 600 MG TABS, Take 2,200 mg by mouth every evening., Disp: , Rfl:    selenium 50 MCG TABS, Take 50 mcg by mouth daily., Disp: , Rfl:    sotalol  (BETAPACE ) 80 MG tablet, Take 1 tablet (80 mg total) by mouth daily., Disp: 90 tablet, Rfl: 2   Tiotropium Bromide  Monohydrate (SPIRIVA  RESPIMAT) 2.5 MCG/ACT AERS, Inhale 2 puffs into the lungs daily., Disp: 12 g, Rfl: 1   valsartan  (DIOVAN ) 40 MG tablet, Take 1 tablet (40 mg total) by mouth every evening. Pt has an office visit in May. Please keep your scheduled appt for further refills, Disp: 90 tablet, Rfl: 2   vitamin B-12 (CYANOCOBALAMIN) 1000 MCG tablet, Take 1,000 mcg by mouth daily.,  Disp: , Rfl:    vitamin E 200 UNIT capsule, Take 200 Units by mouth daily., Disp: , Rfl:    bisoprolol  (ZEBETA ) 5 MG tablet, Take 1.5 tablets (7.5 mg total) by mouth daily. (Patient not taking: Reported on 04/03/2024), Disp: 135 tablet, Rfl: 0      Objective:   Vitals:   04/03/24 0824  BP: 108/64  Pulse: 89  Temp: 97.9 F (36.6 C)  TempSrc: Oral  SpO2: 90%  Weight: 133 lb 6.4 oz (60.5 kg)  Height: 5' 5 (1.651 m)    Estimated body mass index is 22.2 kg/m as calculated from the following:   Height as of this encounter: 5' 5 (1.651 m).   Weight as of this encounter: 133 lb 6.4 oz (60.5 kg).  @WEIGHTCHANGE @  [employer]   04/03/24 0824  Weight: 133 lb 6.4 oz (60.5 kg)     Physical Exam   General: No distress. ra O2 at rest: no Cane present: no Sitting in wheel chair: no Frail: no Obese: no Neuro: Alert and Oriented x 3. GCS 15. Speech normal Psych: Pleasant Resp:  Barrel Chest - no.  Wheeze - no, Crackles - YES BASE, No overt respiratory distress CVS: Normal heart sounds. Murmurs - no Ext: Stigmata of Connective Tissue Disease - no HEENT: Normal upper airway. PEERL +. 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  +/- GERD (cough gets worse with chocholates) - WORSE now due to Psuedomonas colonization/flare up - did not respond to doxycycline   Plan - continue breo for emphysema - continue spiriva  - avoid fish oil -  POCiprofloxacin 750 mg bid for 14 days   Combined pulmonary fibrosis and emphysema (CPFE) (HCC) Pulmonary emphysema with fibrosis of lung (HCC)  -Clinically stable   Plan - Continue inhaler therapy as before; breo - add spiriva  as above cough section - monitor frequency of flare up; if increases can consider daliresp  ILD (interstitial lung disease) (HCC) IPF Family history of pulmonary fibrosis Long-term exposure involving bird droppings  -You have extremely mild interstitial lung disease as of 2023 predominantly in the lung base.  But ever so slightly since 2015 it is slowly gotten worse.  Continuied worsening JAn 2025 CT chest At conference the concern is that this Is IPF .   -Did not qualify for clinical trial on account of combined emphysema  -  DID NOT TOLERATE Esbriet  low dose protocol due to side effects of fatigue and altered tase   Plan --  STOP ESBRIET  - list it as allergy   RLL lung ndoule - dec 2023 =- sstable x 10 days  - stable x 10 years  Plan  - no  further followup  Plan -Keep appt May 01, 2024 with Dr Geronimo  -Symptom questionnaire and walking desaturation test at follow-up    FOLLOWUP    Return for 05/01/24 with Dr Geronimo.    SIGNATURE    Dr. Dorethia Geronimo, M.D., F.C.C.P,  Pulmonary and Critical Care Medicine Staff Physician, [facility] Director - Interstitial Lung Disease  Program  Pulmonary Fibrosis [facility] [place], [place],  [address]  Pager: [phone], If no answer or between  15:00h - 7:00h: call [phone] Telephone: [phone]  12:15 PM 04/03/2024   Moderate Complexity MDM OFFICE  2021 E/M guidelines, first released in 2021, with minor revisions added in 2023 and 2024 Must meet the requirements for 2 out of 3 dimensions to qualify.    Number and complexity of problems addressed Amount and/or complexity of data reviewed Risk of complications and/or morbidity  One or more chronic illness with mild exacerbation, OR progression, OR  side effects of treatment  Two or more stable chronic illnesses  One undiagnosed new problem with uncertain prognosis  One acute illness with systemic symptoms   One Acute complicated injury Must meet the requirements for 1 of 3 of the categories)  Category 1: Tests and documents, historian  Any combination of 3 of the following:  Assessment requiring an independent historian  Review of prior external note(s) from each unique source  Review of results of each unique test  Ordering of each unique test    Category 2: Interpretation of tests   Independent interpretation of a test performed by another physician/other qualified health care professional (not separately reported)  Category 3: Discuss management/tests  Discussion of management or test interpretation with external physician/other qualified health care professional/appropriate source (not separately reported) Moderate risk of morbidity from additional diagnostic testing or treatment Examples only:  Prescription drug management  Decision regarding minor surgery with identfied patient or procedure risk factors  Decision regarding elective major surgery without identified patient or procedure risk factors  Diagnosis or treatment significantly limited by social determinants of health             HIGh Complexity  OFFICE   2021 E/M guidelines, first released  in 2021, with minor revisions added in 2023. Must meet the requirements for 2 out of 3 dimensions to qualify.    Number and complexity of problems addressed Amount and/or complexity of data reviewed Risk of complications and/or morbidity  Severe exacerbation of chronic illness  Acute or chronic illnesses that may pose a threat to life or bodily function, e.g., multiple trauma, acute MI, pulmonary embolus, severe respiratory distress, progressive rheumatoid arthritis, psychiatric illness with potential threat to self or others, peritonitis, acute renal failure, abrupt change in neurological status Must meet the requirements for 2 of 3 of the categories)  Category 1: Tests and documents, historian  Any combination of 3 of the following:  Assessment requiring an independent historian  Review of prior external note(s) from each unique source  Review of results of each unique test  Ordering of each unique test    Category 2: Interpretation of tests    Independent interpretation of a test performed by another physician/other qualified health care professional (not separately reported)  Category 3: Discuss management/tests  Discussion of management or test interpretation with external physician/other qualified health care professional/appropriate source (not separately reported)  HIGH risk of morbidity from additional diagnostic testing or treatment Examples only:  Drug therapy requiring intensive monitoring for toxicity  Decision for elective major surgery with identified pateint or procedure risk factors  Decision regarding hospitalization or escalation of level of care  Decision for DNR or to de-escalate care   Parenteral controlled  substances            LEGEND - Independent interpretation involves the interpretation of a test for which there is a CPT code, and an interpretation or report is customary.  When a review and interpretation of a test is performed and  documented by the provider, but not separately reported (billed), then this would represent an independent interpretation. This report does not need to conform to the usual standards of a complete report of the test. This does not include interpretation of tests that do not have formal reports such as a complete blood count with differential and blood cultures. Examples would include reviewing a chest radiograph and documenting in the medical record an interpretation, but not separately reporting (billing) the interpretation of the chest radiograph.   An appropriate source includes professionals who are not health care professionals but may be involved in the management of the patient, such as a Clinical research associate, upper officer, case manager or teacher, and does not include discussion with family or informal caregivers.    - SDOH: SDOH are the conditions in the environments where people are born, live, learn, work, play, worship, and age that affect a wide range of health, functioning, and quality-of-life outcomes and risks. (e.g., housing, food insecurity, transportation, etc.). SDOH-related Z codes ranging from Z55-Z65 are the ICD-10-CM diagnosis codes used to document SDOH data Z55 - Problems related to education and literacy Z56 - Problems related to employment and unemployment Z57 - Occupational exposure to risk factors Z58 - Problems related to physical environment Z59 - Problems related to housing and economic circumstances [phone] - Problems related to social environment [phone] - Problems related to upbringing [phone] - Other problems related to primary support group, including family circumstances Z35 - Problems related to certain psychosocial circumstances Z65 - Problems related to other psychosocial circumstances

## 2024-04-03 NOTE — Patient Instructions (Addendum)
   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 and emphysema +/- cough neuropaty  +/- GERD (cough gets worse with chocholates) - WORSE now due to Psuedomonas colonization/flare up - did not respond to doxycycline   Plan - continue breo for emphysema - continue spiriva  - avoid fish oil -  POCiprofloxacin 750 mg bid for 14 days   Combined pulmonary fibrosis and emphysema (CPFE) (HCC) Pulmonary emphysema with fibrosis of lung (HCC)  -Clinically stable   Plan - Continue inhaler therapy as before; breo - add spiriva  as above cough section - monitor frequency of flare up; if increases can consider daliresp  ILD (interstitial lung disease) (HCC) IPF Family history of pulmonary fibrosis Long-term exposure involving bird droppings  -You have extremely mild interstitial lung disease as of 2023 predominantly in the lung base.  But ever so slightly since 2015 it is slowly gotten worse.  Continuied worsening JAn 2025 CT chest At conference the concern is that this Is IPF .   -Did not qualify for clinical trial on account of combined emphysema  -  DID NOT TOLERATE Esbriet  low dose protocol due to side effects of fatigue and altered tase   Plan --  STOP ESBRIET  - list it as allergy   RLL lung ndoule - dec 2023 =- sstable x 10 days  - stable x 10 years  Plan  - no  further followup  Plan -Keep appt May 01, 2024 with Dr Geronimo  -Symptom questionnaire and walking desaturation test at follow-up

## 2024-04-07 ENCOUNTER — Other Ambulatory Visit: Payer: Self-pay

## 2024-04-15 ENCOUNTER — Encounter: Payer: Self-pay | Admitting: Internal Medicine

## 2024-04-15 NOTE — Telephone Encounter (Signed)
 Patient is scheduled for office visit for tomorrow with Izetta Rouleau at [facility]

## 2024-04-15 NOTE — Telephone Encounter (Signed)
 Definitely could be a flare if no better after 14 days of Cipro , and worse will most like need ov  Do we have any slots to work in for visit?

## 2024-04-15 NOTE — Telephone Encounter (Signed)
**Note De-identified  [place] Obfuscation** Please advise 

## 2024-04-16 ENCOUNTER — Ambulatory Visit (HOSPITAL_BASED_OUTPATIENT_CLINIC_OR_DEPARTMENT_OTHER): Admitting: Nurse Practitioner

## 2024-04-16 ENCOUNTER — Encounter (HOSPITAL_BASED_OUTPATIENT_CLINIC_OR_DEPARTMENT_OTHER): Payer: Self-pay | Admitting: Nurse Practitioner

## 2024-04-16 VITALS — BP 126/72 | HR 79 | Ht 65.0 in | Wt 134.8 lb

## 2024-04-16 DIAGNOSIS — J441 Chronic obstructive pulmonary disease with (acute) exacerbation: Secondary | ICD-10-CM

## 2024-04-16 DIAGNOSIS — J849 Interstitial pulmonary disease, unspecified: Secondary | ICD-10-CM | POA: Diagnosis not present

## 2024-04-16 DIAGNOSIS — I5021 Acute systolic (congestive) heart failure: Secondary | ICD-10-CM | POA: Diagnosis not present

## 2024-04-16 DIAGNOSIS — A498 Other bacterial infections of unspecified site: Secondary | ICD-10-CM

## 2024-04-16 MED ORDER — METHYLPREDNISOLONE ACETATE 80 MG/ML IJ SUSP
80.0000 mg | Freq: Once | INTRAMUSCULAR | Status: AC
  Administered 2024-04-16 (×2): 80 mg via INTRAMUSCULAR

## 2024-04-16 MED ORDER — PROMETHAZINE-DM 6.25-15 MG/5ML PO SYRP: 5.0000 mL | ORAL_SOLUTION | Freq: Four times a day (QID) | ORAL | 0 refills | Status: DC | PRN

## 2024-04-16 MED ORDER — ALBUTEROL SULFATE (2.5 MG/3ML) 0.083% IN NEBU: 2.5000 mg | INHALATION_SOLUTION | Freq: Four times a day (QID) | RESPIRATORY_TRACT | 5 refills | Status: DC | PRN

## 2024-04-16 MED ORDER — PREDNISONE 10 MG PO TABS: ORAL_TABLET | ORAL | 0 refills | Status: DC

## 2024-04-16 NOTE — Patient Instructions (Signed)
-  Continue Breo 1 puff daily. Brush tongue and rinse mouth afterwards -Continue Spiriva  2 puffs daily -Continue Albuterol  inhaler 2 puffs or 3 mL neb every 6 hours as needed for shortness of breath or wheezing. Notify if symptoms persist despite rescue inhaler/neb use.  -Use the nebs Twice daily followed by flutter valve 10 times  -Complete antibiotic   -Increase guaifenesin  to 1200 mg Twice daily until symptoms improve then 600 mg Twice daily for cough/congestion -Prednisone  taper. 4 tabs for 3 days, then 3 tabs for 3 days, 2 tabs for 3 days, then 1 tab for 3 days, then stop. Take in AM with food. Start tomorrow -Promethazine  DM cough syrup 5 mL every 6 hours as needed for cough. May cause drowsiness. Do not drive after taking   If you aren't better by Monday, please call us  and I will order a CT scan of the chest given length of your symptoms   Follow up in 2-3 weeks with Dr. Geronimo or Izetta Malachy PIETY. If symptoms do not improve or worsen, please contact office for sooner follow up or seek emergency care.

## 2024-04-16 NOTE — Assessment & Plan Note (Signed)
 Slow to resolve AECOPD with Pseudomonas infection; pansensitive on prior culture.  Treated with ciprofloxacin  for 14 days with some improvement in infectious symptoms.  Still with paroxysmal cough and wheezing.  Will treat her with steroid taper and Depo 80 mg injection in office today.  Target mucociliary clearance therapies with guaifenesin , flutter valve and nebulized treatments.  If she fails to improve over the next few days, would recommend CT imaging for further evaluation.  ED precautions reviewed.  Close follow-up.  Action plan in place.  Continue triple therapy regimen.  Patient Instructions  -Continue Breo 1 puff daily. Brush tongue and rinse mouth afterwards -Continue Spiriva  2 puffs daily -Continue Albuterol  inhaler 2 puffs or 3 mL neb every 6 hours as needed for shortness of breath or wheezing. Notify if symptoms persist despite rescue inhaler/neb use.  -Use the nebs Twice daily followed by flutter valve 10 times  -Complete antibiotic   -Increase guaifenesin  to 1200 mg Twice daily until symptoms improve then 600 mg Twice daily for cough/congestion -Prednisone  taper. 4 tabs for 3 days, then 3 tabs for 3 days, 2 tabs for 3 days, then 1 tab for 3 days, then stop. Take in AM with food. Start tomorrow -Promethazine  DM cough syrup 5 mL every 6 hours as needed for cough. May cause drowsiness. Do not drive after taking   If you aren't better by Monday, please call us  and I will order a CT scan of the chest given length of your symptoms   Follow up in 2-3 weeks with Dr. Geronimo or Izetta Malachy PIETY. If symptoms do not improve or worsen, please contact office for sooner follow up or seek emergency care.

## 2024-04-16 NOTE — Assessment & Plan Note (Signed)
 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  She is status post cardioversion.  Has not had repeat echo.  Euvolemic on exam today.  She is in normal sinus rhythm.  May need to consider BNP and repeat echo if symptoms fail to improve.  Follow-up with cardiology

## 2024-04-16 NOTE — Assessment & Plan Note (Signed)
 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  Intolerant of Esbriet .  Continue to monitor.

## 2024-04-16 NOTE — Progress Notes (Signed)
 @Patient  ID: Erin Li, female    DOB: 09-Oct-1940, 83 y.o.   MRN: [id number]  Chief Complaint  Patient presents with   Acute Visit    Cough, wheezing x 1 week    Referring provider: Gerome Brunet, DO  HPI: 83 year old female, former smoker followed for IPF and COPD.  She is patient [name] and last seen in office 04/03/2024.  Past medical history significant for PAF on Eliquis , hypertension, HF, history of lung nodule, HLD.  TEST/EVENTS:  12/04/2022 PFT: FVC 90, FEV1 91, ratio 62, DLCO 57 09/28/2023 HRCT chest: Atherosclerosis.  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  Moderate centrilobular and paraseptal emphysema. 03/06/2024 PFT: FVC 88, FEV1 77, ratio 65, DLCO 43 03/25/2024 CXR: Trace blunting of bilateral costophrenic angles otherwise lungs are clear  04/03/2024: OV with Dr. Geronimo.  Had called in in May for acute symptoms treated with doxycycline  and prednisone .  Failed to improve.  Recent chest x-ray was nonrevealing.  Sputum culture did grow out Pseudomonas.  Still having a lot of cough with yellow-brown sputum.  Also having a lot of complaints of tiredness, low motivation, fatigue with low-dose as Breo.  Coughs most nights until 3 AM.  Very irritable.  PFT is stable. Continue Breo and Spiriva .  Started on ciprofloxacin  for 14 days.  Monitor frequency of flareups.  If increases, can consider Daliresp.  Interstitial lung disease is mild.  Has progressed since 2015.  Concern for IPF.  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  Did not tolerate Esbriet .  Advised to stop.  Prior lung nodule was stable x 10 years and no further follow-up dedicated.  04/16/2024: Today-acute Discussed the use of AI scribe software for clinical note transcription with the patient, who gave verbal consent to proceed.  History of Present Illness Erin Li is an 83 year old female who presents with persistent cough and wheezing. She is accompanied by  her husband.   She began experiencing symptoms in May and had some slight improvement but then the symptoms recurred in July, including a productive cough and colored phlegm. A sputum culture performed around July 25th identified Pseudomonas, leading to a prescription of ciprofloxacin  for two weeks, which she is completing today.  Despite initial improvement, about eight days ago, her cough has again worsened, with increased coughing and wheezing.  The wheezing seems to worsen it was before.  Her sputum production has actually decreased some and is starting to lighten up in color.  Now beige.   She has a history of using prednisone  in May, which was beneficial. Currently, she is taking Breo and Spiriva , and she has been using Mucinex . No fever or hemoptysis is reported, and her eating and drinking habits are normal. Her energy levels have improved since being on antibiotics and stopping the Esbriet .  No swelling in her feet, weight gain, chest pain.  She does not have a nebulizer machine at home and has not been using a flutter valve. No shortness of breath is reported.    Allergies  Allergen Reactions   Simvastatin  Other (See Comments)    Severe myalgia eve3n with 10 mg daily   Digoxin  And Related Other (See Comments)    Red palms   Esbriet  [Pirfenidone ]     Fatigue, loss of taste   Metoprolol  Tartrate     Other Reaction(s): 2nd degree HB    Immunization History  Administered Date(s) Administered   Fluad Quad(high Dose 65+) 06/20/2022   Influenza  Split 06/04/2013, 05/05/2014   Influenza Whole 05/28/2012   Influenza, High Dose Seasonal PF 06/05/2016, 06/04/2017, 05/20/2018, 05/06/2019   Influenza, Quadrivalent, Recombinant, Inj, Pf 05/25/2021   Influenza,inj,Quad PF,6+ Mos 06/05/2015   Influenza,trivalent, recombinat, inj, PF 06/19/2023   Moderna Sars-Covid-2 Vaccination 11/10/2019, 12/08/2019, 06/30/2020, 01/11/2021, 08/02/2021   PNEUMOCOCCAL CONJUGATE-20 11/17/2020    Pfizer(Comirnaty)Fall Seasonal Vaccine 12 years and older 06/19/2023   Pneumococcal Conjugate-13 01/26/2015   Pneumococcal Polysaccharide-23 09/04/2005, 09/24/2012   Tdap 03/20/2012    Past Medical History:  Diagnosis Date   COPD (chronic obstructive pulmonary disease) (HCC)    Coronary artery disease    Diverticulosis    HTN (hypertension)    Lung nodule     Tobacco History: Social History   Tobacco Use  Smoking Status Former   Current packs/day: 0.00   Average packs/day: 0.8 packs/day for 50.0 years (37.5 ttl pk-yrs)   Types: Cigarettes   Start date: 11/10/1961   Quit date: 11/11/2011   Years since quitting: 12.4  Smokeless Tobacco Never  Tobacco Comments   Encouraged to remain smoke free   Counseling given: Not Answered Tobacco comments: Encouraged to remain smoke free   Outpatient Medications Prior to Visit  Medication Sig Dispense Refill   apixaban  (ELIQUIS ) 5 MG TABS tablet Take 1 tablet (5 mg total) by mouth 2 (two) times daily. 180 tablet 0   Ascorbic Acid (VITAMIN C) 1000 MG tablet Take 1,000 mg by mouth daily.     b complex vitamins tablet Take 2 tablets by mouth daily.     bisoprolol  (ZEBETA ) 5 MG tablet Take 1.5 tablets (7.5 mg total) by mouth daily. 135 tablet 0   BREO ELLIPTA  100-25 MCG/ACT AEPB inhale ONE PUFF into THE lungs EVERY DAY 60 each 11   calcium -vitamin D  250-100 MG-UNIT per tablet Take 1 tablet by mouth daily.      ciprofloxacin  (CIPRO ) 750 MG tablet Take 1 tablet (750 mg total) by mouth 2 (two) times daily. 28 tablet 0   docusate sodium  (COLACE) 100 MG capsule Take 1 capsule (100 mg total) by mouth 2 (two) times daily. 10 capsule 0   doxycycline  (VIBRA -TABS) 100 MG tablet Take doxycycline  100mg  po twice daily x 5 days; take after meals and avoid sunlight 10 tablet 0   furosemide  (LASIX ) 40 MG tablet TAKE 1 TABLET BY MOUTH DAILY AS NEEDED 90 tablet 1   glucosamine-chondroitin 500-400 MG tablet Take 1 tablet by mouth every morning.     guaiFENesin   (MUCINEX ) 600 MG 12 hr tablet Take 600 mg by mouth 2 (two) times daily.     JARDIANCE  10 MG TABS tablet Take 1 tablet (10 mg total) by mouth daily before breakfast. 30 tablet 11   LUTEIN PO Take 1 tablet by mouth daily.     magnesium  30 MG tablet Take 30 mg by mouth daily in the afternoon.     Melatonin 10 MG TABS Take 10 mg by mouth at bedtime.     omeprazole (PRILOSEC) 20 MG capsule Take 20 mg by mouth daily.     OVER THE COUNTER MEDICATION Take 2 mg by mouth daily in the afternoon. Biotin 2 mg     OVER THE COUNTER MEDICATION Place 1 drop into both eyes daily as needed (dry eye). OTC eye drop     PAPAYA PO Take 3 tablets by mouth daily as needed (heartburn).     Pirfenidone  267 MG TABS Take 2 tablets (534 mg total) by mouth 3 (three) times daily with meals. 180  tablet 4   Pirfenidone  267 MG TABS Take 1 tab three times daily for 7 days, then 2 tabs three times daily thereafter **low dose as maintenance** 159 tablet 0   potassium chloride  SA (KLOR-CON  M) 20 MEQ tablet TAKE 2 TABLETS (40 MEQ TOTAL) BY MOUTH DAILY AS NEEDED (WHEN YOU TAKE FUROSEMIDE ). 180 tablet 1   Red Yeast Rice 600 MG TABS Take 2,200 mg by mouth every evening.     selenium 50 MCG TABS Take 50 mcg by mouth daily.     sotalol  (BETAPACE ) 80 MG tablet Take 1 tablet (80 mg total) by mouth daily. 90 tablet 2   Tiotropium Bromide  Monohydrate (SPIRIVA  RESPIMAT) 2.5 MCG/ACT AERS Inhale 2 puffs into the lungs daily. 12 g 1   valsartan  (DIOVAN ) 40 MG tablet Take 1 tablet (40 mg total) by mouth every evening. Pt has an office visit in May. Please keep your scheduled appt for further refills 90 tablet 2   vitamin B-12 (CYANOCOBALAMIN) 1000 MCG tablet Take 1,000 mcg by mouth daily.     vitamin E 200 UNIT capsule Take 200 Units by mouth daily.     predniSONE  (DELTASONE ) 10 MG tablet 4 tabs for 2 days, then 3 tabs for 2 days, 2 tabs for 2 days, then 1 tab for 2 days, then stop 20 tablet 0   No facility-administered medications prior to visit.      Review of Systems:   Constitutional: No weight loss or gain, night sweats, fevers, chills, fatigue, or lassitude. HEENT: No headaches, difficulty swallowing, tooth/dental problems, or sore throat. No sneezing, itching, ear ache, nasal congestion, or post nasal drip CV:  No chest pain, orthopnea, PND, swelling in lower extremities, anasarca, dizziness, palpitations, syncope Resp: +productive cough, wheezing. No shortness of breath with exertion or at rest. No hemoptysis. No chest wall deformity GI:  No heartburn, indigestion, loss of appetite Skin: No rash, lesions, ulcerations Psych: No depression or anxiety. Mood stable.     Physical Exam:  BP 126/72   Pulse 79   Ht 5' 5 (1.651 m)   Wt 134 lb 12.8 oz (61.1 kg)   LMP 09/04/1988 (Approximate)   SpO2 93%   BMI 22.43 kg/m   GEN: Pleasant, interactive, well-appearing; in no acute distress HEENT:  Normocephalic and atraumatic. PERRLA. Sclera white. Nasal turbinates pink, moist and patent bilaterally. No rhinorrhea present. Oropharynx pink and moist, without exudate or edema. No lesions, ulcerations, or postnasal drip.  NECK:  Supple w/ fair ROM. No JVD present. No lymphadenopathy.   CV: RRR, no m/r/g, no peripheral edema. Pulses intact, +2 bilaterally. No cyanosis, pallor or clubbing. PULMONARY:  Unlabored, regular breathing. Decreased bibasilar airflow and scattered wheezing bilaterally A&P. No accessory muscle use.  GI: BS present and normoactive. Soft, non-tender to palpation.  MSK: No erythema, warmth or tenderness. Cap refil <2 sec all extrem.  Neuro: A/Ox3. No focal deficits noted.   Skin: Warm, no lesions or rashe Psych: Normal affect and behavior. Judgement and thought content appropriate.     Lab Results:  CBC    Component Value Date/Time   WBC 8.7 11/14/2023 1014   WBC 7.6 10/23/2023 0243   RBC 4.13 11/14/2023 1014   RBC 4.26 10/23/2023 0243   HGB 13.2 11/14/2023 1014   HCT 40.6 11/14/2023 1014   PLT 285  11/14/2023 1014   MCV 98 (H) 11/14/2023 1014   MCH 32.0 11/14/2023 1014   MCH 32.4 10/23/2023 0243   MCHC 32.5 11/14/2023 1014   MCHC  33.3 10/23/2023 0243   RDW 12.5 11/14/2023 1014   LYMPHSABS 1.2 10/17/2023 0805   MONOABS 0.8 10/17/2023 0805   EOSABS 0.2 10/17/2023 0805   BASOSABS 0.0 10/17/2023 0805    BMET    Component Value Date/Time   NA 144 11/14/2023 1014   K 4.3 11/14/2023 1014   CL 108 (H) 11/14/2023 1014   CO2 19 (L) 11/14/2023 1014   GLUCOSE 115 (H) 11/14/2023 1014   GLUCOSE 107 (H) 10/31/2023 1522   BUN 22 11/14/2023 1014   CREATININE 1.12 (H) 11/14/2023 1014   CALCIUM  9.4 11/14/2023 1014   GFRNONAA 59 (L) 10/31/2023 1522   GFRAA >60 08/12/2016 1156    BNP    Component Value Date/Time   BNP 464.6 (H) 10/31/2023 1510     Imaging:  DG Chest 2 View Result Date: 03/25/2024 CLINICAL DATA:  Cough EXAM: CHEST - 2 VIEW COMPARISON:  Chest radiograph dated 10/20/2023 FINDINGS: Normal lung volumes. No focal consolidations. Bilateral pulmonary nodules are better evaluated on prior CT. Trace blunting of bilateral costophrenic angles. No pneumothorax. The heart size and mediastinal contours are within normal limits. No acute osseous abnormality. IMPRESSION: Trace blunting of bilateral costophrenic angles, which may represent trace pleural effusions or pleural thickening. Electronically Signed   By: Limin  Xu M.D.   On: 03/25/2024 13:46    methylPREDNISolone  acetate (DEPO-MEDROL ) injection 80 mg     Date Action Dose Route User   04/16/2024 1623 Given 80 mg Intramuscular (Left Quadriceps) Trudy Warren CROME, CMA          Latest Ref Rng & Units 03/06/2024    8:06 AM 12/04/2022    8:48 AM 09/22/2022   11:43 AM 08/01/2022    9:17 AM 05/18/2022    3:08 PM 04/06/2020   10:48 AM 07/09/2018   12:49 PM  PFT Results  FVC-Pre L 2.39  2.47  2.40  2.44  2.17  2.38  2.33   FVC-Predicted Pre % 88  90  87  87  78  83  79   Pre FEV1/FVC % % 65  62  59  57  65  65  59   FEV1-Pre L  1.55  1.53  1.41  1.40  1.41  1.55  1.38   FEV1-Predicted Pre % 77  75  69  67  67  72  62   DLCO uncorrected ml/min/mmHg 8.35  11.27  10.61  10.86  10.58  13.11  12.18   DLCO UNC% % 43  57  54  55  54  66  47   DLCO corrected ml/min/mmHg  11.27  10.61  10.86  10.58  13.11    DLCO COR %Predicted %  57  54  55  54  66    DLVA Predicted % 54  65  66  64  63  73  57     No results found for: NITRICOXIDE      Assessment & Plan:   COPD with acute exacerbation (HCC) Slow to resolve AECOPD with Pseudomonas infection; pansensitive on prior culture.  Treated with ciprofloxacin  for 14 days with some improvement in infectious symptoms.  Still with paroxysmal cough and wheezing.  Will treat her with steroid taper and Depo 80 mg injection in office today.  Target mucociliary clearance therapies with guaifenesin , flutter valve and nebulized treatments.  If she fails to improve over the next few days, would recommend CT imaging for further evaluation.  ED precautions reviewed.  Close follow-up.  Action plan in place.  Continue triple therapy regimen.  Patient Instructions  -Continue Breo 1 puff daily. Brush tongue and rinse mouth afterwards -Continue Spiriva  2 puffs daily -Continue Albuterol  inhaler 2 puffs or 3 mL neb every 6 hours as needed for shortness of breath or wheezing. Notify if symptoms persist despite rescue inhaler/neb use.  -Use the nebs Twice daily followed by flutter valve 10 times  -Complete antibiotic   -Increase guaifenesin  to 1200 mg Twice daily until symptoms improve then 600 mg Twice daily for cough/congestion -Prednisone  taper. 4 tabs for 3 days, then 3 tabs for 3 days, 2 tabs for 3 days, then 1 tab for 3 days, then stop. Take in AM with food. Start tomorrow -Promethazine  DM cough syrup 5 mL every 6 hours as needed for cough. May cause drowsiness. Do not drive after taking   If you aren't better by Monday, please call us  and I will order a CT scan of the chest given length of  your symptoms   Follow up in 2-3 weeks with Dr. Geronimo or Izetta Malachy PIETY. If symptoms do not improve or worsen, please contact office for sooner follow up or seek emergency care.      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  Intolerant of Esbriet .  Continue to monitor.  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  She is status post cardioversion.  Has not had repeat echo.  Euvolemic on exam today.  She is in normal sinus rhythm.  May need to consider BNP and repeat echo if symptoms fail to improve.  Follow-up with cardiology   Advised if symptoms do not improve or worsen, to please contact office for sooner follow up or seek emergency care.   I spent 45 minutes of dedicated to the care of this patient on the date of this encounter to include pre-visit review of records, face-to-face time with the patient discussing conditions above, post visit ordering of testing, clinical documentation with the electronic health record, making appropriate referrals as documented, and communicating necessary findings to members of the patients care team.  Comer LULLA Malachy, NP 04/17/2024  Pt aware and understands NP's role.

## 2024-04-17 ENCOUNTER — Encounter (HOSPITAL_BASED_OUTPATIENT_CLINIC_OR_DEPARTMENT_OTHER): Payer: Self-pay | Admitting: Nurse Practitioner

## 2024-04-21 ENCOUNTER — Encounter: Payer: Self-pay | Admitting: Internal Medicine

## 2024-04-21 DIAGNOSIS — A498 Other bacterial infections of unspecified site: Secondary | ICD-10-CM

## 2024-04-21 DIAGNOSIS — J849 Interstitial pulmonary disease, unspecified: Secondary | ICD-10-CM

## 2024-04-21 DIAGNOSIS — J441 Chronic obstructive pulmonary disease with (acute) exacerbation: Secondary | ICD-10-CM

## 2024-04-23 NOTE — Telephone Encounter (Signed)
 Erin Li, The patient is no better, still getting up thick yellow mucous and has completed the Cipro .  You mentioned in your note ordering a CT scan if she was not better.  Please advise.  Thank you.

## 2024-04-24 ENCOUNTER — Other Ambulatory Visit: Payer: Self-pay | Admitting: Pharmacy Technician

## 2024-04-24 ENCOUNTER — Other Ambulatory Visit: Payer: Self-pay

## 2024-04-24 NOTE — Progress Notes (Signed)
 Disenrolled; Spoke with patient and MD toke her off due to having a allergic reaction to the medication.

## 2024-04-24 NOTE — Telephone Encounter (Signed)
 Ordered urgent CT chest to be completed within next 3 days. Use nebs 2-3 times a day. Continue other recommendations for mucociliary clearance at last OV. Hospital/ED if symptoms fail to improve/worsen. Thanks

## 2024-04-25 ENCOUNTER — Ambulatory Visit (HOSPITAL_COMMUNITY)
Admission: RE | Admit: 2024-04-25 | Discharge: 2024-04-25 | Disposition: A | Discharge status: Home / Self Care | Source: Ambulatory Visit | Attending: Nurse Practitioner | Admitting: Nurse Practitioner

## 2024-04-25 DIAGNOSIS — A498 Other bacterial infections of unspecified site: Secondary | ICD-10-CM | POA: Diagnosis not present

## 2024-04-25 DIAGNOSIS — J849 Interstitial pulmonary disease, unspecified: Secondary | ICD-10-CM | POA: Diagnosis not present

## 2024-04-25 DIAGNOSIS — J432 Centrilobular emphysema: Secondary | ICD-10-CM | POA: Diagnosis not present

## 2024-04-25 DIAGNOSIS — J441 Chronic obstructive pulmonary disease with (acute) exacerbation: Secondary | ICD-10-CM | POA: Diagnosis not present

## 2024-04-25 DIAGNOSIS — R918 Other nonspecific abnormal finding of lung field: Secondary | ICD-10-CM | POA: Diagnosis not present

## 2024-04-25 DIAGNOSIS — R053 Chronic cough: Secondary | ICD-10-CM | POA: Diagnosis not present

## 2024-04-28 ENCOUNTER — Ambulatory Visit: Payer: Self-pay | Admitting: Nurse Practitioner

## 2024-04-28 NOTE — Progress Notes (Signed)
 Pt's CT shows some increased bronchitis and infectious changes in right lung. She's had adequate abx coverage for the infection so I suspect this is residual. Wouldn't recommend further abx at this point. I discussed with Dr. Geronimo. 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 Recommend AFB culture - she can either come prior to her visit 8/28 to pickup sputum cup or collect at OV. If she is still having coughing/congestion, continue mucociliary clearance and we can extend her prednisone .

## 2024-04-29 ENCOUNTER — Other Ambulatory Visit: Payer: Self-pay | Admitting: Nurse Practitioner

## 2024-04-29 ENCOUNTER — Encounter: Payer: Self-pay | Admitting: Internal Medicine

## 2024-04-29 DIAGNOSIS — J441 Chronic obstructive pulmonary disease with (acute) exacerbation: Secondary | ICD-10-CM

## 2024-04-29 DIAGNOSIS — J471 Bronchiectasis with (acute) exacerbation: Secondary | ICD-10-CM

## 2024-04-29 DIAGNOSIS — A498 Other bacterial infections of unspecified site: Secondary | ICD-10-CM | POA: Diagnosis not present

## 2024-04-29 NOTE — Addendum Note (Signed)
 Addended by: TRUDY WARREN CROME on: 04/29/2024 11:28 AM   Modules accepted: Orders

## 2024-05-01 ENCOUNTER — Encounter: Payer: Self-pay | Admitting: Internal Medicine

## 2024-05-01 ENCOUNTER — Other Ambulatory Visit: Payer: Self-pay

## 2024-05-01 ENCOUNTER — Ambulatory Visit (INDEPENDENT_AMBULATORY_CARE_PROVIDER_SITE_OTHER): Admitting: Internal Medicine

## 2024-05-01 ENCOUNTER — Telehealth (HOSPITAL_BASED_OUTPATIENT_CLINIC_OR_DEPARTMENT_OTHER): Payer: Self-pay

## 2024-05-01 VITALS — BP 102/60 | HR 77 | Temp 97.9°F | Ht 65.0 in | Wt 135.0 lb

## 2024-05-01 DIAGNOSIS — D721 Eosinophilia, unspecified: Secondary | ICD-10-CM

## 2024-05-01 DIAGNOSIS — J449 Chronic obstructive pulmonary disease, unspecified: Secondary | ICD-10-CM

## 2024-05-01 DIAGNOSIS — J8410 Pulmonary fibrosis, unspecified: Secondary | ICD-10-CM | POA: Diagnosis not present

## 2024-05-01 DIAGNOSIS — J441 Chronic obstructive pulmonary disease with (acute) exacerbation: Secondary | ICD-10-CM | POA: Diagnosis not present

## 2024-05-01 DIAGNOSIS — J439 Emphysema, unspecified: Secondary | ICD-10-CM

## 2024-05-01 DIAGNOSIS — J84112 Idiopathic pulmonary fibrosis: Secondary | ICD-10-CM | POA: Diagnosis not present

## 2024-05-01 DIAGNOSIS — A498 Other bacterial infections of unspecified site: Secondary | ICD-10-CM

## 2024-05-01 DIAGNOSIS — J4489 Other specified chronic obstructive pulmonary disease: Secondary | ICD-10-CM | POA: Diagnosis not present

## 2024-05-01 DIAGNOSIS — Z2239 Carrier of other specified bacterial diseases: Secondary | ICD-10-CM | POA: Diagnosis not present

## 2024-05-01 DIAGNOSIS — J849 Interstitial pulmonary disease, unspecified: Secondary | ICD-10-CM

## 2024-05-01 MED ORDER — SODIUM CHLORIDE 3 % IN NEBU: INHALATION_SOLUTION | RESPIRATORY_TRACT | 11 refills | Status: DC

## 2024-05-01 NOTE — Telephone Encounter (Signed)
 Copied from CRM #[id number]. Topic: Clinical - Medication Question >> May 01, 2024 12:17 PM Lavanda D wrote: Reason for CRM: Desiree with [facility] is calling in regards to the order for the  sodium chloride  HYPERTONIC 3 % nebulizer solution [[id number]]. Order says in Nebulizer 2x daily, however, the pharmacy says that what they have is and she would like to confirm if that is okay. Please call back to confirm.

## 2024-05-01 NOTE — Telephone Encounter (Signed)
 4mL BID of 3% hypertonic saline is fine

## 2024-05-01 NOTE — Patient Instructions (Addendum)
 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  But ever so slightly since 2015 it is slowly gotten worse.  Continuied worsening JAn 2025 CT chest At conference the concern is that this Is IPF .   -Did not qualify for clinical trial on account of combined emphysema  -  DID NOT TOLERATE Esbriet  low dose protocol due to side effects of fatigue and altered tase   Plan -- supportive care for now   RLL lung ndoule - dec 2023 =- sstable x 10 days; none reported summer 2025  - stable x 10 years  Plan  - no  further followup  Plan 8 weeks with APP or Dr Geronimo

## 2024-05-01 NOTE — Progress Notes (Signed)
 OV 04/10/2017   Chief Complaint  Patient presents with   Follow-up    Pt states her breathing is unchanged since last OV. Pt states she has mild non prod cough. Pt denies CP/tightness, f/c/s, and chest congestion.    Follow-up COPD but now has concerned that she has developed interstitial lung disease  She is here with her husband. Back in 2015 a high-resolution CT chest because I thought I heard crackles and thoracic radiology ruled out interstitial lung disease. In the interim she's had lung cancer screening CT chest at overall stability in her pulmonary function test. But there was concern because of crackles that she might have interstitial lung disease so we did a high-resolution CT chest and July 2018 and thoracic radiologist reported as definite precedence of ILD although it is not a UIP pattern. [name] She does say that her son and over the lung transplant in [place] 8 years ago at age of 82 because of IPF. She did have autoimmune panel that is only trace positive for ENP . 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 She denies any humidifier or mildew or mold in the house. The CT chest also shows coronary artery calcification. Of note I personally visualized the CT chest.  ACCP ILD questiin  - symptioms: occ cough x 2 years with some night cough and mild phleogn ona nd off. Very mild non troubling dyspnea x 3 years - pasth medical : GERD +, mild dysphagia + - personal exposure x -: +ver for remote use of rec / street drugs. Cig smoked age 22 though 109 at 36 cigs/dat - fam hx: COPD +, son with transplant for ?  IPF at age 51 - home exposurE: does have hot tube - uses it once a month. 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 Range 04/03/2017 10:18  CK Total Latest Ref Range: 7 - 177 U/L 63  CK-MB Latest Ref Range: 0.3 - 4.0 ng/mL 2.4  Aldolase Latest Ref Range: <=8.1 U/L 3.5  Sed Rate Latest Ref Range: 0 - 30 mm/hr 20  Anit Nuclear Antibody(ANA) Latest Ref Range: NEGATIVE  NEG  Angiotensin-Converting Enzyme Latest Ref Range: 9 - 67 U/L 67  Cyclic Citrullin Peptide Ab Latest Units: Units <16  ds DNA Ab Latest Units: IU/mL 1  ENA RNP Ab Latest Ref Range: 0.0 - 0.9 AI 1.4 (H)  RA Latex Turbid. Latest Ref Range: <14 IU/mL <14  SSA (Ro) (ENA) Antibody, IgG Latest Ref Range: <1.0 NEG AI  <1.0 NEG  SSB (La) (ENA) Antibody, IgG Latest Ref Range: <1.0 NEG AI  <1.0 NEG  Scleroderma (Scl-70) (ENA) Antibody, IgG Latest Ref Range: <1.0 NEG AI  <1.0 NEG    OV 09/28/2017  Chief Complaint  Patient presents with   Follow-up    PFT done today. States she has been doing good and has no complaints of cough, SOB, or CP.    Fu mixed empyhsema and pulmonary fibrosis  For  years of only known who is a COPD patient with a family history of pulmonary fibrosis.  But it appears that in 2018 she started developing signs of pulmonary fibrosis with crackles  and findings of indeterminate UIP on CT chest July 2018.  She has family history of pulmonary fibrosis and that her son had lung transplant for the same.  She now presents for follow-up.  In the interim she started feeling better.  She is less dyspneic.  She continues with the Brio.  There are no new issues.  Pulmonary function test done as documented below shows an improvement.  Therefore instead of biopsy she feels keeping an eye on this is a better approach.    Walking desaturation test on 09/28/2017 185 feet x 3 laps on ROOM AIR:  did NOT desaturate. Rest pulse ox was 99%, final pulse ox was96 %. HR response 78 /min at rest to 105/min at peak exertion. Patient Erin Li  Did nto Desaturate < 88% . Erin Li yes  Desaturated </= 3% points.  Erin Li yes did get tachyardic   OV 01/01/2018  Chief Complaint  Patient presents with   Follow-up    PFT done today.  Pt states she has been doing good since last visit. Had a cold mid January 2019 but has been better since. Denies any complaints of cough, SOB, or CP.   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 She continues on Brio. Since last visit January 2019 she continues to feel better. COPD cat score is very minimal as documented below. She had pulmonary function test today and this shows stability in her FVC after initial improvement. Her DLCO is even better. She is on observation course regarding her ILD given the improvement in her pulmonary function test. Her last CT scan of the chest was July 2018        OV 07/09/2018  Subjective:  Patient ID: Erin Li, female , DOB: 05/18/1941 , age 83 y.o. , MRN: [id number] , ADDRESS: [address] [place] [address]   07/09/2018 -   Chief Complaint  Patient presents with   Consult    Alot of coughing producing mucus no color at this time.     HPI Erin Li 83 y.o. 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  She tells me that for the last 3 weeks has had increased cough with congestion and white sputum more than baseline but activities of daily living and shortness of breath or guarding is all unchanged and she has good effort tolerance.  There are no new other issues.  In terms of ILD: Last visit I thought I will be in improved based on improvement in pulmonary function test but actually that might of been a red herring because today's pulmonary function test shows a decline which is in line with previous pulmonary function test showing stability.  She certainly feels stable.  In  July 2019 she had a low-dose CT scan for lung cancer and this ruled out lung cancer on screening.  They were able to report some mildly changes at the base.  She is not interested in surgical lung biopsy because her son who had pulmonary fibrosis finally died from 8 years of transplant from pulmonary embolism, stroke and acute renal failure and chronic critical illness in the rehab facility.      OV 10/08/2019  Subjective:  Patient ID: Erin Li, female , DOB: 1941-06-01 , age 27 y.o. ,  MRN: [id number] , ADDRESS: [address] [place] [address]   10/08/2019 -   Chief Complaint  Patient presents with   Follow-up    Pt states she has been doing well since last visit and states her breathing has been doing well. Pt will have an occ cough.   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  Last seen in 2018-05-25.  She presents with her husband.  Since the onset of the COVID-19 pandemic there have been in the house and socially isolated.  She has been doing well without any problems overall stable.  She not exercising as much as she used to.  She not taking any inhalers for her COPD.  She only takes Mucinex .  Therefore her cough symptom with mucus is slightly worse than before.  She had high-resolution CT scan of the chest which I reviewed the report and agree with the findings and I interpreted as well with visualization: She has emphysema associated with alternate diagnosis pattern.  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  She still grieving from her son who died in Sep 21, 2019from pulmonary fibrosis.    OV 06/01/2020   Subjective:  Patient ID: Erin Li, female , DOB: 1941-07-15, age 27 y.o. years. , MRN: [id number],  ADDRESS: [address] [place]  [address] PCP  Luke Agent, MD Providers : Treatment Team:  Attending Provider: Geronimo Amel, MD   Chief Complaint  Patient presents with   Follow-up    ILD, no concerns    predominantly COPD with some amount of basal pulmonary fibrosis  N(atlernate to UIP)  in the setting of her son having pulmonary fibrosis alt- decieasedd May 25, 2018   HPI Deidre Manka 83 y.o. -presents for follow-up.  Last seen February 2021.  Since then she is doing stable.  She continues to be stable.  Her symptom scores are very minimal and shows stability.  She had pulmonary function test August 2021 and compared to November 2019 she is stable.  Her walking desaturation test is also stable.  Her last CT scan was January 2021.  She prefers to have monitoring for pulmonary fibrosis because of her son's issue.  She likes the strategy of alternating pulmonary function test and also CT scan of the chest.      OV 12/06/2021  Subjective:  Patient ID: Erin Li, female , DOB: 09-02-1941 , age 39 y.o. , MRN: [id number] , ADDRESS: [address] [place] [address] PCP Luke Agent, MD Patient Care Team: Luke Agent, MD as PCP - General (Internal Medicine)  This Provider for this visit: Treatment Team:  Attending Provider: Geronimo Amel, MD   Follow-up emphysema with some amount of ILD.  Last seen in 05/25/20.  It is now 18 months since I last saw her.  Doing well overall.  Last month she did have a flareup requiring antibiotics and prednisone  for COPD exacerbation.  She continues on Breo.  No new health issues.  April 23, 2022 she plans to go to [place].  She is wanting antibiotic and prednisone .  Her last pulmonary function test was a year and a half ago.  Her last CT scan of the chest was a year ago or more.  She is agreeable to have follow-up reassessment done.  Symptom score  is as below and stable.  Walking desaturation test shows a tendency to desaturate but still acceptable.    OV  05/18/2022   Subjective:  Patient ID: Erin GORMAN Silvan, female , DOB: 12-31-1940, age 21 y.o. years. , MRN: [id number],  ADDRESS: [address] [place] [address] PCP  Gerome Brunet, DO Providers : Treatment Team:  Attending Provider: Geronimo Amel, MD Patient Care Team: Gerome Brunet, DO as PCP - General (Family Medicine)    Chief Complaint  Patient presents with   Follow-up    PFT performed today.  Pt states she has been doing okay since last visit. States for the past 2 weeks she has been coughing a lot getting up white phlegm and states cough is worse at night.    Follow-up emphysema with some amount of ILD   HPI Alexxia S Rostron 64 y.o. -[son also has ILD].  Returns for follow-up.  In August 2023 she went to [place] with her female side of the family.  She return May 02, 2022.  She had a great time.  No issues walking around the.  Then after coming back for the last 2 weeks she has respiratory symptoms particularly increased cough for the last 2 to 3 weeks associated with white sputum occasionally yellow.  Definitely worse than baseline.  Moderate in severity and but no fever.  No chills no worsening shortness of breath.  No wheezing.  No edema no orthopnea no chills.  However pulmonary function test today shows a decline [see below].  Despite this increase in symptoms her subjective symptom score below is stable.  Of note her husband is here with her today.    PFT   Narrative & Impression  CLINICAL DATA:  Interstitial lung disease   EXAM: CT CHEST WITHOUT CONTRAST   TECHNIQUE: Multidetector CT imaging of the chest was performed following the standard protocol without intravenous contrast. High resolution imaging of the lungs, as well as inspiratory and expiratory imaging, was performed.   RADIATION DOSE REDUCTION: This exam was performed according to the departmental dose-optimization program which includes automated exposure control, adjustment of the  mA and/or kV according to patient size and/or use of iterative reconstruction technique.   COMPARISON:  10/01/2020, 09/23/2019, 09/27/2018   FINDINGS: Cardiovascular: Aortic atherosclerosis. Normal heart size. Left and right coronary artery calcifications. No pericardial effusion.   Mediastinum/Nodes: No enlarged mediastinal, hilar, or axillary lymph nodes. Thyroid  gland, trachea, and esophagus demonstrate no significant findings.   Lungs/Pleura: Moderate centrilobular and paraseptal emphysema. Unchanged mild pulmonary fibrosis in a pattern with apical to basal gradient, featuring irregular peripheral interstitial opacity, septal thickening, and ground-glass, mild, tubular bronchiectasis, with small areas of subpleural bronchiolectasis at the lung bases. No significant air trapping on expiratory phase imaging. Multiple small bilateral pulmonary nodules are stable and benign, largest a 0.9 cm nodule of the superior segment right lower lobe (series 4, image 57). No pleural effusion or pneumothorax.   Upper Abdomen: No acute abnormality.   Musculoskeletal: No chest wall abnormality. No suspicious osseous lesions identified.   IMPRESSION: 1. Unchanged mild pulmonary fibrosis in a pattern with apical to basal gradient, featuring irregular peripheral interstitial opacity, septal thickening, and ground-glass, with small areas of subpleural bronchiolectasis at the lung bases. Fibrotic findings are not significantly changed compared to immediate prior examination, although as previously reported are worsened over a longer period of time dating back to at least 2015. Findings are categorized as probable UIP per consensus guidelines: Diagnosis of Idiopathic Pulmonary Fibrosis:  An Official ATS/ERS/JRS/ALAT Clinical Practice Guideline. Am JINNY Honey Crit Care Med Vol 198, Iss 5, [phone], May 05 2017. 2. Multiple small bilateral pulmonary nodules are stable and definitively benign. 3.  Emphysema. 4. Coronary artery disease.   Aortic Atherosclerosis (ICD10-I70.0) and Emphysema (ICD10-J43.9).     Electronically Signed   By: Marolyn JONETTA Jaksch M.D.   On: 02/25/2022 17:51    No results found.     OV 08/01/2022  Subjective:  Patient ID: Erin GORMAN Silvan, female , DOB: 11-Jan-1941 , age 26 y.o. , MRN: [id number] , ADDRESS: [address] [place] [address] PCP Gerome Brunet, DO Patient Care Team: Gerome Brunet, DO as PCP - General (Family Medicine)  This Provider for this visit: Treatment Team:  Attending Provider: Geronimo Amel, MD  Follow-up emphysema with some amount of ILD  08/01/2022 -   Chief Complaint  Patient presents with   Follow-up     HPI Melvenia AARYA ROBINSON 84 y.o. -presents with her husband.  After the recent flareup she is better.  However she ended up with back pain and sciatica 6 weeks ago.  She got prednisone  course but it did not help.  Since then she has not acupuncture with massage therapy and she is significantly better although she still requiring a cane.  Therefore we did not walk her today.  She had a repeat pulmonary function test that shows slight improvement in FVC but the DLCO is still the same.  Her FVC over the last 5 to 8 years is range bound but the DLCO seems to taken a turn for the worse compared to several years ago.  Still the decline is not greater than 5 to 10% in the last 1 or 2 years to meet progressive phenotype definition.  I did visualize the CT scan currently with and also compared it to the old one.  There is definitely more ILD findings now compared to 2015 but is reported she does not meet the classic definition of progressive phenotype.  The pet bird she had is now deceased for the last 5 years.  Her son himself had IPF/ILDand had lung transplant several years ago.  She is willing to see genetics counselor.  Her husband prefers an aggressive approach with potential antifibrotic's because of the slow progression in  the future course being uncertain.  She prefers a more wait and watch approach.  We took a shared decision making to discuss in case conference repeat pulmonary function test and then decide    Husband is particularly worried about chronic cough.  Is particular worse in the night.  Is been going on for a long time but worse in the last 6 months definitely much worse at night.  She taking fish oil for many years but recently she has also been having heartburn.  There is no wheezing.  He is worried if it could be related to ILD.  Xxxxx[id number]  DEC 2023 Brief History: MR patient for > 18 years. Followed as gold stage 2 copd. but had bird that died 5 years ago. Son had transplant for IPF in [place] 10-15 years ago. She even was in copd trial.  Ratios always obstrucive with fev1 1.4L/67% and current DLCO 10.  Several years ago heard crackles in lung base but CT always read as no ILD and just atelectasis. Now CT says ILD and worse. FVC in 2018 - 2.2-2.38L -> now 2.44 DLC in 2018- 11.5-12.5 -> now 10.8 Does not meet Progr Phenotype definition for ILD (>  5-10% decline in 1-2 years) Questions:  Does she have ILD  Is this UIP  Would you biopsy OR Rx OR watch?  iscussion synopsis:  2018 -> june 2023: Ches CT bibasalar ILD (TB, GGO, subplerua reticualtion, No HC. No air trapping). Per Poff - not progressing.  in retrospect even in 2018 ILD +.. In 2015: ILA. Definite progression since 2015.  NEW RLL NODULAR OPACITY - June 2023 reported in conference but not in officia report - What is the final conclusion per 2018 ATS/Fleischner Criteria - PROBABLE UIP  - Concordance with official report: DISCORDANT - Conference has picked up a new RLL NODULE  Pathology discussion of biopsy NO:    MDD Impression/Recs: 1) nodule - consier PET/ repeat CT 2) Prob UIP (fam hx,  emphysema, age > 65,s eroplogy) = IPF -> Rx with antifibotic. 3) emphysema + -> currently ILD > emphysema but barely      IMPRESSION: Dominant nodule in the superior segment of the right lower lobe has been present since 2014 consistent with a benign process. Other nodules also demonstrated long-term presence consistent with benign process.   Underlying emphysematous lung changes. Interstitial process also seen as on the prior high-resolution chest CT scan.     Electronically Signed   By: Ranell Bring M.D.   On: 09/05/2022 18:24 OV 09/22/2022  Subjective:  Patient ID: Erin GORMAN Silvan, female , DOB: Jul 22, 1941 , age 32 y.o. , MRN: [id number] , ADDRESS: [address] [place] [address] PCP Gerome Brunet, DO Patient Care Team: Gerome Brunet, DO as PCP - General (Family Medicine)  This Provider for this visit: Treatment Team:  Attending Provider: Geronimo Amel, MD    09/22/2022 -   Chief Complaint  Patient presents with   Follow-up    Pft review,      HPI Gianna ADALYN PENNOCK 83 y.o. -returns for follow-up.  At this visit she is here with her husband.  He is acting as an independent historian.  She tells me that overall she is stable but she is having chronic cough.  She is quite bothered by the chronic cough.  She tells me that over the last year or 2 it slowly gotten worse but since last visit it is stable.  Nighttime cough is worse than daytime.  It does wake her husband up.  She does bring junk.  It is clear but sometimes slightly yellow-tinged.  There is no wheezing.  Happening despite Breo.  Happening despite stopping fish oil.  Her shortness of breath is stable.  We discussed several things today  -Chronic cough: Okay to restart fish oil but will also check blood allergy panel and CBC with differential -COPD: Is stable.  She will continue inhaler therapy  -Right lower lobe lung nodule: This was parted during the case conference.  We did a super D CT chest and the current radiologist has said it has been stable for 10 years.  No further follow-up  -Pulmonary fibrosis: The  concern at the conference is that she has IPF based on probable UIP on the CT chest, slow emergence and progression and also son having pulmonary fibrosis IPF.  Last serologies showed trace ANA positive but that was 10 years ago.  She is agreeable for repeat.  We discussed therapeutic options including standard of care nintedanib.  She said that many years ago she did have a cardiac cath and she did have a blockage but no stent was placed or angioplasty was done.  We also  discussed standard of care pirfenidone  and the side effects.  We then discussed a modified pirfenidone  through a clinical trial [facility].  The side effect profile here is less but for the first 6 months she could get placebo standard of care pirfenidone  or the study drug.  Then after 6 months should be randomized to different doses of the study drug which research is on show for his one third less side effect profile than standard of care pirfenidone .  She is more interested in this option.SABRA  However we do need to get serologies.   FEB 2025   10/31/2023 -   Chief Complaint  Patient presents with   Hospitalization Follow-up    Hospitalized from 2/12- 10/23/23 new onset AFib. Breathing has improved back to her baseline. She has occ cough with yellow to white sputum.      HPI NIA NATHANIEL 82 y.o. -returns for follow-up for combined pulmonary fibrosis emphysema.  I last saw her in November 2024.  Then based on husband's story he is an independent historian and also her story and also review of the external medical record she was discharged 10/23/2023 after admission for atrial fibrillation.  She is now on Eliquis  and bisoprolol .  Shortness of breath was worsening before the A-fib admission but is now back to baseline.  Her symptoms are minimal.  Her exercise hypoxemia test is stable.  However she did tell me that she had a COPD flareup while in the hospital.  She now wants antibiotic and prednisone  handy.  I did give her a  prescription for this doxycycline  and short course prednisone  with a refill.  Because of this admission she did not have pulmonary function test but she did have a high-resolution CT chest that I personally visualized and showed it to her and her husband compared to the high-resolution 10 years ago she definitely has frank ILD right now it is probable UIP I agree with the radiologist.  There is progression.  It is still mild in severity and she is not feeling it.  However it is there and it has progressed.  Her son had transplant from fibrosis.  In the past she had declined antifibrotic's but we went over this again.  Went over the inflation reduction act and the total co-pay burden.  Went over pirfenidone  and it side effect profile.  Based on all this she is willing to try the low-dose protocol.  We did discuss the risk benefits and limitations.   Esbriet /Pirfenidone  requires intensive drug monitoring due to high concerns for Adverse effects of , including  Drug Induced Liver Injury, significant GI side effects that include but not limited to Diarrhea, Nausea, Vomiting,  and other system side effects that include Fatigue, headaches, weight loss and other side effects such as skin rash. These will be monitored with  blood work such as LFT initially once a month for 6 months and then quarterly        CT Chest data from date: Jan 2024  - personally visualized and independently interpreted : yes - my findings are: as below Narrative & Impression  CLINICAL DATA:  83 year old female with history of chronic interstitial lung disease.   EXAM: CT CHEST WITHOUT CONTRAST   TECHNIQUE: Multidetector CT imaging of the chest was performed following the standard protocol without intravenous contrast. High resolution imaging of the lungs, as well as inspiratory and expiratory imaging, was performed.   RADIATION DOSE REDUCTION: This exam was performed according to the departmental dose-optimization  program which includes automated exposure control, adjustment of the mA and/or kV according to patient size and/or use of iterative reconstruction technique.   COMPARISON:  High-resolution chest CT 02/25/2022.   FINDINGS: Cardiovascular: Heart size is normal. There is no significant pericardial fluid, thickening or pericardial calcification. There is aortic atherosclerosis, as well as atherosclerosis of the great vessels of the mediastinum and the coronary arteries, including calcified atherosclerotic plaque in the left main, left anterior descending, left circumflex and right coronary arteries. Calcifications of the aortic valve and mitral annulus.   Mediastinum/Nodes: No pathologically enlarged mediastinal or hilar lymph nodes. Please note that accurate exclusion of hilar adenopathy is limited on noncontrast CT scans. Esophagus is unremarkable in appearance. No axillary lymphadenopathy.   Lungs/Pleura: High-resolution images again demonstrate widespread but patchy areas of ground-glass attenuation, septal thickening, thickening of the peribronchovascular interstitium, cylindrical bronchiectasis and peripheral bronchiolectasis most evident throughout the mid to lower lungs bilaterally. These findings appear mildly progressive compared to the prior examination. No frank honeycombing confidently identified at this time. Inspiratory and expiratory imaging demonstrates some mild air trapping indicative of mild small airways disease. No acute consolidative airspace disease. No pleural effusions. In the superior segment of the right lower lobe (axial image 68 of series 4) there is a 10 x 8 mm pulmonary nodule which is stable compared to prior examinations. No other larger more suspicious appearing pulmonary nodules or masses are noted. Moderate centrilobular and paraseptal emphysema.   Upper Abdomen: Aortic atherosclerosis.   Musculoskeletal: Multiple old healed bilateral rib  fractures. There are no aggressive appearing lytic or blastic lesions noted in the visualized portions of the skeleton.   IMPRESSION: 1. 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 Mild progression of disease compared to the prior study. 2. There is also moderate centrilobular and paraseptal emphysema. 3. Aortic atherosclerosis, in addition to left main and three-vessel coronary artery disease. 4. There are calcifications of the aortic valve and mitral annulus. Echocardiographic correlation for evaluation of potential valvular dysfunction may be warranted if clinically indicated.   Aortic Atherosclerosis (ICD10-I70.0) and Emphysema (ICD10-J43.9).     Electronically Signed   By: Toribio Aye M.D.   On: 10/06/2023 09:42       OV 04/03/2024  Subjective:  Patient ID: Erin GORMAN Silvan, female , DOB: 02/04/1941 , age 75 y.o. , MRN: [id number] , ADDRESS: [address] [place] [address] PCP Gerome Brunet, DO Patient Care Team: Gerome Brunet, DO as PCP - General (Family Medicine) Ladona Heinz, MD as PCP - Cardiology (Cardiology)  This Provider for this visit: Treatment Team:  Attending Provider: Geronimo Amel, MD    04/03/2024 -   Chief Complaint  Patient presents with   Acute Visit    Pt states this is a regular checkup Productive cough for a couple months with green/yellow phlegm. Worst at night.     HPI Ellianne ARIYONNA TWICHELL 83 y.o.  acue visit. She had called in for aecopd. Rx with doxuy and pred but not improved. Got CXR - non revelaing. Got sputum culture - growing spedumonas (new first time). Still having lot of cough with yellow bown sputum. Also esbreit low dose protocl and comlaints of tiredness, low motivation , ftiague, dysguesia. Husband repors she coughs till 3am. And is very irritable  PFT stable thugh dlco down Sit  stand test stable Dyspnea stable Cough worse Fatigue worse  OV 05/01/2024  Subjective:  Patient ID: Erin GORMAN Silvan, female , DOB: July 13, 1941 , age 64 y.o. , MRN: [id number] , ADDRESS: [address] [place] [address] PCP Gerome Brunet, DO Patient Care Team: Gerome Brunet, DO as PCP - General (Family Medicine) Ladona Heinz, MD as PCP - Cardiology (Cardiology)  This Provider for this visit: Treatment Team:  Attending Provider: Geronimo Amel, MD    05/01/2024 -   Chief Complaint  Patient presents with   Medical Management of Chronic Issues   Interstitial Lung Disease    Cough is some better, still has some cough with grey to yellow sputum.       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  =# HPI STEPHANIEANN POPESCU 56 y.o. -returns for follow-up.  She finished Pseudomonas treatment with Cipro  after having failed doxycycline  before that.  She seemed to get better but then she says she declined.  She is seen nurse practitioner and she had to do another prednisone  course which she just finished a few days ago.  She says that overall she is back to baseline in terms of her shortness of breath her energy levels but the cough is just worse than baseline.  It seems like ever since she had a flareup requiring Doxy and subsequently Cipro  she is never returned to baseline.  The Cipro  only helped transiently.  Nurse practitioner ordered another sputum culture which she is given today for AFB but we will also add a Gram stain and culture for that.  She had another CT scan of the chest that I personally visualized.  Slight increase in tree-in-bud infiltrates for that.  Review of the labs indicate that she has some  eosinophilia to 100 cells per cubic millimeter.     SYMPTOM SCALE -  06/01/2020  12/06/2021  05/18/2022 Copd exac  08/01/2022  07/12/2023  10/31/2023  04/03/2024 Esbietr low dose  New pse pseudmonasas  Stopped esbiret after this  Started spirivaa 05/01/2024 Spiriva  breo  O2 use ra ra0 ra ra ra ra ra ra  Shortness of Breath 0 -> 5 scale with 5 being worst (score 6 If unable to do) 0     0 0  At rest 0 0 0 0 0 0 0 0  Simple tasks - showers, clothes change, eating, shaving 0 00 0 0 0 0 0 0  Household (dishes, doing bed, laundry) 0  0 0 0 0 0   Shopping 0 0 0 0 0 0 0 0  Walking level at own pace 0 0 0 0 0 0 0 0  Walking up Stairs 2 2 2 3 3 2 2 2   Total (30-36) Dyspnea Score 2 2 2 3 3 2 2 2   How bad is your cough? 2 3 3  0 3 4 5 3   How bad is your fatigue 0 0 0 00 00 2 1 0  How bad is nausea 00 0 0 0 0 0 0 00  How bad is vomiting?  0 0 0 0 0 0 0 0  How bad is diarrhea? 0 0 0 0 0 0 0 0  How bad is anxiety? 0 0 0 0 0 0 0 0  How bad is depression 0  0 0 0 0 0 0 0  ra ra  Simple office walk 185 feet x  3 laps goal with forehead probe 12/06/2021  12/07/2022  07/12/2023  10/31/2023  04/03/2024   O2 used ra ra ra ra   Number laps completed 3 3 Sit stand x 10-15 Sitt and stand x 15   Comments about pace avg      Resting Pulse Ox/HR 97% and 91/min 100% and HR 72 93% and HR 79 97% and HR 70   Final Pulse Ox/HR 93% and 112/min 94% and HR 101 90% and HR 101 93% and HR 125   Desaturated </= 88% no no  no   Desaturated <= 3% points yes Yes, 6  yes   Got Tachycardic >/= 90/min yes yes  yes   Symptoms at end of test No complaints N compaints  No complaint   Miscellaneous comments x   x    CT Chest data from date: *below  - personally visualized and independently interpreted : yes - my findings are: as below  IMPRESSION: 1. Moderate centrilobular emphysema. Worsening diffuse bronchial wall thickening throughout both lower lobes, worse on the right than the left, which may reflect changes of  acute or chronic bronchitis. Superimposed tree-in-bud nodularity dependently within the right lower lobe, as can be seen in small airways infection, inflammation, or aspiration. No lobar pneumonia or pulmonary edema. 2. Underlying changes consistent with a superimposed interstitial lung disease also again noted, unchanged.   Aortic Atherosclerosis (ICD10-I70.0) and Emphysema (ICD10-J43.9).     Electronically Signed   By: Rogelia Myers M.D.   On: 04/25/2024 10:30  PFT     Latest Ref Rng & Units 03/06/2024    8:06 AM 12/04/2022    8:48 AM 09/22/2022   11:43 AM 08/01/2022    9:17 AM 05/18/2022    3:08 PM 04/06/2020   10:48 AM 07/09/2018   12:49 PM  PFT Results  FVC-Pre L 2.39  2.47  2.40  2.44  2.17  2.38  2.33   FVC-Predicted Pre % 88  90  87  87  78  83  79   Pre FEV1/FVC % % 65  62  59  57  65  65  59   FEV1-Pre L 1.55  1.53  1.41  1.40  1.41  1.55  1.38   FEV1-Predicted Pre % 77  75  69  67  67  72  62   DLCO uncorrected ml/min/mmHg 8.35  11.27  10.61  10.86  10.58  13.11  12.18   DLCO UNC% % 43  57  54  55  54  66  47   DLCO corrected ml/min/mmHg  11.27  10.61  10.86  10.58  13.11    DLCO COR %Predicted %  57  54  55  54  66    DLVA Predicted % 54  65  66  64  63  73  57        LAB RESULTS last 96 hours No results found.       has a past medical history of COPD (chronic obstructive pulmonary disease) (HCC), Coronary artery disease, Diverticulosis, HTN (hypertension), and Lung nodule.   reports that she quit smoking about 12 years ago. Her smoking use included cigarettes. She started smoking about 62 years ago. She has a 37.5 pack-year smoking history. She has never used smokeless tobacco.  Past Surgical History:  Procedure Laterality Date   CARDIAC CATHETERIZATION  CARDIOVERSION N/A 11/28/2023   Procedure: CARDIOVERSION;  Surgeon: Mona Vinie BROCKS, MD;  Location: [facility];  Service: Cardiovascular;  Laterality: N/A;   CORONARY ANGIOPLASTY     FRACTURE  SURGERY     INTRAMEDULLARY (IM) NAIL INTERTROCHANTERIC Right 02/26/2022   Procedure: INTRAMEDULLARY (IM) NAIL INTERTROCHANTRIC;  Surgeon: Cristy Bonner DASEN, MD;  Location: [facility] OR;  Service: Orthopedics;  Laterality: Right;   TONSILLECTOMY AND ADENOIDECTOMY      Allergies  Allergen Reactions   Simvastatin  Other (See Comments)    Severe myalgia eve3n with 10 mg daily   Digoxin  And Related Other (See Comments)    Red palms   Esbriet  [Pirfenidone ]     Fatigue, loss of taste   Metoprolol  Tartrate     Other Reaction(s): 2nd degree HB    Immunization History  Administered Date(s) Administered   Fluad Quad(high Dose 65+) 06/20/2022   INFLUENZA, HIGH DOSE SEASONAL PF 06/05/2016, 06/04/2017, 05/20/2018, 05/06/2019   Influenza Split 06/04/2013, 05/05/2014   Influenza Whole 05/28/2012   Influenza, Quadrivalent, Recombinant, Inj, Pf 05/25/2021   Influenza,inj,Quad PF,6+ Mos 06/05/2015   Influenza,trivalent, recombinat, inj, PF 06/19/2023   Moderna Sars-Covid-2 Vaccination 11/10/2019, 12/08/2019, 06/30/2020, 01/11/2021, 08/02/2021   PNEUMOCOCCAL CONJUGATE-20 11/17/2020   Pfizer(Comirnaty)Fall Seasonal Vaccine 12 years and older 06/19/2023   Pneumococcal Conjugate-13 01/26/2015   Pneumococcal Polysaccharide-23 09/04/2005, 09/24/2012   Tdap 03/20/2012    Family History  Problem Relation Age of Onset   COPD Mother    Heart attack Father    Kidney disease Father    Lung cancer Brother 65   Pulmonary fibrosis Son        had lung transplant     Current Outpatient Medications:    albuterol  (PROVENTIL ) (2.5 MG/3ML) 0.083% nebulizer solution, Take 3 mLs (2.5 mg total) by nebulization every 6 (six) hours as needed for wheezing or shortness of breath., Disp: 75 mL, Rfl: 5   apixaban  (ELIQUIS ) 5 MG TABS tablet, Take 1 tablet (5 mg total) by mouth 2 (two) times daily., Disp: 180 tablet, Rfl: 0   Ascorbic Acid (VITAMIN C) 1000 MG tablet, Take 1,000 mg by mouth daily., Disp: , Rfl:    b complex  vitamins tablet, Take 2 tablets by mouth daily., Disp: , Rfl:    BREO ELLIPTA  100-25 MCG/ACT AEPB, inhale ONE PUFF into THE lungs EVERY DAY, Disp: 60 each, Rfl: 11   calcium -vitamin D  250-100 MG-UNIT per tablet, Take 1 tablet by mouth daily. , Disp: , Rfl:    docusate sodium  (COLACE) 100 MG capsule, Take 1 capsule (100 mg total) by mouth 2 (two) times daily., Disp: 10 capsule, Rfl: 0   furosemide  (LASIX ) 40 MG tablet, TAKE 1 TABLET BY MOUTH DAILY AS NEEDED, Disp: 90 tablet, Rfl: 1   glucosamine-chondroitin 500-400 MG tablet, Take 1 tablet by mouth every morning., Disp: , Rfl:    guaiFENesin  (MUCINEX ) 600 MG 12 hr tablet, Take 600 mg by mouth 2 (two) times daily., Disp: , Rfl:    JARDIANCE  10 MG TABS tablet, Take 1 tablet (10 mg total) by mouth daily before breakfast., Disp: 30 tablet, Rfl: 11   LUTEIN PO, Take 1 tablet by mouth daily., Disp: , Rfl:    magnesium  30 MG tablet, Take 30 mg by mouth daily in the afternoon., Disp: , Rfl:    Melatonin 10 MG TABS, Take 10 mg by mouth at bedtime., Disp: , Rfl:    omeprazole (PRILOSEC) 20 MG capsule, Take 20 mg by mouth daily., Disp: , Rfl:  OVER THE COUNTER MEDICATION, Take 2 mg by mouth daily in the afternoon. Biotin 2 mg, Disp: , Rfl:    OVER THE COUNTER MEDICATION, Place 1 drop into both eyes daily as needed (dry eye). OTC eye drop, Disp: , Rfl:    PAPAYA PO, Take 3 tablets by mouth daily as needed (heartburn)., Disp: , Rfl:    potassium chloride  SA (KLOR-CON  M) 20 MEQ tablet, TAKE 2 TABLETS (40 MEQ TOTAL) BY MOUTH DAILY AS NEEDED (WHEN YOU TAKE FUROSEMIDE )., Disp: 180 tablet, Rfl: 1   promethazine -dextromethorphan (PROMETHAZINE -DM) 6.25-15 MG/5ML syrup, Take 5 mLs by mouth 4 (four) times daily as needed for cough., Disp: 180 mL, Rfl: 0   Red Yeast Rice 600 MG TABS, Take 2,200 mg by mouth every evening., Disp: , Rfl:    selenium 50 MCG TABS, Take 50 mcg by mouth daily., Disp: , Rfl:    sodium chloride  HYPERTONIC 3 % nebulizer solution, 3 ML IN NEB  TWICE DAILY DX J84.9, Disp: 180 mL, Rfl: 11   sotalol  (BETAPACE ) 80 MG tablet, Take 1 tablet (80 mg total) by mouth daily., Disp: 90 tablet, Rfl: 2   Tiotropium Bromide  Monohydrate (SPIRIVA  RESPIMAT) 2.5 MCG/ACT AERS, Inhale 2 puffs into the lungs daily., Disp: 12 g, Rfl: 1   valsartan  (DIOVAN ) 40 MG tablet, Take 1 tablet (40 mg total) by mouth every evening. Pt has an office visit in May. Please keep your scheduled appt for further refills, Disp: 90 tablet, Rfl: 2   vitamin B-12 (CYANOCOBALAMIN) 1000 MCG tablet, Take 1,000 mcg by mouth daily., Disp: , Rfl:    vitamin E 200 UNIT capsule, Take 200 Units by mouth daily., Disp: , Rfl:       Objective:   Vitals:   05/01/24 1131  BP: 102/60  Pulse: 77  Temp: 97.9 F (36.6 C)  TempSrc: Oral  SpO2: 94%  Weight: 135 lb (61.2 kg)  Height: 5' 5 (1.651 m)    Estimated body mass index is 22.47 kg/m as calculated from the following:   Height as of this encounter: 5' 5 (1.651 m).   Weight as of this encounter: 135 lb (61.2 kg).  @WEIGHTCHANGE @  Filed Weights   05/01/24 1131  Weight: 135 lb (61.2 kg)     Physical Exam   General: No distress. Looks well O2 at rest: no Cane present: no Sitting in wheel chair: no Frail: no Obese: no Neuro: Alert and Oriented x 3. GCS 15. Speech normal Psych: Pleasant Resp:  Barrel Chest - no.  Wheeze - no, Crackles - mild bas, No overt respiratory distress CVS: Normal heart sounds. Murmurs - nono Ext: Stigmata of Connective Tissue Disease - no HEENT: Normal upper airway. PEERL +. No post nasal drip        Assessment/     Assessment & Plan COPD with chronic bronchitis and emphysema (HCC)  Pseudomonas aeruginosa colonization  Pseudomonas infection  IPF (idiopathic pulmonary fibrosis) (HCC)  Chronic obstructive pulmonary disease, unspecified COPD type (HCC)  Combined pulmonary fibrosis and emphysema (CPFE) (HCC)  Eosinophilia, unspecified type  COPD, frequent exacerbations  (HCC)    PLAN Patient Instructions  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  - - WORSE now due to Psuedomonas colonization/flare up - did not respond to doxycycline  and only transiently to Cipro  in July 2025  Plan - continue breo for emphysema - continue spiriva  - recheck sputum for gram stain and culture  - chck sputum for AFB - start 3mL , 3% hypertonic saline twice  daily  - cotninue mucinex  - consider flutter valve at next visit   Combined pulmonary fibrosis and emphysema (CPFE) (HCC) Pulmonary emphysema with fibrosis of lung (HCC) EOSINOPHILIA 200 cells/cumm COPD frequent flare ups  -recurrent flare ups now despite prednisone  repeatedly and and adding spiriva   Plan - Continue breo and spiriva  scheduled - STRT NUCALA   ILD (interstitial lung disease) (HCC) IPF Family history of pulmonary fibrosis Long-term exposure involving bird droppings  -You have extremely mild interstitial lung disease as of 2023 predominantly in the lung base.  But ever so slightly since 2015 it is slowly gotten worse.  Continuied worsening JAn 2025 CT chest At conference the concern is that this Is IPF .   -Did not qualify for clinical trial on account of combined emphysema  -  DID NOT TOLERATE Esbriet  low dose protocol due to side effects of fatigue and altered tase   Plan -- supportive care for now   RLL lung ndoule - dec 2023 =- sstable x 10 days; none reported summer 2025  - stable x 10 years  Plan  - no  further followup  Plan 8 weeks with APP or Dr Geronimo    FOLLOWUP    Return in about 8 weeks (around 06/26/2024) for with Dr Geronimo, Face to Face Visit, with any of the APPS.    SIGNATURE    Dr. Dorethia Geronimo, M.D., F.C.C.P,  Pulmonary and Critical Care Medicine Staff Physician, [facility] Director - Interstitial Lung Disease  Program  Pulmonary Fibrosis [facility] [place], [place], [address]  Pager: [phone], If no answer or between  15:00h - 7:00h: call [phone] Telephone: [phone]  12:23 PM 05/01/2024

## 2024-05-02 ENCOUNTER — Telehealth: Payer: Self-pay

## 2024-05-02 NOTE — Telephone Encounter (Signed)
 Copied from CRM #[id number]. Topic: Clinical - Medication Question >> May 01, 2024 12:17 PM Lavanda D wrote: Reason for CRM: Desiree with [facility] is calling in regards to the order for the  sodium chloride  HYPERTONIC 3 % nebulizer solution [[id number]]. Order says in Nebulizer 2x daily, however, the pharmacy says that what they have is and she would like to confirm if that is okay. Please call back to confirm. >> May 02, 2024  8:48 AM Leila BROCKS wrote: Verla from [facility] [phone] states called yesterday on Hypertonic 3% nebulizer solution needing clarification and has not heard from the offfice. Desiree wants to speak with nurse on this due to patient call in. Per CAL, CMA is unavailable. Please call back today.    Tried calling pharmacy VM/LM said it okay to fill the since it does not come in 3ML   -NFN

## 2024-05-02 NOTE — Telephone Encounter (Signed)
 Sputum collected at ov. Nothing further needed.

## 2024-05-07 ENCOUNTER — Other Ambulatory Visit (HOSPITAL_COMMUNITY): Payer: Self-pay

## 2024-05-07 ENCOUNTER — Telehealth: Payer: Self-pay

## 2024-05-07 DIAGNOSIS — J439 Emphysema, unspecified: Secondary | ICD-10-CM

## 2024-05-07 NOTE — Telephone Encounter (Signed)
 Received New start paperwork for NUCALA . Will update as we work through the benefits process.  Submitted a Prior Authorization request to [employer] for NUCALA  via CoverMyMeds. Will update once we receive a response.  Key: [id number]

## 2024-05-07 NOTE — Telephone Encounter (Signed)
 Received notification from [employer] regarding a prior authorization for NUCALA . Authorization has been APPROVED from 05/07/2024 to 09/03/2024. Approval letter sent to scan center.  Per test claim, copay for 28 days supply is $0.00  Patient can fill through [facility]: [phone]   Authorization # [id number]  PAP application sent to scan center for retention.

## 2024-05-08 MED ORDER — NUCALA 100 MG/ML ~~LOC~~ SOAJ
100.0000 mg | SUBCUTANEOUS | 0 refills | Status: DC
  Filled 2024-05-15: qty 1, 28d supply, fill #0

## 2024-05-08 NOTE — Telephone Encounter (Signed)
 Scheduled for new start Nucala  in office on 05/21/24 with pharmacy team. She is aware to look out for call from [place] or [place]. Rx sent to [facility].  Aleck Puls, PharmD, BCPS Clinical Pharmacist  [facility]

## 2024-05-15 ENCOUNTER — Other Ambulatory Visit (HOSPITAL_COMMUNITY): Payer: Self-pay

## 2024-05-15 ENCOUNTER — Other Ambulatory Visit: Payer: Self-pay

## 2024-05-15 NOTE — Progress Notes (Signed)
 Specialty Pharmacy Initial Fill Coordination Note  Erin Li is a 83 y.o. female contacted today regarding initial fill of specialty medication(s) Mepolizumab  (Nucala )   Patient requested Courier to Provider Office   Delivery date: No data recorded  Verified address: [address], [place], [place] [address]   Medication will be filled on 9/12.   Patient is aware of $0 copayment.

## 2024-05-16 ENCOUNTER — Other Ambulatory Visit: Payer: Self-pay

## 2024-05-21 ENCOUNTER — Ambulatory Visit

## 2024-05-21 DIAGNOSIS — J4489 Other specified chronic obstructive pulmonary disease: Secondary | ICD-10-CM

## 2024-05-21 DIAGNOSIS — J441 Chronic obstructive pulmonary disease with (acute) exacerbation: Secondary | ICD-10-CM

## 2024-05-21 NOTE — Patient Instructions (Signed)
 Your next Nucala  dose is due on 06/18/24 and every 28 days thereafter. This medicine should be stored in the refrigerator. If necessary, it can be stored at room temperature for no longer than 7 days. If stored at room temperature, do not put the medicine back in the refrigerator. If you miss a dose, take it as soon as you remember. If it is close to time for your next dose, skip the missed dose and continue with your usual schedule.  CONTINUE Breo Ellipta  100-32mcg (Inhale 1 puff into the lungs daily) and Spiriva  2.66mcg/act (Inhale 2 puffs into the lungs once daily).  Your prescription will be shipped from [facility]. Their phone number is [phone]. Someone will call to schedule shipment and confirm address. They will mail your medication to your home.  You will need to be seen by your provider in 3 to 4 months to assess how Nucala  is working for you. Please keep your follow-up appointment scheduled on 07/09/2024 with Erin Rouleau, NP.   Stay up to date on all routine vaccines: influenza, pneumonia, COVID19, Shingles  How to manage an injection site reaction: Remember the 5 C's: COUNTER - leave on the counter at least 30 minutes but up to overnight to bring medication to room temperature. This may help prevent stinging COLD - place something cold (like an ice gel pack or cold water bottle) on the injection site just before cleansing with alcohol. This may help reduce pain CLARITIN - use Claritin (generic name is loratadine) for the first two weeks of treatment or the day of, the day before, and the day after injecting. This will help to minimize injection site reactions CORTISONE CREAM - apply if injection site is irritated and itching CALL ME - if injection site reaction is bigger than the size of your fist, looks infected, blisters, or if you develop hives

## 2024-05-21 NOTE — Progress Notes (Signed)
 HPI Erin Li presents today, accompanied by spouse, to [facility] pharmacy team for Nucala  new start.  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 Per available prescription dispense history, there have been 8 dispenses of prednisone  in the last year.  Last OV with Dr. Geronimo 05/01/24. At that time, noted that chronic cough with chronic bronchitis worsened by Pseudomonas colonization. Recurrent COPD flares despite repeated courses of prednisone  and use of ICS/LABA/LAMA. Plan to add Nucala  to current inhaler regimen.  Respiratory Medications - Breo Ellipta  100-67mcg (Inhale 1 puff into the lungs daily),  - Spiriva  2.13mcg/act (Inhale 2 puffs into the lungs once daily),  - Proventil  (2.5mg /70mL) 0.083% neb soln (Take 3 mLs by nebulization every 6 hours as needed for wheezing or shortness of breath)   Erin Li reports no known adherence challenges  OBJECTIVE Allergies  Allergen Reactions   Simvastatin  Other (See Comments)    Severe myalgia eve3n with 10 mg daily   Digoxin  And Related Other (See Comments)    Red palms   Esbriet  [Pirfenidone ]     Fatigue, loss of taste   Metoprolol  Tartrate     Other Reaction(s): 2nd degree HB    Outpatient Encounter Medications as of 05/21/2024  Medication Sig   albuterol  (PROVENTIL ) (2.5 MG/3ML) 0.083% nebulizer solution Take 3 mLs (2.5 mg total) by nebulization every 6 (six) hours as needed for wheezing or shortness of breath.   apixaban  (ELIQUIS ) 5 MG TABS tablet Take 1 tablet (5 mg total) by mouth 2 (two) times daily.   Ascorbic Acid (VITAMIN C) 1000 MG tablet Take 1,000 mg by mouth daily.   b complex vitamins tablet Take 2 tablets by mouth daily.   BREO ELLIPTA  100-25 MCG/ACT AEPB inhale ONE PUFF into THE lungs EVERY DAY   calcium -vitamin D  250-100 MG-UNIT per tablet Take 1 tablet by mouth daily.     docusate sodium  (COLACE) 100 MG capsule Take 1 capsule (100 mg total) by mouth 2 (two) times daily.   furosemide  (LASIX ) 40 MG tablet TAKE 1 TABLET BY MOUTH DAILY AS NEEDED   glucosamine-chondroitin 500-400 MG tablet Take 1 tablet by mouth every morning.   guaiFENesin  (MUCINEX ) 600 MG 12 hr tablet Take 600 mg by mouth 2 (two) times daily.   JARDIANCE  10 MG TABS tablet Take 1 tablet (10 mg total) by mouth daily before breakfast.   LUTEIN PO Take 1 tablet by mouth daily.   magnesium  30 MG tablet Take 30 mg by mouth daily in the afternoon.   Melatonin 10 MG TABS Take 10 mg by mouth at bedtime.   Mepolizumab  (NUCALA ) 100 MG/ML SOAJ Inject 1 mL (100 mg total) into the skin every 28 (twenty-eight) days. Courier to pulm: [address], [place] [address]. Appt on 05/21/24.   omeprazole (PRILOSEC) 20 MG capsule Take 20 mg by mouth daily.   OVER THE COUNTER MEDICATION Take 2 mg by mouth daily in the afternoon. Biotin 2 mg   OVER THE COUNTER MEDICATION Place 1 drop into both eyes daily as needed (dry eye). OTC eye drop   PAPAYA PO Take 3 tablets by mouth daily as needed (heartburn).   potassium chloride  SA (KLOR-CON  M) 20 MEQ tablet TAKE 2 TABLETS (40 MEQ TOTAL) BY MOUTH DAILY AS NEEDED (WHEN YOU TAKE FUROSEMIDE ).   promethazine -dextromethorphan (PROMETHAZINE -DM) 6.25-15 MG/5ML syrup Take 5 mLs by mouth 4 (four) times daily  as needed for cough.   Red Yeast Rice 600 MG TABS Take 2,200 mg by mouth every evening.   selenium 50 MCG TABS Take 50 mcg by mouth daily.   sodium chloride  HYPERTONIC 3 % nebulizer solution 3 ML IN NEB TWICE DAILY DX J84.9   sotalol  (BETAPACE ) 80 MG tablet Take 1 tablet (80 mg total) by mouth daily.   Tiotropium Bromide  Monohydrate (SPIRIVA  RESPIMAT) 2.5 MCG/ACT AERS Inhale 2 puffs into the lungs daily.   valsartan  (DIOVAN ) 40 MG tablet Take 1 tablet (40 mg total) by mouth every evening. Pt has an office visit in May. Please keep your scheduled appt for further  refills   vitamin B-12 (CYANOCOBALAMIN) 1000 MCG tablet Take 1,000 mcg by mouth daily.   vitamin E 200 UNIT capsule Take 200 Units by mouth daily.   No facility-administered encounter medications on file as of 05/21/2024.     Immunization History  Administered Date(s) Administered   Fluad Quad(high Dose 65+) 06/20/2022   INFLUENZA, HIGH DOSE SEASONAL PF 06/05/2016, 06/04/2017, 05/20/2018, 05/06/2019   Influenza Split 06/04/2013, 05/05/2014   Influenza Whole 05/28/2012   Influenza, Quadrivalent, Recombinant, Inj, Pf 05/25/2021   Influenza,inj,Quad PF,6+ Mos 06/05/2015   Influenza,trivalent, recombinat, inj, PF 06/19/2023   Moderna Sars-Covid-2 Vaccination 11/10/2019, 12/08/2019, 06/30/2020, 01/11/2021, 08/02/2021   PNEUMOCOCCAL CONJUGATE-20 11/17/2020   Pfizer(Comirnaty)Fall Seasonal Vaccine 12 years and older 06/19/2023   Pneumococcal Conjugate-13 01/26/2015   Pneumococcal Polysaccharide-23 09/04/2005, 09/24/2012   Tdap 03/20/2012     PFTs    Latest Ref Rng & Units 03/06/2024    8:06 AM 12/04/2022    8:48 AM 09/22/2022   11:43 AM 08/01/2022    9:17 AM 05/18/2022    3:08 PM 04/06/2020   10:48 AM 07/09/2018   12:49 PM  PFT Results  FVC-Pre L 2.39  2.47  2.40  2.44  2.17  2.38  2.33   FVC-Predicted Pre % 88  90  87  87  78  83  79   Pre FEV1/FVC % % 65  62  59  57  65  65  59   FEV1-Pre L 1.55  1.53  1.41  1.40  1.41  1.55  1.38   FEV1-Predicted Pre % 77  75  69  67  67  72  62   DLCO uncorrected ml/min/mmHg 8.35  11.27  10.61  10.86  10.58  13.11  12.18   DLCO UNC% % 43  57  54  55  54  66  47   DLCO corrected ml/min/mmHg  11.27  10.61  10.86  10.58  13.11    DLCO COR %Predicted %  57  54  55  54  66    DLVA Predicted % 54  65  66  64  63  73  57      Eosinophils Most recent blood eosinophil count was 200 cells/microL taken on 10/17/23.   Assessment   Biologics training for mepolizumab  (Nucala )  Goals of therapy: Mechanism of Action: Not fully understood. It does act an  interleukin-5 (IL-5) antagonist monoclonal antibody that reduces the production and survival of eosinophils by blocking the binding of IL-5 to the alpha chain of the receptor complex on the eosinophil cell surface. Reviewed that Nucala  is add-on medication and Erin Li must continue maintenance inhaler regimen. Response to therapy: may take 3 months to 6 months to determine efficacy. Discussed that patients generally feel improvement sooner than 3 months.  Side effects: headache (19%), injection site reaction (  7-15%), antibody development (6%), backache (5%), fatigue (5%)  Dose: 100 mg subcutaneously every 4 weeks  Administration/Storage:  Reviewed administration sites of thigh or abdomen (at least 2-3 inches away from abdomen). Reviewed the upper arm is only appropriate if caregiver is administering injection  Do not shake the reconstituted solution as this could lead to product foaming or precipitation. Solution should be clear to opalescent and colorless to pale yellow or pale brown, essentially particle free. Small air bubbles, however, are expected and acceptable. If particulate matter remains in the solution or if the solution appears cloudy or milky, discard the solution.  Reviewed storage of medication in refrigerator. Reviewed that Nucala  can be stored at room temperature in unopened carton for up to 7 days.  Access: Approval of Nucala  through: insurance  Erin Li self-administered Nucala  100mg /mL in right lower abdomen using [facility]-supplied medication  Nucala  100mg /mL autoinjector pen NDC: [phone] Lot: [id number]  Expiration: 2028-JAN  Erin Li monitored for 30 minutes for adverse reaction.  Erin Li tolerated well.  Erin Li denies itchiness and irritation at injection.  PLAN Continue Nucala  100mg  every 4 weeks. Next dose is due 06/18/24 and every 4 weeks thereafter. Rx sent to: [facility]: [phone] . Erin Li provided with pharmacy phone number. Continue  maintenance asthma regimen of: Breo Ellipta  100-52mcg (Inhale 1 puff into the lungs daily), Spiriva  2.28mcg/act (Inhale 2 puffs into the lungs once daily)  All questions encouraged and answered.  Instructed Erin Li to reach out with any further questions or concerns.  Thank you for allowing pharmacy to participate in this Erin Li's care.  This appointment required 45 minutes of Erin Li care (this includes precharting, chart review, review of results, face-to-face care, etc.).

## 2024-05-23 ENCOUNTER — Other Ambulatory Visit: Payer: Self-pay

## 2024-05-23 MED ORDER — NUCALA 100 MG/ML ~~LOC~~ SOAJ
100.0000 mg | SUBCUTANEOUS | 3 refills | Status: DC
  Filled 2024-05-23 – 2024-06-06 (×2): qty 1, 28d supply, fill #0
  Filled 2024-07-02: qty 1, 28d supply, fill #1
  Filled 2024-08-01: qty 1, 28d supply, fill #2
  Filled 2024-09-01: qty 1, 28d supply, fill #3

## 2024-05-28 ENCOUNTER — Other Ambulatory Visit: Payer: Self-pay | Admitting: Internal Medicine

## 2024-06-04 ENCOUNTER — Other Ambulatory Visit (HOSPITAL_COMMUNITY): Payer: Self-pay

## 2024-06-05 ENCOUNTER — Other Ambulatory Visit: Payer: Self-pay

## 2024-06-05 ENCOUNTER — Encounter (INDEPENDENT_AMBULATORY_CARE_PROVIDER_SITE_OTHER): Payer: Self-pay

## 2024-06-05 DIAGNOSIS — S61451A Open bite of right hand, initial encounter: Secondary | ICD-10-CM | POA: Diagnosis not present

## 2024-06-05 DIAGNOSIS — W540XXA Bitten by dog, initial encounter: Secondary | ICD-10-CM | POA: Diagnosis not present

## 2024-06-06 ENCOUNTER — Other Ambulatory Visit: Payer: Self-pay | Admitting: Pharmacy Technician

## 2024-06-06 ENCOUNTER — Other Ambulatory Visit (HOSPITAL_COMMUNITY): Payer: Self-pay | Admitting: Physician Assistant

## 2024-06-06 ENCOUNTER — Other Ambulatory Visit: Payer: Self-pay

## 2024-06-06 NOTE — Progress Notes (Signed)
 Specialty Pharmacy Refill Coordination Note  Erin Li is a 83 y.o. female contacted today regarding refills of specialty medication(s) Mepolizumab  (Nucala )   Patient requested (Patient-Rptd) Delivery   Delivery date: 06/12/24 Verified address: (Patient-Rptd) [address] [place], [place]. [address]   Medication will be filled on 06/11/24.

## 2024-06-11 ENCOUNTER — Other Ambulatory Visit: Payer: Self-pay | Admitting: Nurse Practitioner

## 2024-06-11 ENCOUNTER — Telehealth: Payer: Self-pay | Admitting: *Deleted

## 2024-06-11 ENCOUNTER — Encounter: Payer: Self-pay | Admitting: Internal Medicine

## 2024-06-11 ENCOUNTER — Other Ambulatory Visit: Payer: Self-pay

## 2024-06-11 DIAGNOSIS — J441 Chronic obstructive pulmonary disease with (acute) exacerbation: Secondary | ICD-10-CM

## 2024-06-11 MED ORDER — CIPROFLOXACIN HCL 750 MG PO TABS: 750.0000 mg | ORAL_TABLET | Freq: Two times a day (BID) | ORAL | 0 refills | Status: AC

## 2024-06-11 NOTE — Telephone Encounter (Signed)
 Hx of pseudomonas. Will treat with ciprofloxacin  Twice daily for 7 days. Notify immediately of tendon pain or significant watery diarrhea. Use nebs and flutter valve ot help clear phlegm. OTC guaifenesin  1200 mg Twice daily. Needs OV or UC if no improvement. Thanks.

## 2024-06-11 NOTE — Telephone Encounter (Signed)
 I called and spoke with patient, provided recommendations per [facility].  She verified understanding.  Nothing further needed.

## 2024-06-11 NOTE — Telephone Encounter (Signed)
 I haven't seen patient before. Izetta saw patient in August and patient has a f/u with her in November, sending her this message

## 2024-06-11 NOTE — Telephone Encounter (Signed)
 Converted from [employer] message:  JERIE BASFORD Maddy to [facility] Clinical (supporting Dorethia Cave, MD) (Selected Message)     06/11/24  8:49 AM A great deal of coughing, waking me at night, bringing up yellowish colored phlegm.  Has beengoing on  for the past week. If you feel I need to start Prednisone  and an antibiotic  my pharmacy is [facility] in [place].  Thank you again   Patients states she has had symptoms for 1 week (coughing with yellow mucous and wheezing when she lays down at night).  She states she as collected two sputum sample over last month as requested by Dr. Cave.  No fever, chills or body aches.  Denies any chest/head congestion.  Sob is no worse than what is normal for her.  Using sodium chloride  nebulizer solution and that does help bring up mucous.  She said she does have some wheezing when she lays down at night.  She is requesting recommendations on how to treat this whether it be prednisone  and an antibiotic.  She is also wanting to know what cough medication she can us  to help control the cough.  Advised I would get a message to our provider of the day as Dr. Cave is not reachable today and once we hear back from the provider we will call her back with recommendations.  She verbalized understanding.  Beth, Please advise.  Thank you.

## 2024-06-12 ENCOUNTER — Other Ambulatory Visit: Payer: Self-pay

## 2024-06-12 ENCOUNTER — Encounter: Payer: Self-pay | Admitting: Internal Medicine

## 2024-06-13 NOTE — Telephone Encounter (Signed)
 Copied from CRM #[id number]. Topic: Clinical - Prescription Issue >> Jun 11, 2024  2:37 PM Rozanna MATSU wrote: Reason for CRM: April called about drug interact with sotlol for this ciprofloxacin  (CIPRO ) 750 MG tablet.The pharmacy [facility] - [place], [place] - [address] [place] [address] Phone: [phone]  The pharmacy is requesting a call about this thanks >> Jun 12, 2024 11:17 AM Benton KIDD wrote: Pharmacy is saying they will send medicine to patient because she has attempted three times to reach and speak with someone concerning the medication and the patient stays 30 minutes away so if there is a problem with the medication or patient taking medication please reach out to patient and this is from ms April at the [facility]  >> Jun 12, 2024 11:15 AM Benton KIDD wrote: Ms April is calling back concerning speaking with someone about a prescription for patient . She has called a few times . Called cal no response . Calling back now . No one is still responding . Tried calling clinic access line like three times. Please give pharmacist a call concerning this medication for patient please  Phone: [phone]  The pharmacy is requesting a call about this thanks >> Jun 12, 2024  8:36 AM Benton KIDD wrote: april from [facility] called yesterday about a drug interaction . trying to get this patiuent medication to them but need to know if its ok to fill prescription. No triage available to take call mrr blaitr said send a message back    Dr. Geronimo and Izetta can you please advise if it is okay to take Cipro  while the pt is taking Sotalol 

## 2024-06-13 NOTE — Progress Notes (Signed)
 Patient initiated Nucala  in clinic and was able to self- administer medication on 05/21/24. Tolerated well.   PLAN Continue Nucala  100mg  every 4 weeks. Next dose is due 06/18/24 and every 4 weeks thereafter. Rx sent to: [facility]: [phone] . Patient provided with pharmacy phone number. Continue maintenance regimen of: Breo Ellipta  100-13mcg (Inhale 1 puff into the lungs daily), Spiriva  2.6mcg/act (Inhale 2 puffs into the lungs once daily)   All questions encouraged and answered.  Instructed patient to reach out with any further questions or concerns.  Erin Li, PharmD, BCPS, CPP Clinical Pharmacist  [facility]

## 2024-06-13 NOTE — Telephone Encounter (Signed)
 I d/w Aleck Puls our pharmacist  I suspect the concern from [facility] with ciprofloxacin  and sotalol  is the QTc prolongation.  On the basis of review of the record she is taking Cipro  once before in the presence of sotalol  without any issues.  Her current QTcB is around 470 ms which is acceptable [less than 500 msc)  This she also has that we do not have a good oral alternative to ciprofloxacin  for Pseudomonas   Plan - Please explain risk versus benefit to both the patient and the [facility] pharmacy that we are aware of QTc prolongation risk but also no other alternative oral for Pseudomonas  - If the patient is okay with this go ahead and have the pharmacy fill the ciprofloxacin   - Please have the patient come to our office or go to any pain office to get an EKG midway through her ciprofloxacin  course [an EKG few days into her ciprofloxacin  is to monitor the QTc interval while on ciprofloxacin ]  - Please send this message back to me

## 2024-06-15 ENCOUNTER — Encounter: Payer: Self-pay | Admitting: Cardiology

## 2024-06-16 LAB — MYCOBACTERIA,CULT W/FLUOROCHROME SMEAR
MICRO NUMBER:: 16896504
SMEAR:: NONE SEEN
SPECIMEN QUALITY:: ADEQUATE

## 2024-06-16 NOTE — Telephone Encounter (Signed)
 I called [facility] pharmacy and spoke with pharm tech  She states that they went ahead and filled the prescription for Cipro  for this pt because we called over a week ago I called the pt and went over the below recommendations from Dr. Geronimo  She states she has the prescription but has not started yet  She would like to give it some more thought and will let us  know  I advised her that will need to schedule EKG midway through, so she needs to let us  know so we can plan accordingly  She verbalized understanding of this  Routing back to MR per his request.

## 2024-06-16 NOTE — Telephone Encounter (Signed)
 [place], any suggestions?? If no suggestions, how many days and probably patient to come in for EKG after 2 days so she would have adequate levels??

## 2024-06-16 NOTE — Telephone Encounter (Signed)
 Interaction of sotalol  and Cipro 

## 2024-06-16 NOTE — Telephone Encounter (Signed)
 She has pseudomonas so only alternatives are IV abx. Thanks!

## 2024-06-17 ENCOUNTER — Ambulatory Visit: Attending: Cardiology

## 2024-06-17 DIAGNOSIS — E7800 Pure hypercholesterolemia, unspecified: Secondary | ICD-10-CM | POA: Diagnosis not present

## 2024-06-17 DIAGNOSIS — I480 Paroxysmal atrial fibrillation: Secondary | ICD-10-CM

## 2024-06-17 DIAGNOSIS — Z5181 Encounter for therapeutic drug level monitoring: Secondary | ICD-10-CM | POA: Diagnosis not present

## 2024-06-17 DIAGNOSIS — R7303 Prediabetes: Secondary | ICD-10-CM | POA: Diagnosis not present

## 2024-06-17 NOTE — Telephone Encounter (Signed)
 Left message for patient to call office to arrange a nurse visit appointment for an EKG now and again in 1 week.

## 2024-06-17 NOTE — Telephone Encounter (Signed)
 Erin Li, can you schedule this pt an acute visit with MR 10/20? He has an open slot. Recent sputum culture positive for MAI She also has a productive cough with pseudomonas colonization; was supposed to start cipro  but has yet to start due to concerns from pharmacist. She did have nl QTc on last EKG so plan was to recheck at the end but cards wants her rechecked in 2 days, which they are coordinating. She just needs to see use to evaluate her in person and figure out next steps/best management. Thanks!!

## 2024-06-18 NOTE — Addendum Note (Signed)
 Addended by: LADONA MILAN on: 06/18/2024 06:56 AM   Modules accepted: Level of Service

## 2024-06-18 NOTE — Progress Notes (Signed)
 ICD-10-CM   1. Paroxysmal atrial fibrillation (HCC)  I48.0 EKG 12-Lead    2. Therapeutic drug monitoring  Z51.81 EKG 12-Lead     EKG 06/17/2024: Normal sinus rhythm at rate of 69 bpm, low voltage complexes otherwise normal EKG.  QTc 458.  PACs (1).  Compared to 01/15/2024, no change.

## 2024-06-18 NOTE — Telephone Encounter (Signed)
 Pls let patient know tht Dr Ladona and I have discussed. Ok for her to take cipro . H eis going to get her in to [place] office to do EKG monitoring     SIGNATURE    Dr. Dorethia Cave, M.D., F.C.C.P,  Pulmonary and Critical Care Medicine Staff Physician, [facility] Director - Interstitial Lung Disease  Program  Pulmonary Fibrosis [facility] at [facility] [place], [place], [address]   Pager: [phone], If no answer  -> Check AMION or Try [phone] Telephone (clinical office): [phone] Telephone (research): [phone]  8:52 AM 06/18/2024

## 2024-06-24 DIAGNOSIS — M810 Age-related osteoporosis without current pathological fracture: Secondary | ICD-10-CM | POA: Diagnosis not present

## 2024-06-24 DIAGNOSIS — I2510 Atherosclerotic heart disease of native coronary artery without angina pectoris: Secondary | ICD-10-CM | POA: Diagnosis not present

## 2024-06-24 DIAGNOSIS — J84112 Idiopathic pulmonary fibrosis: Secondary | ICD-10-CM | POA: Diagnosis not present

## 2024-06-24 DIAGNOSIS — E559 Vitamin D deficiency, unspecified: Secondary | ICD-10-CM | POA: Diagnosis not present

## 2024-06-24 DIAGNOSIS — I5022 Chronic systolic (congestive) heart failure: Secondary | ICD-10-CM | POA: Diagnosis not present

## 2024-06-24 DIAGNOSIS — I480 Paroxysmal atrial fibrillation: Secondary | ICD-10-CM | POA: Diagnosis not present

## 2024-06-24 DIAGNOSIS — E7800 Pure hypercholesterolemia, unspecified: Secondary | ICD-10-CM | POA: Diagnosis not present

## 2024-06-24 DIAGNOSIS — J449 Chronic obstructive pulmonary disease, unspecified: Secondary | ICD-10-CM | POA: Diagnosis not present

## 2024-06-24 DIAGNOSIS — G720 Drug-induced myopathy: Secondary | ICD-10-CM | POA: Diagnosis not present

## 2024-06-24 DIAGNOSIS — I10 Essential (primary) hypertension: Secondary | ICD-10-CM | POA: Diagnosis not present

## 2024-06-25 ENCOUNTER — Ambulatory Visit: Payer: Self-pay | Admitting: Cardiology

## 2024-06-25 ENCOUNTER — Ambulatory Visit: Attending: Cardiology | Admitting: *Deleted

## 2024-06-25 DIAGNOSIS — I480 Paroxysmal atrial fibrillation: Secondary | ICD-10-CM | POA: Diagnosis not present

## 2024-06-25 NOTE — Progress Notes (Addendum)
 EKG reviewed. Normal QT.

## 2024-06-25 NOTE — Patient Instructions (Signed)
Your physician recommends that you continue on your current medications as directed. Please refer to the Current Medication list given to you today. Your physician recommends that you schedule a follow-up appointment in: as planned 

## 2024-07-02 ENCOUNTER — Other Ambulatory Visit (HOSPITAL_COMMUNITY): Payer: Self-pay

## 2024-07-02 ENCOUNTER — Other Ambulatory Visit: Payer: Self-pay

## 2024-07-02 ENCOUNTER — Encounter (INDEPENDENT_AMBULATORY_CARE_PROVIDER_SITE_OTHER): Payer: Self-pay

## 2024-07-02 NOTE — Progress Notes (Signed)
 Specialty Pharmacy Refill Coordination Note  Erin Li is a 83 y.o. female contacted today regarding refills of specialty medication(s) Mepolizumab  (Nucala )   Patient requested Delivery   Delivery date: 07/09/24   Verified address: [address]   [place]  [address]   Medication will be filled on: 07/08/24

## 2024-07-08 ENCOUNTER — Other Ambulatory Visit: Payer: Self-pay

## 2024-07-08 ENCOUNTER — Other Ambulatory Visit (HOSPITAL_COMMUNITY): Payer: Self-pay

## 2024-07-09 ENCOUNTER — Encounter: Payer: Self-pay | Admitting: Nurse Practitioner

## 2024-07-09 ENCOUNTER — Telehealth: Payer: Self-pay | Admitting: Nurse Practitioner

## 2024-07-09 ENCOUNTER — Ambulatory Visit (INDEPENDENT_AMBULATORY_CARE_PROVIDER_SITE_OTHER): Admitting: Nurse Practitioner

## 2024-07-09 VITALS — BP 134/85 | HR 76 | Temp 98.5°F | Ht 65.0 in | Wt 140.6 lb

## 2024-07-09 DIAGNOSIS — A310 Pulmonary mycobacterial infection: Secondary | ICD-10-CM | POA: Diagnosis not present

## 2024-07-09 DIAGNOSIS — D721 Eosinophilia, unspecified: Secondary | ICD-10-CM | POA: Diagnosis not present

## 2024-07-09 DIAGNOSIS — R053 Chronic cough: Secondary | ICD-10-CM | POA: Diagnosis not present

## 2024-07-09 DIAGNOSIS — J849 Interstitial pulmonary disease, unspecified: Secondary | ICD-10-CM

## 2024-07-09 DIAGNOSIS — M791 Myalgia, unspecified site: Secondary | ICD-10-CM

## 2024-07-09 DIAGNOSIS — J4489 Other specified chronic obstructive pulmonary disease: Secondary | ICD-10-CM | POA: Diagnosis not present

## 2024-07-09 DIAGNOSIS — J479 Bronchiectasis, uncomplicated: Secondary | ICD-10-CM

## 2024-07-09 DIAGNOSIS — Z2239 Carrier of other specified bacterial diseases: Secondary | ICD-10-CM | POA: Diagnosis not present

## 2024-07-09 MED ORDER — SODIUM CHLORIDE 3 % IN NEBU: INHALATION_SOLUTION | RESPIRATORY_TRACT | 3 refills | Status: AC

## 2024-07-09 MED ORDER — ALBUTEROL SULFATE (2.5 MG/3ML) 0.083% IN NEBU: 2.5000 mg | INHALATION_SOLUTION | Freq: Four times a day (QID) | RESPIRATORY_TRACT | 3 refills | Status: AC | PRN

## 2024-07-09 MED ORDER — SODIUM CHLORIDE 3 % IN NEBU
INHALATION_SOLUTION | RESPIRATORY_TRACT | 3 refills | Status: DC
Start: 2024-07-09 — End: 2024-07-09

## 2024-07-09 NOTE — Patient Instructions (Addendum)
-  Continue Breo 1 puff daily. Brush tongue and rinse mouth afterwards -Continue Spiriva  2 puffs daily -Continue Albuterol  inhaler 2 puffs or 3 mL neb every 6 hours as needed for shortness of breath or wheezing. Notify if symptoms persist despite rescue inhaler/neb use.  -Use the nebs Twice daily followed by flutter valve 10 times  -Continue guaifenesin  to 1200 mg Twice daily  -Continue hypertonic saline nebs for congestion/cough -Continue Nucala  injections as scheduled. I'll reach out to the pharmacist about the muscle/joint pains you're experiencing   Start vest therapy Twice daily once received  I want you doing your albuterol  nebulizer treatments followed by hypertonic saline nebs and then the flutter valve ten times, twice a day.   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 Geronimo (1st) or Katie Kaiyan Luczak,NP. If symptoms do not improve or worsen, please contact office for sooner follow up or seek emergency care.

## 2024-07-09 NOTE — Telephone Encounter (Signed)
 Pt started on Nucala  September 2025, s/p 2 injections. New onset myalgias over the last 1-2 months since starting injections. No associated symptoms. Concerned for potential side effect. Would you recommend to hold injection and reassess response, or is this a benign side effect and ok to continue as long as it is not negatively impacting quality of life? Thanks!

## 2024-07-09 NOTE — Progress Notes (Signed)
 @Patient  ID: Erin Li, female    DOB: Aug 05, 1941, 83 y.o.   MRN: [id number]  Chief Complaint  Patient presents with   Medical Management of Chronic Issues    8w f u COPD     Referring provider: Gerome Brunet, DO  HPI: 83 year old female, former smoker followed for IPF and COPD.  She is patient [name] and last seen in office 05/01/2024.  Past medical history significant for PAF on Eliquis , hypertension, HF, history of lung nodule, HLD.  TEST/EVENTS:  12/04/2022 PFT: FVC 90, FEV1 91, ratio 62, DLCO 57 09/28/2023 HRCT chest: Atherosclerosis.  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  Moderate centrilobular and paraseptal emphysema. 03/06/2024 PFT: FVC 88, FEV1 77, ratio 65, DLCO 43 03/25/2024 CXR: Trace blunting of bilateral costophrenic angles otherwise lungs are clear 03/28/2024 sputum culture: pansensitive pseudomonas  04/25/2024 CT chest: atherosclerosis. Diffuse bronchial wall thickening. Moderate emphysema. Unchanged 1 cm nodule in RLL. Few scattered nodules in RUL, unchanged and up to 4 mm. Dependent tree-in-bud nodularity in RLL. Similar reticulation and subpleural scarring in b/l lower lobes. Traction btx, unchanged.  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 Geronimo.  Had called in in May for acute symptoms treated with doxycycline  and prednisone .  Failed to improve.  Recent chest x-ray was nonrevealing.  Sputum culture did grow out Pseudomonas.  Still having a lot of cough with yellow-brown sputum.  Also having a lot of complaints of tiredness, low motivation, fatigue with low-dose as Breo.  Coughs most nights until 3 AM.  Very irritable.  PFT is stable. Continue Breo and Spiriva .  Started on ciprofloxacin  for 14 days.  Monitor frequency of flareups.  If increases, can consider Daliresp.  Interstitial lung disease is mild.  Has progressed since 2015.  Concern for IPF.  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  Did not tolerate  Esbriet .  Advised to stop.  Prior lung nodule was stable x 10 years and no further follow-up dedicated.  04/16/2024: [name] with [name] is an 83 year old female who presents with persistent cough and wheezing. She is accompanied by her husband.  She began experiencing symptoms in May and had some slight improvement but then the symptoms recurred in July, including a productive cough and colored phlegm. A sputum culture performed around July 25th identified Pseudomonas, leading to a prescription of ciprofloxacin  for two weeks, which she is completing today. Despite initial improvement, about eight days ago, her cough has again worsened, with increased coughing and wheezing.  The wheezing seems to worsen it was before.  Her sputum production has actually decreased some and is starting to lighten up in color.  Now beige.  She has a history of using prednisone  in May, which was beneficial. Currently, she is taking Breo and Spiriva , and she has been using Mucinex . No fever or hemoptysis is reported, and her eating and drinking habits are normal. Her energy levels have improved since being on antibiotics and stopping the Esbriet . No swelling in her feet, weight gain, chest pain. She does not have a nebulizer machine at home and has not been using a flutter valve. No shortness of breath is reported.  05/01/2024: OV with Dr. Geronimo. Cough is some better. Still with some grey to yellow sputum. Finished cipro  for pseudomonas tx; failed doxycycline  prior to. Seemed to get better but then declined. Saw NP and treated with prednisone . Overall back to baseline regarding breathing and fatigue. Cough  is still worse. Slight increase on tree in bud infiltrates on CT. Recheck sputum cultures. Start hypertonci saline. Start Nucala  with prior peripheral eosinophils 200. Possible IPF given some slow progression. Did not tolerate esbriet  in past. Supportive care for now. Lung nodules have been stable; no  dedicated f/u needed.   07/09/2024: Today - follow up Discussed the use of AI scribe software for clinical note transcription with the patient, who gave verbal consent to proceed.  History of Present Illness  Erin Li is an 83 year old female with bronchiectasis who presents with a chronic productive cough. Her husband is with her.   She has a chronic productive cough that has persisted despite previous antibiotic treatments. Never really returned to her baseline after her flare up in July. She does feel better than when she called in October and received another course of Ciprofloxacin . Fatigue and breathing are at her baseline. She produces sputum daily which is gray to yellow in color. No fever or hemoptysis. No night sweats, anorexia, weight loss. She has been on Nucala  for two months now. She currently uses hypertonic saline for breathing treatments twice daily but has not been using albuterol . She does still take mucinex  twice daily. She uses Breo and Spiriva  for maintenance of her COPD.   She experiences muscle aches, particularly in her legs, which began after starting Nucala  injections. She's unsure if they are related. No dark colored urine, muscle cramps, changes in mobility/stability.   She uses a flutter valve for airway clearance. She experiences increased coughing at night. No significant GERD symptoms. She's on PPI therapy. No significant nasal symptoms. Has an occasional wheeze.     Allergies  Allergen Reactions   Simvastatin  Other (See Comments)    Severe myalgia eve3n with 10 mg daily   Digoxin  And Related Other (See Comments)    Red palms   Esbriet  [Pirfenidone ]     Fatigue, loss of taste   Metoprolol  Tartrate     Other Reaction(s): 2nd degree HB    Immunization History  Administered Date(s) Administered   Fluad Quad(high Dose 65+) 06/20/2022   INFLUENZA, HIGH DOSE SEASONAL PF 06/05/2016, 06/04/2017, 05/20/2018, 05/06/2019   Influenza Split  06/04/2013, 05/05/2014   Influenza Whole 05/28/2012   Influenza, Quadrivalent, Recombinant, Inj, Pf 05/25/2021   Influenza,inj,Quad PF,6+ Mos 06/05/2015   Influenza,trivalent, recombinat, inj, PF 06/19/2023   Moderna Sars-Covid-2 Vaccination 11/10/2019, 12/08/2019, 06/30/2020, 01/11/2021, 08/02/2021   PNEUMOCOCCAL CONJUGATE-20 11/17/2020   Pfizer(Comirnaty)Fall Seasonal Vaccine 12 years and older 06/19/2023   Pneumococcal Conjugate-13 01/26/2015   Pneumococcal Polysaccharide-23 09/04/2005, 09/24/2012   Tdap 03/20/2012    Past Medical History:  Diagnosis Date   COPD (chronic obstructive pulmonary disease) (HCC)    Coronary artery disease    Diverticulosis    HTN (hypertension)    Lung nodule     Tobacco History: Social History   Tobacco Use  Smoking Status Former   Current packs/day: 0.00   Average packs/day: 0.8 packs/day for 50.0 years (37.5 ttl pk-yrs)   Types: Cigarettes   Start date: 11/10/1961   Quit date: 11/11/2011   Years since quitting: 12.6  Smokeless Tobacco Never  Tobacco Comments   Encouraged to remain smoke free   Counseling given: Not Answered Tobacco comments: Encouraged to remain smoke free   Outpatient Medications Prior to Visit  Medication Sig Dispense Refill   albuterol  (PROVENTIL ) (2.5 MG/3ML) 0.083% nebulizer solution Take 3 mLs (2.5 mg total) by nebulization every 6 (six) hours as needed for wheezing  or shortness of breath. 75 mL 5   apixaban  (ELIQUIS ) 5 MG TABS tablet Take 1 tablet (5 mg total) by mouth 2 (two) times daily. 180 tablet 0   Ascorbic Acid (VITAMIN C) 1000 MG tablet Take 1,000 mg by mouth daily.     b complex vitamins tablet Take 2 tablets by mouth daily.     BREO ELLIPTA  100-25 MCG/ACT AEPB inhale ONE PUFF into THE lungs EVERY DAY 60 each 11   calcium -vitamin D  250-100 MG-UNIT per tablet Take 1 tablet by mouth daily.      docusate sodium  (COLACE) 100 MG capsule Take 1 capsule (100 mg total) by mouth 2 (two) times daily. 10 capsule 0    furosemide  (LASIX ) 40 MG tablet TAKE 1 TABLET BY MOUTH EVERY DAY AS NEEDED 90 tablet 1   glucosamine-chondroitin 500-400 MG tablet Take 1 tablet by mouth every morning.     guaiFENesin  (MUCINEX ) 600 MG 12 hr tablet Take 600 mg by mouth 2 (two) times daily.     JARDIANCE  10 MG TABS tablet Take 1 tablet (10 mg total) by mouth daily before breakfast. 30 tablet 11   LUTEIN PO Take 1 tablet by mouth daily.     magnesium  30 MG tablet Take 30 mg by mouth daily in the afternoon.     Melatonin 10 MG TABS Take 10 mg by mouth at bedtime.     Mepolizumab  (NUCALA ) 100 MG/ML SOAJ Inject 1 mL (100 mg total) into the skin every 28 (twenty-eight) days. 1 mL 3   omeprazole (PRILOSEC) 20 MG capsule Take 20 mg by mouth daily.     OVER THE COUNTER MEDICATION Take 2 mg by mouth daily in the afternoon. Biotin 2 mg     OVER THE COUNTER MEDICATION Place 1 drop into both eyes daily as needed (dry eye). OTC eye drop     PAPAYA PO Take 3 tablets by mouth daily as needed (heartburn).     potassium chloride  SA (KLOR-CON  M) 20 MEQ tablet TAKE 2 TABLETS (40 MEQ TOTAL) BY MOUTH DAILY AS NEEDED (WHEN YOU TAKE FUROSEMIDE ). 180 tablet 1   promethazine -dextromethorphan (PROMETHAZINE -DM) 6.25-15 MG/5ML syrup Take 5 mLs by mouth 4 (four) times daily as needed for cough. 180 mL 0   Red Yeast Rice 600 MG TABS Take 2,200 mg by mouth every evening.     selenium 50 MCG TABS Take 50 mcg by mouth daily.     sodium chloride  HYPERTONIC 3 % nebulizer solution 3 ML IN NEB TWICE DAILY DX J84.9 180 mL 11   sotalol  (BETAPACE ) 80 MG tablet Take 1 tablet (80 mg total) by mouth daily. 90 tablet 2   Tiotropium Bromide  Monohydrate (SPIRIVA  RESPIMAT) 2.5 MCG/ACT AERS INHALE TWO PUFFS into THE lungs EVERY DAY 12 g 3   valsartan  (DIOVAN ) 40 MG tablet Take 1 tablet (40 mg total) by mouth every evening. Pt has an office visit in May. Please keep your scheduled appt for further refills 90 tablet 2   vitamin B-12 (CYANOCOBALAMIN) 1000 MCG tablet Take 1,000  mcg by mouth daily.     vitamin E 200 UNIT capsule Take 200 Units by mouth daily.     No facility-administered medications prior to visit.     Review of Systems: as above    Physical Exam:  BP 134/85   Pulse 76   Temp 98.5 F (36.9 C) (Oral)   Ht 5' 5 (1.651 m)   Wt 140 lb 9.6 oz (63.8 kg)   LMP 09/04/1988 (  Approximate)   SpO2 93%   BMI 23.40 kg/m   GEN: Pleasant, interactive, well-appearing; in no acute distress HEENT:  Normocephalic and atraumatic. PERRLA. Sclera white. Nasal turbinates pink, moist and patent bilaterally. No rhinorrhea present. Oropharynx pink and moist, without exudate or edema. No lesions, ulcerations, or postnasal drip.  NECK:  Supple w/ fair ROM. No JVD present. No lymphadenopathy.   CV: RRR, no m/r/g, no peripheral edema. Pulses intact, +2 bilaterally. No cyanosis, pallor or clubbing. PULMONARY:  Unlabored, regular breathing. Decreased bibasilar airflow and end expiratory wheeze bilaterally A&P. No accessory muscle use.  GI: BS present and normoactive. Soft, non-tender to palpation.  MSK: No erythema, warmth or tenderness. Cap refil <2 sec all extrem.  Neuro: A/Ox3. No focal deficits noted.   Skin: Warm, no lesions or rashe Psych: Normal affect and behavior. Judgement and thought content appropriate.     Lab Results:  CBC    Component Value Date/Time   WBC 8.7 11/14/2023 1014   WBC 7.6 10/23/2023 0243   RBC 4.13 11/14/2023 1014   RBC 4.26 10/23/2023 0243   HGB 13.2 11/14/2023 1014   HCT 40.6 11/14/2023 1014   PLT 285 11/14/2023 1014   MCV 98 (H) 11/14/2023 1014   MCH 32.0 11/14/2023 1014   MCH 32.4 10/23/2023 0243   MCHC 32.5 11/14/2023 1014   MCHC 33.3 10/23/2023 0243   RDW 12.5 11/14/2023 1014   LYMPHSABS 1.2 10/17/2023 0805   MONOABS 0.8 10/17/2023 0805   EOSABS 0.2 10/17/2023 0805   BASOSABS 0.0 10/17/2023 0805    BMET    Component Value Date/Time   NA 144 11/14/2023 1014   K 4.3 11/14/2023 1014   CL 108 (H) 11/14/2023  1014   CO2 19 (L) 11/14/2023 1014   GLUCOSE 115 (H) 11/14/2023 1014   GLUCOSE 107 (H) 10/31/2023 1522   BUN 22 11/14/2023 1014   CREATININE 1.12 (H) 11/14/2023 1014   CALCIUM  9.4 11/14/2023 1014   GFRNONAA 59 (L) 10/31/2023 1522   GFRAA >60 08/12/2016 1156    BNP    Component Value Date/Time   BNP 464.6 (H) 10/31/2023 1510     Imaging:  No results found.   Administration History     None          Latest Ref Rng & Units 03/06/2024    8:06 AM 12/04/2022    8:48 AM 09/22/2022   11:43 AM 08/01/2022    9:17 AM 05/18/2022    3:08 PM 04/06/2020   10:48 AM 07/09/2018   12:49 PM  PFT Results  FVC-Pre L 2.39  2.47  2.40  2.44  2.17  2.38  2.33   FVC-Predicted Pre % 88  90  87  87  78  83  79   Pre FEV1/FVC % % 65  62  59  57  65  65  59   FEV1-Pre L 1.55  1.53  1.41  1.40  1.41  1.55  1.38   FEV1-Predicted Pre % 77  75  69  67  67  72  62   DLCO uncorrected ml/min/mmHg 8.35  11.27  10.61  10.86  10.58  13.11  12.18   DLCO UNC% % 43  57  54  55  54  66  47   DLCO corrected ml/min/mmHg  11.27  10.61  10.86  10.58  13.11    DLCO COR %Predicted %  57  54  55  54  66    DLVA Predicted %  54  65  66  64  63  73  57     No results found for: NITRICOXIDE      Assessment & Plan:   Assessment & Plan Bronchiectasis with chronic productive cough 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 Previous antibiotic treatments have been administered for pseudomonas colonization without significant change. Currently on Nucala , with two injections completed. Expected improvement in symptoms with continued use of therapy. Mild end expiratory wheeze on exam today. Does not appear to be in acute exacerbation. Advised to continue aggressive mucociliary clearance therapies. Continue maintenance regimen for COPD. Aware of risks of ICS with btx; however, at the time and in setting of significant COPD,  benefit outweighs risk. Will need to continue to monitor closely. Action plan in place. Will discuss myalgias with pharmacy team as this is a possible side effect of Nucala . Does not seem to be impacting daily life. Will check BMET; no s/s of rhabdomyolysis, which is not a known side effect of Nucala .  Patient has has daily productive cough lasting greater than 6  months requiring antibiotic therapy.  Flutter valve has been tried, but has not effectively mobilized secretions.  Considered and ruled out manual CPT as no consistent skilled caregiver available to perform therapy.  CT scan performed on 04/25/2024 confirms bronchiectasis.  Starting patient on vest therapy.   - Continue Nucala  therapy. - Initiated vest therapy with SmartVest for mucus clearance. - Perform albuterol  and hypertonic saline nebulization twice daily. - Use flutter valve for mucus clearance. - Consider elevating HOB up during sleep to aid drainage. 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 No prior infectious disease consultation. Referral to infectious disease for further evaluation and management. Discussion of potential MAC treatment involving multiple antibiotics for 6 months to a year. Unsure she would be able to tolerate this but worth while to consider given recent symptom burden and worsening tree-in-bud infiltrates. Will try to obtain second AFB culture, as ID typically prefers two positive cultures. No indication for bronchoscopy at this time. - Referred to infectious disease for consultation. - Provided sputum cup for expectorated  AFB - Instructed on proper collection  - Will discuss potential MAC treatment options with infectious disease.  Pseudomonas colonization Pansensitive. See above. Recommend repeat cultures if acute infectious symptoms occur.   Myalgia possibly  related to Nucala  therapy Reports muscle aches, particularly in the legs, since starting Nucala . Plan to discuss with pharmacist managing Nucala  therapy. No cramps or dark urine reported. - Checked lab work to assess kidney function and electrolytes - Will discuss myalgia with pharmacist managing Nucala  therapy.     Advised if symptoms do not improve or worsen, to please contact office for sooner follow up or seek emergency care.   I spent 45 minutes of dedicated to the care of this patient on the date of this encounter to include pre-visit review of records, face-to-face time with the patient discussing conditions above, post visit ordering of testing, clinical documentation with the electronic health record, making appropriate referrals as documented, and communicating necessary findings to members of the patients care team.  Comer LULLA Rouleau, NP 07/09/2024  Pt aware and understands NP's role.

## 2024-07-10 NOTE — Telephone Encounter (Signed)
 Good morning Erin Li, The patient would like to hold off on ordering the vest for now.  Please see patient comment and advise.  Thank you.

## 2024-07-10 NOTE — Telephone Encounter (Signed)
 Please notify pt of above response from the pharmacist. At our appt, her myalgias were not limiting her ability to perform ADLs or significantly impacting day to day life. If she feels they are tolerable and intermittent, we can continue Nucala . If not, we can see about switching to Dupixent. Thanks.

## 2024-07-10 NOTE — Telephone Encounter (Signed)
 Called and spoke to patient and relayed below message/recommendations.  She stated that myalgias is not limiting her ability to perform ADLs. She will continue Nucala  and if sx continue she would like to discuss alternative.  Nothing further needed at this time.

## 2024-07-18 ENCOUNTER — Ambulatory Visit (HOSPITAL_COMMUNITY): Payer: Self-pay

## 2024-07-23 DIAGNOSIS — J441 Chronic obstructive pulmonary disease with (acute) exacerbation: Secondary | ICD-10-CM | POA: Diagnosis not present

## 2024-07-24 ENCOUNTER — Encounter: Payer: Self-pay | Admitting: Internal Medicine

## 2024-07-25 ENCOUNTER — Encounter: Payer: Self-pay | Admitting: Internal Medicine

## 2024-07-25 NOTE — Telephone Encounter (Signed)
 Not sure if something you need to help with or pharmacy team

## 2024-07-25 NOTE — Telephone Encounter (Signed)
 Pharmacy team, Patient states that her Nucala  has been denied.  Can you please start a PA?  Thank you.

## 2024-07-26 ENCOUNTER — Encounter: Payer: Self-pay | Admitting: Internal Medicine

## 2024-07-28 ENCOUNTER — Telehealth: Payer: Self-pay

## 2024-07-28 ENCOUNTER — Other Ambulatory Visit (HOSPITAL_COMMUNITY): Payer: Self-pay

## 2024-07-28 NOTE — Telephone Encounter (Signed)
 Received message from pt that she has new insurance under [employer] and her Nucala  was denied coverage:   Was not sure if the pt meant a pa had already been denied. Attempted to submit pa through [facility] to [facility] [employer] for Nucala  but it was cancelled stating there is already a denial on file. Called [employer] at [phone] and requested they fax the denial letter to pharmacy team onbase. Will look into submitting appeal based on denial reasons.

## 2024-07-29 ENCOUNTER — Other Ambulatory Visit: Payer: Self-pay

## 2024-07-29 ENCOUNTER — Other Ambulatory Visit (HOSPITAL_COMMUNITY): Payer: Self-pay

## 2024-07-29 DIAGNOSIS — A498 Other bacterial infections of unspecified site: Secondary | ICD-10-CM

## 2024-07-29 NOTE — Telephone Encounter (Signed)
 Submitted an URGENT appeal to [employer] for NUCALA .   Fax [phone]  Will await response.

## 2024-08-01 ENCOUNTER — Other Ambulatory Visit (HOSPITAL_COMMUNITY): Payer: Self-pay

## 2024-08-04 ENCOUNTER — Other Ambulatory Visit: Payer: Self-pay

## 2024-08-04 NOTE — Progress Notes (Signed)
 Specialty Pharmacy Refill Coordination Note  Erin Li is a 83 y.o. female contacted today regarding refills of specialty medication(s) Mepolizumab  (Nucala )   Patient requested Delivery   Delivery date: 08/12/24   Verified address: [address]   [place] [address]   Medication will be filled on: 08/11/24

## 2024-08-11 ENCOUNTER — Other Ambulatory Visit (HOSPITAL_COMMUNITY): Payer: Self-pay

## 2024-08-11 ENCOUNTER — Other Ambulatory Visit: Payer: Self-pay

## 2024-08-11 ENCOUNTER — Encounter: Payer: Self-pay | Admitting: Internal Medicine

## 2024-08-13 ENCOUNTER — Other Ambulatory Visit (HOSPITAL_COMMUNITY): Payer: Self-pay

## 2024-08-13 ENCOUNTER — Encounter: Payer: Self-pay | Admitting: Cardiology

## 2024-08-13 MED ORDER — CIPROFLOXACIN HCL 250 MG PO TABS: 750.0000 mg | ORAL_TABLET | Freq: Two times a day (BID) | ORAL | 0 refills | Status: DC

## 2024-08-13 MED ORDER — PREDNISONE 10 MG PO TABS: ORAL_TABLET | ORAL | 0 refills | Status: DC

## 2024-08-13 NOTE — Telephone Encounter (Signed)
 Received approval letter, Nucala  was dispensed on 12/8.

## 2024-08-13 NOTE — Telephone Encounter (Signed)
 EKG 06/25/24 - Qtc Sputum July 2025 Pseudomonas  Will have to redo cipro   - there were concerns  Plan - .Ciprofloxacin  750 mg bid for 5 days  - Please take prednisone  40 mg x1 day, then 30 mg x1 day, then 20 mg x1 day, then 10 mg x1 day, and then 5 mg x1 day and stop

## 2024-08-13 NOTE — Telephone Encounter (Signed)
 Called [employer] to investigate.  Per representative, Nucala  appeal APPROVED 07/30/2024 through 01/26/2025.   I requested a fax showing this documentation. Provided Pharmacy team fax # [phone]

## 2024-08-15 ENCOUNTER — Telehealth: Payer: Self-pay

## 2024-08-15 MED ORDER — CIPROFLOXACIN HCL 250 MG PO TABS: 750.0000 mg | ORAL_TABLET | Freq: Two times a day (BID) | ORAL | 0 refills | Status: DC

## 2024-08-15 NOTE — Telephone Encounter (Signed)
 Copied from CRM #[id number]. Topic: Clinical - Medication Question >> Aug 15, 2024  9:41 AM Benton KIDD wrote: Reason for CRM: [facility] calling about a prescription that was called in on 12/10 the quantity does not align with the directions needing clarification  ciprofloxacin  (CIPRO ) 250 MG tablet  [facility] [phone] >> Aug 15, 2024 10:19 AM Ismael A wrote: Pt called in to get clarification on medication instructions/usage

## 2024-08-15 NOTE — Telephone Encounter (Signed)
 Copied from CRM #[id number]. Topic: Clinical - Prescription Issue >> Aug 14, 2024  2:56 PM Erin Li wrote: Reason for CRM: Pharmacy is calling with concern. They need a verbal authorization of if the directions are correct or if the quantity is correct, as they do not match. Please advise back at contact information listed.    Completed.

## 2024-08-15 NOTE — Telephone Encounter (Signed)
 I thought I typed 250mg  x 3 tablets (Total 750mg ) BID I.e 750mg  bid Can you call pharmacy please and ensure it is 750mg  bid x 5 days Patient needs to watch for tendonitis

## 2024-08-15 NOTE — Telephone Encounter (Signed)
 I called and spoke with Erin Li and clarified that the Cipro  should be a total of 750 mg twice daily for 5 days and the total tablets should be 30.  Nothing further needed.  I called the patient to let her know that I spoke with her pharmacy and clarified the total number of tablets that should be in the prescription.  She wanted to clarify that she was to take 3 tablets twice a day.  I told her yes.  She said she only received 14 tablets.  I let her know I had spoken with the pharmacy and they are getting more tablets ready for her.  I also gave her the information regarding tendonitis from Dr. Geronimo. She verbalized understanding.  Nothing further needed.

## 2024-08-20 ENCOUNTER — Other Ambulatory Visit: Payer: Self-pay | Admitting: Cardiology

## 2024-08-20 DIAGNOSIS — I480 Paroxysmal atrial fibrillation: Secondary | ICD-10-CM

## 2024-08-24 ENCOUNTER — Encounter: Payer: Self-pay | Admitting: Cardiology

## 2024-08-25 ENCOUNTER — Ambulatory Visit (HOSPITAL_COMMUNITY)
Admission: RE | Admit: 2024-08-25 | Discharge: 2024-08-25 | Disposition: A | Discharge status: Home / Self Care | Payer: Self-pay | Source: Ambulatory Visit | Attending: Cardiology | Admitting: Cardiology

## 2024-08-25 ENCOUNTER — Ambulatory Visit: Payer: Self-pay | Admitting: Cardiology

## 2024-08-25 DIAGNOSIS — I480 Paroxysmal atrial fibrillation: Secondary | ICD-10-CM | POA: Insufficient documentation

## 2024-08-25 DIAGNOSIS — I5022 Chronic systolic (congestive) heart failure: Secondary | ICD-10-CM | POA: Diagnosis present

## 2024-08-25 LAB — ECHOCARDIOGRAM COMPLETE
Area-P 1/2: 4.56 cm2
P 1/2 time: 241 ms
S' Lateral: 2.7 cm

## 2024-09-01 ENCOUNTER — Other Ambulatory Visit: Payer: Self-pay

## 2024-09-02 ENCOUNTER — Other Ambulatory Visit: Payer: Self-pay

## 2024-09-02 NOTE — Progress Notes (Signed)
 Specialty Pharmacy Refill Coordination Note  Erin Li is a 83 y.o. female contacted today regarding refills of specialty medication(s) Mepolizumab  (Nucala )   Patient requested (Patient-Rptd) Delivery   Delivery date: 09/03/24   Verified address: [address]  [place] [address]   Medication will be filled on: 09/02/24

## 2024-09-05 ENCOUNTER — Ambulatory Visit: Payer: Self-pay | Admitting: Internal Medicine

## 2024-09-05 ENCOUNTER — Telehealth: Payer: Self-pay

## 2024-09-05 ENCOUNTER — Encounter: Payer: Self-pay | Admitting: Internal Medicine

## 2024-09-05 MED ORDER — AMOXICILLIN-POT CLAVULANATE 875-125 MG PO TABS: 1.0000 | ORAL_TABLET | Freq: Two times a day (BID) | ORAL | 0 refills | Status: DC

## 2024-09-05 NOTE — Telephone Encounter (Signed)
 Dr Geronimo, please advise mychart message

## 2024-09-05 NOTE — Telephone Encounter (Signed)
 Called patient.  Gave all information per [place].  Patient verbalized understanding and will follow all instructions  NFN at this time.

## 2024-09-05 NOTE — Telephone Encounter (Signed)
 FYI Only or Action Required?: Action required by provider: request for appointment.  Patient is followed in Pulmonology for COPD, last seen on 07/09/2024 by Malachy Comer GAILS, NP.  Called Nurse Triage reporting Cough.  Symptoms began several days ago.  Interventions attempted: OTC medications: ASA, Maintenance inhaler, and Nebulizer treatments.  Symptoms are: gradually worsening.  Triage Disposition: See HCP Within 4 Hours (Or PCP Triage)  Patient/caregiver understands and will follow disposition?: No, wishes to speak with PCP   E2C2 Pulmonary Triage - Initial Assessment Questions Chief Complaint (e.g., cough, sob, wheezing, fever, chills, sweat or additional symptoms) *Go to specific symptom protocol after initial questions. Cough, fever  How long have symptoms been present? 4 days  Have you tested for COVID or Flu? Note: If not, ask patient if a home test can be taken. If so, instruct patient to call back for positive results. No  MEDICINES:   Have you used any OTC meds to help with symptoms? Yes If yes, ask What medications? Aspirin  Have you used your inhalers/maintenance medication? Yes If yes, What medications? nebulizer  If inhaler, ask How many puffs and how often? Note: Review instructions on medication in the chart. Spiriva , Breo  OXYGEN : Do you wear supplemental oxygen ? No If yes, How many liters are you supposed to use? na  Do you monitor your oxygen  levels? Yes If yes, What is your reading (oxygen  level) today? 93%  What is your usual oxygen  saturation reading?  (Note: Pulmonary O2 sats should be 90% or greater) 95%   Copied from CRM #[id number]. Topic: Clinical - Red Word Triage >> Sep 05, 2024  9:12 AM Ismael A wrote: Red Word that prompted transfer to Nurse Triage: pt is having a flare up - Have been coughing and bringing up a lot of phlegm for about 4 days brownish yellow,  my temperaure is 100.5 for 3 days. Denies sob,  wheezing, chest pain/tightness Reason for Disposition  [1] MILD difficulty breathing (e.g., minimal/no SOB at rest, SOB with walking, pulse < 100) AND [2] still present when not coughing  Answer Assessment - Initial Assessment Questions 1. ONSET: When did the cough begin?      4 days ago 2. SEVERITY: How bad is the cough today?      Coughing heavy phlegm- worse when laying down 3. SPUTUM: Describe the color of your sputum (e.g., none, dry cough; clear, white, yellow, green)     Yellow/gray 4. HEMOPTYSIS: Are you coughing up any blood? If Yes, ask: How much? (e.g., flecks, streaks, tablespoons, etc.)     no 5. DIFFICULTY BREATHING: Are you having difficulty breathing? If Yes, ask: How bad is it? (e.g., mild, moderate, severe)      no 6. FEVER: Do you have a fever? If Yes, ask: What is your temperature, how was it measured, and when did it start?     Yes-100.5, oral 7. CARDIAC HISTORY: Do you have any history of heart disease? (e.g., heart attack, congestive heart failure)      Hx heart failure 8. LUNG HISTORY: Do you have any history of lung disease?  (e.g., pulmonary embolus, asthma, emphysema)     COPD 9. PE RISK FACTORS: Do you have a history of blood clots? (or: recent major surgery, recent prolonged travel, bedridden)     na 10. OTHER SYMPTOMS: Do you have any other symptoms? (e.g., runny nose, wheezing, chest pain)       no  Protocols used: Cough - Acute Productive-A-AH

## 2024-09-05 NOTE — Telephone Encounter (Signed)
 I will send in 10 day course of Augmentin  for suspected acute exacerbation of her bronchiectasis. She should take a flu and covid test at home. Tylenol  650mg  every 4-6 hours for fever (do not exceed 4gm in 24 hours)  Keep apt with Dr. Geronimo on 09/16/24. If symptoms worsen present to UC

## 2024-09-08 ENCOUNTER — Ambulatory Visit: Admitting: Internal Medicine

## 2024-09-08 ENCOUNTER — Encounter: Payer: Self-pay | Admitting: Internal Medicine

## 2024-09-08 VITALS — BP 90/56 | HR 97 | Temp 98.4°F | Ht 65.0 in | Wt 142.6 lb

## 2024-09-08 DIAGNOSIS — Z2239 Carrier of other specified bacterial diseases: Secondary | ICD-10-CM | POA: Diagnosis not present

## 2024-09-08 DIAGNOSIS — J439 Emphysema, unspecified: Secondary | ICD-10-CM | POA: Diagnosis not present

## 2024-09-08 DIAGNOSIS — R053 Chronic cough: Secondary | ICD-10-CM

## 2024-09-08 DIAGNOSIS — J8410 Pulmonary fibrosis, unspecified: Secondary | ICD-10-CM | POA: Diagnosis not present

## 2024-09-08 DIAGNOSIS — J849 Interstitial pulmonary disease, unspecified: Secondary | ICD-10-CM | POA: Diagnosis not present

## 2024-09-08 DIAGNOSIS — Z836 Family history of other diseases of the respiratory system: Secondary | ICD-10-CM

## 2024-09-08 DIAGNOSIS — R911 Solitary pulmonary nodule: Secondary | ICD-10-CM

## 2024-09-08 DIAGNOSIS — J411 Mucopurulent chronic bronchitis: Secondary | ICD-10-CM | POA: Diagnosis not present

## 2024-09-08 DIAGNOSIS — J471 Bronchiectasis with (acute) exacerbation: Secondary | ICD-10-CM

## 2024-09-08 DIAGNOSIS — D721 Eosinophilia, unspecified: Secondary | ICD-10-CM

## 2024-09-08 DIAGNOSIS — J441 Chronic obstructive pulmonary disease with (acute) exacerbation: Secondary | ICD-10-CM

## 2024-09-08 MED ORDER — PROMETHAZINE-DM 6.25-15 MG/5ML PO SYRP: 5.0000 mL | ORAL_SOLUTION | Freq: Four times a day (QID) | ORAL | 0 refills | Status: AC | PRN

## 2024-09-08 MED ORDER — PREDNISONE 10 MG PO TABS: ORAL_TABLET | ORAL | 0 refills | Status: DC

## 2024-09-08 NOTE — Telephone Encounter (Signed)
 Guuys I was PTO. Not sure why sick message in copd patient with fever was sent to me.  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 I recntly called in antibiotics exactly a month ago. Please find out of If still sick. We might need to get her to see DR Darlean in [place] or give an acuute withany provider this week  Pls let me know

## 2024-09-08 NOTE — Telephone Encounter (Signed)
 Called and spoke with patient, offered acute appointment for today.  She will come in at 3:45 pm for a 4 pm appointment. She is not feeling any better from last week.  She verbalized understanding.  Nothing further needed.

## 2024-09-08 NOTE — Patient Instructions (Addendum)
"    ICD-10-CM   1. Bronchiectasis with acute exacerbation (HCC)  J47.1 Ambulatory referral to Pharmacotherapy Clinic    2. Chronic cough  R05.3 Ambulatory referral to Pharmacotherapy Clinic    3. 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 Ambulatory referral to Pharmacotherapy Clinic        -due to Psuedomonas colonization/flare up - did not respond to doxycycline  and only transiently to Cipro  in July 2025 and needed to be on repeat antibiotic early Dec 2025 and again Jan 2026 - having reepated flare up and ongoing sputum production like bronchiectasis patient - stil with flareups despite new Nucala  start   Plan - continue breo for emphysema - continue spiriva  - cotninue 3mL , 3% hypertonic saline twice daily  - cotninue mucinex  - consider flutter valve at next visit - START BRINSUPRI - finish Augmention as called in   - do 5 day  prednisone  taper - do promethamzine cough syrup as neded - consider CT chest if still not better   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  But ever so slightly since 2015 it is slowly gotten worse.  Continuied worsening JAn 2025 CT chest At conference the concern is that this Is IPF .   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

## 2024-09-08 NOTE — Progress Notes (Signed)
 "      OV 04/10/2017   Chief Complaint  Patient presents with   Follow-up    Pt states her breathing is unchanged since last OV. Pt states she has mild non prod cough. Pt denies CP/tightness, f/c/s, and chest congestion.    Follow-up COPD but now has concerned that she has developed interstitial lung disease  She is here with her husband. Back in 2015 a high-resolution CT chest because I thought I heard crackles and thoracic radiology ruled out interstitial lung disease. In the interim she's had lung cancer screening CT chest at overall stability in her pulmonary function test. But there was concern because of crackles that she might have interstitial lung disease so we did a high-resolution CT chest and July 2018 and thoracic radiologist reported as definite precedence of ILD although it is not a UIP pattern. [name] She does say that her son and over the lung transplant in [place] 8 years ago at age of 35 because of IPF. She did have autoimmune panel that is only trace positive for ENP . 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 She denies any humidifier or mildew or mold in the house. The CT chest also shows coronary artery calcification. Of note I personally visualized the CT chest.  ACCP ILD questiin  - symptioms: occ cough x 2 years with some night cough and mild phleogn ona nd off. Very mild non troubling dyspnea x 3 years - pasth medical : GERD +, mild dysphagia + - personal exposure x -: +ver for remote use of rec / street drugs. Cig smoked age 7 though 71 at 23 cigs/dat - fam hx: COPD +, son with transplant for ?  IPF at age 61 - home exposurE: does have hot tube - uses it once a month. 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 Range 04/03/2017 10:18  CK Total Latest Ref Range: 7 - 177 U/L 63  CK-MB Latest Ref Range: 0.3 - 4.0 ng/mL 2.4  Aldolase Latest Ref Range: <=8.1 U/L 3.5  Sed Rate Latest Ref Range: 0 - 30 mm/hr 20  Anit Nuclear Antibody(ANA) Latest Ref Range: NEGATIVE  NEG  Angiotensin-Converting Enzyme Latest Ref Range: 9 - 67 U/L 67  Cyclic Citrullin Peptide Ab Latest Units: Units <16  ds DNA Ab Latest Units: IU/mL 1  ENA RNP Ab Latest Ref Range: 0.0 - 0.9 AI 1.4 (H)  RA Latex Turbid. Latest Ref Range: <14 IU/mL <14  SSA (Ro) (ENA) Antibody, IgG Latest Ref Range: <1.0 NEG AI  <1.0 NEG  SSB (La) (ENA) Antibody, IgG Latest Ref Range: <1.0 NEG AI  <1.0 NEG  Scleroderma (Scl-70) (ENA) Antibody, IgG Latest Ref Range: <1.0 NEG AI  <1.0 NEG    OV 09/28/2017  Chief Complaint  Patient presents with   Follow-up    PFT done today. States she has been doing good and has no complaints of cough, SOB, or CP.    Fu mixed empyhsema and pulmonary fibrosis  For  years of only known who is a COPD patient with a family history of pulmonary fibrosis.  But it appears that in 2018 she started developing signs of pulmonary  fibrosis with crackles and findings of indeterminate UIP on CT chest July 2018.  She has family history of pulmonary fibrosis and that her son had lung transplant for the same.  She now presents for follow-up.  In the interim she started feeling better.  She is less dyspneic.  She continues with the Brio.  There are no new issues.  Pulmonary function test done as documented below shows an improvement.  Therefore instead of biopsy she feels keeping an eye on this is a better approach.    Walking desaturation test on 09/28/2017 185 feet x 3 laps on ROOM AIR:  did NOT desaturate. Rest pulse ox was 99%, final pulse ox was96 %. HR response 78 /min at rest to 105/min at peak exertion. Patient Mahlet Rhudy  Did nto Desaturate < 88% . Jhoselin Henard yes  Desaturated </= 3% points.  Malu Manocchio yes did get tachyardic   OV 01/01/2018  Chief Complaint  Patient presents with   Follow-up    PFT done today.  Pt states she has been doing good since last visit. Had a cold mid January 2019 but has been better since. Denies any complaints of cough, SOB, or CP.   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 She continues on Brio. Since last visit January 2019 she continues to feel better. COPD cat score is very minimal as documented below. She had pulmonary function test today and this shows stability in her FVC after initial improvement. Her DLCO is even better. She is on observation course regarding her ILD given the improvement in her pulmonary function test. Her last CT scan of the chest was July 2018        OV 07/09/2018  Subjective:  Patient ID: [name], female , DOB: 09-06-1940 , age 40 y.o. , MRN: [id number] , ADDRESS: [address] [place] [address]   07/09/2018 -   Chief Complaint  Patient presents with   Consult    Alot of coughing producing mucus no color at this time.     HPI Synai Puzzo 84 y.o. 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  She tells me that for the last 3 weeks has had increased cough with congestion and white sputum more than baseline but activities of daily living and shortness of breath or guarding is all unchanged and she has good effort tolerance.  There are no new other issues.  In terms of ILD: Last visit I thought I will be in improved based on improvement in pulmonary function test but actually that might of been a red herring because today's pulmonary function test shows a decline which is in line with previous pulmonary function test showing stability.  She certainly feels stable.  In  July 2019 she had a low-dose CT scan for lung cancer and this ruled out lung cancer on screening.  They were able to report some mildly changes at the base.  She is not interested in surgical lung biopsy because her son who had pulmonary fibrosis finally died from 8 years of transplant from pulmonary embolism, stroke and acute renal failure and chronic critical illness in the rehab facility.      OV 10/08/2019  Subjective:  Patient ID: [name], female , DOB: December 14, 1940 ,  age 33 y.o. , MRN: [id number] , ADDRESS: [address] [place] [address]   10/08/2019 -   Chief Complaint  Patient presents with   Follow-up    Pt states she has been doing well since last visit and states her breathing has been doing well. Pt will have an occ cough.   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  Last seen in #2019.  She presents with her husband.  Since the onset of the COVID-19 pandemic there have been in the house and socially isolated.  She has been doing well without any problems overall stable.  She not exercising as much as she used to.  She not taking any inhalers for her COPD.  She only takes Mucinex .  Therefore her cough symptom with mucus is slightly worse than before.  She had high-resolution CT scan of the chest which I reviewed the report and agree with the findings and I interpreted as well with visualization: She has emphysema associated with alternate diagnosis pattern.  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  She still grieving from her son who died in 24-Sep-2019from pulmonary fibrosis.    OV 06/01/2020   Subjective:  Patient ID: [name], female , DOB: [birth year], age 73 y.o. years. , MRN: [id number],  ADDRESS: [address] [place]  [address] PCP  Luke Agent, MD Providers : Treatment Team:  Attending Provider: Geronimo Amel, MD   Chief Complaint  Patient presents with   Follow-up    ILD, no concerns    predominantly COPD with some amount of basal pulmonary fibrosis  N(atlernate to UIP)  in the setting of her son having pulmonary fibrosis alt- decieasedd 2019   HPI Timarie Rosamilia 84 y.o. -presents for follow-up.  Last seen February 2021.  Since then she is doing stable.  She continues to be stable.  Her symptom scores are very minimal and shows stability.  She had pulmonary function test August 2021 and compared to November 2019 she is stable.  Her walking desaturation test is also stable.  Her last CT scan was January 2021.  She prefers to have monitoring for pulmonary fibrosis because of her son's issue.  She likes the strategy of alternating pulmonary function test and also CT scan of the chest.      OV 12/06/2021  Subjective:  Patient ID: [name], female , DOB: 21-Jan-1941 , age 75 y.o. , MRN: [id number] , ADDRESS: [address] [place] [address] PCP Luke Agent, MD Patient Care Team: Luke Agent, MD as PCP - General (Internal Medicine)  This Provider for this visit: Treatment Team:  Attending Provider: Geronimo Amel, MD   Follow-up emphysema with some amount of ILD.  Last seen in 2020-05-28.  It is now 18 months since I last saw her.  Doing well overall.  Last month she did have a flareup requiring antibiotics and prednisone  for COPD exacerbation.  She continues on Breo.  No new health issues.  April 23, 2022 she plans to go to [place].  She is wanting antibiotic and prednisone .  Her last pulmonary function test was a year and a half ago.  Her last CT scan of the chest was a year ago or more.  She is agreeable to have follow-up reassessment  done.  Symptom score is as below and stable.  Walking desaturation test shows a tendency to desaturate but still acceptable.    OV  05/18/2022   Subjective:  Patient ID: Creston GORMAN Silvan, female , DOB: 1941-02-10, age 19 y.o. years. , MRN: [id number],  ADDRESS: [address] [place] [address] PCP  Gerome Brunet, DO Providers : Treatment Team:  Attending Provider: Geronimo Amel, MD Patient Care Team: Gerome Brunet, DO as PCP - General (Family Medicine)    Chief Complaint  Patient presents with   Follow-up    PFT performed today.  Pt states she has been doing okay since last visit. States for the past 2 weeks she has been coughing a lot getting up white phlegm and states cough is worse at night.    Follow-up emphysema with some amount of ILD   HPI Vayda S Falotico 64 y.o. -[son also has ILD].  Returns for follow-up.  In August 2023 she went to [place] with her female side of the family.  She return May 02, 2022.  She had a great time.  No issues walking around the.  Then after coming back for the last 2 weeks she has respiratory symptoms particularly increased cough for the last 2 to 3 weeks associated with white sputum occasionally yellow.  Definitely worse than baseline.  Moderate in severity and but no fever.  No chills no worsening shortness of breath.  No wheezing.  No edema no orthopnea no chills.  However pulmonary function test today shows a decline [see below].  Despite this increase in symptoms her subjective symptom score below is stable.  Of note her husband is here with her today.    PFT   Narrative & Impression  CLINICAL DATA:  Interstitial lung disease   EXAM: CT CHEST WITHOUT CONTRAST   TECHNIQUE: Multidetector CT imaging of the chest was performed following the standard protocol without intravenous contrast. High resolution imaging of the lungs, as well as inspiratory and expiratory imaging, was performed.   RADIATION DOSE REDUCTION: This exam was performed according to the departmental dose-optimization program which includes automated exposure control, adjustment of the  mA and/or kV according to patient size and/or use of iterative reconstruction technique.   COMPARISON:  10/01/2020, 09/23/2019, 09/27/2018   FINDINGS: Cardiovascular: Aortic atherosclerosis. Normal heart size. Left and right coronary artery calcifications. No pericardial effusion.   Mediastinum/Nodes: No enlarged mediastinal, hilar, or axillary lymph nodes. Thyroid  gland, trachea, and esophagus demonstrate no significant findings.   Lungs/Pleura: Moderate centrilobular and paraseptal emphysema. Unchanged mild pulmonary fibrosis in a pattern with apical to basal gradient, featuring irregular peripheral interstitial opacity, septal thickening, and ground-glass, mild, tubular bronchiectasis, with small areas of subpleural bronchiolectasis at the lung bases. No significant air trapping on expiratory phase imaging. Multiple small bilateral pulmonary nodules are stable and benign, largest a 0.9 cm nodule of the superior segment right lower lobe (series 4, image 57). No pleural effusion or pneumothorax.   Upper Abdomen: No acute abnormality.   Musculoskeletal: No chest wall abnormality. No suspicious osseous lesions identified.   IMPRESSION: 1. Unchanged mild pulmonary fibrosis in a pattern with apical to basal gradient, featuring irregular peripheral interstitial opacity, septal thickening, and ground-glass, with small areas of subpleural bronchiolectasis at the lung bases. Fibrotic findings are not significantly changed compared to immediate prior examination, although as previously reported are worsened over a longer period of time dating back to at least 2015. Findings are categorized as probable UIP per consensus guidelines: Diagnosis  of Idiopathic Pulmonary Fibrosis: An Official ATS/ERS/JRS/ALAT Clinical Practice Guideline. Am JINNY Honey Crit Care Med Vol 198, Iss 5, [id number], May 05 2017. 2. Multiple small bilateral pulmonary nodules are stable and definitively benign. 3.  Emphysema. 4. Coronary artery disease.   Aortic Atherosclerosis (ICD10-I70.0) and Emphysema (ICD10-J43.9).     Electronically Signed   By: Marolyn JONETTA Jaksch M.D.   On: 02/25/2022 17:51    No results found.     OV 08/01/2022  Subjective:  Patient ID: Creston GORMAN Silvan, female , DOB: Jan 07, 1941 , age 4 y.o. , MRN: [id number] , ADDRESS: [address] [place] [address] PCP Gerome Brunet, DO Patient Care Team: Gerome Brunet, DO as PCP - General (Family Medicine)  This Provider for this visit: Treatment Team:  Attending Provider: Geronimo Amel, MD  Follow-up emphysema with some amount of ILD  08/01/2022 -   Chief Complaint  Patient presents with   Follow-up     HPI Francisco REANNA SCOGGIN 84 y.o. -presents with her husband.  After the recent flareup she is better.  However she ended up with back pain and sciatica 6 weeks ago.  She got prednisone  course but it did not help.  Since then she has not acupuncture with massage therapy and she is significantly better although she still requiring a cane.  Therefore we did not walk her today.  She had a repeat pulmonary function test that shows slight improvement in FVC but the DLCO is still the same.  Her FVC over the last 5 to 8 years is range bound but the DLCO seems to taken a turn for the worse compared to several years ago.  Still the decline is not greater than 5 to 10% in the last 1 or 2 years to meet progressive phenotype definition.  I did visualize the CT scan currently with and also compared it to the old one.  There is definitely more ILD findings now compared to 2015 but is reported she does not meet the classic definition of progressive phenotype.  The pet bird she had is now deceased for the last 5 years.  Her son himself had IPF/ILDand had lung transplant several years ago.  She is willing to see genetics counselor.  Her husband prefers an aggressive approach with potential antifibrotic's because of the slow progression in  the future course being uncertain.  She prefers a more wait and watch approach.  We took a shared decision making to discuss in case conference repeat pulmonary function test and then decide    Husband is particularly worried about chronic cough.  Is particular worse in the night.  Is been going on for a long time but worse in the last 6 months definitely much worse at night.  She taking fish oil for many years but recently she has also been having heartburn.  There is no wheezing.  He is worried if it could be related to ILD.  Xxxxx[id number]  DEC 2023 Brief History: MR patient for > 18 years. Followed as gold stage 2 copd. but had bird that died 5 years ago. Son had transplant for IPF in [place] 10-15 years ago. She even was in copd trial.  Ratios always obstrucive with fev1 1.4L/67% and current DLCO 10.  Several years ago heard crackles in lung base but CT always read as no ILD and just atelectasis. Now CT says ILD and worse. FVC in 2018 - 2.2-2.38L -> now 2.44 DLC in 2018- 11.5-12.5 -> now 10.8 Does not meet Progr  Phenotype definition for ILD (> 5-10% decline in 1-2 years) Questions:  Does she have ILD  Is this UIP  Would you biopsy OR Rx OR watch?  iscussion synopsis:  2018 -> june 2023: Ches CT bibasalar ILD (TB, GGO, subplerua reticualtion, No HC. No air trapping). Per Poff - not progressing.  in retrospect even in 2018 ILD +.. In 2015: ILA. Definite progression since 2015.  NEW RLL NODULAR OPACITY - June 2023 reported in conference but not in officia report - What is the final conclusion per 2018 ATS/Fleischner Criteria - PROBABLE UIP  - Concordance with official report: DISCORDANT - Conference has picked up a new RLL NODULE  Pathology discussion of biopsy NO:    MDD Impression/Recs: 1) nodule - consier PET/ repeat CT 2) Prob UIP (fam hx,  emphysema, age > 65,s eroplogy) = IPF -> Rx with antifibotic. 3) emphysema + -> currently ILD > emphysema but barely      IMPRESSION: Dominant nodule in the superior segment of the right lower lobe has been present since 2014 consistent with a benign process. Other nodules also demonstrated long-term presence consistent with benign process.   Underlying emphysematous lung changes. Interstitial process also seen as on the prior high-resolution chest CT scan.     Electronically Signed   By: Ranell Bring M.D.   On: 09/05/2022 18:24 OV 09/22/2022  Subjective:  Patient ID: Creston GORMAN Silvan, female , DOB: 1941/09/04 , age 73 y.o. , MRN: [id number] , ADDRESS: [address] [place] [address] PCP Gerome Brunet, DO Patient Care Team: Gerome Brunet, DO as PCP - General (Family Medicine)  This Provider for this visit: Treatment Team:  Attending Provider: Geronimo Amel, MD    09/22/2022 -   Chief Complaint  Patient presents with   Follow-up    Pft review,      HPI Joanette LARRISSA STIVERS 84 y.o. -returns for follow-up.  At this visit she is here with her husband.  He is acting as an independent historian.  She tells me that overall she is stable but she is having chronic cough.  She is quite bothered by the chronic cough.  She tells me that over the last year or 2 it slowly gotten worse but since last visit it is stable.  Nighttime cough is worse than daytime.  It does wake her husband up.  She does bring junk.  It is clear but sometimes slightly yellow-tinged.  There is no wheezing.  Happening despite Breo.  Happening despite stopping fish oil.  Her shortness of breath is stable.  We discussed several things today  -Chronic cough: Okay to restart fish oil but will also check blood allergy panel and CBC with differential -COPD: Is stable.  She will continue inhaler therapy  -Right lower lobe lung nodule: This was parted during the case conference.  We did a super D CT chest and the current radiologist has said it has been stable for 10 years.  No further follow-up  -Pulmonary fibrosis: The  concern at the conference is that she has IPF based on probable UIP on the CT chest, slow emergence and progression and also son having pulmonary fibrosis IPF.  Last serologies showed trace ANA positive but that was 10 years ago.  She is agreeable for repeat.  We discussed therapeutic options including standard of care nintedanib.  She said that many years ago she did have a cardiac cath and she did have a blockage but no stent was placed or angioplasty  was done.  We also discussed standard of care pirfenidone  and the side effects.  We then discussed a modified pirfenidone  through a clinical trial [facility].  The side effect profile here is less but for the first 6 months she could get placebo standard of care pirfenidone  or the study drug.  Then after 6 months should be randomized to different doses of the study drug which research is on show for his one third less side effect profile than standard of care pirfenidone .  She is more interested in this option.SABRA  However we do need to get serologies.   FEB 2025   10/31/2023 -   Chief Complaint  Patient presents with   Hospitalization Follow-up    Hospitalized from 2/12- 10/23/23 new onset AFib. Breathing has improved back to her baseline. She has occ cough with yellow to white sputum.      HPI SHARITA BIENAIME 84 y.o. -returns for follow-up for combined pulmonary fibrosis emphysema.  I last saw her in November 2024.  Then based on husband's story he is an independent historian and also her story and also review of the external medical record she was discharged 10/23/2023 after admission for atrial fibrillation.  She is now on Eliquis  and bisoprolol .  Shortness of breath was worsening before the A-fib admission but is now back to baseline.  Her symptoms are minimal.  Her exercise hypoxemia test is stable.  However she did tell me that she had a COPD flareup while in the hospital.  She now wants antibiotic and prednisone  handy.  I did give her a  prescription for this doxycycline  and short course prednisone  with a refill.  Because of this admission she did not have pulmonary function test but she did have a high-resolution CT chest that I personally visualized and showed it to her and her husband compared to the high-resolution 10 years ago she definitely has frank ILD right now it is probable UIP I agree with the radiologist.  There is progression.  It is still mild in severity and she is not feeling it.  However it is there and it has progressed.  Her son had transplant from fibrosis.  In the past she had declined antifibrotic's but we went over this again.  Went over the inflation reduction act and the total co-pay burden.  Went over pirfenidone  and it side effect profile.  Based on all this she is willing to try the low-dose protocol.  We did discuss the risk benefits and limitations.   Esbriet /Pirfenidone  requires intensive drug monitoring due to high concerns for Adverse effects of , including  Drug Induced Liver Injury, significant GI side effects that include but not limited to Diarrhea, Nausea, Vomiting,  and other system side effects that include Fatigue, headaches, weight loss and other side effects such as skin rash. These will be monitored with  blood work such as LFT initially once a month for 6 months and then quarterly        CT Chest data from date: Jan 2024  - personally visualized and independently interpreted : yes - my findings are: as below Narrative & Impression  CLINICAL DATA:  84 year old female with history of chronic interstitial lung disease.   EXAM: CT CHEST WITHOUT CONTRAST   TECHNIQUE: Multidetector CT imaging of the chest was performed following the standard protocol without intravenous contrast. High resolution imaging of the lungs, as well as inspiratory and expiratory imaging, was performed.   RADIATION DOSE REDUCTION: This exam was performed according  to the departmental dose-optimization  program which includes automated exposure control, adjustment of the mA and/or kV according to patient size and/or use of iterative reconstruction technique.   COMPARISON:  High-resolution chest CT 02/25/2022.   FINDINGS: Cardiovascular: Heart size is normal. There is no significant pericardial fluid, thickening or pericardial calcification. There is aortic atherosclerosis, as well as atherosclerosis of the great vessels of the mediastinum and the coronary arteries, including calcified atherosclerotic plaque in the left main, left anterior descending, left circumflex and right coronary arteries. Calcifications of the aortic valve and mitral annulus.   Mediastinum/Nodes: No pathologically enlarged mediastinal or hilar lymph nodes. Please note that accurate exclusion of hilar adenopathy is limited on noncontrast CT scans. Esophagus is unremarkable in appearance. No axillary lymphadenopathy.   Lungs/Pleura: High-resolution images again demonstrate widespread but patchy areas of ground-glass attenuation, septal thickening, thickening of the peribronchovascular interstitium, cylindrical bronchiectasis and peripheral bronchiolectasis most evident throughout the mid to lower lungs bilaterally. These findings appear mildly progressive compared to the prior examination. No frank honeycombing confidently identified at this time. Inspiratory and expiratory imaging demonstrates some mild air trapping indicative of mild small airways disease. No acute consolidative airspace disease. No pleural effusions. In the superior segment of the right lower lobe (axial image 68 of series 4) there is a 10 x 8 mm pulmonary nodule which is stable compared to prior examinations. No other larger more suspicious appearing pulmonary nodules or masses are noted. Moderate centrilobular and paraseptal emphysema.   Upper Abdomen: Aortic atherosclerosis.   Musculoskeletal: Multiple old healed bilateral rib  fractures. There are no aggressive appearing lytic or blastic lesions noted in the visualized portions of the skeleton.   IMPRESSION: 1. 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 Mild progression of disease compared to the prior study. 2. There is also moderate centrilobular and paraseptal emphysema. 3. Aortic atherosclerosis, in addition to left main and three-vessel coronary artery disease. 4. There are calcifications of the aortic valve and mitral annulus. Echocardiographic correlation for evaluation of potential valvular dysfunction may be warranted if clinically indicated.   Aortic Atherosclerosis (ICD10-I70.0) and Emphysema (ICD10-J43.9).     Electronically Signed   By: Toribio Aye M.D.   On: 10/06/2023 09:42       OV 04/03/2024  Subjective:  Patient ID: Creston GORMAN Silvan, female , DOB: 16-May-1941 , age 43 y.o. , MRN: [id number] , ADDRESS: [address] [place] [address] PCP Gerome Brunet, DO Patient Care Team: Gerome Brunet, DO as PCP - General (Family Medicine) Ladona Heinz, MD as PCP - Cardiology (Cardiology)  This Provider for this visit: Treatment Team:  Attending Provider: Geronimo Amel, MD    04/03/2024 -   Chief Complaint  Patient presents with   Acute Visit    Pt states this is a regular checkup Productive cough for a couple months with green/yellow phlegm. Worst at night.     HPI Lura NICA FRISKE 84 y.o.  acue visit. She had called in for aecopd. Rx with doxuy and pred but not improved. Got CXR - non revelaing. Got sputum culture - growing spedumonas (new first time). Still having lot of cough with yellow bown sputum. Also esbreit low dose protocl and comlaints of tiredness, low motivation , ftiague, dysguesia. Husband repors she coughs till 3am. And is very irritable  PFT stable thugh dlco down Sit  stand test stable Dyspnea stable Cough worse Fatigue  worse       OV 05/01/2024  Subjective:  Patient ID: Creston GORMAN Silvan, female , DOB: 1941-06-02 , age 26 y.o. , MRN: [id number] , ADDRESS: [address] [place] [address] PCP Gerome Brunet, DO Patient Care Team: Gerome Brunet, DO as PCP - General (Family Medicine) Ladona Heinz, MD as PCP - Cardiology (Cardiology)  This Provider for this visit: Treatment Team:  Attending Provider: Geronimo Amel, MD    05/01/2024 -   Chief Complaint  Patient presents with   Medical Management of Chronic Issues   Interstitial Lung Disease    Cough is some better, still has some cough with grey to yellow sputum.      =# HPI FRANCHELLE FOSKETT 84 y.o. -returns for follow-up.  She finished Pseudomonas treatment with Cipro  after having failed doxycycline  before that.  She seemed to get better but then she says she declined.  She is seen nurse practitioner and she had to do another prednisone  course which she just finished a few days ago.  She says that overall she is back to baseline in terms of her shortness of breath her energy levels but the cough is just worse than baseline.  It seems like ever since she had a flareup requiring Doxy and subsequently Cipro  she is never returned to baseline.  The Cipro  only helped transiently.  Nurse practitioner ordered another sputum culture which she is given today for AFB but we will also add a Gram stain and culture for that.  She had another CT scan of the chest that I personally visualized.  Slight increase in tree-in-bud infiltrates for that.  Review of the labs indicate that she has some eosinophilia to 100 cells per cubic millimeter.   CT Chest data from date: *below  - personally visualized and independently interpreted : yes - my findings are: as below  IMPRESSION: 1. Moderate centrilobular emphysema. Worsening diffuse bronchial wall thickening throughout both lower lobes, worse on the  right than the left, which may reflect changes of acute or chronic bronchitis. Superimposed tree-in-bud nodularity dependently within the right lower lobe, as can be seen in small airways infection, inflammation, or aspiration. No lobar pneumonia or pulmonary edema. 2. Underlying changes consistent with a superimposed interstitial lung disease also again noted, unchanged.   Aortic Atherosclerosis (ICD10-I70.0) and Emphysema (ICD10-J43.9).     Electronically Signed   By: Rogelia Myers M.D.   On: 04/25/2024 10:30    OV 09/08/2024  Subjective:  Patient ID: Creston GORMAN Silvan, female , DOB: Sep 07, 1940 , age 58 y.o. 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  09/08/2024 -   Chief Complaint  Patient presents with   Acute Visit    Increased cough x 10 days- prod with yellow to gray sputum.  She started Augmentin  09/05/24.      HPI Darlys GORMAN Silvan 84 y.o. 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  Who presents with worsening sciatica and respiratory symptoms.  She has been experiencing sciatica since before Thanksgiving, which significantly affects her quality of life. Various treatments including prednisone , gabapentin, exercises, and acupuncture have been tried without lasting relief. The pain causes her muscles  to 'lock up' and worsens without regular stretching and exercise.  In addition to sciatica, she has been dealing with respiratory issues. She was prescribed Cipro  in December and is currently on Augmentin , which she believes is helping. She experiences increased phlegm production, particularly at night, leading to more coughing and difficulty sleeping. She uses a hypertonic saline nebulizer and albuterol  twice a day.  She mentions losing half her hair, which she attributes to the numerous medications she is taking. She is not currently on prednisone  but notes that it helps with her cough when she takes it.  Recommended to her and her husband to discuss with primary about sciatica.   The main issues chronic respiratory exacerbation.  And chronic mucopurulent bronchitis this is despite being on optimal inhaler therapy and also recently on Nucala .  She has an upcoming appointment with me September 16, 2024.   SYMPTOM SCALE -  06/01/2020  12/06/2021  05/18/2022 Copd exac  08/01/2022  07/12/2023  10/31/2023  04/03/2024 Esbietr low dose  New pse pseudmonasas  Stopped esbiret after this  Started spirivaa 05/01/2024 Spiriva  breo  O2 use ra ra0 ra ra ra ra ra ra  Shortness of Breath 0 -> 5 scale with 5 being worst (score 6 If unable to do) 0     0 0  At rest 0 0 0 0 0 0 0 0  Simple tasks - showers, clothes change, eating, shaving 0 00 0 0 0 0 0 0  Household (dishes, doing bed, laundry) 0  0 0 0 0 0   Shopping 0 0 0 0 0 0 0 0  Walking level at own pace 0 0 0 0 0 0 0 0  Walking up Stairs 2 2 2 3 3 2 2 2   Total (30-36) Dyspnea Score 2 2 2 3 3 2 2 2   How bad is your cough? 2 3 3  0 3 4 5 3   How bad is your fatigue 0 0 0 00 00 2 1 0  How bad is nausea 00 0 0 0 0 0 0 00  How bad is vomiting?  0 0 0 0 0 0 0 0  How bad is diarrhea? 0 0 0 0 0 0 0 0  How bad is anxiety? 0 0 0 0 0 0 0 0  How bad is depression 0 0 0 0 0 0 0 0  ra ra  Simple office walk 185 feet x  3 laps goal with forehead probe  12/06/2021  12/07/2022  07/12/2023  10/31/2023  04/03/2024   O2 used ra ra ra ra   Number laps completed 3 3 Sit stand x 10-15 Sitt and stand x 15   Comments about pace avg      Resting Pulse Ox/HR 97% and 91/min 100% and HR 72 93% and HR 79 97% and HR 70   Final Pulse Ox/HR 93% and 112/min 94% and HR 101 90% and HR 101 93% and HR 125   Desaturated </= 88% no no  no   Desaturated <= 3% points yes Yes, 6  yes   Got Tachycardic >/= 90/min yes yes  yes  Symptoms at end of test No complaints N compaints  No complaint   Miscellaneous comments x   x        PFT     Latest Ref Rng & Units 03/06/2024    8:06 AM 12/04/2022    8:48 AM 09/22/2022   11:43 AM 08/01/2022    9:17 AM 05/18/2022    3:08 PM 04/06/2020   10:48 AM 07/09/2018   12:49 PM  PFT Results  FVC-Pre L 2.39  2.47  2.40  2.44  2.17  2.38  2.33   FVC-Predicted Pre % 88  90  87  87  78  83  79   Pre FEV1/FVC % % 65  62  59  57  65  65  59   FEV1-Pre L 1.55  1.53  1.41  1.40  1.41  1.55  1.38   FEV1-Predicted Pre % 77  75  69  67  67  72  62   DLCO uncorrected ml/min/mmHg 8.35  11.27  10.61  10.86  10.58  13.11  12.18   DLCO UNC% % 43  57  54  55  54  66  47   DLCO corrected ml/min/mmHg  11.27  10.61  10.86  10.58  13.11    DLCO COR %Predicted %  57  54  55  54  66    DLVA Predicted % 54  65  66  64  63  73  57        LAB RESULTS last 96 hours No results found.       has a past medical history of COPD (chronic obstructive pulmonary disease) (HCC), Coronary artery disease, Diverticulosis, HTN (hypertension), and Lung nodule.   reports that she quit smoking about 12 years ago. Her smoking use included cigarettes. She started smoking about 62 years ago. She has a 37.5 pack-year smoking history. She has never used smokeless tobacco.  Past Surgical History:  Procedure Laterality Date   CARDIAC CATHETERIZATION     CARDIOVERSION N/A 11/28/2023   Procedure: CARDIOVERSION;  Surgeon: Mona Vinie BROCKS, MD;  Location: [facility] IN[facility]ASIVE [facility];  Service: Cardiovascular;  Laterality: N/A;   CORONARY ANGIOPLASTY     FRACTURE SURGERY     INTRAMEDULLARY (IM) NAIL INTERTROCHANTERIC Right 02/26/2022   Procedure: INTRAMEDULLARY (IM) NAIL INTERTROCHANTRIC;  Surgeon: Cristy Bonner DASEN, MD;  Location: [facility] OR;  Service: Orthopedics;  Laterality: Right;   TONSILLECTOMY AND ADENOIDECTOMY      Allergies[1]  Immunization History  Administered Date(s) Administered   Fluad Quad(high Dose 65+) 06/20/2022   INFLUENZA, HIGH DOSE SEASONAL PF 06/05/2016, 06/04/2017, 05/20/2018, 05/06/2019, 05/24/2024   Influenza Split 06/04/2013, 05/05/2014   Influenza Whole 05/28/2012   Influenza, Quadrivalent, Recombinant, Inj, Pf 05/25/2021   Influenza,inj,Quad PF,6+ Mos 06/05/2015   Influenza,trivalent, recombinat, inj, PF 06/19/2023   Moderna Sars-Covid-2 Vaccination 11/10/2019, 12/08/2019, 06/30/2020, 01/11/2021, 08/02/2021   PNEUMOCOCCAL CONJUGATE-20 11/17/2020   Pfizer(Comirnaty)Fall Seasonal Vaccine 12 years and older 06/19/2023   Pneumococcal Conjugate-13 01/26/2015   Pneumococcal Polysaccharide-23 09/04/2005, 09/24/2012   Tdap 03/20/2012    Family History  Problem Relation Age of Onset   COPD Mother    Heart attack Father    Kidney disease Father    Lung cancer Brother 94   Pulmonary fibrosis Son        had lung transplant    Current Medications[2]      Objective:   Vitals:   09/08/24 1616  BP: (!) 90/56  Pulse: 97  Temp: 98.4 F (36.9 C)  TempSrc: Oral  SpO2: 95%  Weight: 142 lb 9.6 oz (64.7 kg)  Height: 5' 5 (1.651 m)    Estimated body mass index is 23.73 kg/m as calculated from the following:   Height as of this encounter: 5' 5 (1.651 m).   Weight as of this encounter: 142 lb 9.6 oz (64.7 kg).  @WEIGHTCHANGE @  [employer]   09/08/24 1616  Weight: 142 lb 9.6 oz (64.7 kg)     Physical Exam   General: No distress. Looks well but I agree she has lost some hair.  Her back is also stiff when she stands up. O2 at  rest: no Cane present: no Sitting in wheel chair: no Frail: non Obese: o Neuro: Alert and Oriented x 3. GCS 15. Speech normal Psych: Pleasant Resp:  Barrel Chest - non.  Wheeze - o, Crackles -some possibly in the base, No overt respiratory distress CVS: Normal heart sounds. Murmurs - no Ext: Stigmata of Connective Tissue Disease - no HEENT: Normal upper airway. PEERL +. No post nasal drip        Assessment/     Assessment & Plan Bronchiectasis with acute exacerbation (HCC)  Chronic cough  Mucopurulent chronic bronchitis (HCC)  Pseudomonas aeruginosa colonization  COPD with acute exacerbation [facility])    PLAN Patient Instructions     ICD-10-CM   1. Bronchiectasis with acute exacerbation (HCC)  J47.1 Ambulatory referral to Pharmacotherapy Clinic    2. Chronic cough  R05.3 Ambulatory referral to Pharmacotherapy Clinic    3. 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 Ambulatory referral to Pharmacotherapy Clinic        -due to Psuedomonas colonization/flare up - did not respond to doxycycline  and only transiently to Cipro  in July 2025 and needed to be on repeat antibiotic early Dec 2025 and again Jan 2026 - having reepated flare up and ongoing sputum production like bronchiectasis patient - stil with flareups despite new Nucala  start   Plan - continue breo for emphysema - continue spiriva  - cotninue 3mL , 3% hypertonic saline twice daily  - cotninue mucinex  - consider flutter valve at next visit - START BRINSUPRI - finish Augmention as called in   - do 5 day  prednisone  taper - do promethamzine cough syrup as neded - consider CT chest if still not better   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  But ever so slightly since 2015 it is slowly gotten worse.  Continuied worsening JAn 2025 CT chest At conference the concern is that this Is IPF .   -Did not qualify for clinical trial on account of combined emphysema  -  DID NOT TOLERATE Esbriet  low dose protocol due to side effects of fatigue and altered tase   Plan -- supportive care for now   RLL lung ndoule - dec 2023 =- sstable x 10 days; none reported summer 2025  - stable x 10 years  Plan  - no  further followup  Plan  Return Sep 16, 2024 to see Dr Geronimo if not btter    FOLLOWUP    Return for Return Sep 16, 2024 to see Dr Geronimo if not btter.    SIGNATURE    Dr. Dorethia Geronimo, M.D., F.C.C.P,  Pulmonary and Critical Care Medicine Staff Physician, [facility] Center Director - Interstitial Lung Disease  Program  Pulmonary Fibrosis [facility] at [facility] [place], [place], [address]  Pager: [phone], If no answer or between  15:00h - 7:00h: call [phone] Telephone: [phone]  4:57 PM 09/08/2024     [1]  Allergies Allergen Reactions   Simvastatin  Other (See Comments)    Severe myalgia eve3n with 10 mg daily   Digoxin  And Related Other (See Comments)    Red palms   Esbriet  [Pirfenidone ]     Fatigue, loss of taste   Metoprolol  Tartrate     Other Reaction(s): 2nd degree HB  [2]  Current Outpatient Medications:    predniSONE  (DELTASONE ) 10 MG tablet, Please take prednisone  40 mg x1 day, then 30 mg x1 day, then 20 mg x1 day, then 10 mg x1 day, and then 5 mg x1 day and stop, Disp: 11 tablet, Rfl: 0   albuterol  (PROVENTIL ) (2.5  MG/3ML) 0.083% nebulizer solution, Take 3 mLs (2.5 mg total) by nebulization every 6 (six) hours as needed for wheezing or shortness of breath., Disp: 540 mL, Rfl: 3   amoxicillin -clavulanate (AUGMENTIN ) 875-125 MG tablet, Take 1 tablet by mouth 2 (two) times daily., Disp: 20 tablet, Rfl: 0   apixaban  (ELIQUIS ) 5 MG TABS tablet, Take 1 tablet (5 mg total) by mouth 2 (two) times daily., Disp: 180 tablet, Rfl: 0   Ascorbic Acid (VITAMIN C) 1000 MG tablet, Take 1,000 mg by mouth daily., Disp: , Rfl:    b complex vitamins tablet, Take 2 tablets by mouth daily., Disp: , Rfl:    BREO ELLIPTA  100-25 MCG/ACT AEPB, inhale ONE PUFF into THE lungs EVERY DAY, Disp: 60 each, Rfl: 11   calcium -vitamin D  250-100 MG-UNIT per tablet, Take 1 tablet by mouth daily. , Disp: , Rfl:    docusate sodium  (COLACE) 100 MG capsule, Take 1 capsule (100 mg total) by mouth 2 (two) times daily., Disp: 10 capsule, Rfl: 0   furosemide  (LASIX ) 40 MG tablet, TAKE 1 TABLET BY MOUTH EVERY DAY AS NEEDED, Disp: 90 tablet, Rfl: 1   glucosamine-chondroitin 500-400 MG tablet, Take 1 tablet by mouth every morning., Disp: , Rfl:    guaiFENesin  (MUCINEX ) 600 MG 12 hr tablet, Take 600 mg by mouth 2 (two) times daily., Disp: , Rfl:    JARDIANCE  10 MG TABS tablet, Take 1 tablet (10 mg total) by mouth daily before breakfast., Disp: 30 tablet, Rfl: 11   LUTEIN PO, Take 1 tablet by mouth daily., Disp: , Rfl:    magnesium  30 MG tablet, Take 30 mg by mouth daily in the afternoon., Disp: , Rfl:    Melatonin 10 MG TABS, Take 10 mg by mouth at bedtime., Disp: , Rfl:    Mepolizumab  (NUCALA ) 100 MG/ML SOAJ, Inject 1 mL (100 mg total) into the skin every 28 (twenty-eight) days., Disp: 1 mL, Rfl: 3   omeprazole (PRILOSEC) 20 MG capsule, Take 20 mg by mouth daily., Disp: , Rfl:    OVER THE COUNTER MEDICATION, Take 2 mg by mouth daily in the afternoon. Biotin 2 mg, Disp: , Rfl:    OVER THE COUNTER MEDICATION, Place  1 drop into both eyes daily as needed (dry  eye). OTC eye drop, Disp: , Rfl:    PAPAYA PO, Take 3 tablets by mouth daily as needed (heartburn)., Disp: , Rfl:    potassium chloride  SA (KLOR-CON  M) 20 MEQ tablet, TAKE 2 TABLETS (40 MEQ TOTAL) BY MOUTH DAILY AS NEEDED (WHEN YOU TAKE FUROSEMIDE )., Disp: 180 tablet, Rfl: 1   promethazine -dextromethorphan (PROMETHAZINE -DM) 6.25-15 MG/5ML syrup, Take 5 mLs by mouth 4 (four) times daily as needed for cough., Disp: 180 mL, Rfl: 0   Red Yeast Rice 600 MG TABS, Take 2,200 mg by mouth every evening., Disp: , Rfl:    selenium 50 MCG TABS, Take 50 mcg by mouth daily., Disp: , Rfl:    sodium chloride  HYPERTONIC 3 % nebulizer solution, 3 ML IN NEB TWICE DAILY DX J84.9 90 day supply, Disp: 540 mL, Rfl: 3   sotalol  (BETAPACE ) 80 MG tablet, Take 1 tablet (80 mg total) by mouth daily., Disp: 90 tablet, Rfl: 1   Tiotropium Bromide  Monohydrate (SPIRIVA  RESPIMAT) 2.5 MCG/ACT AERS, INHALE TWO PUFFS into THE lungs EVERY DAY, Disp: 12 g, Rfl: 3   valsartan  (DIOVAN ) 40 MG tablet, Take 1 tablet (40 mg total) by mouth every evening., Disp: 90 tablet, Rfl: 1   vitamin B-12 (CYANOCOBALAMIN) 1000 MCG tablet, Take 1,000 mcg by mouth daily., Disp: , Rfl:    vitamin E 200 UNIT capsule, Take 200 Units by mouth daily., Disp: , Rfl:   "

## 2024-09-12 ENCOUNTER — Telehealth: Payer: Self-pay

## 2024-09-12 NOTE — Telephone Encounter (Signed)
 Received Brinsupri new start paperwork. Opening benefits investigation in this thread, updates to follow.

## 2024-09-15 LAB — ACID-FAST (MYCOBACTERIA) SMEAR AND CULTURE WITH REFLEX TO IDENTIFICATION
Acid Fast Culture: NEGATIVE
Acid Fast Smear: NEGATIVE

## 2024-09-16 ENCOUNTER — Encounter: Payer: Self-pay | Admitting: Internal Medicine

## 2024-09-16 ENCOUNTER — Ambulatory Visit: Admitting: Internal Medicine

## 2024-09-16 VITALS — BP 88/52 | HR 74 | Ht 65.0 in | Wt 144.0 lb

## 2024-09-16 DIAGNOSIS — J849 Interstitial pulmonary disease, unspecified: Secondary | ICD-10-CM | POA: Diagnosis not present

## 2024-09-16 DIAGNOSIS — J479 Bronchiectasis, uncomplicated: Secondary | ICD-10-CM

## 2024-09-16 DIAGNOSIS — J449 Chronic obstructive pulmonary disease, unspecified: Secondary | ICD-10-CM

## 2024-09-16 DIAGNOSIS — R053 Chronic cough: Secondary | ICD-10-CM

## 2024-09-16 DIAGNOSIS — D721 Eosinophilia, unspecified: Secondary | ICD-10-CM | POA: Diagnosis not present

## 2024-09-16 DIAGNOSIS — J439 Emphysema, unspecified: Secondary | ICD-10-CM

## 2024-09-16 DIAGNOSIS — J8410 Pulmonary fibrosis, unspecified: Secondary | ICD-10-CM

## 2024-09-16 DIAGNOSIS — Z836 Family history of other diseases of the respiratory system: Secondary | ICD-10-CM

## 2024-09-16 DIAGNOSIS — J411 Mucopurulent chronic bronchitis: Secondary | ICD-10-CM

## 2024-09-16 DIAGNOSIS — Z2239 Carrier of other specified bacterial diseases: Secondary | ICD-10-CM

## 2024-09-16 NOTE — Patient Instructions (Addendum)
"    ICD-10-CM   1. Bronchiectasis without complication (HCC)  J47.9 Ambulatory Referral for DME    2. Chronic cough  R05.3     3. Mucopurulent chronic bronchitis (HCC)  J41.1     4. Pseudomonas aeruginosa colonization  Z22.39          -due to Psuedomonas colonization/flare up -  - having reepated flare up and ongoing sputum production like bronchiectasis patient - stil with flareups despite new Nucala  start - Brinsupri start pneing  - on 09/16/2024= 0 improved but still with 15-30cc baseline green sputum  pre days despite fluter valve   Plan - continue breo for emphysema - continue spiriva  - cotninue 3mL , 3% hypertonic saline twice daily  - cotninue mucinex  - cotinnnnue flutter valve at next visit - START BRINSUPRI - AWAIT START START  VIBRATORY VEST (failed flutter valve)   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  But ever so slightly since 2015 it is slowly gotten worse.  Continuied worsening JAn 2025 CT chest At conference the concern is that this Is IPF .   -Did not qualify for clinical trial on account of combined emphysema  -  DID NOT TOLERATE Esbriet  low dose protocol due to side effects of fatigue and altered tase   Plan -- supportive care for now - do spirometry and dlco in  3months ; JAscayd if there is progression   RLL lung ndoule - dec 2023 =- sstable x 10 days; none reported summer 2025  - stable x 10 years  Plan  - no  further followup  Plan 3 months or sooner if  needed "

## 2024-09-17 NOTE — Telephone Encounter (Signed)
 Submitted a Prior Authorization request to [employer] for BRINSUPRI via CoverMyMeds. Will update once we receive a response.  Key: [id number]

## 2024-09-23 ENCOUNTER — Encounter: Payer: Self-pay | Admitting: Cardiology

## 2024-09-25 ENCOUNTER — Other Ambulatory Visit (HOSPITAL_COMMUNITY): Payer: Self-pay

## 2024-09-25 NOTE — Telephone Encounter (Unsigned)
 Received a fax regarding Prior Authorization from [facility] MEDICARE for BRINSUPRI. Authorization has been DENIED because must clarify if pt's diagnosis includes non-cystic fibrosis bronchiectasis.  Phone# [phone]  Submitted urgent appeal to [facility] [place] via fax. Will update when we receive a response.  Fax #: [phone]

## 2024-09-30 ENCOUNTER — Other Ambulatory Visit: Payer: Self-pay | Admitting: Internal Medicine

## 2024-09-30 ENCOUNTER — Other Ambulatory Visit: Payer: Self-pay

## 2024-09-30 DIAGNOSIS — J4489 Other specified chronic obstructive pulmonary disease: Secondary | ICD-10-CM

## 2024-10-01 ENCOUNTER — Other Ambulatory Visit: Payer: Self-pay

## 2024-10-02 ENCOUNTER — Other Ambulatory Visit: Payer: Self-pay

## 2024-10-02 ENCOUNTER — Ambulatory Visit: Attending: Internal Medicine

## 2024-10-02 DIAGNOSIS — J449 Chronic obstructive pulmonary disease, unspecified: Secondary | ICD-10-CM

## 2024-10-02 DIAGNOSIS — J4489 Other specified chronic obstructive pulmonary disease: Secondary | ICD-10-CM

## 2024-10-02 MED ORDER — NUCALA 100 MG/ML ~~LOC~~ SOAJ
100.0000 mg | SUBCUTANEOUS | 3 refills | Status: AC
  Filled 2024-10-02: qty 1, 28d supply, fill #0

## 2024-10-02 NOTE — Telephone Encounter (Signed)
 Please see pharmacotherapy visit 10/02/24. Nucala  refill completed within this encounter.

## 2024-10-02 NOTE — Progress Notes (Signed)
 [facility]  Referring Provider: Dr. Geronimo  Virtual Visit via Telephone Note  I connected with Erin Li on 10/02/24 at 11:00 AM EST by telephone and verified that I am speaking with the correct person using two identifiers.  Location: Patient: home Provider: office   I discussed the limitations, risks, security and privacy concerns of performing an evaluation and management service by telephone and the availability of in person appointments. I also discussed with the patient that there may be a patient responsible charge related to this service. The patient expressed understanding and agreed to proceed.   HPI: Erin Li is a 84 y.o. female who presents to the pharmacotherapy clinic via telephone for follow-up Nucala  counseling. Refill is due and copay has increased since the new year (2026).  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, 41-49%) (HCC) 10/22/2023   Atherosclerosis of native coronary artery of native heart without angina pectoris 10/22/2023   ILD (interstitial lung disease) (HCC) 10/22/2023   Acute respiratory failure with hypoxia (HCC) 10/20/2023   Persistent atrial fibrillation (HCC) 10/17/2023   HLD (hyperlipidemia) 02/26/2022   Essential hypertension 02/26/2022   Femur fracture, right (HCC) 02/25/2022   Pain in right foot 12/10/2019   Elevated C-reactive protein (CRP) 01/18/2017   COPD with acute exacerbation (HCC) 09/25/2012   Lung nodule 07/14/2012   Hx of smoking 07/14/2012    Patient's Medications  New Prescriptions   No medications on file  Previous Medications   ALBUTEROL  (PROVENTIL ) (2.5 MG/3ML) 0.083% NEBULIZER SOLUTION    Take 3 mLs (2.5 mg total) by nebulization every 6 (six) hours as needed for wheezing or shortness of breath.   APIXABAN  (ELIQUIS ) 5 MG  TABS TABLET    Take 1 tablet (5 mg total) by mouth 2 (two) times daily.   ASCORBIC ACID (VITAMIN C) 1000 MG TABLET    Take 1,000 mg by mouth daily.   B COMPLEX VITAMINS TABLET    Take 2 tablets by mouth daily.   BREO ELLIPTA  100-25 MCG/ACT AEPB    inhale ONE PUFF into THE lungs EVERY DAY   CALCIUM -VITAMIN D  250-100 MG-UNIT PER TABLET    Take 1 tablet by mouth daily.    DOCUSATE SODIUM  (COLACE) 100 MG CAPSULE    Take 1 capsule (100 mg total) by mouth 2 (two) times daily.   FUROSEMIDE  (LASIX ) 40 MG TABLET    TAKE 1 TABLET BY MOUTH EVERY DAY AS NEEDED   GLUCOSAMINE-CHONDROITIN 500-400 MG TABLET    Take 1 tablet by mouth every morning.   GUAIFENESIN  (MUCINEX ) 600 MG 12 HR TABLET    Take 600 mg by mouth 2 (two) times daily.   JARDIANCE  10 MG TABS TABLET    Take 1 tablet (10 mg total) by mouth daily before breakfast.   LUTEIN PO    Take 1 tablet by mouth daily.   MAGNESIUM  30 MG TABLET    Take 30 mg by mouth daily in the afternoon.   MELATONIN 10 MG TABS    Take 10 mg by mouth at bedtime.   OMEPRAZOLE (PRILOSEC) 20 MG CAPSULE    Take 20 mg by mouth daily.   OVER THE COUNTER MEDICATION    Take 2 mg by mouth daily in the afternoon. Biotin 2 mg   OVER THE COUNTER MEDICATION    Place 1 drop into both eyes  daily as needed (dry eye). OTC eye drop   PAPAYA PO    Take 3 tablets by mouth daily as needed (heartburn).   POTASSIUM CHLORIDE  SA (KLOR-CON  M) 20 MEQ TABLET    TAKE 2 TABLETS (40 MEQ TOTAL) BY MOUTH DAILY AS NEEDED (WHEN YOU TAKE FUROSEMIDE ).   PROMETHAZINE -DEXTROMETHORPHAN (PROMETHAZINE -DM) 6.25-15 MG/5ML SYRUP    Take 5 mLs by mouth 4 (four) times daily as needed for cough.   RED YEAST RICE 600 MG TABS    Take 2,200 mg by mouth every evening.   SELENIUM 50 MCG TABS    Take 50 mcg by mouth daily.   SODIUM CHLORIDE  HYPERTONIC 3 % NEBULIZER SOLUTION    3 ML IN NEB TWICE DAILY DX J84.9 90 day supply   SOTALOL  (BETAPACE ) 80 MG TABLET    Take 1 tablet (80 mg total) by mouth daily.   TIOTROPIUM BROMIDE   MONOHYDRATE (SPIRIVA  RESPIMAT) 2.5 MCG/ACT AERS    INHALE TWO PUFFS into THE lungs EVERY DAY   VALSARTAN  (DIOVAN ) 40 MG TABLET    Take 1 tablet (40 mg total) by mouth every evening.   VITAMIN B-12 (CYANOCOBALAMIN) 1000 MCG TABLET    Take 1,000 mcg by mouth daily.   VITAMIN E 200 UNIT CAPSULE    Take 200 Units by mouth daily.  Modified Medications   Modified Medication Previous Medication   MEPOLIZUMAB  (NUCALA ) 100 MG/ML SOAJ Mepolizumab  (NUCALA ) 100 MG/ML SOAJ      Inject 1 mL (100 mg total) into the skin every 28 (twenty-eight) days.    Inject 1 mL (100 mg total) into the skin every 28 (twenty-eight) days.  Discontinued Medications   No medications on file    Allergies: Allergies[1]  Past Medical History: Past Medical History:  Diagnosis Date   COPD (chronic obstructive pulmonary disease) (HCC)    Coronary artery disease    Diverticulosis    HTN (hypertension)    Lung nodule     Social History: Social History   Socioeconomic History   Marital status: Married    Spouse name: Retail Buyer   Number of children: 4   Years of education: Not on file   Highest education level: High school graduate  Occupational History   Occupation: Retired  Tobacco Use   Smoking status: Former    Current packs/day: 0.00    Average packs/day: 0.8 packs/day for 50.0 years (37.5 ttl pk-yrs)    Types: Cigarettes    Start date: 11/10/1961    Quit date: 11/11/2011    Years since quitting: 12.9   Smokeless tobacco: Never   Tobacco comments:    Encouraged to remain smoke free  Vaping Use   Vaping status: Never Used  Substance and Sexual Activity   Alcohol use: Yes    Alcohol/week: 5.0 standard drinks of alcohol    Types: 5 Glasses of wine per week    Comment: weekends   Drug use: Not on file   Sexual activity: Not on file  Other Topics Concern   Not on file  Social History Narrative   Not on file   Social Drivers of Health   Tobacco Use: Medium Risk (09/16/2024)   Patient History     Smoking Tobacco Use: Former    Smokeless Tobacco Use: Never    Passive Exposure: Not on file  Financial Resource Strain: Low Risk (10/22/2023)   Overall Financial Resource Strain (CARDIA)    Difficulty of Paying Living Expenses: Not very hard  Food Insecurity: No Food Insecurity (10/24/2023)  Hunger Vital Sign    Worried About Running Out of Food in the Last Year: Never true    Ran Out of Food in the Last Year: Never true  Transportation Needs: No Transportation Needs (10/24/2023)   PRAPARE - Administrator, Civil Service (Medical): No    Lack of Transportation (Non-Medical): No  Physical Activity: Not on file  Stress: Not on file  Social Connections: Socially Integrated (10/17/2023)   Social Connection and Isolation Panel    Frequency of Communication with Friends and Family: More than three times a week    Frequency of Social Gatherings with Friends and Family: More than three times a week    Attends Religious Services: More than 4 times per year    Active Member of [employer] or Organizations: Yes    Attends Banker Meetings: Patient declined    Marital Status: Married  Depression (PHQ2-9): Not on file  Alcohol Screen: Not on file  Housing: Unknown (10/24/2023)   Housing Stability Vital Sign    Unable to Pay for Housing in the Last Year: No    Number of Times Moved in the Last Year: Not on file    Homeless in the Last Year: No  Utilities: Not At Risk (10/24/2023)   [employer] Utilities    Threatened with loss of utilities: No  Health Literacy: Not on file    Medication: Nucala  100mg  [place] every 28 days  Assessment: Patient continues on Nucala  for COPD and denies side effects or concern with medication.   Test claim reveals copay $1,111.21  Discussed options for affordability, including M3P or waiting for grant. Lorrene is currently not available.   Next dose due 10/16/24.   She opts to wait for grant. Not interested in M3P at this time.  Plan: - Pharmacy team  will reach out to patient prior to next dose to coordinate giving her a sample to avoid missed dose - Awaiting grant opening to assist with cost.  - CONTINUE Nucala  100mg  [place] every 28 days  I discussed the assessment and treatment plan with the patient. The patient was provided an opportunity to ask questions and all were answered. The patient agreed with the plan and demonstrated an understanding of the instructions.   The patient was advised to call back or seek an in-person evaluation if the symptoms worsen or if the condition fails to improve as anticipated.  I provided 10 minutes of non-face-to-face time during this encounter.  Aleck Puls, PharmD, BCPS, CPP Clinical Pharmacist  [facility]  [facility]     [1]  Allergies Allergen Reactions   Simvastatin  Other (See Comments)    Severe myalgia eve3n with 10 mg daily   Digoxin  And Related Other (See Comments)    Red palms   Esbriet  [Pirfenidone ]     Fatigue, loss of taste   Metoprolol  Tartrate     Other Reaction(s): 2nd degree HB

## 2024-10-02 NOTE — Progress Notes (Signed)
 Per test claim, copay: $1,111.21  Spoke to patient to discuss affordability options. She declines M3P and prefers to wait on grant to open for COPD.   Next dose due 2/12. Chasadee or Aleck will call about 5 days prior to update on grant status and/or to coordinate a sample to avoid missed dose.   Patient verbalizes understanding and agreement with plan.

## 2024-10-03 ENCOUNTER — Telehealth: Payer: Self-pay | Admitting: Internal Medicine

## 2024-10-03 NOTE — Telephone Encounter (Signed)
 I have placed a document in your mailbox for this pt that is from [employer]- if you could addend the notes and sign and date document and return to [facility] mailbox. Thanks!

## 2024-10-03 NOTE — Telephone Encounter (Signed)
 I gave to [employer]

## 2024-10-03 NOTE — Telephone Encounter (Signed)
 updated

## 2024-10-03 NOTE — Telephone Encounter (Signed)
 If you could just please sign the document and date it and place in [facility] mailbox at the front desk I will send out to DME. Thanks!

## 2024-10-07 NOTE — Telephone Encounter (Signed)
 NFN

## 2024-10-09 ENCOUNTER — Other Ambulatory Visit: Payer: Self-pay

## 2024-10-09 ENCOUNTER — Other Ambulatory Visit (HOSPITAL_COMMUNITY): Payer: Self-pay

## 2024-10-09 NOTE — Telephone Encounter (Signed)
 Called [employer] at [phone] for update on appeal. Per rep appeal for BRINSUPRI was OVERTURNED. They are faxing approval letter with dates.  Per test claim copay for 30 day supply is $1396.73. Pt can fill through [facility]: [phone].  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 May have to fill Brinsupri twice and use Nucala  sample until oop max is met.

## 2025-01-06 ENCOUNTER — Encounter

## 2025-01-06 ENCOUNTER — Ambulatory Visit: Admitting: Internal Medicine
